# Patient Record
Sex: Female | Born: 1945
Health system: Southern US, Community
[De-identification: ages and names within clinical notes are randomized; demographics above are authoritative.]

## PROBLEM LIST (undated history)

## (undated) DIAGNOSIS — C259 Malignant neoplasm of pancreas, unspecified: Secondary | ICD-10-CM

## (undated) DIAGNOSIS — R112 Nausea with vomiting, unspecified: Secondary | ICD-10-CM

## (undated) DIAGNOSIS — T4145XA Adverse effect of unspecified anesthetic, initial encounter: Secondary | ICD-10-CM

## (undated) DIAGNOSIS — E119 Type 2 diabetes mellitus without complications: Secondary | ICD-10-CM

## (undated) DIAGNOSIS — C7800 Secondary malignant neoplasm of unspecified lung: Secondary | ICD-10-CM

## (undated) DIAGNOSIS — Z8719 Personal history of other diseases of the digestive system: Secondary | ICD-10-CM

## (undated) DIAGNOSIS — I10 Essential (primary) hypertension: Secondary | ICD-10-CM

## (undated) DIAGNOSIS — T8859XA Other complications of anesthesia, initial encounter: Secondary | ICD-10-CM

## (undated) DIAGNOSIS — Z8711 Personal history of peptic ulcer disease: Secondary | ICD-10-CM

## (undated) DIAGNOSIS — N390 Urinary tract infection, site not specified: Secondary | ICD-10-CM

## (undated) DIAGNOSIS — Z9889 Other specified postprocedural states: Secondary | ICD-10-CM

## (undated) HISTORY — DX: Personal history of peptic ulcer disease: Z87.11

## (undated) HISTORY — DX: Secondary malignant neoplasm of unspecified lung: C78.00

## (undated) HISTORY — PX: BUNIONECTOMY: SHX129

## (undated) HISTORY — DX: Personal history of other diseases of the digestive system: Z87.19

## (undated) HISTORY — PX: DENTAL SURGERY: SHX609

## (undated) HISTORY — DX: Malignant neoplasm of pancreas, unspecified: C25.9

## (undated) HISTORY — DX: Urinary tract infection, site not specified: N39.0

## (undated) HISTORY — DX: Essential (primary) hypertension: I10

---

## 2012-01-01 LAB — HM DEXA SCAN

## 2012-01-01 LAB — HM MAMMOGRAPHY: HM Mammogram: NORMAL

## 2013-10-18 ENCOUNTER — Telehealth: Payer: Self-pay

## 2013-10-18 NOTE — Telephone Encounter (Signed)
Left message for call back Non identifiable  

## 2013-10-19 ENCOUNTER — Ambulatory Visit (INDEPENDENT_AMBULATORY_CARE_PROVIDER_SITE_OTHER): Payer: Medicare Other | Admitting: Family Medicine

## 2013-10-19 ENCOUNTER — Encounter: Payer: Self-pay | Admitting: Family Medicine

## 2013-10-19 VITALS — BP 148/84 | HR 82 | Temp 98.2°F | Resp 16 | Ht 60.0 in | Wt 150.2 lb

## 2013-10-19 DIAGNOSIS — IMO0001 Reserved for inherently not codable concepts without codable children: Secondary | ICD-10-CM

## 2013-10-19 DIAGNOSIS — G8929 Other chronic pain: Secondary | ICD-10-CM

## 2013-10-19 DIAGNOSIS — K219 Gastro-esophageal reflux disease without esophagitis: Secondary | ICD-10-CM

## 2013-10-19 DIAGNOSIS — R030 Elevated blood-pressure reading, without diagnosis of hypertension: Secondary | ICD-10-CM

## 2013-10-19 DIAGNOSIS — M549 Dorsalgia, unspecified: Secondary | ICD-10-CM

## 2013-10-19 LAB — BASIC METABOLIC PANEL
BUN: 18 mg/dL (ref 6–23)
CO2: 27 mEq/L (ref 19–32)
Calcium: 9.6 mg/dL (ref 8.4–10.5)
Chloride: 106 mEq/L (ref 96–112)
Creatinine, Ser: 0.6 mg/dL (ref 0.4–1.2)
GFR: 119.24 mL/min (ref >60.00)
Glucose, Bld: 98 mg/dL (ref 70–99)
Potassium: 3.7 mEq/L (ref 3.5–5.1)
Sodium: 141 mEq/L (ref 135–145)

## 2013-10-19 LAB — CBC WITH DIFFERENTIAL/PLATELET
Basophils Relative: 0.4 % (ref 0.0–3.0)
Eosinophils Relative: 0.9 % (ref 0.0–5.0)
HCT: 37.8 % (ref 36.0–46.0)
Hemoglobin: 12.4 g/dL (ref 12.0–15.0)
Lymphocytes Relative: 39.7 % (ref 12.0–46.0)
Lymphs Abs: 3.5 10*3/uL (ref 0.7–4.0)
MCHC: 32.8 g/dL (ref 30.0–36.0)
Monocytes Relative: 9.6 % (ref 3.0–12.0)
Neutrophils Relative %: 49.4 % (ref 43.0–77.0)
Platelets: 292 10*3/uL (ref 150.0–400.0)
RBC: 5.1 Mil/uL (ref 3.87–5.11)
WBC: 8.8 10*3/uL (ref 4.5–10.5)

## 2013-10-19 LAB — LIPID PANEL
HDL: 45.6 mg/dL (ref >39.00)
Triglycerides: 223 mg/dL — ABNORMAL HIGH (ref 0.0–149.0)
VLDL: 44.6 mg/dL — ABNORMAL HIGH (ref 0.0–40.0)

## 2013-10-19 LAB — HEPATIC FUNCTION PANEL
ALT: 26 U/L (ref 0–35)
AST: 24 U/L (ref 0–37)
Albumin: 4.1 g/dL (ref 3.5–5.2)
Alkaline Phosphatase: 89 U/L (ref 39–117)
Total Bilirubin: 0.5 mg/dL (ref 0.3–1.2)

## 2013-10-19 LAB — LDL CHOLESTEROL, DIRECT: Direct LDL: 223.9 mg/dL

## 2013-10-19 LAB — H. PYLORI ANTIBODY, IGG: H Pylori IgG: NEGATIVE

## 2013-10-19 MED ORDER — PANTOPRAZOLE SODIUM 40 MG PO TBEC: 40.0000 mg | DELAYED_RELEASE_TABLET | Freq: Every day | ORAL | Status: DC
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
## 2013-10-19 NOTE — Progress Notes (Signed)
Pre visit review using our clinic review tool, if applicable. No additional management support is needed unless otherwise documented below in the visit note. 

## 2013-10-19 NOTE — Telephone Encounter (Signed)
Unable to reach pre visit.  
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
## 2013-10-19 NOTE — Assessment & Plan Note (Signed)
New.  Start Protonix daily to improve sxs.  Reviewed lifestyle modifications- particularly avoidance of eating and then lying down.  Check H pylori and tx prn.

## 2013-10-19 NOTE — Assessment & Plan Note (Signed)
New.  Pt's BP today places her in hypertensive range but it is first visit and pt admits to being nervous.  Will not label her hypertensive based on 1 reading but will follow closely and if again elevated, will have low threshold for starting meds.  Reviewed supportive care and red flags that should prompt return.  Pt expressed understanding and is in agreement w/ plan.

## 2013-10-19 NOTE — Patient Instructions (Signed)
Schedule your complete physical in 6 months We'll notify you of your lab results and make any changes if needed We'll call you with your Ortho appt Start the protonix daily for the reflux Call with any questions or concerns Happy Holidays!

## 2013-10-20 ENCOUNTER — Other Ambulatory Visit: Payer: Self-pay | Admitting: General Practice

## 2013-10-20 DIAGNOSIS — E785 Hyperlipidemia, unspecified: Secondary | ICD-10-CM

## 2013-10-20 MED ORDER — ROSUVASTATIN CALCIUM 20 MG PO TABS: 20.0000 mg | ORAL_TABLET | Freq: Every day | ORAL | Status: DC

## 2013-10-24 ENCOUNTER — Encounter: Payer: Self-pay | Admitting: General Practice

## 2013-11-28 ENCOUNTER — Encounter: Payer: Self-pay | Admitting: Family Medicine

## 2013-11-28 ENCOUNTER — Ambulatory Visit (INDEPENDENT_AMBULATORY_CARE_PROVIDER_SITE_OTHER): Payer: Medicare Other | Admitting: Family Medicine

## 2013-11-28 VITALS — BP 140/84 | HR 94 | Temp 98.2°F | Resp 16 | Wt 150.4 lb

## 2013-11-28 DIAGNOSIS — J309 Allergic rhinitis, unspecified: Secondary | ICD-10-CM

## 2013-11-28 NOTE — Patient Instructions (Signed)
Follow up as needed Drink plenty of fluids Use a humidifier at night Start OTC Claritin or Zyrtec daily Add Nasacort- 2 sprays each nostril daily- also OTC Call the housing authority and report the mold Call with any questions or concerns Hang in there!

## 2013-11-28 NOTE — Progress Notes (Signed)
   Subjective:    Patient ID: Jacqueline Hahn, female    DOB: 26-Nov-1945, 68 y.o.   MRN: [id number]  HPI Allergic rhinitis- + nasal congestion, bilateral ear fullness, sore throat at night, PND.  No fevers.  Waking very dry.  No hx of allergies.  Now w/ mildew exposure at home- sxs only occur when at home.   Review of Systems For ROS see HPI     Objective:   Physical Exam  Vitals reviewed. Constitutional: She appears well-developed and well-nourished. No distress.  HENT:  Head: Normocephalic and atraumatic.  Right Ear: Tympanic membrane normal.  Left Ear: Tympanic membrane normal.  Nose: Mucosal edema and rhinorrhea present. Right sinus exhibits no maxillary sinus tenderness and no frontal sinus tenderness. Left sinus exhibits no maxillary sinus tenderness and no frontal sinus tenderness.  Mouth/Throat: Mucous membranes are normal. Posterior oropharyngeal erythema (w/ PND) present.  Eyes: Conjunctivae and EOM are normal. Pupils are equal, round, and reactive to light.  Neck: Normal range of motion. Neck supple.  Cardiovascular: Normal rate, regular rhythm and normal heart sounds.   Pulmonary/Chest: Effort normal and breath sounds normal. No respiratory distress. She has no wheezes. She has no rales.  Lymphadenopathy:    She has no cervical adenopathy.          Assessment & Plan:

## 2013-11-28 NOTE — Progress Notes (Signed)
Pre visit review using our clinic review tool, if applicable. No additional management support is needed unless otherwise documented below in the visit note. 

## 2013-11-28 NOTE — Assessment & Plan Note (Signed)
New.  sxs and PE consistent w/ allergy exposure.  Start OTC antihistamine and nasal steroid.  Reviewed supportive care and red flags that should prompt return.  Pt expressed understanding and is in agreement w/ plan.

## 2014-01-02 ENCOUNTER — Encounter: Payer: Self-pay | Admitting: Family Medicine

## 2014-01-18 ENCOUNTER — Encounter (HOSPITAL_COMMUNITY): Payer: Self-pay | Admitting: Emergency Medicine

## 2014-01-18 ENCOUNTER — Emergency Department (HOSPITAL_COMMUNITY): Payer: Medicare Other

## 2014-01-18 ENCOUNTER — Emergency Department (HOSPITAL_COMMUNITY)
Admission: EM | Admit: 2014-01-18 | Discharge: 2014-01-18 | Disposition: A | Discharge status: Home / Self Care | Payer: Medicare Other | Attending: Emergency Medicine | Admitting: Emergency Medicine

## 2014-01-18 DIAGNOSIS — S99919A Unspecified injury of unspecified ankle, initial encounter: Secondary | ICD-10-CM

## 2014-01-18 DIAGNOSIS — S99929A Unspecified injury of unspecified foot, initial encounter: Secondary | ICD-10-CM

## 2014-01-18 DIAGNOSIS — M549 Dorsalgia, unspecified: Secondary | ICD-10-CM

## 2014-01-18 DIAGNOSIS — Z8719 Personal history of other diseases of the digestive system: Secondary | ICD-10-CM | POA: Insufficient documentation

## 2014-01-18 DIAGNOSIS — S8991XA Unspecified injury of right lower leg, initial encounter: Secondary | ICD-10-CM

## 2014-01-18 DIAGNOSIS — W19XXXA Unspecified fall, initial encounter: Secondary | ICD-10-CM

## 2014-01-18 DIAGNOSIS — W06XXXA Fall from bed, initial encounter: Secondary | ICD-10-CM | POA: Insufficient documentation

## 2014-01-18 DIAGNOSIS — Y9389 Activity, other specified: Secondary | ICD-10-CM | POA: Insufficient documentation

## 2014-01-18 DIAGNOSIS — I10 Essential (primary) hypertension: Secondary | ICD-10-CM | POA: Insufficient documentation

## 2014-01-18 DIAGNOSIS — S8990XA Unspecified injury of unspecified lower leg, initial encounter: Secondary | ICD-10-CM | POA: Insufficient documentation

## 2014-01-18 DIAGNOSIS — Z87440 Personal history of urinary (tract) infections: Secondary | ICD-10-CM | POA: Insufficient documentation

## 2014-01-18 DIAGNOSIS — Z7982 Long term (current) use of aspirin: Secondary | ICD-10-CM | POA: Insufficient documentation

## 2014-01-18 DIAGNOSIS — S79919A Unspecified injury of unspecified hip, initial encounter: Secondary | ICD-10-CM | POA: Insufficient documentation

## 2014-01-18 DIAGNOSIS — S79912A Unspecified injury of left hip, initial encounter: Secondary | ICD-10-CM

## 2014-01-18 DIAGNOSIS — Z79899 Other long term (current) drug therapy: Secondary | ICD-10-CM | POA: Insufficient documentation

## 2014-01-18 DIAGNOSIS — Z87891 Personal history of nicotine dependence: Secondary | ICD-10-CM | POA: Insufficient documentation

## 2014-01-18 DIAGNOSIS — Y929 Unspecified place or not applicable: Secondary | ICD-10-CM | POA: Insufficient documentation

## 2014-01-18 DIAGNOSIS — IMO0002 Reserved for concepts with insufficient information to code with codable children: Secondary | ICD-10-CM | POA: Insufficient documentation

## 2014-01-18 DIAGNOSIS — S79929A Unspecified injury of unspecified thigh, initial encounter: Secondary | ICD-10-CM

## 2014-01-18 MED ORDER — IBUPROFEN 800 MG PO TABS: 800.0000 mg | ORAL_TABLET | Freq: Three times a day (TID) | ORAL | Status: DC

## 2014-01-18 MED ORDER — IBUPROFEN 800 MG PO TABS
800.0000 mg | ORAL_TABLET | Freq: Once | ORAL | Status: AC
  Administered 2014-01-18: 800 mg via ORAL
  Filled 2014-01-18 (×2): qty 1

## 2014-01-18 NOTE — Discharge Instructions (Signed)
Take ibuprofen as needed for pain. Refer to attached documents for more information. Apply ice to your injuries for pain relief.

## 2014-01-18 NOTE — ED Notes (Signed)
Pt c/o low back pain, l/hip pain and leg pain. Denies dizziness or LOC. Pt was standing on a bed and lost her balance

## 2014-01-18 NOTE — ED Provider Notes (Signed)
CSN: [id number]     Arrival date & time 01/18/14  1702 History  This chart was scribed for Alvina Chou, PA working with Arbie Cookey, MD by Roxan Diesel, ED Scribe. This patient was seen in room WTR7/WTR7 and the patient's care was started at 6:45 PM.   Chief Complaint  Patient presents with  . Back Pain  . Fall  . Leg Pain    l/hip pain    The history is provided by the patient. No language interpreter was used.    HPI Comments: Jacqueline Hahn is a 68 y.o. female who presents to the Emergency Department complaining of a fall that occurred earlier today.  Pt reports she was standing on her bed and accidentally stepped off.  She does not know how she landed but states that she heard a "crack" when she landed.  She denies head impact or LOC.  Currently she complains of constant moderate pain to her back, left hip and right leg.  She denies any CP or dizziness before her fall.  She has not attempted to treat pain pta.  She is able to walk.    Past Medical History  Diagnosis Date  . Hypertension   . UTI (urinary tract infection)   . History of stomach ulcers     Past Surgical History  Procedure Laterality Date  . Dental surgery    . Bunionectomy      Family History  Problem Relation Age of Onset  . Heart attack Mother   . Stroke Mother   . Heart attack Father   . Hyperlipidemia Sister   . Hypertension Sister   . Hypertension Brother   . Alcohol abuse Brother     History  Substance Use Topics  . Smoking status: Former Research scientist (life sciences)  . Smokeless tobacco: Never Used  . Alcohol Use: No    OB History   Grav Para Term Preterm Abortions TAB SAB Ect Mult Living                   Review of Systems  Constitutional: Negative for fever, chills and fatigue.  HENT: Negative for trouble swallowing.   Eyes: Negative for visual disturbance.  Respiratory: Negative for shortness of breath.   Cardiovascular: Negative for chest pain and palpitations.  Gastrointestinal:  Negative for nausea, vomiting, abdominal pain and diarrhea.  Genitourinary: Negative for dysuria and difficulty urinating.  Musculoskeletal: Positive for arthralgias and back pain. Negative for neck pain.  Skin: Negative for color change.  Neurological: Negative for dizziness and weakness.  Psychiatric/Behavioral: Negative for dysphoric mood.  All other systems reviewed and are negative.      Allergies  Review of patient's allergies indicates no known allergies.  Home Medications   Current Outpatient Rx  Name  Route  Sig  Dispense  Refill  . aspirin 325 MG tablet   Oral   Take 325 mg by mouth every 4 (four) hours as needed (pain).         Marland Kitchen lansoprazole (PREVACID) 15 MG capsule   Oral   Take 15 mg by mouth daily at 12 noon.         . naproxen (NAPROSYN) 375 MG tablet   Oral   Take 375 mg by mouth 2 (two) times daily as needed (pain).          BP 169/82  Pulse 99  Temp(Src) 98.3 F (36.8 C)  Resp 18  SpO2 100%  Physical Exam  Nursing note and vitals reviewed.  Constitutional: She is oriented to person, place, and time. She appears well-developed and well-nourished. No distress.  HENT:  Head: Normocephalic and atraumatic.  Eyes: EOM are normal. Pupils are equal, round, and reactive to light.  Neck: Neck supple. No tracheal deviation present.  Cardiovascular: Normal rate.   Pulmonary/Chest: Effort normal. No respiratory distress.  Musculoskeletal:       Left hip: She exhibits decreased range of motion and tenderness. She exhibits no deformity.       Right upper leg: She exhibits tenderness. She exhibits no deformity.  No midline spine tenderness to palpation.   Left anterior hip tenderness to palpation.  ROM slightly limited due to pain.  No obvious deformity. Right distal medial thigh tender to palpation, without obvious deformity.  Neurological: She is alert and oriented to person, place, and time.  Skin: Skin is warm and dry.  Psychiatric: She has a normal  mood and affect. Her behavior is normal.    ED Course  Procedures (including critical care time)  DIAGNOSTIC STUDIES: Oxygen Saturation is 100% on room air, normal by my interpretation.    COORDINATION OF CARE: 6:47 PM-Discussed treatment plan which includes pain medication and ice with pt at bedside and pt agreed to plan.     Labs Review Labs Reviewed - No data to display  Imaging Review Dg Lumbar Spine Complete  01/18/2014   CLINICAL DATA:  Fall with low back pain  EXAM: LUMBAR SPINE - COMPLETE 4+ VIEW  COMPARISON:  None.  FINDINGS: No acute fracture suspected. Decreased height of the L5 body is likely developmental. No subluxation. No significant disc narrowing.  Calcifications overlapping the bilateral upper quadrants do not clearly localize to the solid viscera.  IMPRESSION: No acute osseous findings.   Electronically Signed   By: Jorje Guild M.D.   On: 01/18/2014 18:38   Dg Pelvis 1-2 Views  01/18/2014   CLINICAL DATA:  Fall with left hip pain  EXAM: PELVIS - 1-2 VIEW  COMPARISON:  None.  FINDINGS: There is no evidence of pelvic fracture or diastasis. No significant joint narrowing. Osteopenia. Moderate volume of formed stool in the lower abdomen.  IMPRESSION: No acute osseous findings.   Electronically Signed   By: Jorje Guild M.D.   On: 01/18/2014 18:34   Dg Femur Right  01/18/2014   CLINICAL DATA:  Fall with right leg pain from hip to knee.  EXAM: RIGHT FEMUR - 2 VIEW  COMPARISON:  None.  FINDINGS: There is no evidence of fracture or other focal bone lesions. Soft tissues are unremarkable.  IMPRESSION: Negative.   Electronically Signed   By: Jorje Guild M.D.   On: 01/18/2014 18:35     EKG Interpretation None      MDM   Final diagnoses:  Fall  Back pain  Injury of left hip  Injury of right leg    I personally performed the services described in this documentation, which was scribed in my presence. The recorded information has been reviewed and is  accurate.     6:51 PM Xrays unremarkable for acute changes. Patient will have ibuprofen for pain. No other injuries. Vitals stable and patient afebrile.    Alvina Chou, [place] 01/18/14 [phone]

## 2014-01-19 NOTE — ED Provider Notes (Signed)
Medical screening examination/treatment/procedure(s) were performed by non-physician practitioner and as supervising physician I was immediately available for consultation/collaboration.   Houston Siren III, MD 01/19/14 1322

## 2014-04-18 ENCOUNTER — Telehealth: Payer: Self-pay

## 2014-04-18 NOTE — Telephone Encounter (Signed)
Medication and allergies:  Reviewed and updated  90 day supply/mail order: n/a Local pharmacy:  [facility] # [place], [place]   Immunizations due:  Tdap, PNA, and Shingles   A/P: No changes to personal, family history or past surgical hx PAP- 01/01/12- normal CCS- has never had one MMG- 01/2012- normal per patient Tdap- unsure of when last received PNA- has never had one Shingles- has never had one  To Discuss with Provider: Pt fell back in March of this year and it "bothers" her at night.

## 2014-04-18 NOTE — Telephone Encounter (Signed)
Pt was taken off the schedule for her appointment tomorrow due to a mix up.  Pt was called and informed that we could re-add her to the schedule.  Pt was agreeable, but was a little agitated.  Apologizes were offered and patient stated that she would see [place] tomorrow.

## 2014-04-19 ENCOUNTER — Ambulatory Visit (INDEPENDENT_AMBULATORY_CARE_PROVIDER_SITE_OTHER): Payer: Medicare Other | Admitting: Family Medicine

## 2014-04-19 ENCOUNTER — Encounter: Payer: Self-pay | Admitting: Family Medicine

## 2014-04-19 ENCOUNTER — Encounter: Payer: Medicare Other | Admitting: Family Medicine

## 2014-04-19 ENCOUNTER — Ambulatory Visit: Payer: Medicare Other

## 2014-04-19 VITALS — BP 142/88 | HR 101 | Temp 98.3°F | Resp 16 | Ht 61.0 in | Wt 146.4 lb

## 2014-04-19 DIAGNOSIS — IMO0001 Reserved for inherently not codable concepts without codable children: Secondary | ICD-10-CM

## 2014-04-19 DIAGNOSIS — E2839 Other primary ovarian failure: Secondary | ICD-10-CM

## 2014-04-19 DIAGNOSIS — R221 Localized swelling, mass and lump, neck: Secondary | ICD-10-CM

## 2014-04-19 DIAGNOSIS — H539 Unspecified visual disturbance: Secondary | ICD-10-CM

## 2014-04-19 DIAGNOSIS — M549 Dorsalgia, unspecified: Secondary | ICD-10-CM

## 2014-04-19 DIAGNOSIS — R7989 Other specified abnormal findings of blood chemistry: Secondary | ICD-10-CM

## 2014-04-19 DIAGNOSIS — R7309 Other abnormal glucose: Secondary | ICD-10-CM

## 2014-04-19 DIAGNOSIS — R220 Localized swelling, mass and lump, head: Secondary | ICD-10-CM

## 2014-04-19 DIAGNOSIS — E139 Other specified diabetes mellitus without complications: Secondary | ICD-10-CM

## 2014-04-19 DIAGNOSIS — R030 Elevated blood-pressure reading, without diagnosis of hypertension: Secondary | ICD-10-CM

## 2014-04-19 DIAGNOSIS — O24919 Unspecified diabetes mellitus in pregnancy, unspecified trimester: Secondary | ICD-10-CM

## 2014-04-19 DIAGNOSIS — G8929 Other chronic pain: Secondary | ICD-10-CM

## 2014-04-19 DIAGNOSIS — Z1211 Encounter for screening for malignant neoplasm of colon: Secondary | ICD-10-CM

## 2014-04-19 DIAGNOSIS — E089 Diabetes mellitus due to underlying condition without complications: Secondary | ICD-10-CM

## 2014-04-19 DIAGNOSIS — Z0000 Encounter for general adult medical examination without abnormal findings: Secondary | ICD-10-CM

## 2014-04-19 DIAGNOSIS — Z1231 Encounter for screening mammogram for malignant neoplasm of breast: Secondary | ICD-10-CM

## 2014-04-19 LAB — T4, FREE: FREE T4: 0.89 ng/dL (ref 0.60–1.60)

## 2014-04-19 LAB — LIPID PANEL
Cholesterol: 282 mg/dL — ABNORMAL HIGH (ref 0–200)
HDL: 46.5 mg/dL (ref >39.00)
LDL Cholesterol: 198 mg/dL — ABNORMAL HIGH (ref 0–99)
NONHDL: 235.5
TRIGLYCERIDES: 186 mg/dL — AB (ref 0.0–149.0)
Total CHOL/HDL Ratio: 6
VLDL: 37.2 mg/dL (ref 0.0–40.0)

## 2014-04-19 LAB — BASIC METABOLIC PANEL
BUN: 15 mg/dL (ref 6–23)
CHLORIDE: 103 meq/L (ref 96–112)
CO2: 29 mEq/L (ref 19–32)
CREATININE: 0.7 mg/dL (ref 0.4–1.2)
Calcium: 10 mg/dL (ref 8.4–10.5)
GFR: 100.69 mL/min (ref >60.00)
Glucose, Bld: 121 mg/dL — ABNORMAL HIGH (ref 70–99)
Potassium: 4 mEq/L (ref 3.5–5.1)
Sodium: 138 mEq/L (ref 135–145)

## 2014-04-19 LAB — CBC WITH DIFFERENTIAL/PLATELET
BASOS PCT: 0.4 % (ref 0.0–3.0)
Basophils Absolute: 0 10*3/uL (ref 0.0–0.1)
EOS PCT: 1.6 % (ref 0.0–5.0)
Eosinophils Absolute: 0.2 10*3/uL (ref 0.0–0.7)
HEMATOCRIT: 38.2 % (ref 36.0–46.0)
Hemoglobin: 12.4 g/dL (ref 12.0–15.0)
LYMPHS ABS: 4.3 10*3/uL — AB (ref 0.7–4.0)
Lymphocytes Relative: 43.3 % (ref 12.0–46.0)
MCHC: 32.3 g/dL (ref 30.0–36.0)
MCV: 73.7 fl — ABNORMAL LOW (ref 78.0–100.0)
MONO ABS: 1 10*3/uL (ref 0.1–1.0)
MONOS PCT: 9.7 % (ref 3.0–12.0)
Neutro Abs: 4.4 10*3/uL (ref 1.4–7.7)
Neutrophils Relative %: 45 % (ref 43.0–77.0)
PLATELETS: 353 10*3/uL (ref 150.0–400.0)
RBC: 5.18 Mil/uL — ABNORMAL HIGH (ref 3.87–5.11)
RDW: 14.8 % (ref 11.5–15.5)
WBC: 9.9 10*3/uL (ref 4.0–10.5)

## 2014-04-19 LAB — TSH
TSH: 0.31 u[IU]/mL — AB (ref 0.35–4.50)
TSH: 0.57 u[IU]/mL (ref 0.35–4.50)

## 2014-04-19 LAB — T3, FREE: T3, Free: 3 pg/mL (ref 2.3–4.2)

## 2014-04-19 LAB — HEPATIC FUNCTION PANEL
ALT: 22 U/L (ref 0–35)
AST: 27 U/L (ref 0–37)
Albumin: 4 g/dL (ref 3.5–5.2)
Alkaline Phosphatase: 104 U/L (ref 39–117)
BILIRUBIN TOTAL: 0.2 mg/dL (ref 0.2–1.2)
Bilirubin, Direct: 0 mg/dL (ref 0.0–0.3)
Total Protein: 7.9 g/dL (ref 6.0–8.3)

## 2014-04-19 LAB — HEMOGLOBIN A1C: HEMOGLOBIN A1C: 8.3 % — AB (ref 4.6–6.5)

## 2014-04-19 NOTE — Patient Instructions (Signed)
Follow up in 6 months to recheck BP Keep up the good work on healthy diet and regular exercise We'll notify you of your GI, Ortho, Derm, Eye appts We'll call you with your mammo and bone density appt We'll notify you of your lab results and make any changes if needed Call with any questions or concerns Have a great summer!

## 2014-04-19 NOTE — Addendum Note (Signed)
Addended by: Peggyann Shoals on: 04/19/2014 03:47 PM   Modules accepted: Orders

## 2014-04-19 NOTE — Progress Notes (Signed)
   Subjective:    Patient ID: Jacqueline Hahn, female    DOB: 1946/08/06, 68 y.o.   MRN: [id number]  HPI Here today for CPE.  Risk Factors: Elevated BP- pt has stopped caffeine intake, started walking.  Is attempting to avoid BP medication.  No CP, SOB, HAs, visual changes, edema. L temple soft tissue- not painful, pt feels it is enlarging.  1st noticed 12-18 months ago. Back pain- chronic problem for pt, worsened in March after fall at home.  Pain is improving but still having difficulty lying on R side.  Pain will occasionally radiate into R leg. Physical Activity: Walking regularly 1-2 miles/day Fall Risk: low Depression: denies current sxs Hearing: normal to conversational tones and whispered voice at 6 ft ADL's: independent Cognitive: normal linear thought process, memory and attention intact Home Safety: safe at home, lives alone Height, Weight, BMI, Visual Acuity: see vitals, vision corrected to 20/20 w/ glasses Counseling: due for mammo, DEXA, colonoscopy.  UTD on pap. Labs Ordered: See A&P Care Plan: See A&P    Review of Systems Patient reports no vision/hearing changes, adenopathy,fever, weight change,  persistant/recurrent hoarseness , swallowing issues, chest pain, palpitations, edema, persistant/recurrent cough, hemoptysis, dyspnea (rest/exertional/paroxysmal nocturnal), gastrointestinal bleeding (melena, rectal bleeding), abdominal pain, significant heartburn, bowel changes, GU symptoms (dysuria, hematuria, incontinence), Gyn symptoms (abnormal  bleeding, pain),  syncope, focal weakness, memory loss, numbness & tingling, skin/hair/nail changes, abnormal bruising or bleeding, anxiety, or depression.     Objective:   Physical Exam General Appearance:    Alert, cooperative, no distress, appears stated age  Head:    Normocephalic, L temporal soft tissue mass consistent w/ cyst  Eyes:    PERRL, conjunctiva/corneas clear, EOM's intact, fundi    benign, both eyes  Ears:    Normal  TM's and external ear canals, both ears  Nose:   Nares normal, septum midline, mucosa normal, no drainage    or sinus tenderness  Throat:   Lips, mucosa, and tongue normal; teeth and gums normal  Neck:   Supple, symmetrical, trachea midline, no adenopathy;    Thyroid: no enlargement/tenderness/nodules  Back:     Symmetric, no curvature, ROM normal, no CVA tenderness  Lungs:     Clear to auscultation bilaterally, respirations unlabored  Chest Wall:    No tenderness or deformity   Heart:    Regular rate and rhythm, S1 and S2 normal, no murmur, rub   or gallop  Breast Exam:    Deferred to mammo  Abdomen:     Soft, non-tender, bowel sounds active all four quadrants,    no masses, no organomegaly  Genitalia:    Deferred   Rectal:    Extremities:   Extremities normal, atraumatic, no cyanosis or edema  Pulses:   2+ and symmetric all extremities  Skin:   Skin color, texture, turgor normal, no rashes or lesions  Lymph nodes:   Cervical, supraclavicular, and axillary nodes normal  Neurologic:   CNII-XII intact, normal strength, sensation and reflexes    throughout          Assessment & Plan:

## 2014-04-19 NOTE — Progress Notes (Signed)
Pre visit review using our clinic review tool, if applicable. No additional management support is needed unless otherwise documented below in the visit note. 

## 2014-04-19 NOTE — Assessment & Plan Note (Signed)
Pt's PE WNL.  Overdue on health maintenance- referred for mammo, DEXA, colonoscopy, routine eye exam.  Check labs.  Anticipatory guidance provided.

## 2014-04-19 NOTE — Assessment & Plan Note (Signed)
Chronic problem but worsening recently w/ fall.  Pt asking for orthopedic referral for additional evaluation.

## 2014-04-19 NOTE — Assessment & Plan Note (Signed)
New.  L sided, temporal soft tissue mass consistent w/ cyst.  No TTP, no overlying redness, no drainage.  Will refer to derm for evaluation and possible removal.

## 2014-04-19 NOTE — Assessment & Plan Note (Signed)
BP has improved since last visit.  Applauded pt's efforts at healthy diet and regular exercise.  Will continue to follow.

## 2014-04-20 MED ORDER — METFORMIN HCL 500 MG PO TABS: ORAL_TABLET | ORAL | Status: DC

## 2014-04-25 ENCOUNTER — Encounter: Payer: Self-pay | Admitting: Internal Medicine

## 2014-05-10 ENCOUNTER — Ambulatory Visit
Admission: RE | Admit: 2014-05-10 | Discharge: 2014-05-10 | Disposition: A | Discharge status: Home / Self Care | Payer: Medicare Other | Source: Ambulatory Visit | Attending: Family Medicine | Admitting: Family Medicine

## 2014-05-10 DIAGNOSIS — Z1231 Encounter for screening mammogram for malignant neoplasm of breast: Secondary | ICD-10-CM

## 2014-06-07 ENCOUNTER — Other Ambulatory Visit: Payer: Medicare Other

## 2014-06-22 ENCOUNTER — Emergency Department (HOSPITAL_COMMUNITY): Payer: Medicare Other

## 2014-06-22 ENCOUNTER — Inpatient Hospital Stay (HOSPITAL_COMMUNITY)
Admission: EM | Admit: 2014-06-22 | Discharge: 2014-06-25 | DRG: 435 | Disposition: A | Discharge status: Home / Self Care | Payer: Medicare Other | Attending: Internal Medicine | Admitting: Internal Medicine

## 2014-06-22 ENCOUNTER — Encounter (HOSPITAL_COMMUNITY): Payer: Self-pay | Admitting: Emergency Medicine

## 2014-06-22 DIAGNOSIS — K831 Obstruction of bile duct: Secondary | ICD-10-CM

## 2014-06-22 DIAGNOSIS — Z6379 Other stressful life events affecting family and household: Secondary | ICD-10-CM

## 2014-06-22 DIAGNOSIS — Z8249 Family history of ischemic heart disease and other diseases of the circulatory system: Secondary | ICD-10-CM | POA: Diagnosis not present

## 2014-06-22 DIAGNOSIS — M549 Dorsalgia, unspecified: Secondary | ICD-10-CM

## 2014-06-22 DIAGNOSIS — I10 Essential (primary) hypertension: Secondary | ICD-10-CM | POA: Diagnosis present

## 2014-06-22 DIAGNOSIS — Z823 Family history of stroke: Secondary | ICD-10-CM | POA: Diagnosis not present

## 2014-06-22 DIAGNOSIS — C250 Malignant neoplasm of head of pancreas: Principal | ICD-10-CM | POA: Diagnosis present

## 2014-06-22 DIAGNOSIS — Z87891 Personal history of nicotine dependence: Secondary | ICD-10-CM

## 2014-06-22 DIAGNOSIS — C7800 Secondary malignant neoplasm of unspecified lung: Secondary | ICD-10-CM | POA: Diagnosis present

## 2014-06-22 DIAGNOSIS — D649 Anemia, unspecified: Secondary | ICD-10-CM | POA: Diagnosis present

## 2014-06-22 DIAGNOSIS — R220 Localized swelling, mass and lump, head: Secondary | ICD-10-CM

## 2014-06-22 DIAGNOSIS — IMO0001 Reserved for inherently not codable concepts without codable children: Secondary | ICD-10-CM

## 2014-06-22 DIAGNOSIS — R740 Nonspecific elevation of levels of transaminase and lactic acid dehydrogenase [LDH]: Secondary | ICD-10-CM

## 2014-06-22 DIAGNOSIS — R7402 Elevation of levels of lactic acid dehydrogenase (LDH): Secondary | ICD-10-CM

## 2014-06-22 DIAGNOSIS — E119 Type 2 diabetes mellitus without complications: Secondary | ICD-10-CM | POA: Diagnosis present

## 2014-06-22 DIAGNOSIS — R030 Elevated blood-pressure reading, without diagnosis of hypertension: Secondary | ICD-10-CM

## 2014-06-22 DIAGNOSIS — C259 Malignant neoplasm of pancreas, unspecified: Secondary | ICD-10-CM

## 2014-06-22 DIAGNOSIS — K219 Gastro-esophageal reflux disease without esophagitis: Secondary | ICD-10-CM | POA: Diagnosis present

## 2014-06-22 DIAGNOSIS — Z0000 Encounter for general adult medical examination without abnormal findings: Secondary | ICD-10-CM

## 2014-06-22 DIAGNOSIS — Z79899 Other long term (current) drug therapy: Secondary | ICD-10-CM | POA: Diagnosis not present

## 2014-06-22 DIAGNOSIS — R932 Abnormal findings on diagnostic imaging of liver and biliary tract: Secondary | ICD-10-CM

## 2014-06-22 DIAGNOSIS — R7401 Elevation of levels of liver transaminase levels: Secondary | ICD-10-CM | POA: Diagnosis present

## 2014-06-22 DIAGNOSIS — R1011 Right upper quadrant pain: Secondary | ICD-10-CM | POA: Diagnosis present

## 2014-06-22 DIAGNOSIS — K8689 Other specified diseases of pancreas: Secondary | ICD-10-CM

## 2014-06-22 DIAGNOSIS — G8929 Other chronic pain: Secondary | ICD-10-CM

## 2014-06-22 HISTORY — DX: Type 2 diabetes mellitus without complications: E11.9

## 2014-06-22 LAB — CBC WITH DIFFERENTIAL/PLATELET
Basophils Absolute: 0 10*3/uL (ref 0.0–0.1)
Basophils Relative: 0 % (ref 0–1)
EOS PCT: 2 % (ref 0–5)
Eosinophils Absolute: 0.1 10*3/uL (ref 0.0–0.7)
HEMATOCRIT: 40.5 % (ref 36.0–46.0)
Hemoglobin: 13.3 g/dL (ref 12.0–15.0)
LYMPHS ABS: 3.2 10*3/uL (ref 0.7–4.0)
Lymphocytes Relative: 40 % (ref 12–46)
MCH: 23.9 pg — ABNORMAL LOW (ref 26.0–34.0)
MCHC: 32.8 g/dL (ref 30.0–36.0)
MCV: 72.8 fL — ABNORMAL LOW (ref 78.0–100.0)
Monocytes Absolute: 0.7 10*3/uL (ref 0.1–1.0)
Monocytes Relative: 8 % (ref 3–12)
NEUTROS ABS: 4.1 10*3/uL (ref 1.7–7.7)
Neutrophils Relative %: 50 % (ref 43–77)
PLATELETS: 365 10*3/uL (ref 150–400)
RBC: 5.56 MIL/uL — AB (ref 3.87–5.11)
RDW: 15.2 % (ref 11.5–15.5)
WBC: 8.1 10*3/uL (ref 4.0–10.5)

## 2014-06-22 LAB — URINE MICROSCOPIC-ADD ON

## 2014-06-22 LAB — CBC
HCT: 34 % — ABNORMAL LOW (ref 36.0–46.0)
Hemoglobin: 10.9 g/dL — ABNORMAL LOW (ref 12.0–15.0)
MCH: 23.1 pg — ABNORMAL LOW (ref 26.0–34.0)
MCHC: 32.1 g/dL (ref 30.0–36.0)
MCV: 72 fL — ABNORMAL LOW (ref 78.0–100.0)
Platelets: 320 10*3/uL (ref 150–400)
RBC: 4.72 MIL/uL (ref 3.87–5.11)
RDW: 15.2 % (ref 11.5–15.5)
WBC: 7.6 10*3/uL (ref 4.0–10.5)

## 2014-06-22 LAB — URINALYSIS, ROUTINE W REFLEX MICROSCOPIC
GLUCOSE, UA: NEGATIVE mg/dL
Hgb urine dipstick: NEGATIVE
KETONES UR: NEGATIVE mg/dL
Nitrite: NEGATIVE
PROTEIN: NEGATIVE mg/dL
Specific Gravity, Urine: 1.016 (ref 1.005–1.030)
Urobilinogen, UA: 0.2 mg/dL (ref 0.0–1.0)
pH: 6 (ref 5.0–8.0)

## 2014-06-22 LAB — CREATININE, SERUM
CREATININE: 0.71 mg/dL (ref 0.50–1.10)
GFR calc Af Amer: >90 mL/min (ref >90)
GFR, EST NON AFRICAN AMERICAN: 87 mL/min — AB (ref >90)

## 2014-06-22 LAB — COMPREHENSIVE METABOLIC PANEL
ALT: 683 U/L — ABNORMAL HIGH (ref 0–35)
AST: 405 U/L — AB (ref 0–37)
Albumin: 3.8 g/dL (ref 3.5–5.2)
Alkaline Phosphatase: 595 U/L — ABNORMAL HIGH (ref 39–117)
Anion gap: 14 (ref 5–15)
BUN: 7 mg/dL (ref 6–23)
CHLORIDE: 103 meq/L (ref 96–112)
CO2: 23 meq/L (ref 19–32)
Calcium: 10.3 mg/dL (ref 8.4–10.5)
Creatinine, Ser: 0.63 mg/dL (ref 0.50–1.10)
GLUCOSE: 156 mg/dL — AB (ref 70–99)
Potassium: 4.3 mEq/L (ref 3.7–5.3)
SODIUM: 140 meq/L (ref 137–147)
Total Bilirubin: 3.4 mg/dL — ABNORMAL HIGH (ref 0.3–1.2)
Total Protein: 8 g/dL (ref 6.0–8.3)

## 2014-06-22 LAB — TROPONIN I: Troponin I: <0.3 ng/mL (ref <0.30)

## 2014-06-22 LAB — LIPASE, BLOOD: Lipase: 89 U/L — ABNORMAL HIGH (ref 11–59)

## 2014-06-22 MED ORDER — ONDANSETRON HCL 4 MG/2ML IJ SOLN
4.0000 mg | Freq: Four times a day (QID) | INTRAMUSCULAR | Status: DC | PRN
  Administered 2014-06-23 – 2014-06-24 (×2): 4 mg via INTRAVENOUS
  Filled 2014-06-22 (×2): qty 2

## 2014-06-22 MED ORDER — HYDRALAZINE HCL 20 MG/ML IJ SOLN: 5.0000 mg | INTRAMUSCULAR | Status: DC | PRN

## 2014-06-22 MED ORDER — PANTOPRAZOLE SODIUM 20 MG PO TBEC
20.0000 mg | DELAYED_RELEASE_TABLET | Freq: Every day | ORAL | Status: DC
  Administered 2014-06-24 – 2014-06-25 (×2): 20 mg via ORAL
  Filled 2014-06-22 (×4): qty 1

## 2014-06-22 MED ORDER — HEPARIN SODIUM (PORCINE) 5000 UNIT/ML IJ SOLN: 5000.0000 [IU] | Freq: Three times a day (TID) | INTRAMUSCULAR | Status: DC

## 2014-06-22 MED ORDER — SODIUM CHLORIDE 0.9 % IV BOLUS (SEPSIS)
1000.0000 mL | Freq: Once | INTRAVENOUS | Status: AC
  Administered 2014-06-22: 1000 mL via INTRAVENOUS

## 2014-06-22 MED ORDER — IOHEXOL 300 MG/ML  SOLN
100.0000 mL | Freq: Once | INTRAMUSCULAR | Status: AC | PRN
Start: 2014-06-22 — End: 2014-06-22
  Administered 2014-06-22: 100 mL via INTRAVENOUS

## 2014-06-22 MED ORDER — ONDANSETRON HCL 4 MG PO TABS: 4.0000 mg | ORAL_TABLET | Freq: Four times a day (QID) | ORAL | Status: DC | PRN

## 2014-06-22 MED ORDER — MORPHINE SULFATE 2 MG/ML IJ SOLN
2.0000 mg | INTRAMUSCULAR | Status: DC | PRN
  Administered 2014-06-23: 2 mg via INTRAVENOUS
  Filled 2014-06-22: qty 1

## 2014-06-22 MED ORDER — FENTANYL CITRATE 0.05 MG/ML IJ SOLN
25.0000 ug | Freq: Once | INTRAMUSCULAR | Status: AC
  Administered 2014-06-22: 25 ug via INTRAVENOUS
  Filled 2014-06-22: qty 2

## 2014-06-22 MED ORDER — SODIUM CHLORIDE 0.9 % IV SOLN
INTRAVENOUS | Status: DC
  Administered 2014-06-22 – 2014-06-24 (×4): via INTRAVENOUS

## 2014-06-22 MED ORDER — ONDANSETRON HCL 4 MG/2ML IJ SOLN
4.0000 mg | Freq: Once | INTRAMUSCULAR | Status: AC
  Administered 2014-06-22: 4 mg via INTRAVENOUS
  Filled 2014-06-22: qty 2

## 2014-06-22 MED ORDER — IOHEXOL 300 MG/ML  SOLN
25.0000 mL | Freq: Once | INTRAMUSCULAR | Status: AC | PRN
  Administered 2014-06-22: 25 mL via ORAL

## 2014-06-22 NOTE — ED Notes (Signed)
Registration at bedside.

## 2014-06-22 NOTE — ED Notes (Signed)
MD at bedside. 

## 2014-06-22 NOTE — ED Notes (Signed)
Pt c/o right sided abd pain since yesterday sts it is painful to lay on her right side, pain also worsens after she has a BM. Denies dysuria/pain after eating. Pt sts she has noticed that her urine has been darker than normal for a few days. Denies n/v/d. sts she has had a few loose stools but not diarrhea. sts normally she is constipated and the past couple days after eating she has a BM a few hours later which is abnormal for her. Denies blood in urine/stool. Nad, skin warm and dry, resp e/u.

## 2014-06-22 NOTE — ED Provider Notes (Signed)
TIME SEEN: 7:35 AM  CHIEF COMPLAINT: Abdominal pain, nausea  HPI: Patient is a 68 year old female with history of hypertension and diabetes no longer on medications secondary to exercise, history of peptic ulcers who presents to the emergency department with complaint upper abdominal pain and right upper: Her pain is been present for the past several days it is worse after eating. No alleviating factors. She states the pain radiates into her right upper back. She has had nausea but no vomiting or diarrhea. She states that her stool has been painful. Denies any chest pain shortness of breath. Described as moderate, sharp. She did have chills yesterday and this morning but no documented fever. No bloody stool or melena. No dysuria or hematuria. No prior history of abdominal surgery. No recent travel or sick contacts.   ROS: See HPI Constitutional: no fever  Eyes: no drainage  ENT: no runny nose   Cardiovascular:  no chest pain  Resp: no SOB  GI: no vomiting GU: no dysuria Integumentary: no rash  Allergy: no hives  Musculoskeletal: no leg swelling  Neurological: no slurred speech ROS otherwise negative  PAST MEDICAL HISTORY/PAST SURGICAL HISTORY:  Past Medical History  Diagnosis Date  . Hypertension   . UTI (urinary tract infection)   . History of stomach ulcers     MEDICATIONS:  Prior to Admission medications   Medication Sig Start Date End Date Taking? Authorizing Provider  lansoprazole (PREVACID) 15 MG capsule Take 15 mg by mouth daily at 12 noon.    Historical Provider, MD  metFORMIN (GLUCOPHAGE) 500 MG tablet Take one tab daily (500mg ) for 2 weeks then increase to 2 tabs daily (1000 mg) Recheck with PCP in 1 month 04/20/14   Midge Minium, MD    ALLERGIES:  No Known Allergies  SOCIAL HISTORY:  History  Substance Use Topics  . Smoking status: Former Research scientist (life sciences)  . Smokeless tobacco: Never Used  . Alcohol Use: No    FAMILY HISTORY: Family History  Problem Relation Age  of Onset  . Heart attack Mother   . Stroke Mother   . Heart attack Father   . Hyperlipidemia Sister   . Hypertension Sister   . Hypertension Brother   . Alcohol abuse Brother     EXAM: BP 188/67  Pulse 81  Temp(Src) 98 F (36.7 C) (Oral)  Resp 18  SpO2 100% CONSTITUTIONAL: Alert and oriented and responds appropriately to questions. Well-appearing; well-nourished HEAD: Normocephalic EYES: Conjunctivae clear, PERRL ENT: normal nose; no rhinorrhea; moist mucous membranes; pharynx without lesions noted NECK: Supple, no meningismus, no LAD  CARD: RRR; S1 and S2 appreciated; no murmurs, no clicks, no rubs, no gallops RESP: Normal chest excursion without splinting or tachypnea; breath sounds clear and equal bilaterally; no wheezes, no rhonchi, no rales,  ABD/GI: Normal bowel sounds; non-distended; soft, tender to palpation in the epigastric area and right upper quadrant with mildly positive Murphy sign, no guarding or rebound, no peritoneal signs BACK:  The back appears normal and is non-tender to palpation, there is no CVA tenderness EXT: Normal ROM in all joints; non-tender to palpation; no edema; normal capillary refill; no cyanosis    SKIN: Normal color for age and race; warm NEURO: Moves all extremities equally PSYCH: The patient's mood and manner are appropriate. Grooming and personal hygiene are appropriate.  MEDICAL DECISION MAKING: Patient here with epigastric and right upper quadrant pain. Differential diagnosis includes choledocholithiasis versus cholelithiasis versus cholecystitis, pancreatitis, gastritis, gastric ulcer, GERD, less likely ACS. Will obtain  abdominal labs, troponin, EKG, right upper quadrant abdominal ultrasound. We'll give pain and nausea medication.  ED PROGRESS: Patient's LFTs are all markedly elevated as well as her lipase. Discussed with radiologist who states patient has common bile duct dilation and a mass of her pancreatic head. Concern for possible  neoplasm. Recommends CT of her abdomen, pancreatic protocol.    12:45 PM  CT scan shows a 3.8 cm mass of the pancreatic head consistent with a pancreatic adenocarcinoma with mild peripancreatic and mesenteric lymphadenopathy consistent with metastatic disease. There also appears to be possible pulmonary metastases as well. Given she does have some obstructive pathology because of this mass, persistent pain and nausea, will discuss with oncology.    3:45 PM  Spoke with Dr. Marin Olp with oncology who recommends patient will need admission given she is having elevation of her LFTs and total bilirubin. Recommend GI consultation will likely need a stent placed. We'll discuss with hospitalist. Patient updated with plan.  4:00 PM  D/w Dr. Benson Norway with GI and Dr. Wyline Copas with hospitalist service for admission.   EKG Interpretation  Date/Time:  Friday June 22 2014 07:47:25 EDT Ventricular Rate:  75 PR Interval:  210 QRS Duration: 93 QT Interval:  394 QTC Calculation: 440 R Axis:   -6 Text Interpretation:  Sinus rhythm RAE, consider biatrial enlargement Confirmed by Daelyn Pettaway,  DO, Darrelyn Morro ([id number]) on 06/22/2014 8:12:46 AM        Angiocath insertion Performed by: Nyra Jabs  Consent: Verbal consent obtained. Risks and benefits: risks, benefits and alternatives were discussed Time out: Immediately prior to procedure a "time out" was called to verify the correct patient, procedure, equipment, support staff and site/side marked as required.  Preparation: Patient was prepped and draped in the usual sterile fashion.  Vein Location: Right a.c.  Ultrasound Guided  Gauge: 20  Normal blood return and flush without difficulty Patient tolerance: Patient tolerated the procedure well with no immediate complications.     [place], DO 06/22/14 1601

## 2014-06-22 NOTE — ED Notes (Signed)
Pt c/o left sided abd pain with some loose stool and weight loss

## 2014-06-22 NOTE — ED Notes (Signed)
Pt returned from radiology.

## 2014-06-22 NOTE — ED Notes (Signed)
Pt states that she isn't in pain, just hurts with urination.

## 2014-06-22 NOTE — H&P (Signed)
[facility] History and Physical  Zarra Geffert RFF:[id number] DOB: Feb 08, 1946 DOA: 06/22/2014  Referring physician: Emergency department PCP: Annye Asa, MD  Specialists:   Chief Complaint: Abd pain  HPI: Jacqueline Hahn is a 68 y.o. female  With a hx of htn and DM who presents with abd pain. Pt initially though sx were related to constipation. CT abd in ED revealed pancreatic head mass. LFT's including alk phos were markedly elevated. GI and oncology were consulted. Hospitalist consulted for consideration for admission.  Review of Systems:  Per above, the remainder of the 10pt ros reviewed and are neg  Past Medical History  Diagnosis Date  . Hypertension   . UTI (urinary tract infection)   . History of stomach ulcers   . Diabetes mellitus without complication    Past Surgical History  Procedure Laterality Date  . Dental surgery    . Bunionectomy     Social History:  reports that she has quit smoking. She has never used smokeless tobacco. She reports that she does not drink alcohol or use illicit drugs.  where does patient live--home, ALF, SNF? and with whom if at home?  Can patient participate in ADLs?  No Known Allergies  Family History  Problem Relation Age of Onset  . Heart attack Mother   . Stroke Mother   . Heart attack Father   . Hyperlipidemia Sister   . Hypertension Sister   . Hypertension Brother   . Alcohol abuse Brother     (be sure to complete)  Prior to Admission medications   Medication Sig Start Date End Date Taking? Authorizing Provider  lansoprazole (PREVACID) 15 MG capsule Take 15 mg by mouth daily at 12 noon.    Historical Provider, MD   Physical Exam: Filed Vitals:   06/22/14 1300 06/22/14 1330 06/22/14 1402 06/22/14 1552  BP: 168/77 175/87 159/82 163/74  Pulse: 95 98 99 84  Temp:      TempSrc:      Resp:   16 18  SpO2: 100% 100% 100% 98%     General:  Awake, in nad  Eyes: PERRL B  ENT: Membranes moist, dentition fair  Neck:  supple, trachea midline  Cardiovascular: regular, s1, s2  Respiratory: normal resp effort, no wheezing  Abdomen: soft,nondistended  Skin: normal skin turgor, no abnormal skin lesions seen  Musculoskeletal: perfused, no clubbing  Psychiatric: mood/affect normal//no auditory/visual hallucinations  Neurologic: cn2-12 grossly intact, strength/sensation intact  Labs on Admission:  Basic Metabolic Panel:  Recent Labs Lab 06/22/14 0730  NA 140  K 4.3  CL 103  CO2 23  GLUCOSE 156*  BUN 7  CREATININE 0.63  CALCIUM 10.3   Liver Function Tests:  Recent Labs Lab 06/22/14 0730  AST 405*  ALT 683*  ALKPHOS 595*  BILITOT 3.4*  PROT 8.0  ALBUMIN 3.8    Recent Labs Lab 06/22/14 0730  LIPASE 89*   No results found for this basename: AMMONIA,  in the last 168 hours CBC:  Recent Labs Lab 06/22/14 0730  WBC 8.1  NEUTROABS 4.1  HGB 13.3  HCT 40.5  MCV 72.8*  PLT 365   Cardiac Enzymes:  Recent Labs Lab 06/22/14 0730  TROPONINI <0.30    BNP (last 3 results) No results found for this basename: PROBNP,  in the last 8760 hours CBG: No results found for this basename: GLUCAP,  in the last 168 hours  Radiological Exams on Admission: US Abdomen Complete  06/22/2014   CLINICAL DATA:  RIGHT upper  quadrant pain, history hypertension  EXAM: ULTRASOUND ABDOMEN COMPLETE  COMPARISON:  None  FINDINGS: Gallbladder:  Contains low-level echogenic material throughout compatible with sludge. No shadowing calculi, gallbladder wall thickening, pericholecystic fluid or sonographic Murphy sign.  Common bile duct:  Diameter: Dilated 13 mm diameter.  Liver:  Echogenic, likely fatty infiltration, though this can be seen with cirrhosis and certain infiltrative disorders. No focal hepatic mass or nodularity. Central intrahepatic biliary dilatation.  IVC:  Normal appearance  Pancreas:  Focal enlargement of the pancreatic head, 3.5 x 2.8 x 3.4 cm, concerning for pancreatic mass/ tumor.  Remainder of pancreas demonstrates normal caliber.  Spleen:  Normal appearance, 4.6 cm length  Right Kidney:  Length: 9.8 cm.  Normal morphology without mass or hydronephrosis.  Left Kidney:  Length: 10.4 cm.  Normal morphology without mass or hydronephrosis.  Abdominal aorta:  Normal caliber  Other findings:  No free fluid  IMPRESSION: Sludge within gallbladder without definite gallstones.  Intrahepatic and extrahepatic biliary dilatation, CBD up to 13 mm diameter with potential pancreatic head mass, with a 3.5 x 2.8 x 3.4 cm diameter area of enlargement seen at pancreatic head; dedicated CT imaging of the abdomen with and without contrast (pancreatic protocol) recommended to assess for potential pancreatic head neoplasm.  Findings called to Dr. Leonides Schanz on 06/22/2014 at 0919 hr.   Electronically Signed   By: Lavonia Dana M.D.   On: 06/22/2014 09:20   Ct Abd Wo & W Cm  06/22/2014   CLINICAL DATA:  Right-sided abdominal pain. Biliuria. Biliary dilatation and pancreatic mass on ultrasound.  EXAM: CT ABDOMEN WITHOUT AND WITH CONTRAST  TECHNIQUE: Multidetector CT imaging of the abdomen was performed following the standard protocol before and following the bolus administration of intravenous contrast.  CONTRAST:  175m OMNIPAQUE IOHEXOL 300 MG/ML  SOLN  COMPARISON:  Ultrasound on 06/22/2014  FINDINGS: Liver: 2.5 cm benign hemangioma is seen in the medial segment left hepatic lobe. No other liver masses are identified. Diffuse intrahepatic biliary ductal dilatation is seen.  Gallbladder/Biliary: Gallbladder is unremarkable. Diffuse biliary ductal dilatation is seen with common bile duct measuring approximately 15 mm.  Pancreas: Ill-defined solid mass is seen in the pancreatic head and uncinate process which measures approximately 3.3 x 3.8 cm on image 20 of series 7. There is distal pancreatic atrophy and ductal dilatation. This is consistent with pancreatic adenocarcinoma. This mass does not involve the superior  mesenteric artery or vein or the portal vein.  Spleen:  Within normal limits in size and appearance.  Adrenal Glands:  No mass identified.  Kidneys:  No masses identified.  No evidence of hydronephrosis.  Lymph Nodes: A 13 mm portacaval lymph node is seen on image 17. Mild lymphadenopathy is seen gastrohepatic ligament and central small bowel mesentery with lymph nodes measuring up to 10 mm. This is consistent with metastatic disease.  Bowel: Visualized loops within the abdomen are unremarkable.  Vascular:  No evidence of abdominal aortic aneurysm.  Musculoskeletal:  No suspicious bone lesions identified.  Other: Multiple tiny sub-cm pulmonary nodules are seen in both lung bases, largest in the left lung base measuring 7 mm on image 3. These are suspicious for pulmonary metastases.  IMPRESSION: 3.8 cm mass in the pancreatic head uncinate process, consistent with pancreatic adenocarcinoma.  Mild peripancreatic and mesenteric lymphadenopathy within upper abdomen, consistent with metastatic disease.  Tiny sub-cm pulmonary nodule seen in both lung bases, suspicious for pulmonary metastases. Chest CT is recommended for further evaluation.   Electronically Signed  By: Earle Gell M.D.   On: 06/22/2014 12:39    Assessment/Plan Principal Problem:   Biliary obstruction Active Problems:   Elevated BP   Pancreatic mass   1. Pancreatic mass resulting in biliary obstruction 1. GI consulted 2. Oncology consulted 3. Cont with analgesics as needed 2. HTN 1. BP stable, albeit suboptimally controlled 2. Will cont with PRN hydralazine 3. DVT prophylaxis 1. Will cont with SCD's  Code Status: Full (must indicate code status--if unknown or must be presumed, indicate so) Family Communication: Pt in room (indicate person spoken with, if applicable, with phone number if by telephone) Disposition Plan: Penidng (indicate anticipated LOS)  Time spent: [place] Pager [phone]  If  7PM-7AM, please contact night-coverage [email or web address] Password [facility] 06/22/2014, 4:05 PM

## 2014-06-22 NOTE — Consult Note (Addendum)
Unassigned Consult  Reason for Consult: Pancreatic uncinate mass Referring Physician: [facility]  Sherril Cong HPI: This is a 68 year old female with a PMH of HTN, DM, and UTI who presents to the ER with complaints of dark urine and light colored stool.  She initially noticed this change yesterday and it was concerning for her.  She did not want to see her PCP and subsequently she presented to the ER.   Further evaluation in the ER revealed a 3 cm pancreatic mass in the uncinate process.  There is also biliary ductal dilation up to 13 mm and elevated liver enzymes.  No complaints of abdominal pain, nausea, vomiting, diarrhea, constipation, or weight loss.  She is not a smoker and there is no history of pancreatic cancer.  On a daily basis she is active and walks for exercise.  In June this year, her blood work, i.e., liver panel, was normal.  At that time she was told that she had diabetes, but she was dubious of that diagnosis.    Past Medical History  Diagnosis Date  . Hypertension   . UTI (urinary tract infection)   . History of stomach ulcers   . Diabetes mellitus without complication     Past Surgical History  Procedure Laterality Date  . Dental surgery    . Bunionectomy      Family History  Problem Relation Age of Onset  . Heart attack Mother   . Stroke Mother   . Heart attack Father   . Hyperlipidemia Sister   . Hypertension Sister   . Hypertension Brother   . Alcohol abuse Brother     Social History:  reports that she has quit smoking. She has never used smokeless tobacco. She reports that she does not drink alcohol or use illicit drugs.  Allergies: No Known Allergies  Medications: Scheduled: Continuous:  Results for orders placed during the hospital encounter of 06/22/14 (from the past 24 hour(s))  CBC WITH DIFFERENTIAL     Status: Abnormal   Collection Time    06/22/14  7:30 AM      Result Value Ref Range   WBC 8.1  4.0 - 10.5 K/uL   RBC 5.56 (*) 3.87 -  5.11 MIL/uL   Hemoglobin 13.3  12.0 - 15.0 g/dL   HCT 40.5  36.0 - 46.0 %   MCV 72.8 (*) 78.0 - 100.0 fL   MCH 23.9 (*) 26.0 - 34.0 pg   MCHC 32.8  30.0 - 36.0 g/dL   RDW 15.2  11.5 - 15.5 %   Platelets 365  150 - 400 K/uL   Neutrophils Relative % 50  43 - 77 %   Neutro Abs 4.1  1.7 - 7.7 K/uL   Lymphocytes Relative 40  12 - 46 %   Lymphs Abs 3.2  0.7 - 4.0 K/uL   Monocytes Relative 8  3 - 12 %   Monocytes Absolute 0.7  0.1 - 1.0 K/uL   Eosinophils Relative 2  0 - 5 %   Eosinophils Absolute 0.1  0.0 - 0.7 K/uL   Basophils Relative 0  0 - 1 %   Basophils Absolute 0.0  0.0 - 0.1 K/uL  COMPREHENSIVE METABOLIC PANEL     Status: Abnormal   Collection Time    06/22/14  7:30 AM      Result Value Ref Range   Sodium 140  137 - 147 mEq/L   Potassium 4.3  3.7 - 5.3 mEq/L   Chloride  103  96 - 112 mEq/L   CO2 23  19 - 32 mEq/L   Glucose, Bld 156 (*) 70 - 99 mg/dL   BUN 7  6 - 23 mg/dL   Creatinine, Ser 0.63  0.50 - 1.10 mg/dL   Calcium 10.3  8.4 - 10.5 mg/dL   Total Protein 8.0  6.0 - 8.3 g/dL   Albumin 3.8  3.5 - 5.2 g/dL   AST 405 (*) 0 - 37 U/L   ALT 683 (*) 0 - 35 U/L   Alkaline Phosphatase 595 (*) 39 - 117 U/L   Total Bilirubin 3.4 (*) 0.3 - 1.2 mg/dL   GFR calc non Af Amer >90  >90 mL/min   GFR calc Af Amer >90  >90 mL/min   Anion gap 14  5 - 15  LIPASE, BLOOD     Status: Abnormal   Collection Time    06/22/14  7:30 AM      Result Value Ref Range   Lipase 89 (*) 11 - 59 U/L  TROPONIN I     Status: None   Collection Time    06/22/14  7:30 AM      Result Value Ref Range   Troponin I <0.30  <0.30 ng/mL  URINALYSIS, ROUTINE W REFLEX MICROSCOPIC     Status: Abnormal   Collection Time    06/22/14  7:56 AM      Result Value Ref Range   Color, Urine ORANGE (*) YELLOW   APPearance HAZY (*) CLEAR   Specific Gravity, Urine 1.016  1.005 - 1.030   pH 6.0  5.0 - 8.0   Glucose, UA NEGATIVE  NEGATIVE mg/dL   Hgb urine dipstick NEGATIVE  NEGATIVE   Bilirubin Urine LARGE (*)  NEGATIVE   Ketones, ur NEGATIVE  NEGATIVE mg/dL   Protein, ur NEGATIVE  NEGATIVE mg/dL   Urobilinogen, UA 0.2  0.0 - 1.0 mg/dL   Nitrite NEGATIVE  NEGATIVE   Leukocytes, UA SMALL (*) NEGATIVE  URINE MICROSCOPIC-ADD ON     Status: None   Collection Time    06/22/14  7:56 AM      Result Value Ref Range   Squamous Epithelial / LPF RARE  RARE   WBC, UA 0-2  <3 WBC/hpf   RBC / HPF 0-2  <3 RBC/hpf   Bacteria, UA RARE  RARE   Urine-Other MUCOUS PRESENT       US Abdomen Complete  06/22/2014   CLINICAL DATA:  RIGHT upper quadrant pain, history hypertension  EXAM: ULTRASOUND ABDOMEN COMPLETE  COMPARISON:  None  FINDINGS: Gallbladder:  Contains low-level echogenic material throughout compatible with sludge. No shadowing calculi, gallbladder wall thickening, pericholecystic fluid or sonographic Murphy sign.  Common bile duct:  Diameter: Dilated 13 mm diameter.  Liver:  Echogenic, likely fatty infiltration, though this can be seen with cirrhosis and certain infiltrative disorders. No focal hepatic mass or nodularity. Central intrahepatic biliary dilatation.  IVC:  Normal appearance  Pancreas:  Focal enlargement of the pancreatic head, 3.5 x 2.8 x 3.4 cm, concerning for pancreatic mass/ tumor. Remainder of pancreas demonstrates normal caliber.  Spleen:  Normal appearance, 4.6 cm length  Right Kidney:  Length: 9.8 cm.  Normal morphology without mass or hydronephrosis.  Left Kidney:  Length: 10.4 cm.  Normal morphology without mass or hydronephrosis.  Abdominal aorta:  Normal caliber  Other findings:  No free fluid  IMPRESSION: Sludge within gallbladder without definite gallstones.  Intrahepatic and extrahepatic biliary dilatation, CBD up to 13  mm diameter with potential pancreatic head mass, with a 3.5 x 2.8 x 3.4 cm diameter area of enlargement seen at pancreatic head; dedicated CT imaging of the abdomen with and without contrast (pancreatic protocol) recommended to assess for potential pancreatic head neoplasm.   Findings called to Dr. Leonides Schanz on 06/22/2014 at 0919 hr.   Electronically Signed   By: Lavonia Dana M.D.   On: 06/22/2014 09:20   Ct Abd Wo & W Cm  06/22/2014   CLINICAL DATA:  Right-sided abdominal pain. Biliuria. Biliary dilatation and pancreatic mass on ultrasound.  EXAM: CT ABDOMEN WITHOUT AND WITH CONTRAST  TECHNIQUE: Multidetector CT imaging of the abdomen was performed following the standard protocol before and following the bolus administration of intravenous contrast.  CONTRAST:  126mL OMNIPAQUE IOHEXOL 300 MG/ML  SOLN  COMPARISON:  Ultrasound on 06/22/2014  FINDINGS: Liver: 2.5 cm benign hemangioma is seen in the medial segment left hepatic lobe. No other liver masses are identified. Diffuse intrahepatic biliary ductal dilatation is seen.  Gallbladder/Biliary: Gallbladder is unremarkable. Diffuse biliary ductal dilatation is seen with common bile duct measuring approximately 15 mm.  Pancreas: Ill-defined solid mass is seen in the pancreatic head and uncinate process which measures approximately 3.3 x 3.8 cm on image 20 of series 7. There is distal pancreatic atrophy and ductal dilatation. This is consistent with pancreatic adenocarcinoma. This mass does not involve the superior mesenteric artery or vein or the portal vein.  Spleen:  Within normal limits in size and appearance.  Adrenal Glands:  No mass identified.  Kidneys:  No masses identified.  No evidence of hydronephrosis.  Lymph Nodes: A 13 mm portacaval lymph node is seen on image 17. Mild lymphadenopathy is seen gastrohepatic ligament and central small bowel mesentery with lymph nodes measuring up to 10 mm. This is consistent with metastatic disease.  Bowel: Visualized loops within the abdomen are unremarkable.  Vascular:  No evidence of abdominal aortic aneurysm.  Musculoskeletal:  No suspicious bone lesions identified.  Other: Multiple tiny sub-cm pulmonary nodules are seen in both lung bases, largest in the left lung base measuring 7 mm on image  3. These are suspicious for pulmonary metastases.  IMPRESSION: 3.8 cm mass in the pancreatic head uncinate process, consistent with pancreatic adenocarcinoma.  Mild peripancreatic and mesenteric lymphadenopathy within upper abdomen, consistent with metastatic disease.  Tiny sub-cm pulmonary nodule seen in both lung bases, suspicious for pulmonary metastases. Chest CT is recommended for further evaluation.   Electronically Signed   By: Earle Gell M.D.   On: 06/22/2014 12:39    ROS:  As stated above in the HPI otherwise negative.  Blood pressure 163/74, pulse 84, temperature 98.4 F (36.9 C), temperature source Oral, resp. rate 18, SpO2 98.00%.    PE: Gen: NAD, Alert and Oriented HEENT:  [place]/AT, EOMI Neck: Supple, no LAD Lungs: CTA Bilaterally CV: RRR without M/G/R ABM: Soft, NTND, +BS Ext: No C/C/E  Assessment/Plan: 1) Pancreatic uncinate mass. 2) Biliary obstruction.   With the elevation in the liver enzymes and overt biliary ductal compression an ERCP is required.  Additionally, she will require an EUS for further evaluation, but this can be performed as an outpatient.  The recent diagnosis or DM is consistent with the development of pancreatic cancer.  Plan: 1) ERCP tomorrow. 2) Cipro 400 mg IV q12 hours. 3) Check Ca 19-9. 4) Hold heparin for the ERCP.  Tedra Coppernoll D 06/22/2014, 4:09 PM

## 2014-06-23 ENCOUNTER — Encounter (HOSPITAL_COMMUNITY): Payer: Medicare Other | Admitting: Anesthesiology

## 2014-06-23 ENCOUNTER — Encounter (HOSPITAL_COMMUNITY): Payer: Self-pay | Admitting: *Deleted

## 2014-06-23 ENCOUNTER — Inpatient Hospital Stay (HOSPITAL_COMMUNITY): Payer: Medicare Other

## 2014-06-23 ENCOUNTER — Inpatient Hospital Stay (HOSPITAL_COMMUNITY): Payer: Medicare Other | Admitting: Anesthesiology

## 2014-06-23 ENCOUNTER — Encounter (HOSPITAL_COMMUNITY)
Admission: EM | Disposition: A | Discharge status: Home / Self Care | Payer: Self-pay | Source: Home / Self Care | Attending: Internal Medicine

## 2014-06-23 DIAGNOSIS — K869 Disease of pancreas, unspecified: Secondary | ICD-10-CM

## 2014-06-23 DIAGNOSIS — K831 Obstruction of bile duct: Secondary | ICD-10-CM

## 2014-06-23 DIAGNOSIS — R932 Abnormal findings on diagnostic imaging of liver and biliary tract: Secondary | ICD-10-CM

## 2014-06-23 DIAGNOSIS — R7401 Elevation of levels of liver transaminase levels: Secondary | ICD-10-CM

## 2014-06-23 DIAGNOSIS — R740 Nonspecific elevation of levels of transaminase and lactic acid dehydrogenase [LDH]: Secondary | ICD-10-CM

## 2014-06-23 HISTORY — PX: ERCP: SHX5425

## 2014-06-23 LAB — CBC
HEMATOCRIT: 34.5 % — AB (ref 36.0–46.0)
HEMOGLOBIN: 11.1 g/dL — AB (ref 12.0–15.0)
MCH: 23.3 pg — ABNORMAL LOW (ref 26.0–34.0)
MCHC: 32.2 g/dL (ref 30.0–36.0)
MCV: 72.5 fL — ABNORMAL LOW (ref 78.0–100.0)
Platelets: 332 10*3/uL (ref 150–400)
RBC: 4.76 MIL/uL (ref 3.87–5.11)
RDW: 15.3 % (ref 11.5–15.5)
WBC: 7.9 10*3/uL (ref 4.0–10.5)

## 2014-06-23 LAB — COMPREHENSIVE METABOLIC PANEL
ALBUMIN: 3.3 g/dL — AB (ref 3.5–5.2)
ALK PHOS: 512 U/L — AB (ref 39–117)
ALT: 599 U/L — ABNORMAL HIGH (ref 0–35)
ANION GAP: 15 (ref 5–15)
AST: 340 U/L — ABNORMAL HIGH (ref 0–37)
BUN: 10 mg/dL (ref 6–23)
CO2: 22 mEq/L (ref 19–32)
Calcium: 9.4 mg/dL (ref 8.4–10.5)
Chloride: 106 mEq/L (ref 96–112)
Creatinine, Ser: 0.69 mg/dL (ref 0.50–1.10)
GFR calc Af Amer: >90 mL/min (ref >90)
GFR calc non Af Amer: 88 mL/min — ABNORMAL LOW (ref >90)
Glucose, Bld: 134 mg/dL — ABNORMAL HIGH (ref 70–99)
POTASSIUM: 4 meq/L (ref 3.7–5.3)
Sodium: 143 mEq/L (ref 137–147)
TOTAL PROTEIN: 7 g/dL (ref 6.0–8.3)
Total Bilirubin: 3.9 mg/dL — ABNORMAL HIGH (ref 0.3–1.2)

## 2014-06-23 LAB — CANCER ANTIGEN 19-9: CA 19-9: 7712.9 U/mL — ABNORMAL HIGH (ref <35.0)

## 2014-06-23 SURGERY — ERCP, WITH INTERVENTION IF INDICATED
Anesthesia: General

## 2014-06-23 MED ORDER — LIDOCAINE HCL (CARDIAC) 20 MG/ML IV SOLN
INTRAVENOUS | Status: DC | PRN
  Administered 2014-06-23: 60 mg via INTRAVENOUS

## 2014-06-23 MED ORDER — SCOPOLAMINE 1 MG/3DAYS TD PT72
MEDICATED_PATCH | TRANSDERMAL | Status: AC
  Filled 2014-06-23: qty 1

## 2014-06-23 MED ORDER — PHENYLEPHRINE HCL 10 MG/ML IJ SOLN
10.0000 mg | INTRAVENOUS | Status: DC | PRN
  Administered 2014-06-23: 80 ug/min via INTRAVENOUS

## 2014-06-23 MED ORDER — CIPROFLOXACIN IN D5W 400 MG/200ML IV SOLN
INTRAVENOUS | Status: DC | PRN
  Administered 2014-06-23: 400 mg via INTRAVENOUS

## 2014-06-23 MED ORDER — ONDANSETRON HCL 4 MG/2ML IJ SOLN: 4.0000 mg | Freq: Once | INTRAMUSCULAR | Status: AC | PRN

## 2014-06-23 MED ORDER — GLUCAGON HCL RDNA (DIAGNOSTIC) 1 MG IJ SOLR
INTRAMUSCULAR | Status: DC | PRN
  Administered 2014-06-23 (×3): 0.25 mg via INTRAVENOUS

## 2014-06-23 MED ORDER — CIPROFLOXACIN IN D5W 400 MG/200ML IV SOLN
INTRAVENOUS | Status: AC
  Filled 2014-06-23: qty 200

## 2014-06-23 MED ORDER — GLYCOPYRROLATE 0.2 MG/ML IJ SOLN
INTRAMUSCULAR | Status: DC | PRN
  Administered 2014-06-23: 0.4 mg via INTRAVENOUS

## 2014-06-23 MED ORDER — FENTANYL CITRATE 0.05 MG/ML IJ SOLN
INTRAMUSCULAR | Status: AC
  Administered 2014-06-23 (×2): 50 ug via INTRAVENOUS
  Filled 2014-06-23: qty 2

## 2014-06-23 MED ORDER — ROCURONIUM BROMIDE 100 MG/10ML IV SOLN
INTRAVENOUS | Status: DC | PRN
  Administered 2014-06-23: 10 mg via INTRAVENOUS

## 2014-06-23 MED ORDER — SODIUM CHLORIDE 0.9 % IV SOLN
INTRAVENOUS | Status: DC | PRN
  Administered 2014-06-23: 13:00:00

## 2014-06-23 MED ORDER — ONDANSETRON HCL 4 MG/2ML IJ SOLN
INTRAMUSCULAR | Status: DC | PRN
  Administered 2014-06-23 (×2): 4 mg via INTRAVENOUS

## 2014-06-23 MED ORDER — HYDROMORPHONE HCL PF 1 MG/ML IJ SOLN: 0.2500 mg | INTRAMUSCULAR | Status: DC | PRN

## 2014-06-23 MED ORDER — MORPHINE SULFATE 2 MG/ML IJ SOLN
2.0000 mg | INTRAMUSCULAR | Status: DC | PRN
  Administered 2014-06-23: 2 mg via INTRAVENOUS
  Filled 2014-06-23: qty 1

## 2014-06-23 MED ORDER — PROPOFOL 10 MG/ML IV BOLUS
INTRAVENOUS | Status: DC | PRN
  Administered 2014-06-23: 200 mg via INTRAVENOUS

## 2014-06-23 MED ORDER — INDOMETHACIN 50 MG RE SUPP
100.0000 mg | Freq: Once | RECTAL | Status: DC
  Filled 2014-06-23: qty 2

## 2014-06-23 MED ORDER — CALCIUM CARBONATE ANTACID 500 MG PO CHEW
1.0000 | CHEWABLE_TABLET | Freq: Four times a day (QID) | ORAL | Status: DC | PRN
  Administered 2014-06-23 – 2014-06-24 (×3): 200 mg via ORAL
  Filled 2014-06-23 (×3): qty 1

## 2014-06-23 MED ORDER — OXYCODONE HCL 5 MG PO TABS: 5.0000 mg | ORAL_TABLET | ORAL | Status: DC | PRN

## 2014-06-23 MED ORDER — SODIUM CHLORIDE 0.9 % IV SOLN
INTRAVENOUS | Status: DC | PRN
  Administered 2014-06-23: 11:00:00 via INTRAVENOUS

## 2014-06-23 MED ORDER — SCOPOLAMINE 1 MG/3DAYS TD PT72
MEDICATED_PATCH | TRANSDERMAL | Status: DC | PRN
  Administered 2014-06-23: 1 via TRANSDERMAL

## 2014-06-23 MED ORDER — NEOSTIGMINE METHYLSULFATE 10 MG/10ML IV SOLN
INTRAVENOUS | Status: DC | PRN
  Administered 2014-06-23: 2 mg via INTRAVENOUS

## 2014-06-23 MED ORDER — GLUCAGON HCL RDNA (DIAGNOSTIC) 1 MG IJ SOLR
INTRAMUSCULAR | Status: AC
  Filled 2014-06-23: qty 2

## 2014-06-23 NOTE — Progress Notes (Signed)
[facility] PROGRESS NOTE  Jacqueline Hahn ACZ:[id number] DOB: Mar 16, 1946 DOA: 06/22/2014 PCP: Annye Asa, MD  Assessment/Plan: Principal Problem:   Biliary obstruction Active Problems:   Elevated BP   Pancreatic mass      Pancreatic mass-3.8 cm in the pancreatic head uncinate process Appreciate gastroenterology consultation ERCP today Likely palliative treatment, not curative Oncology to see of patient  over the weekend  resume Lovenox post ERCP  Hypertension Blood pressure stable  when necessary hydralazine   Diabetes mellitus Hemoglobin A1c 8.3 on 6/18 We'll place the patient on sliding scale insulin    Code Status: full Family Communication: family updated about patient's clinical progress Disposition Plan:  As above    Brief narrative: *This is a 68 year old female with a PMH of HTN, DM, and UTI who presents to the ER with complaints of dark urine and light colored stool. She initially noticed this change yesterday and it was concerning for her. She did not want to see her PCP and subsequently she presented to the ER. Further evaluation in the ER revealed a 3 cm pancreatic mass in the uncinate process. There is also biliary ductal dilation up to 13 mm and elevated liver enzymes. No complaints of abdominal pain, nausea, vomiting, diarrhea, constipation, or weight loss. She is not a smoker and there is no history of pancreatic cancer. On a daily basis she is active and walks for exercise. In June this year, her blood work, i.e., liver panel, was normal. At that time she was told that she had diabetes, but she was dubious of that diagnosis.    Consultants:  Oncology  Gastroenterology    Procedures:  ERCP 8/22  Antibiotics:  *Ciprofloxacin  HPI/Subjective: Denies any nausea vomiting abdominal pain  Objective: Filed Vitals:   06/22/14 1807 06/22/14 1900 06/22/14 2143 06/23/14 0525  BP: 166/75 146/64 131/85 138/88  Pulse: 78  86 82  Temp: 97.9 F  (36.6 C)  98.1 F (36.7 C) 98.5 F (36.9 C)  TempSrc: Oral  Oral Oral  Resp: 16  18 16   Height: 5' (1.524 m)     Weight: 65.318 kg (144 lb)     SpO2: 100%  98% 100%    Intake/Output Summary (Last 24 hours) at 06/23/14 1111 Last data filed at 06/23/14 0525  Gross per 24 hour  Intake   1055 ml  Output   1650 ml  Net   -595 ml    Exam:  General: alert & oriented x 3 In NAD  Cardiovascular: RRR, nl S1 s2  Respiratory: Decreased breath sounds at the bases, scattered rhonchi, no crackles  Abdomen: soft +BS NT/ND, no masses palpable  Extremities: No cyanosis and no edema      Data Reviewed: Basic Metabolic Panel:  Recent Labs Lab 06/22/14 0730 06/22/14 1733 06/23/14 0530  NA 140  --  143  K 4.3  --  4.0  CL 103  --  106  CO2 23  --  22  GLUCOSE 156*  --  134*  BUN 7  --  10  CREATININE 0.63 0.71 0.69  CALCIUM 10.3  --  9.4    Liver Function Tests:  Recent Labs Lab 06/22/14 0730 06/23/14 0530  AST 405* 340*  ALT 683* 599*  ALKPHOS 595* 512*  BILITOT 3.4* 3.9*  PROT 8.0 7.0  ALBUMIN 3.8 3.3*    Recent Labs Lab 06/22/14 0730  LIPASE 89*   No results found for this basename: AMMONIA,  in the last 168 hours  CBC:  Recent Labs Lab 06/22/14 0730 06/22/14 1733 06/23/14 0530  WBC 8.1 7.6 7.9  NEUTROABS 4.1  --   --   HGB 13.3 10.9* 11.1*  HCT 40.5 34.0* 34.5*  MCV 72.8* 72.0* 72.5*  PLT 365 320 332    Cardiac Enzymes:  Recent Labs Lab 06/22/14 0730  TROPONINI <0.30   BNP (last 3 results) No results found for this basename: PROBNP,  in the last 8760 hours   CBG: No results found for this basename: GLUCAP,  in the last 168 hours  No results found for this or any previous visit (from the past 240 hour(s)).   Studies: US Abdomen Complete  06/22/2014   CLINICAL DATA:  RIGHT upper quadrant pain, history hypertension  EXAM: ULTRASOUND ABDOMEN COMPLETE  COMPARISON:  None  FINDINGS: Gallbladder:  Contains low-level echogenic material  throughout compatible with sludge. No shadowing calculi, gallbladder wall thickening, pericholecystic fluid or sonographic Murphy sign.  Common bile duct:  Diameter: Dilated 13 mm diameter.  Liver:  Echogenic, likely fatty infiltration, though this can be seen with cirrhosis and certain infiltrative disorders. No focal hepatic mass or nodularity. Central intrahepatic biliary dilatation.  IVC:  Normal appearance  Pancreas:  Focal enlargement of the pancreatic head, 3.5 x 2.8 x 3.4 cm, concerning for pancreatic mass/ tumor. Remainder of pancreas demonstrates normal caliber.  Spleen:  Normal appearance, 4.6 cm length  Right Kidney:  Length: 9.8 cm.  Normal morphology without mass or hydronephrosis.  Left Kidney:  Length: 10.4 cm.  Normal morphology without mass or hydronephrosis.  Abdominal aorta:  Normal caliber  Other findings:  No free fluid  IMPRESSION: Sludge within gallbladder without definite gallstones.  Intrahepatic and extrahepatic biliary dilatation, CBD up to 13 mm diameter with potential pancreatic head mass, with a 3.5 x 2.8 x 3.4 cm diameter area of enlargement seen at pancreatic head; dedicated CT imaging of the abdomen with and without contrast (pancreatic protocol) recommended to assess for potential pancreatic head neoplasm.  Findings called to Dr. Leonides Schanz on 06/22/2014 at 0919 hr.   Electronically Signed   By: Lavonia Dana M.D.   On: 06/22/2014 09:20   Ct Abd Wo & W Cm  06/22/2014   CLINICAL DATA:  Right-sided abdominal pain. Biliuria. Biliary dilatation and pancreatic mass on ultrasound.  EXAM: CT ABDOMEN WITHOUT AND WITH CONTRAST  TECHNIQUE: Multidetector CT imaging of the abdomen was performed following the standard protocol before and following the bolus administration of intravenous contrast.  CONTRAST:  182mL OMNIPAQUE IOHEXOL 300 MG/ML  SOLN  COMPARISON:  Ultrasound on 06/22/2014  FINDINGS: Liver: 2.5 cm benign hemangioma is seen in the medial segment left hepatic lobe. No other liver masses  are identified. Diffuse intrahepatic biliary ductal dilatation is seen.  Gallbladder/Biliary: Gallbladder is unremarkable. Diffuse biliary ductal dilatation is seen with common bile duct measuring approximately 15 mm.  Pancreas: Ill-defined solid mass is seen in the pancreatic head and uncinate process which measures approximately 3.3 x 3.8 cm on image 20 of series 7. There is distal pancreatic atrophy and ductal dilatation. This is consistent with pancreatic adenocarcinoma. This mass does not involve the superior mesenteric artery or vein or the portal vein.  Spleen:  Within normal limits in size and appearance.  Adrenal Glands:  No mass identified.  Kidneys:  No masses identified.  No evidence of hydronephrosis.  Lymph Nodes: A 13 mm portacaval lymph node is seen on image 17. Mild lymphadenopathy is seen gastrohepatic ligament and central small bowel mesentery with  lymph nodes measuring up to 10 mm. This is consistent with metastatic disease.  Bowel: Visualized loops within the abdomen are unremarkable.  Vascular:  No evidence of abdominal aortic aneurysm.  Musculoskeletal:  No suspicious bone lesions identified.  Other: Multiple tiny sub-cm pulmonary nodules are seen in both lung bases, largest in the left lung base measuring 7 mm on image 3. These are suspicious for pulmonary metastases.  IMPRESSION: 3.8 cm mass in the pancreatic head uncinate process, consistent with pancreatic adenocarcinoma.  Mild peripancreatic and mesenteric lymphadenopathy within upper abdomen, consistent with metastatic disease.  Tiny sub-cm pulmonary nodule seen in both lung bases, suspicious for pulmonary metastases. Chest CT is recommended for further evaluation.   Electronically Signed   By: Earle Gell M.D.   On: 06/22/2014 12:39    Scheduled Meds: . fentaNYL      . pantoprazole  20 mg Oral Daily  . scopolamine       Continuous Infusions: . sodium chloride 100 mL/hr at 06/23/14 0421    Principal Problem:   Biliary  obstruction Active Problems:   Elevated BP   Pancreatic mass    Time spent: 40 minutes   [facility] Pager [phone]. If 7PM-7AM, please contact night-coverage at [email or web address], password [facility] 06/23/2014, 11:11 AM  LOS: 1 day

## 2014-06-23 NOTE — Transfer of Care (Signed)
Immediate Anesthesia Transfer of Care Note  Patient: Jacqueline Hahn  Procedure(s) Performed: Procedure(s): ENDOSCOPIC RETROGRADE CHOLANGIOPANCREATOGRAPHY (ERCP) (N/A)  Patient Location: PACU  Anesthesia Type:General  Level of Consciousness: sedated  Airway & Oxygen Therapy: Patient Spontanous Breathing and Patient connected to nasal cannula oxygen  Post-op Assessment: Report given to PACU RN and Post -op Vital signs reviewed and stable  Post vital signs: Reviewed and stable  Complications: No apparent anesthesia complications

## 2014-06-23 NOTE — Anesthesia Procedure Notes (Signed)
Procedure Name: Intubation Date/Time: 06/23/2014 11:44 AM Performed by: Marinda Elk A Pre-anesthesia Checklist: Patient identified, Timeout performed, Emergency Drugs available, Patient being monitored and Suction available Patient Re-evaluated:Patient Re-evaluated prior to inductionOxygen Delivery Method: Circle system utilized Preoxygenation: Pre-oxygenation with 100% oxygen Intubation Type: IV induction Ventilation: Mask ventilation without difficulty Tube type: Oral Tube size: 7.5 mm Number of attempts: 1 Airway Equipment and Method: Video-laryngoscopy Placement Confirmation: ETT inserted through vocal cords under direct vision,  breath sounds checked- equal and bilateral and positive ETCO2 Dental Injury: Teeth and Oropharynx as per pre-operative assessment

## 2014-06-23 NOTE — Op Note (Addendum)
[facility] [place], [id number]   ERCP PROCEDURE REPORT  PATIENT: Jacqueline Hahn, Jacqueline Hahn  MR# :[id number] BIRTHDATE: [birth year]  GENDER: Female ENDOSCOPIST: Ladene Artist, MD, [facility] REFERRED BY: [facility] PROCEDURE DATE:  06/23/2014 PROCEDURE:   ERCP with stent placement ASA CLASS:   Class III INDICATIONS:abnormal liver function test.   jaundice.   bile duct stricture. MEDICATIONS: General endotracheal anesthesia (GETA) TOPICAL ANESTHETIC: none DESCRIPTION OF PROCEDURE:   After the risks benefits and alternatives of the procedure were thoroughly explained, informed consent was obtained.  The [id number] ([id number])  endoscope was introduced through the mouth  and advanced to the second portion of the duodenum . 1.  A long, tight, malignant appearing stricture was noted in the distal common bile duct. 2.  Under endoscopic and fluoroscopic guidance a 10 Fr x 7 cm double flap stent was placed in the bile duct with good drainage of dark bile. 3.  There was dilation of the proximal common bile duct and intrahepatic ducts proximal to the stricture. 4.  To gain access to the bile duct, "double-wire" technique was performed in which a wire was placed in the pancreatic duct and the bile duct then cannulated with a second wire.  Following this maneuver, deep CBD cannulation was achieved. The PD was not injected with contrast to decrease the risk of pancreatitis. 5.  The ampulla was normal appearing and located in the second portion of the duodenum. 6.  Given the difficult cannulation CBD brushings were not obtained. Dr. Benson Norway plans to perform EUS soon. The scope was then completely withdrawn from the patient and the procedure terminated.     COMPLICATIONS:  ENDOSCOPIC IMPRESSION: 1.   Distal common bile duct stricture, malignant appearing 2.   Under endoscopic and fluoroscopic guidance a 10 Fr x 7 cm biliary stent was placed 3.   Proximal  common bile duct and intrahepatic duct dilation  RECOMMENDATIONS: 1.  Trend liver enzymes 2.  Tentative plans for elective EUS with FNA per Dr. Benson Norway  eSigned:  Ladene Artist, MD, [facility] 06/23/2014 3:55 PM Revised: 06/23/2014 3:55 PM  RC:[id number] Benson Norway, MD

## 2014-06-23 NOTE — Anesthesia Postprocedure Evaluation (Signed)
  Anesthesia Post-op Note  Patient: Jacqueline Hahn  Procedure(s) Performed: Procedure(s): ENDOSCOPIC RETROGRADE CHOLANGIOPANCREATOGRAPHY (ERCP) (N/A)  Patient Location: PACU  Anesthesia Type:General  Level of Consciousness: awake, alert , oriented and patient cooperative  Airway and Oxygen Therapy: Patient Spontanous Breathing  Post-op Pain: none  Post-op Assessment: Post-op Vital signs reviewed, Patient's Cardiovascular Status Stable, Respiratory Function Stable, Patent Airway and No signs of Nausea or vomiting  Post-op Vital Signs: stable  Last Vitals:  Filed Vitals:   06/23/14 1125  BP: 225/114  Pulse: 88  Temp: 36.8 C  Resp:     Complications: No apparent anesthesia complications

## 2014-06-23 NOTE — H&P (View-Only) (Signed)
Unassigned Consult  Reason for Consult: Pancreatic uncinate mass Referring Physician: [facility]  Sherril Cong HPI: This is a 68 year old female with a PMH of HTN, DM, and UTI who presents to the ER with complaints of dark urine and light colored stool.  She initially noticed this change yesterday and it was concerning for her.  She did not want to see her PCP and subsequently she presented to the ER.   Further evaluation in the ER revealed a 3 cm pancreatic mass in the uncinate process.  There is also biliary ductal dilation up to 13 mm and elevated liver enzymes.  No complaints of abdominal pain, nausea, vomiting, diarrhea, constipation, or weight loss.  She is not a smoker and there is no history of pancreatic cancer.  On a daily basis she is active and walks for exercise.  In June this year, her blood work, i.e., liver panel, was normal.  At that time she was told that she had diabetes, but she was dubious of that diagnosis.    Past Medical History  Diagnosis Date  . Hypertension   . UTI (urinary tract infection)   . History of stomach ulcers   . Diabetes mellitus without complication     Past Surgical History  Procedure Laterality Date  . Dental surgery    . Bunionectomy      Family History  Problem Relation Age of Onset  . Heart attack Mother   . Stroke Mother   . Heart attack Father   . Hyperlipidemia Sister   . Hypertension Sister   . Hypertension Brother   . Alcohol abuse Brother     Social History:  reports that she has quit smoking. She has never used smokeless tobacco. She reports that she does not drink alcohol or use illicit drugs.  Allergies: No Known Allergies  Medications: Scheduled: Continuous:  Results for orders placed during the hospital encounter of 06/22/14 (from the past 24 hour(s))  CBC WITH DIFFERENTIAL     Status: Abnormal   Collection Time    06/22/14  7:30 AM      Result Value Ref Range   WBC 8.1  4.0 - 10.5 K/uL   RBC 5.56 (*) 3.87 -  5.11 MIL/uL   Hemoglobin 13.3  12.0 - 15.0 g/dL   HCT 40.5  36.0 - 46.0 %   MCV 72.8 (*) 78.0 - 100.0 fL   MCH 23.9 (*) 26.0 - 34.0 pg   MCHC 32.8  30.0 - 36.0 g/dL   RDW 15.2  11.5 - 15.5 %   Platelets 365  150 - 400 K/uL   Neutrophils Relative % 50  43 - 77 %   Neutro Abs 4.1  1.7 - 7.7 K/uL   Lymphocytes Relative 40  12 - 46 %   Lymphs Abs 3.2  0.7 - 4.0 K/uL   Monocytes Relative 8  3 - 12 %   Monocytes Absolute 0.7  0.1 - 1.0 K/uL   Eosinophils Relative 2  0 - 5 %   Eosinophils Absolute 0.1  0.0 - 0.7 K/uL   Basophils Relative 0  0 - 1 %   Basophils Absolute 0.0  0.0 - 0.1 K/uL  COMPREHENSIVE METABOLIC PANEL     Status: Abnormal   Collection Time    06/22/14  7:30 AM      Result Value Ref Range   Sodium 140  137 - 147 mEq/L   Potassium 4.3  3.7 - 5.3 mEq/L   Chloride  103  96 - 112 mEq/L   CO2 23  19 - 32 mEq/L   Glucose, Bld 156 (*) 70 - 99 mg/dL   BUN 7  6 - 23 mg/dL   Creatinine, Ser 0.63  0.50 - 1.10 mg/dL   Calcium 10.3  8.4 - 10.5 mg/dL   Total Protein 8.0  6.0 - 8.3 g/dL   Albumin 3.8  3.5 - 5.2 g/dL   AST 405 (*) 0 - 37 U/L   ALT 683 (*) 0 - 35 U/L   Alkaline Phosphatase 595 (*) 39 - 117 U/L   Total Bilirubin 3.4 (*) 0.3 - 1.2 mg/dL   GFR calc non Af Amer >90  >90 mL/min   GFR calc Af Amer >90  >90 mL/min   Anion gap 14  5 - 15  LIPASE, BLOOD     Status: Abnormal   Collection Time    06/22/14  7:30 AM      Result Value Ref Range   Lipase 89 (*) 11 - 59 U/L  TROPONIN I     Status: None   Collection Time    06/22/14  7:30 AM      Result Value Ref Range   Troponin I <0.30  <0.30 ng/mL  URINALYSIS, ROUTINE W REFLEX MICROSCOPIC     Status: Abnormal   Collection Time    06/22/14  7:56 AM      Result Value Ref Range   Color, Urine ORANGE (*) YELLOW   APPearance HAZY (*) CLEAR   Specific Gravity, Urine 1.016  1.005 - 1.030   pH 6.0  5.0 - 8.0   Glucose, UA NEGATIVE  NEGATIVE mg/dL   Hgb urine dipstick NEGATIVE  NEGATIVE   Bilirubin Urine LARGE (*)  NEGATIVE   Ketones, ur NEGATIVE  NEGATIVE mg/dL   Protein, ur NEGATIVE  NEGATIVE mg/dL   Urobilinogen, UA 0.2  0.0 - 1.0 mg/dL   Nitrite NEGATIVE  NEGATIVE   Leukocytes, UA SMALL (*) NEGATIVE  URINE MICROSCOPIC-ADD ON     Status: None   Collection Time    06/22/14  7:56 AM      Result Value Ref Range   Squamous Epithelial / LPF RARE  RARE   WBC, UA 0-2  <3 WBC/hpf   RBC / HPF 0-2  <3 RBC/hpf   Bacteria, UA RARE  RARE   Urine-Other MUCOUS PRESENT       US Abdomen Complete  06/22/2014   CLINICAL DATA:  RIGHT upper quadrant pain, history hypertension  EXAM: ULTRASOUND ABDOMEN COMPLETE  COMPARISON:  None  FINDINGS: Gallbladder:  Contains low-level echogenic material throughout compatible with sludge. No shadowing calculi, gallbladder wall thickening, pericholecystic fluid or sonographic Murphy sign.  Common bile duct:  Diameter: Dilated 13 mm diameter.  Liver:  Echogenic, likely fatty infiltration, though this can be seen with cirrhosis and certain infiltrative disorders. No focal hepatic mass or nodularity. Central intrahepatic biliary dilatation.  IVC:  Normal appearance  Pancreas:  Focal enlargement of the pancreatic head, 3.5 x 2.8 x 3.4 cm, concerning for pancreatic mass/ tumor. Remainder of pancreas demonstrates normal caliber.  Spleen:  Normal appearance, 4.6 cm length  Right Kidney:  Length: 9.8 cm.  Normal morphology without mass or hydronephrosis.  Left Kidney:  Length: 10.4 cm.  Normal morphology without mass or hydronephrosis.  Abdominal aorta:  Normal caliber  Other findings:  No free fluid  IMPRESSION: Sludge within gallbladder without definite gallstones.  Intrahepatic and extrahepatic biliary dilatation, CBD up to 13  mm diameter with potential pancreatic head mass, with a 3.5 x 2.8 x 3.4 cm diameter area of enlargement seen at pancreatic head; dedicated CT imaging of the abdomen with and without contrast (pancreatic protocol) recommended to assess for potential pancreatic head neoplasm.   Findings called to Dr. Leonides Schanz on 06/22/2014 at 0919 hr.   Electronically Signed   By: Lavonia Dana M.D.   On: 06/22/2014 09:20   Ct Abd Wo & W Cm  06/22/2014   CLINICAL DATA:  Right-sided abdominal pain. Biliuria. Biliary dilatation and pancreatic mass on ultrasound.  EXAM: CT ABDOMEN WITHOUT AND WITH CONTRAST  TECHNIQUE: Multidetector CT imaging of the abdomen was performed following the standard protocol before and following the bolus administration of intravenous contrast.  CONTRAST:  126mL OMNIPAQUE IOHEXOL 300 MG/ML  SOLN  COMPARISON:  Ultrasound on 06/22/2014  FINDINGS: Liver: 2.5 cm benign hemangioma is seen in the medial segment left hepatic lobe. No other liver masses are identified. Diffuse intrahepatic biliary ductal dilatation is seen.  Gallbladder/Biliary: Gallbladder is unremarkable. Diffuse biliary ductal dilatation is seen with common bile duct measuring approximately 15 mm.  Pancreas: Ill-defined solid mass is seen in the pancreatic head and uncinate process which measures approximately 3.3 x 3.8 cm on image 20 of series 7. There is distal pancreatic atrophy and ductal dilatation. This is consistent with pancreatic adenocarcinoma. This mass does not involve the superior mesenteric artery or vein or the portal vein.  Spleen:  Within normal limits in size and appearance.  Adrenal Glands:  No mass identified.  Kidneys:  No masses identified.  No evidence of hydronephrosis.  Lymph Nodes: A 13 mm portacaval lymph node is seen on image 17. Mild lymphadenopathy is seen gastrohepatic ligament and central small bowel mesentery with lymph nodes measuring up to 10 mm. This is consistent with metastatic disease.  Bowel: Visualized loops within the abdomen are unremarkable.  Vascular:  No evidence of abdominal aortic aneurysm.  Musculoskeletal:  No suspicious bone lesions identified.  Other: Multiple tiny sub-cm pulmonary nodules are seen in both lung bases, largest in the left lung base measuring 7 mm on image  3. These are suspicious for pulmonary metastases.  IMPRESSION: 3.8 cm mass in the pancreatic head uncinate process, consistent with pancreatic adenocarcinoma.  Mild peripancreatic and mesenteric lymphadenopathy within upper abdomen, consistent with metastatic disease.  Tiny sub-cm pulmonary nodule seen in both lung bases, suspicious for pulmonary metastases. Chest CT is recommended for further evaluation.   Electronically Signed   By: Earle Gell M.D.   On: 06/22/2014 12:39    ROS:  As stated above in the HPI otherwise negative.  Blood pressure 163/74, pulse 84, temperature 98.4 F (36.9 C), temperature source Oral, resp. rate 18, SpO2 98.00%.    PE: Gen: NAD, Alert and Oriented HEENT:  [place]/AT, EOMI Neck: Supple, no LAD Lungs: CTA Bilaterally CV: RRR without M/G/R ABM: Soft, NTND, +BS Ext: No C/C/E  Assessment/Plan: 1) Pancreatic uncinate mass. 2) Biliary obstruction.   With the elevation in the liver enzymes and overt biliary ductal compression an ERCP is required.  Additionally, she will require an EUS for further evaluation, but this can be performed as an outpatient.  The recent diagnosis or DM is consistent with the development of pancreatic cancer.  Plan: 1) ERCP tomorrow. 2) Cipro 400 mg IV q12 hours. 3) Check Ca 19-9. 4) Hold heparin for the ERCP.  Lister Brizzi D 06/22/2014, 4:09 PM

## 2014-06-23 NOTE — Consult Note (Signed)
I am aware of the patient's admission and will see her over the weekend.   We need to have histological proof of malignancy, although this really looks like pancreatic ca.  GI will see her and likely put a stent in to prevent biliary obstruction.  The only treatment option here is chemotherapy.  It is NOT curative, but palliative.  I will pray hard for God to provide the wisdom needed to help her!!!  Frederich Cha 1:5-7

## 2014-06-23 NOTE — Anesthesia Preprocedure Evaluation (Addendum)
Anesthesia Evaluation  Patient identified by MRN, date of birth, ID band Patient awake    Reviewed: Allergy & Precautions, H&P , NPO status   Airway Mallampati: I TM Distance: >3 FB Neck ROM: Full    Dental  (+) Edentulous Lower, Dental Advisory Given   Pulmonary former smoker,          Cardiovascular hypertension,     Neuro/Psych    GI/Hepatic GERD-  ,  Endo/Other  diabetes  Renal/GU      Musculoskeletal   Abdominal   Peds  Hematology   Anesthesia Other Findings Biliary obstruction  Reproductive/Obstetrics                        Anesthesia Physical Anesthesia Plan  ASA: I  Anesthesia Plan: General   Post-op Pain Management:    Induction: Intravenous  Airway Management Planned: Oral ETT  Additional Equipment:   Intra-op Plan:   Post-operative Plan: Extubation in OR  Informed Consent: I have reviewed the patients History and Physical, chart, labs and discussed the procedure including the risks, benefits and alternatives for the proposed anesthesia with the patient or authorized representative who has indicated his/her understanding and acceptance.     Plan Discussed with: CRNA, Anesthesiologist and Surgeon  Anesthesia Plan Comments:         Anesthesia Quick Evaluation

## 2014-06-23 NOTE — Interval H&P Note (Signed)
History and Physical Interval Note:  06/23/2014 11:29 AM  Jacqueline Hahn  has presented today for surgery, with the diagnosis of Pancreatic mass with biliary obstruction  The various methods of treatment have been discussed with the patient and family. After consideration of risks, benefits and other options for treatment, the patient has consented to  Procedure(s): ENDOSCOPIC RETROGRADE CHOLANGIOPANCREATOGRAPHY (ERCP) (N/A) as a surgical intervention .  The patient's history has been reviewed, patient examined, no change in status, stable for surgery.  I have reviewed the patient's chart and labs.  Questions were answered to the patient's satisfaction.     Pricilla Riffle. Fuller Plan MD

## 2014-06-24 ENCOUNTER — Inpatient Hospital Stay (HOSPITAL_COMMUNITY): Payer: Medicare Other

## 2014-06-24 DIAGNOSIS — C259 Malignant neoplasm of pancreas, unspecified: Secondary | ICD-10-CM

## 2014-06-24 LAB — CBC
HCT: 33.6 % — ABNORMAL LOW (ref 36.0–46.0)
Hemoglobin: 10.7 g/dL — ABNORMAL LOW (ref 12.0–15.0)
MCH: 23 pg — ABNORMAL LOW (ref 26.0–34.0)
MCHC: 31.8 g/dL (ref 30.0–36.0)
MCV: 72.1 fL — AB (ref 78.0–100.0)
PLATELETS: 292 10*3/uL (ref 150–400)
RBC: 4.66 MIL/uL (ref 3.87–5.11)
RDW: 15.2 % (ref 11.5–15.5)
WBC: 8.9 10*3/uL (ref 4.0–10.5)

## 2014-06-24 LAB — COMPREHENSIVE METABOLIC PANEL
ALBUMIN: 3.2 g/dL — AB (ref 3.5–5.2)
ALT: 470 U/L — ABNORMAL HIGH (ref 0–35)
AST: 174 U/L — ABNORMAL HIGH (ref 0–37)
Alkaline Phosphatase: 473 U/L — ABNORMAL HIGH (ref 39–117)
Anion gap: 15 (ref 5–15)
BILIRUBIN TOTAL: 1.2 mg/dL (ref 0.3–1.2)
BUN: 7 mg/dL (ref 6–23)
CO2: 22 mEq/L (ref 19–32)
Calcium: 9 mg/dL (ref 8.4–10.5)
Chloride: 96 mEq/L (ref 96–112)
Creatinine, Ser: 0.58 mg/dL (ref 0.50–1.10)
Glucose, Bld: 132 mg/dL — ABNORMAL HIGH (ref 70–99)
POTASSIUM: 3.9 meq/L (ref 3.7–5.3)
SODIUM: 133 meq/L — AB (ref 137–147)
TOTAL PROTEIN: 7.1 g/dL (ref 6.0–8.3)

## 2014-06-24 LAB — LIPASE, BLOOD: LIPASE: 71 U/L — AB (ref 11–59)

## 2014-06-24 MED ORDER — IOHEXOL 300 MG/ML  SOLN
80.0000 mL | Freq: Once | INTRAMUSCULAR | Status: AC | PRN
  Administered 2014-06-24: 80 mL via INTRAVENOUS

## 2014-06-24 MED ORDER — PROMETHAZINE HCL 25 MG/ML IJ SOLN
12.5000 mg | Freq: Four times a day (QID) | INTRAMUSCULAR | Status: DC | PRN
  Administered 2014-06-24: 12.5 mg via INTRAVENOUS
  Filled 2014-06-24: qty 1

## 2014-06-24 MED ORDER — ENOXAPARIN SODIUM 40 MG/0.4ML ~~LOC~~ SOLN
40.0000 mg | SUBCUTANEOUS | Status: DC
Start: 2014-06-24 — End: 2014-06-25
  Administered 2014-06-24: 40 mg via SUBCUTANEOUS
  Filled 2014-06-24 (×2): qty 0.4

## 2014-06-24 MED ORDER — LABETALOL HCL 100 MG PO TABS
100.0000 mg | ORAL_TABLET | Freq: Three times a day (TID) | ORAL | Status: DC
  Administered 2014-06-24 (×2): 100 mg via ORAL
  Filled 2014-06-24 (×6): qty 1

## 2014-06-24 MED ORDER — AMLODIPINE BESYLATE 10 MG PO TABS
10.0000 mg | ORAL_TABLET | Freq: Every day | ORAL | Status: DC
  Administered 2014-06-24 – 2014-06-25 (×2): 10 mg via ORAL
  Filled 2014-06-24 (×2): qty 1
  Filled 2014-06-24: qty 2

## 2014-06-24 MED ORDER — HYDRALAZINE HCL 20 MG/ML IJ SOLN
10.0000 mg | INTRAMUSCULAR | Status: DC | PRN
  Administered 2014-06-24: 10 mg via INTRAVENOUS
  Filled 2014-06-24: qty 1

## 2014-06-24 NOTE — Consult Note (Cosign Needed)
Jacqueline Hahn, FREEHLING                   ACCOUNT NO.:  [id number]  MEDICAL RECORD NO.:  [id number]  LOCATION:  6N05C                        FACILITY:  [facility]  PHYSICIAN:  [name], M.D.  DATE OF BIRTH:  06-21-1946  DATE OF CONSULTATION:  06/23/2014 DATE OF DISCHARGE:                                CONSULTATION   REFERRING PHYSICIAN:  Reyne Dumas, MD.  REASON FOR CONSULTATION: 1. Likely metastatic adenocarcinoma of the pancreas. 2. Biliary obstruction secondary to pancreatic mass.  HISTORY OF PRESENT ILLNESS:  Jacqueline Hahn is a very charming 68 year old, African American female.  She is originally from [place].  [place]  She moved from [place].  In [place], she worked on Mining engineer.  She has been having some increasing abdominal pain over the past 4-5 days.  She has denied any kind of weight loss.  She has had a little nausea but no vomiting.  She had noted that her urine was dark that her stools were light pink color.  She also has recently developed diabetes.  She came to the emergency room.  There may have been some dehydration.  The patient had a CT scan done of the abdomen and pelvis.  This showed a ill-defined pancreatic mass in the head of the pancreas and uncinate process.  It measured 3.3 x 3.8 cm.  There is distal pancreatic atrophy and ductal dilation.  The mass does not involve the superior mesenteric artery or vein.  She had diffuse biliary ductal dilatation.  She had mild lymphadenopathy in the gastrohepatic region and small bowel mesenteries.  She had multiple tiny subcentimeter pulmonary nodules.  Again these were "suspicious" for metastasis.  She was admitted.  She was started on IV fluids.  Gastroenterology saw her.  She did have a stent placed.  This was done without complication. Her bilirubin has come down.  When she was admitted her bilirubin was 3.4.  Alkaline phosphatase was 595.  Her SGPT was 683 and SGOT  was 405. Albumin 3.8.  Today, her bilirubin is 1.2.  SGPT 470, SGOT 170. Alkaline phosphatase is 473.  Albumin is 3.2. She had mild anemia.  Today, her white count is 8.9, hemoglobin 10.7, hematocrit 33.6, platelet count 292.  Her CA19-9, unfortunately, is 7700.  She has had no cough.  She has had no diarrhea.  Again, she says that her urine was dark and her stools were light in color.  She denies any type of fever.  There has been no bleeding.  There are no rashes.  She says that she recently was diagnosed with diabetes.  We were asked to see her because of the likely possibility of metastatic pancreatic cancer.  Before she got sick, she is quite active.  She was retired.  However, she was independent.  Currently, her performance status is ECOG 1-2.  PAST MEDICAL HISTORY:  Remarkable for, 1. Hypertension. 2. Non-insulin-dependent diabetes.  ALLERGIES:  NONE.  HOME MEDICATIONS:  Prevacid 15 mg p.o. daily.  SOCIAL HISTORY:  Remarkable for past tobacco use.  She has no alcohol use.  FAMILY HISTORY:  Pretty much unrevealing.  There is  a history of heart attack and stroke.  REVIEW OF SYSTEMS:  As stated in history of present illness.  PHYSICAL EXAMINATION:  GENERAL:  This is a fairly well-developed and well-nourished [place] American female.  She is alert and oriented x3. VITAL SIGNS:  Temperature of 98, pulse 82, respiratory rate 20, blood pressure 170/70. HEENT:  No ocular or oral lesions.  There is no scleral icterus.  She has no adenopathy in the neck. LUNGS:  Clear bilaterally.  She has no rales, wheezes, or rhonchi. CARDIAC:  Regular rate and rhythm with no murmurs, rubs, or bruits. ABDOMEN:  Soft.  There is some slight tenderness due to the recent endoscopic procedure with stent placement.  Bowel sounds are slightly decreased.  There is no obvious fluid wave.  There is no obvious abdominal mass.  There is no obvious hepatosplenomegaly. EXTREMITIES:  No clubbing,  cyanosis, or edema.  She has good range of motion of her joints. SKIN:  No rashes, ecchymosis, or petechia. NEUROLOGIC:  No focal neurological deficits.  IMPRESSION:  Jacqueline Hahn is a very nice 68 year old African American female.  Again, she has been in good health until this recent admission.  She clearly has pancreatic cancer.  The CA-19-9 is quite indicative of metastatic disease.  I know we do not have a biopsy yet, but I feel very confident that we are dealing with metastatic pancreatic cancer.  She apparently will be undergoing an endoscopic ultrasound with biopsy early in the week.  I had a nice talk with her.  I told her what I felt was going on.  I explained her that felt like she did have stage IV pancreatic cancer.  I told her the stage IV pancreatic cancer is a treatable, but not curable. I did not talk to her about time frame.  Again, the scans look like she does have metastatic disease.  I will get a CT scan of the chest to get a better look at the chest and mediastinum.  I do not think she needs a PET scan.  I do not think she needs a bone scan.  We will have to await the biopsy results.  I suspect she probably will need a Port-A-Cath if we are going to do chemotherapy.  We will certainly follow her along.  I had a very nice talk with Jacqueline Hahn.  She is very-very nice.  She is very realistic and understands what the problem is.  She also understands that this is not curable.  I spent a good 60 minutes with her.  I answered all of her questions.     [name], M.D.     PRE/MEDQ  D:  06/24/2014  T:  06/24/2014  Job:  [id number]  cc:   Annye Asa, M.D.

## 2014-06-24 NOTE — Progress Notes (Addendum)
[facility] PROGRESS NOTE  Jacqueline Hahn OIZ:[id number] DOB: [birth year] DOA: 06/22/2014 PCP: Annye Asa, MD  Assessment/Plan: Principal Problem:   Biliary obstruction Active Problems:   Elevated BP   Pancreatic mass   Nonspecific elevation of levels of transaminase or lactic acid dehydrogenase (LDH)   Nonspecific (abnormal) findings on radiological and other examination of biliary tract    Pancreatic mass-3.8 cm in the pancreatic head uncinate process  Appreciate gastroenterology consultation   Biliary stricture with obstruction secondary to #1. S/P ERCP with biliary stent placement yesterday. LFTs and t bili have improved. N/V might be secondary to GA . Check lipase today to R/O pancreatitis although she does not have abd pain or abd tenderness today. Continue clear liquids for now.  Likely palliative treatment, not curative  Oncology to see of patient over the weekend  resume Lovenox post ERCP  Dr. Benson Norway to assume care tomorrow.  EUS with FNA as next step   Hypertension  Blood pressure stable  when necessary hydralazine   Diabetes mellitus  Hemoglobin A1c 8.3 on 6/18  We'll place the patient on sliding scale insulin    Code Status: full  Family Communication: family updated about patient's clinical progress  Disposition Plan: As above   Brief narrative:  *This is a 68 year old female with a PMH of HTN, DM, and UTI who presents to the ER with complaints of dark urine and light colored stool. She initially noticed this change yesterday and it was concerning for her. She did not want to see her PCP and subsequently she presented to the ER. Further evaluation in the ER revealed a 3 cm pancreatic mass in the uncinate process. There is also biliary ductal dilation up to 13 mm and elevated liver enzymes. No complaints of abdominal pain, nausea, vomiting, diarrhea, constipation, or weight loss. She is not a smoker and there is no history of pancreatic cancer. On a daily basis  she is active and walks for exercise. In June this year, her blood work, i.e., liver panel, was normal. At that time she was told that she had diabetes, but she was dubious of that diagnosis.  Consultants:  Oncology  Gastroenterology  Procedures:  ERCP 8/22 Antibiotics:  *Ciprofloxacin     HPI/Subjective: Had N/V last evening, which has now resolved. No abd pain.    Objective: Filed Vitals:   06/23/14 1328 06/23/14 2216 06/24/14 0202 06/24/14 0613  BP: 151/70 166/92 172/68 170/70  Pulse: 73 84 78 82  Temp: 97.7 F (36.5 C) 98 F (36.7 C) 97.8 F (36.6 C) 98 F (36.7 C)  TempSrc: Oral Oral Oral Oral  Resp: 15 20 20 20   Height:      Weight:      SpO2: 100% 98% 100% 100%    Intake/Output Summary (Last 24 hours) at 06/24/14 1131 Last data filed at 06/23/14 2021  Gross per 24 hour  Intake 1466.67 ml  Output   1300 ml  Net 166.67 ml    Exam:  General: Alert, Well-developed, in NAD  Heart: Regular rate and rhythm; no murmurs  Abdomen: Soft, nontender and nondistended. Normal bowel sounds, without guarding, and without rebound.  Extremities: Without edema.  Neurologic: Alert and oriented x4; grossly normal neurologically.  Psych: Alert and cooperative. Normal mood and affect       Data Reviewed: Basic Metabolic Panel:  Recent Labs Lab 06/22/14 0730 06/22/14 1733 06/23/14 0530 06/24/14 0503  NA 140  --  143 133*  K 4.3  --  4.0 3.9  CL 103  --  106 96  CO2 23  --  22 22  GLUCOSE 156*  --  134* 132*  BUN 7  --  10 7  CREATININE 0.63 0.71 0.69 0.58  CALCIUM 10.3  --  9.4 9.0    Liver Function Tests:  Recent Labs Lab 06/22/14 0730 06/23/14 0530 06/24/14 0503  AST 405* 340* 174*  ALT 683* 599* 470*  ALKPHOS 595* 512* 473*  BILITOT 3.4* 3.9* 1.2  PROT 8.0 7.0 7.1  ALBUMIN 3.8 3.3* 3.2*    Recent Labs Lab 06/22/14 0730 06/24/14 0531  LIPASE 89* 71*   No results found for this basename: AMMONIA,  in the last 168 hours  CBC:  Recent  Labs Lab 06/22/14 0730 06/22/14 1733 06/23/14 0530 06/24/14 0503  WBC 8.1 7.6 7.9 8.9  NEUTROABS 4.1  --   --   --   HGB 13.3 10.9* 11.1* 10.7*  HCT 40.5 34.0* 34.5* 33.6*  MCV 72.8* 72.0* 72.5* 72.1*  PLT 365 320 332 292    Cardiac Enzymes:  Recent Labs Lab 06/22/14 0730  TROPONINI <0.30   BNP (last 3 results) No results found for this basename: PROBNP,  in the last 8760 hours   CBG: No results found for this basename: GLUCAP,  in the last 168 hours  No results found for this or any previous visit (from the past 240 hour(s)).   Studies: US Abdomen Complete  06/22/2014   CLINICAL DATA:  RIGHT upper quadrant pain, history hypertension  EXAM: ULTRASOUND ABDOMEN COMPLETE  COMPARISON:  None  FINDINGS: Gallbladder:  Contains low-level echogenic material throughout compatible with sludge. No shadowing calculi, gallbladder wall thickening, pericholecystic fluid or sonographic Murphy sign.  Common bile duct:  Diameter: Dilated 13 mm diameter.  Liver:  Echogenic, likely fatty infiltration, though this can be seen with cirrhosis and certain infiltrative disorders. No focal hepatic mass or nodularity. Central intrahepatic biliary dilatation.  IVC:  Normal appearance  Pancreas:  Focal enlargement of the pancreatic head, 3.5 x 2.8 x 3.4 cm, concerning for pancreatic mass/ tumor. Remainder of pancreas demonstrates normal caliber.  Spleen:  Normal appearance, 4.6 cm length  Right Kidney:  Length: 9.8 cm.  Normal morphology without mass or hydronephrosis.  Left Kidney:  Length: 10.4 cm.  Normal morphology without mass or hydronephrosis.  Abdominal aorta:  Normal caliber  Other findings:  No free fluid  IMPRESSION: Sludge within gallbladder without definite gallstones.  Intrahepatic and extrahepatic biliary dilatation, CBD up to 13 mm diameter with potential pancreatic head mass, with a 3.5 x 2.8 x 3.4 cm diameter area of enlargement seen at pancreatic head; dedicated CT imaging of the abdomen with  and without contrast (pancreatic protocol) recommended to assess for potential pancreatic head neoplasm.  Findings called to Dr. Leonides Schanz on 06/22/2014 at 0919 hr.   Electronically Signed   By: Lavonia Dana M.D.   On: 06/22/2014 09:20   Ct Abd Wo & W Cm  06/22/2014   CLINICAL DATA:  Right-sided abdominal pain. Biliuria. Biliary dilatation and pancreatic mass on ultrasound.  EXAM: CT ABDOMEN WITHOUT AND WITH CONTRAST  TECHNIQUE: Multidetector CT imaging of the abdomen was performed following the standard protocol before and following the bolus administration of intravenous contrast.  CONTRAST:  135mL OMNIPAQUE IOHEXOL 300 MG/ML  SOLN  COMPARISON:  Ultrasound on 06/22/2014  FINDINGS: Liver: 2.5 cm benign hemangioma is seen in the medial segment left hepatic lobe. No other liver masses are identified. Diffuse intrahepatic  biliary ductal dilatation is seen.  Gallbladder/Biliary: Gallbladder is unremarkable. Diffuse biliary ductal dilatation is seen with common bile duct measuring approximately 15 mm.  Pancreas: Ill-defined solid mass is seen in the pancreatic head and uncinate process which measures approximately 3.3 x 3.8 cm on image 20 of series 7. There is distal pancreatic atrophy and ductal dilatation. This is consistent with pancreatic adenocarcinoma. This mass does not involve the superior mesenteric artery or vein or the portal vein.  Spleen:  Within normal limits in size and appearance.  Adrenal Glands:  No mass identified.  Kidneys:  No masses identified.  No evidence of hydronephrosis.  Lymph Nodes: A 13 mm portacaval lymph node is seen on image 17. Mild lymphadenopathy is seen gastrohepatic ligament and central small bowel mesentery with lymph nodes measuring up to 10 mm. This is consistent with metastatic disease.  Bowel: Visualized loops within the abdomen are unremarkable.  Vascular:  No evidence of abdominal aortic aneurysm.  Musculoskeletal:  No suspicious bone lesions identified.  Other: Multiple tiny  sub-cm pulmonary nodules are seen in both lung bases, largest in the left lung base measuring 7 mm on image 3. These are suspicious for pulmonary metastases.  IMPRESSION: 3.8 cm mass in the pancreatic head uncinate process, consistent with pancreatic adenocarcinoma.  Mild peripancreatic and mesenteric lymphadenopathy within upper abdomen, consistent with metastatic disease.  Tiny sub-cm pulmonary nodule seen in both lung bases, suspicious for pulmonary metastases. Chest CT is recommended for further evaluation.   Electronically Signed   By: Earle Gell M.D.   On: 06/22/2014 12:39   Dg Ercp Biliary & Pancreatic Ducts  06/23/2014   CLINICAL DATA:  Common bile duct stent placement  EXAM: ERCP  TECHNIQUE: Multiple spot images obtained with the fluoroscopic device and submitted for interpretation post-procedure.  COMPARISON:  CT 06/22/2014  FINDINGS: An ERCP was performed to with cannulation of the common bile duct and pancreatic duct. The proximal common bile duct and intrahepatic ducts are markedly dilated. On subsequent images a common bile duct stent is placed within the strictured portion of the distal CBD. On the final images the bile ducts appear partially decompressed.  IMPRESSION: Interval stent placement and decompression of the bile ducts  These images were submitted for radiologic interpretation only. Please see the procedural report for the amount of contrast and the fluoroscopy time utilized.   Electronically Signed   By: Kerby Moors M.D.   On: 06/23/2014 13:54    Scheduled Meds: . amLODipine  10 mg Oral Daily  . enoxaparin (LOVENOX) injection  40 mg Subcutaneous Q24H  . indomethacin  100 mg Rectal Once  . labetalol  100 mg Oral TID  . pantoprazole  20 mg Oral Daily   Continuous Infusions: . sodium chloride 100 mL/hr at 06/24/14 5188    Principal Problem:   Biliary obstruction Active Problems:   Elevated BP   Pancreatic mass   Nonspecific elevation of levels of transaminase or  lactic acid dehydrogenase (LDH)   Nonspecific (abnormal) findings on radiological and other examination of biliary tract    Time spent: 40 minutes   [facility] Pager [phone]. If 7PM-7AM, please contact night-coverage at [email or web address], password [facility] 06/24/2014, 11:31 AM  LOS: 2 days

## 2014-06-24 NOTE — Consult Note (Signed)
#   [id number] sis formal consult note.  Jacqueline Hahn 33:3

## 2014-06-24 NOTE — Progress Notes (Signed)
GI Progress Note  Covering for Dr. Benson Norway   Subjective  Had N/V last evening, which has now resolved. No abd pain.    Objective  Vital signs in last 24 hours: Temp:  [97.7 F (36.5 C)-98.3 F (36.8 C)] 98 F (36.7 C) (08/23 8676) Pulse Rate:  [69-88] 82 (08/23 0613) Resp:  [14-20] 20 (08/23 1950) BP: (145-225)/(68-114) 170/70 mmHg (08/23 0613) SpO2:  [98 %-100 %] 100 % (08/23 9326) Last BM Date: 06/22/14  General:   Alert,  Well-developed,   in NAD Heart:  Regular rate and rhythm; no murmurs Abdomen:  Soft, nontender and nondistended. Normal bowel sounds, without guarding, and without rebound.   Extremities:  Without edema. Neurologic:  Alert and  oriented x4;  grossly normal neurologically. Psych:  Alert and cooperative. Normal mood and affect.  Intake/Output from previous day: 08/22 0701 - 08/23 0700 In: 1466.7 [P.O.:360; I.V.:1106.7] Out: 1300 [Urine:1100; Emesis/NG output:200] Intake/Output this shift:    Lab Results:  Recent Labs  06/22/14 1733 06/23/14 0530 06/24/14 0503  WBC 7.6 7.9 8.9  HGB 10.9* 11.1* 10.7*  HCT 34.0* 34.5* 33.6*  PLT 320 332 292   BMET  Recent Labs  06/22/14 0730 06/22/14 1733 06/23/14 0530 06/24/14 0503  NA 140  --  143 133*  K 4.3  --  4.0 3.9  CL 103  --  106 96  CO2 23  --  22 22  GLUCOSE 156*  --  134* 132*  BUN 7  --  10 7  CREATININE 0.63 0.71 0.69 0.58  CALCIUM 10.3  --  9.4 9.0   LFT  Recent Labs  06/24/14 0503  PROT 7.1  ALBUMIN 3.2*  AST 174*  ALT 470*  ALKPHOS 473*  BILITOT 1.2    Studies/Results: US Abdomen Complete  06/22/2014   CLINICAL DATA:  RIGHT upper quadrant pain, history hypertension  EXAM: ULTRASOUND ABDOMEN COMPLETE  COMPARISON:  None  FINDINGS: Gallbladder:  Contains low-level echogenic material throughout compatible with sludge. No shadowing calculi, gallbladder wall thickening, pericholecystic fluid or sonographic Murphy sign.  Common bile duct:  Diameter: Dilated 13 mm diameter.   Liver:  Echogenic, likely fatty infiltration, though this can be seen with cirrhosis and certain infiltrative disorders. No focal hepatic mass or nodularity. Central intrahepatic biliary dilatation.  IVC:  Normal appearance  Pancreas:  Focal enlargement of the pancreatic head, 3.5 x 2.8 x 3.4 cm, concerning for pancreatic mass/ tumor. Remainder of pancreas demonstrates normal caliber.  Spleen:  Normal appearance, 4.6 cm length  Right Kidney:  Length: 9.8 cm.  Normal morphology without mass or hydronephrosis.  Left Kidney:  Length: 10.4 cm.  Normal morphology without mass or hydronephrosis.  Abdominal aorta:  Normal caliber  Other findings:  No free fluid  IMPRESSION: Sludge within gallbladder without definite gallstones.  Intrahepatic and extrahepatic biliary dilatation, CBD up to 13 mm diameter with potential pancreatic head mass, with a 3.5 x 2.8 x 3.4 cm diameter area of enlargement seen at pancreatic head; dedicated CT imaging of the abdomen with and without contrast (pancreatic protocol) recommended to assess for potential pancreatic head neoplasm.  Findings called to Dr. Leonides Schanz on 06/22/2014 at 0919 hr.   Electronically Signed   By: Lavonia Dana M.D.   On: 06/22/2014 09:20   Ct Abd Wo & W Cm  06/22/2014   CLINICAL DATA:  Right-sided abdominal pain. Biliuria. Biliary dilatation and pancreatic mass on ultrasound.  EXAM: CT ABDOMEN WITHOUT AND WITH CONTRAST  TECHNIQUE: Multidetector CT  imaging of the abdomen was performed following the standard protocol before and following the bolus administration of intravenous contrast.  CONTRAST:  184mL OMNIPAQUE IOHEXOL 300 MG/ML  SOLN  COMPARISON:  Ultrasound on 06/22/2014  FINDINGS: Liver: 2.5 cm benign hemangioma is seen in the medial segment left hepatic lobe. No other liver masses are identified. Diffuse intrahepatic biliary ductal dilatation is seen.  Gallbladder/Biliary: Gallbladder is unremarkable. Diffuse biliary ductal dilatation is seen with common bile duct  measuring approximately 15 mm.  Pancreas: Ill-defined solid mass is seen in the pancreatic head and uncinate process which measures approximately 3.3 x 3.8 cm on image 20 of series 7. There is distal pancreatic atrophy and ductal dilatation. This is consistent with pancreatic adenocarcinoma. This mass does not involve the superior mesenteric artery or vein or the portal vein.  Spleen:  Within normal limits in size and appearance.  Adrenal Glands:  No mass identified.  Kidneys:  No masses identified.  No evidence of hydronephrosis.  Lymph Nodes: A 13 mm portacaval lymph node is seen on image 17. Mild lymphadenopathy is seen gastrohepatic ligament and central small bowel mesentery with lymph nodes measuring up to 10 mm. This is consistent with metastatic disease.  Bowel: Visualized loops within the abdomen are unremarkable.  Vascular:  No evidence of abdominal aortic aneurysm.  Musculoskeletal:  No suspicious bone lesions identified.  Other: Multiple tiny sub-cm pulmonary nodules are seen in both lung bases, largest in the left lung base measuring 7 mm on image 3. These are suspicious for pulmonary metastases.  IMPRESSION: 3.8 cm mass in the pancreatic head uncinate process, consistent with pancreatic adenocarcinoma.  Mild peripancreatic and mesenteric lymphadenopathy within upper abdomen, consistent with metastatic disease.  Tiny sub-cm pulmonary nodule seen in both lung bases, suspicious for pulmonary metastases. Chest CT is recommended for further evaluation.   Electronically Signed   By: Earle Gell M.D.   On: 06/22/2014 12:39   Dg Ercp Biliary & Pancreatic Ducts  06/23/2014   CLINICAL DATA:  Common bile duct stent placement  EXAM: ERCP  TECHNIQUE: Multiple spot images obtained with the fluoroscopic device and submitted for interpretation post-procedure.  COMPARISON:  CT 06/22/2014  FINDINGS: An ERCP was performed to with cannulation of the common bile duct and pancreatic duct. The proximal common bile duct and  intrahepatic ducts are markedly dilated. On subsequent images a common bile duct stent is placed within the strictured portion of the distal CBD. On the final images the bile ducts appear partially decompressed.  IMPRESSION: Interval stent placement and decompression of the bile ducts  These images were submitted for radiologic interpretation only. Please see the procedural report for the amount of contrast and the fluoroscopy time utilized.   Electronically Signed   By: Kerby Moors M.D.   On: 06/23/2014 13:54      Assessment & Plan   1) Pancreatic uncinate mass concerning for pancreatic adenocarcinoma.   2) Biliary stricture with obstruction secondary to #1. S/P ERCP with biliary stent placement yesterday. LFTs and t bili have improved. N/V might be secondary to GA . Check lipase today to R/O pancreatitis although she does not have abd pain or abd tenderness today. Continue clear liquids for now.   Dr. Benson Norway to assume care tomorrow. Consider EUS with FNA as next step.   Principal Problem:   Biliary obstruction Active Problems:   Elevated BP   Pancreatic mass   Nonspecific elevation of levels of transaminase or lactic acid dehydrogenase (LDH)   Nonspecific (abnormal)  findings on radiological and other examination of biliary tract    LOS: 2 days   Norberto Sorenson T. Fuller Plan MD 06/24/2014, 8:57 AM

## 2014-06-25 ENCOUNTER — Encounter (HOSPITAL_COMMUNITY): Payer: Self-pay | Admitting: Gastroenterology

## 2014-06-25 LAB — COMPREHENSIVE METABOLIC PANEL
ALK PHOS: 428 U/L — AB (ref 39–117)
ALT: 460 U/L — AB (ref 0–35)
ANION GAP: 15 (ref 5–15)
AST: 206 U/L — ABNORMAL HIGH (ref 0–37)
Albumin: 3.4 g/dL — ABNORMAL LOW (ref 3.5–5.2)
BUN: 6 mg/dL (ref 6–23)
CO2: 25 meq/L (ref 19–32)
Calcium: 9.4 mg/dL (ref 8.4–10.5)
Chloride: 100 mEq/L (ref 96–112)
Creatinine, Ser: 0.57 mg/dL (ref 0.50–1.10)
GLUCOSE: 132 mg/dL — AB (ref 70–99)
Potassium: 3.4 mEq/L — ABNORMAL LOW (ref 3.7–5.3)
SODIUM: 140 meq/L (ref 137–147)
TOTAL PROTEIN: 7.3 g/dL (ref 6.0–8.3)
Total Bilirubin: 1.1 mg/dL (ref 0.3–1.2)

## 2014-06-25 LAB — CBC
HCT: 37.1 % (ref 36.0–46.0)
Hemoglobin: 12.3 g/dL (ref 12.0–15.0)
MCH: 23.7 pg — ABNORMAL LOW (ref 26.0–34.0)
MCHC: 33.2 g/dL (ref 30.0–36.0)
MCV: 71.5 fL — ABNORMAL LOW (ref 78.0–100.0)
Platelets: 337 10*3/uL (ref 150–400)
RBC: 5.19 MIL/uL — ABNORMAL HIGH (ref 3.87–5.11)
RDW: 15.1 % (ref 11.5–15.5)
WBC: 10 10*3/uL (ref 4.0–10.5)

## 2014-06-25 LAB — IRON AND TIBC
Iron: 44 ug/dL (ref 42–135)
Saturation Ratios: 15 % — ABNORMAL LOW (ref 20–55)
TIBC: 301 ug/dL (ref 250–470)
UIBC: 257 ug/dL (ref 125–400)

## 2014-06-25 LAB — PREALBUMIN: PREALBUMIN: 24 mg/dL (ref 17.0–34.0)

## 2014-06-25 LAB — FERRITIN: Ferritin: 205 ng/mL (ref 10–291)

## 2014-06-25 MED ORDER — OXYCODONE HCL 5 MG PO TABS
5.0000 mg | ORAL_TABLET | ORAL | Status: DC | PRN
Start: 2014-06-25 — End: 2014-06-28

## 2014-06-25 MED ORDER — GLIPIZIDE 5 MG PO TABS
2.5000 mg | ORAL_TABLET | Freq: Two times a day (BID) | ORAL | Status: DC
Start: 2014-06-25 — End: 2014-06-28

## 2014-06-25 MED ORDER — SODIUM CHLORIDE 0.9 % IV SOLN: INTRAVENOUS | Status: DC

## 2014-06-25 MED ORDER — ONDANSETRON HCL 4 MG PO TABS: 4.0000 mg | ORAL_TABLET | Freq: Three times a day (TID) | ORAL | Status: DC | PRN

## 2014-06-25 MED ORDER — AMLODIPINE BESYLATE 10 MG PO TABS: 10.0000 mg | ORAL_TABLET | Freq: Every day | ORAL | Status: DC

## 2014-06-25 NOTE — Progress Notes (Signed)
AVS discharge instructions were given and went over with patient. Patient was also given prescription for oxycodone to take to her pharmacy. Patient was told about the prescriptions norvasc, glipizide, and zofran to pick up at her pharmacy. Patient stated that she did not have any questions. Staff will assist patient to her transportation.

## 2014-06-25 NOTE — Progress Notes (Signed)
States BP meds makes her sick repeat BP 153/70 .down from 170/77.

## 2014-06-25 NOTE — Progress Notes (Signed)
I will perform an EUS with FNA tomorrow.

## 2014-06-25 NOTE — Discharge Summary (Addendum)
Physician Discharge Summary  Jacqueline Hahn MRN: [id number] DOB/AGE: Sep 29, 1946 68 y.o.  PCP: Annye Asa, MD   Admit date: 06/22/2014 Discharge date: 06/25/2014  Discharge Diagnoses:  Probable Pancreatic adenocarcinoma   Biliary obstruction Status post ERCP   Elevated BP   Pancreatic mass   Nonspecific elevation of levels of transaminase or lactic acid dehydrogenase (LDH)   Nonspecific (abnormal) findings on radiological and other examination of biliary tract   Followup recommendations Followup with GI Dr. Saralyn Pilar hung this week for EUS with biopsy Followup with oncology after biopsy results are available Followup with PCP Follow up CBC and CMP in 1 week    Medication List         amLODipine 10 MG tablet  Commonly known as:  NORVASC  Take 1 tablet (10 mg total) by mouth daily.     lansoprazole 15 MG capsule  Commonly known as:  PREVACID  Take 15 mg by mouth daily at 12 noon.     ondansetron 4 MG tablet  Commonly known as:  ZOFRAN  Take 1 tablet (4 mg total) by mouth every 8 (eight) hours as needed for nausea or vomiting.     oxyCODONE 5 MG immediate release tablet  Commonly known as:  Oxy IR/ROXICODONE  Take 1 tablet (5 mg total) by mouth every 4 (four) hours as needed for moderate pain.        Discharge Condition: Stable  Disposition: 01-Home or Self Care   Consults:  Gastroenterology Oncology   Significant Diagnostic Studies: Ct Chest W Contrast  06/24/2014   CLINICAL DATA:  Pancreatic mass  EXAM: CT CHEST WITH CONTRAST  TECHNIQUE: Multidetector CT imaging of the chest was performed during intravenous contrast administration.  CONTRAST:  13m OMNIPAQUE IOHEXOL 300 MG/ML  SOLN  COMPARISON:  CT abdomen and pelvis which included lung bases June 22, 2014  FINDINGS: There is scarring in the right apex. There are multiple 1- 3 mm nodular opacities throughout the lungs bilaterally. There is an ill-defined opacity in the posterior segment of the right  upper lobe measuring 8 x 7 mm seen on axial slice 12 series 3. On axial slice 22 series 3, there is a 6 by 6 mm nodular opacity in the superior segment of the right lower lobe. There is a second nodular opacity on this slice in the superior segment right upper lobe more peripherally measuring 6 x 6 mm. There is a nodular opacity on slice 28 in the superior segment of the right lower lobe measuring 7 x 5 mm. On axial slice 34 series 3, there is a nodular lesion measuring 10 x 9 mm in the medial segment of the right lower lobe. On axial slice 39 series 3, there is a nodular lesion in the posterior segment of the right lower lobe measuring 10 x 6 mm. There is an area of apparent consolidation in the left lower lobe. Just inferior to this consolidation, there is a nodular opacity in the posterior segment of the left lower lobe measuring there is also a 5 x 4 mm nodular opacity in the lateral segment left lower lobe on this same slice.  The right subclavian artery arises from the aorta and tracks posterior to the trachea and esophagus, an anatomic variant.  There are several small mediastinal lymph nodes, but no frank adenopathy by size criteria. Pericardium is slightly thickened.  There is mild inhomogeneity in the right lobe of the thyroid.  In the visualized upper abdomen, There is evidence of  air in the biliary ductal system, presumably due to recent sphincterotomy. Hemangioma in the liver is again noted.  No blastic or lytic bone lesions are appreciable. There is mild anterior wedging of the L1 vertebral body. There is degenerative change at several levels in the thoracic region.  IMPRESSION: Multiple nodular opacities throughout the lungs suspicious for metastatic foci. Most of the nodular lesions are 1-3 mm in size and are seen in multiple regions bilaterally. Slightly larger nodular lesions are present as well as noted above. All focal nodular lesions are 1 cm or less in size.  There is a focal area of airspace  consolidation in the left lower lobe.  There is a small pericardial effusion.  No appreciable thoracic adenopathy.  Origin of the right subclavian artery from the region of the aortic arch distal to the other great vessels with the right subclavian artery tracking posteriorly to the esophagus and trachea.  Mild inhomogeneity in the right lobe of the thyroid, probably indicative of multinodular goiter.   Electronically Signed   By: Lowella Grip M.D.   On: 06/24/2014 15:32   US Abdomen Complete  06/22/2014   CLINICAL DATA:  RIGHT upper quadrant pain, history hypertension  EXAM: ULTRASOUND ABDOMEN COMPLETE  COMPARISON:  None  FINDINGS: Gallbladder:  Contains low-level echogenic material throughout compatible with sludge. No shadowing calculi, gallbladder wall thickening, pericholecystic fluid or sonographic Murphy sign.  Common bile duct:  Diameter: Dilated 13 mm diameter.  Liver:  Echogenic, likely fatty infiltration, though this can be seen with cirrhosis and certain infiltrative disorders. No focal hepatic mass or nodularity. Central intrahepatic biliary dilatation.  IVC:  Normal appearance  Pancreas:  Focal enlargement of the pancreatic head, 3.5 x 2.8 x 3.4 cm, concerning for pancreatic mass/ tumor. Remainder of pancreas demonstrates normal caliber.  Spleen:  Normal appearance, 4.6 cm length  Right Kidney:  Length: 9.8 cm.  Normal morphology without mass or hydronephrosis.  Left Kidney:  Length: 10.4 cm.  Normal morphology without mass or hydronephrosis.  Abdominal aorta:  Normal caliber  Other findings:  No free fluid  IMPRESSION: Sludge within gallbladder without definite gallstones.  Intrahepatic and extrahepatic biliary dilatation, CBD up to 13 mm diameter with potential pancreatic head mass, with a 3.5 x 2.8 x 3.4 cm diameter area of enlargement seen at pancreatic head; dedicated CT imaging of the abdomen with and without contrast (pancreatic protocol) recommended to assess for potential pancreatic  head neoplasm.  Findings called to Dr. Leonides Schanz on 06/22/2014 at 0919 hr.   Electronically Signed   By: Lavonia Dana M.D.   On: 06/22/2014 09:20   Ct Abd Wo & W Cm  06/22/2014   CLINICAL DATA:  Right-sided abdominal pain. Biliuria. Biliary dilatation and pancreatic mass on ultrasound.  EXAM: CT ABDOMEN WITHOUT AND WITH CONTRAST  TECHNIQUE: Multidetector CT imaging of the abdomen was performed following the standard protocol before and following the bolus administration of intravenous contrast.  CONTRAST:  170m OMNIPAQUE IOHEXOL 300 MG/ML  SOLN  COMPARISON:  Ultrasound on 06/22/2014  FINDINGS: Liver: 2.5 cm benign hemangioma is seen in the medial segment left hepatic lobe. No other liver masses are identified. Diffuse intrahepatic biliary ductal dilatation is seen.  Gallbladder/Biliary: Gallbladder is unremarkable. Diffuse biliary ductal dilatation is seen with common bile duct measuring approximately 15 mm.  Pancreas: Ill-defined solid mass is seen in the pancreatic head and uncinate process which measures approximately 3.3 x 3.8 cm on image 20 of series 7. There is distal pancreatic atrophy  and ductal dilatation. This is consistent with pancreatic adenocarcinoma. This mass does not involve the superior mesenteric artery or vein or the portal vein.  Spleen:  Within normal limits in size and appearance.  Adrenal Glands:  No mass identified.  Kidneys:  No masses identified.  No evidence of hydronephrosis.  Lymph Nodes: A 13 mm portacaval lymph node is seen on image 17. Mild lymphadenopathy is seen gastrohepatic ligament and central small bowel mesentery with lymph nodes measuring up to 10 mm. This is consistent with metastatic disease.  Bowel: Visualized loops within the abdomen are unremarkable.  Vascular:  No evidence of abdominal aortic aneurysm.  Musculoskeletal:  No suspicious bone lesions identified.  Other: Multiple tiny sub-cm pulmonary nodules are seen in both lung bases, largest in the left lung base  measuring 7 mm on image 3. These are suspicious for pulmonary metastases.  IMPRESSION: 3.8 cm mass in the pancreatic head uncinate process, consistent with pancreatic adenocarcinoma.  Mild peripancreatic and mesenteric lymphadenopathy within upper abdomen, consistent with metastatic disease.  Tiny sub-cm pulmonary nodule seen in both lung bases, suspicious for pulmonary metastases. Chest CT is recommended for further evaluation.   Electronically Signed   By: Earle Gell M.D.   On: 06/22/2014 12:39   Dg Ercp Biliary & Pancreatic Ducts  06/23/2014   CLINICAL DATA:  Common bile duct stent placement  EXAM: ERCP  TECHNIQUE: Multiple spot images obtained with the fluoroscopic device and submitted for interpretation post-procedure.  COMPARISON:  CT 06/22/2014  FINDINGS: An ERCP was performed to with cannulation of the common bile duct and pancreatic duct. The proximal common bile duct and intrahepatic ducts are markedly dilated. On subsequent images a common bile duct stent is placed within the strictured portion of the distal CBD. On the final images the bile ducts appear partially decompressed.  IMPRESSION: Interval stent placement and decompression of the bile ducts  These images were submitted for radiologic interpretation only. Please see the procedural report for the amount of contrast and the fluoroscopy time utilized.   Electronically Signed   By: Kerby Moors M.D.   On: 06/23/2014 13:54      Microbiology: No results found for this or any previous visit (from the past 240 hour(s)).   Labs: Results for orders placed during the hospital encounter of 06/22/14 (from the past 48 hour(s))  COMPREHENSIVE METABOLIC PANEL     Status: Abnormal   Collection Time    06/24/14  5:03 AM      Result Value Ref Range   Sodium 133 (*) 137 - 147 mEq/L   Comment: DELTA CHECK NOTED   Potassium 3.9  3.7 - 5.3 mEq/L   Chloride 96  96 - 112 mEq/L   CO2 22  19 - 32 mEq/L   Glucose, Bld 132 (*) 70 - 99 mg/dL   BUN 7   6 - 23 mg/dL   Creatinine, Ser 0.58  0.50 - 1.10 mg/dL   Calcium 9.0  8.4 - 10.5 mg/dL   Total Protein 7.1  6.0 - 8.3 g/dL   Albumin 3.2 (*) 3.5 - 5.2 g/dL   AST 174 (*) 0 - 37 U/L   ALT 470 (*) 0 - 35 U/L   Alkaline Phosphatase 473 (*) 39 - 117 U/L   Total Bilirubin 1.2  0.3 - 1.2 mg/dL   GFR calc non Af Amer >90  >90 mL/min   GFR calc Af Amer >90  >90 mL/min   Comment: (NOTE)     The  eGFR has been calculated using the CKD EPI equation.     This calculation has not been validated in all clinical situations.     eGFR's persistently <90 mL/min signify possible Chronic Kidney     Disease.   Anion gap 15  5 - 15  CBC     Status: Abnormal   Collection Time    06/24/14  5:03 AM      Result Value Ref Range   WBC 8.9  4.0 - 10.5 K/uL   RBC 4.66  3.87 - 5.11 MIL/uL   Hemoglobin 10.7 (*) 12.0 - 15.0 g/dL   HCT 33.6 (*) 36.0 - 46.0 %   MCV 72.1 (*) 78.0 - 100.0 fL   MCH 23.0 (*) 26.0 - 34.0 pg   MCHC 31.8  30.0 - 36.0 g/dL   RDW 15.2  11.5 - 15.5 %   Platelets 292  150 - 400 K/uL  LIPASE, BLOOD     Status: Abnormal   Collection Time    06/24/14  5:31 AM      Result Value Ref Range   Lipase 71 (*) 11 - 59 U/L  FERRITIN     Status: None   Collection Time    06/25/14  6:02 AM      Result Value Ref Range   Ferritin 205  10 - 291 ng/mL   Comment: Performed at [facility]     Status: Abnormal   Collection Time    06/25/14  9:15 AM      Result Value Ref Range   Sodium 140  137 - 147 mEq/L   Potassium 3.4 (*) 3.7 - 5.3 mEq/L   Chloride 100  96 - 112 mEq/L   CO2 25  19 - 32 mEq/L   Glucose, Bld 132 (*) 70 - 99 mg/dL   BUN 6  6 - 23 mg/dL   Creatinine, Ser 0.57  0.50 - 1.10 mg/dL   Calcium 9.4  8.4 - 10.5 mg/dL   Total Protein 7.3  6.0 - 8.3 g/dL   Albumin 3.4 (*) 3.5 - 5.2 g/dL   AST 206 (*) 0 - 37 U/L   ALT 460 (*) 0 - 35 U/L   Alkaline Phosphatase 428 (*) 39 - 117 U/L   Total Bilirubin 1.1  0.3 - 1.2 mg/dL   GFR calc non Af Amer >90   >90 mL/min   GFR calc Af Amer >90  >90 mL/min   Comment: (NOTE)     The eGFR has been calculated using the CKD EPI equation.     This calculation has not been validated in all clinical situations.     eGFR's persistently <90 mL/min signify possible Chronic Kidney     Disease.   Anion gap 15  5 - 15  CBC     Status: Abnormal   Collection Time    06/25/14  9:15 AM      Result Value Ref Range   WBC 10.0  4.0 - 10.5 K/uL   RBC 5.19 (*) 3.87 - 5.11 MIL/uL   Hemoglobin 12.3  12.0 - 15.0 g/dL   HCT 37.1  36.0 - 46.0 %   MCV 71.5 (*) 78.0 - 100.0 fL   MCH 23.7 (*) 26.0 - 34.0 pg   MCHC 33.2  30.0 - 36.0 g/dL   RDW 15.1  11.5 - 15.5 %   Platelets 337  150 - 400 K/uL     HPI :Ms. Balthazar is a  very charming 68 year old,  African American female. She has been having some increasing abdominal pain over the past 4-5  days. She has denied any kind of weight loss. She has had a little  nausea but no vomiting. She had noted that her urine was dark that her  stools were light pink color.  She also has recently developed diabetes. She came to the emergency  room. There may have been some dehydration. The patient had a CT scan  done of the abdomen and pelvis. This showed a ill-defined pancreatic  mass in the head of the pancreas and uncinate process. It measured 3.3  x 3.8 cm. There is distal pancreatic atrophy and ductal dilation. The  mass does not involve the superior mesenteric artery or vein. She had  diffuse biliary ductal dilatation. She had mild lymphadenopathy in the  gastrohepatic region and small bowel mesenteries. She had multiple tiny  subcentimeter pulmonary nodules. Again these were "suspicious" for  metastasis.   Hospital course  pancreatic mass associated with obstructive jaundice She was admitted. She was started on IV fluids. Gastroenterology saw  her. She did have ERCP and stent placement on 8/22.  This was done without complication.  Her bilirubin subsequently trended down.  When she was admitted her bilirubin was  3.4. Alkaline phosphatase was 595. Her SGPT was 683 and SGOT was 405.  Albumin 3.8.  Today, her bilirubin is 1.2. SGPT 470, SGOT 170.  Alkaline phosphatase is 473. Albumin is 3.2.  She had mild anemia.  Marland Kitchen  Her CA19-9, unfortunately, is 7700.   She was seen by oncology who recommended CT of the chest which showed Multiple nodular opacities throughout the lungs suspicious for metastatic foci. Most of the nodular lesions are 1-3 mm in size and  are seen in multiple regions bilaterally. Slightly larger nodular  lesions are present as well as noted above. All focal nodular  lesions are 1 cm or less in size.  Also recommend Port-A-Cath placement in the future She needs to followup with Dr. Saralyn Pilar hung for EUS with FNA, Dr. Benson Norway will setup this appointment Patient disappointed that this was not done prior to discharge  Hypertension  She was on no medications prior to discharge Started on Norvasc  Diabetes mellitus  Hemoglobin A1c 8.3 on 6/18  Start the patient on glipizide 2.5 mg twice a day    Discharge Exam  Blood pressure 152/81, pulse 82, temperature 98.3 F (36.8 C), temperature source Oral, resp. rate 17, height 5' (1.524 m), weight 65.318 kg (144 lb), SpO2 100.00%.  General: Alert, Well-developed, in NAD  Heart: Regular rate and rhythm; no murmurs  Abdomen: Soft, nontender and nondistended. Normal bowel sounds, without guarding, and without rebound.  Extremities: Without edema.  Neurologic: Alert and oriented x4; grossly normal neurologically.  Psych: Alert and cooperative. Normal mood and affect        Discharge Instructions   Diet - low sodium heart healthy    Complete by:  As directed      Increase activity slowly    Complete by:  As directed            Follow-up Information   Follow up with Annye Asa, MD. Schedule an appointment as soon as possible for a visit in 1 week.   Specialty:  Family Medicine    Contact information:   [phone] [address] [phone]       Follow up with Beryle Beams, MD. Schedule an appointment as soon  as possible for a visit in 1 week. (Please call his office to set up EUS)    Specialty:  Gastroenterology   Contact information:   [address] [place] [address] [phone]       Signed: Reyne Dumas 06/25/2014, 1:31 PM

## 2014-06-25 NOTE — Plan of Care (Signed)
Problem: Discharge Progression Outcomes Goal: Pain controlled with appropriate interventions Outcome: Completed/Met Date Met:  06/25/14 Patient voiced  that she has not had any pain.

## 2014-06-25 NOTE — Care Management Note (Signed)
  Page 1 of 1   06/25/2014     2:07:18 PM CARE MANAGEMENT NOTE 06/25/2014  Patient:  Jacqueline Hahn, Jacqueline Hahn   Account Number:  [id number]  Date Initiated:  06/25/2014  Documentation initiated by:  Magdalen Spatz  Subjective/Objective Assessment:     Action/Plan:   Anticipated DC Date:  06/25/2014   Anticipated DC Plan:  HOME/SELF CARE         Choice offered to / List presented to:             Status of service:   Medicare Important Message given?  YES (If response is "NO", the following Medicare IM given date fields will be blank) Date Medicare IM given:  06/25/2014 Medicare IM given by:  Magdalen Spatz Date Additional Medicare IM given:   Additional Medicare IM given by:    Discharge Disposition:    Per UR Regulation:    If discussed at Long Length of Stay Meetings, dates discussed:    Comments:

## 2014-06-26 SURGERY — UPPER ENDOSCOPIC ULTRASOUND (EUS) LINEAR
Anesthesia: Moderate Sedation | Laterality: Left

## 2014-06-27 ENCOUNTER — Encounter: Payer: Medicare Other | Admitting: Internal Medicine

## 2014-06-27 ENCOUNTER — Telehealth: Payer: Self-pay

## 2014-06-27 ENCOUNTER — Other Ambulatory Visit: Payer: Self-pay | Admitting: Gastroenterology

## 2014-06-27 NOTE — Telephone Encounter (Signed)
Opened chart in error.

## 2014-06-27 NOTE — Telephone Encounter (Signed)
Unable to reach patient due to invalid numbers listed.  Tried all numbers listed, both numbers invalid.

## 2014-06-28 ENCOUNTER — Encounter (HOSPITAL_COMMUNITY): Payer: Self-pay | Admitting: Pharmacy Technician

## 2014-06-29 ENCOUNTER — Encounter (HOSPITAL_COMMUNITY): Payer: Medicare Other | Admitting: Anesthesiology

## 2014-06-29 ENCOUNTER — Ambulatory Visit (HOSPITAL_COMMUNITY)
Admission: RE | Admit: 2014-06-29 | Discharge: 2014-06-29 | Disposition: A | Discharge status: Home / Self Care | Payer: Medicare Other | Source: Ambulatory Visit | Attending: Gastroenterology | Admitting: Gastroenterology

## 2014-06-29 ENCOUNTER — Ambulatory Visit (HOSPITAL_COMMUNITY): Payer: Medicare Other | Admitting: Anesthesiology

## 2014-06-29 ENCOUNTER — Encounter (HOSPITAL_COMMUNITY)
Admission: RE | Disposition: A | Discharge status: Home / Self Care | Payer: Self-pay | Source: Ambulatory Visit | Attending: Gastroenterology

## 2014-06-29 ENCOUNTER — Encounter (HOSPITAL_COMMUNITY): Payer: Self-pay | Admitting: *Deleted

## 2014-06-29 DIAGNOSIS — E119 Type 2 diabetes mellitus without complications: Secondary | ICD-10-CM | POA: Diagnosis not present

## 2014-06-29 DIAGNOSIS — Z87891 Personal history of nicotine dependence: Secondary | ICD-10-CM | POA: Insufficient documentation

## 2014-06-29 DIAGNOSIS — I10 Essential (primary) hypertension: Secondary | ICD-10-CM | POA: Insufficient documentation

## 2014-06-29 DIAGNOSIS — K219 Gastro-esophageal reflux disease without esophagitis: Secondary | ICD-10-CM | POA: Insufficient documentation

## 2014-06-29 DIAGNOSIS — K869 Disease of pancreas, unspecified: Secondary | ICD-10-CM | POA: Diagnosis present

## 2014-06-29 DIAGNOSIS — C250 Malignant neoplasm of head of pancreas: Secondary | ICD-10-CM | POA: Insufficient documentation

## 2014-06-29 HISTORY — DX: Other specified postprocedural states: Z98.890

## 2014-06-29 HISTORY — DX: Adverse effect of unspecified anesthetic, initial encounter: T41.45XA

## 2014-06-29 HISTORY — DX: Nausea with vomiting, unspecified: R11.2

## 2014-06-29 HISTORY — PX: EUS: SHX5427

## 2014-06-29 HISTORY — DX: Other complications of anesthesia, initial encounter: T88.59XA

## 2014-06-29 HISTORY — PX: FINE NEEDLE ASPIRATION: SHX5430

## 2014-06-29 SURGERY — UPPER ENDOSCOPIC ULTRASOUND (EUS) LINEAR
Anesthesia: Monitor Anesthesia Care

## 2014-06-29 MED ORDER — KETAMINE HCL 10 MG/ML IJ SOLN
INTRAMUSCULAR | Status: AC
  Filled 2014-06-29: qty 1

## 2014-06-29 MED ORDER — PROPOFOL 10 MG/ML IV BOLUS
INTRAVENOUS | Status: AC
  Filled 2014-06-29: qty 20

## 2014-06-29 MED ORDER — BUTAMBEN-TETRACAINE-BENZOCAINE 2-2-14 % EX AERO
INHALATION_SPRAY | CUTANEOUS | Status: DC | PRN
  Administered 2014-06-29: 2 via TOPICAL

## 2014-06-29 MED ORDER — LIDOCAINE HCL (CARDIAC) 20 MG/ML IV SOLN
INTRAVENOUS | Status: DC | PRN
  Administered 2014-06-29: 100 mg via INTRAVENOUS

## 2014-06-29 MED ORDER — FENTANYL CITRATE 0.05 MG/ML IJ SOLN
INTRAMUSCULAR | Status: DC | PRN
  Administered 2014-06-29: 50 ug via INTRAVENOUS

## 2014-06-29 MED ORDER — PROPOFOL INFUSION 10 MG/ML OPTIME
INTRAVENOUS | Status: DC | PRN
  Administered 2014-06-29: 140 ug/kg/min via INTRAVENOUS

## 2014-06-29 MED ORDER — LACTATED RINGERS IV SOLN
INTRAVENOUS | Status: DC
  Administered 2014-06-29: 500 mL via INTRAVENOUS

## 2014-06-29 MED ORDER — SODIUM CHLORIDE 0.9 % IV SOLN: INTRAVENOUS | Status: DC

## 2014-06-29 MED ORDER — LABETALOL HCL 5 MG/ML IV SOLN
INTRAVENOUS | Status: DC | PRN
  Administered 2014-06-29 (×2): 2.5 mg via INTRAVENOUS
  Administered 2014-06-29: 5 mg via INTRAVENOUS

## 2014-06-29 MED ORDER — FENTANYL CITRATE 0.05 MG/ML IJ SOLN
INTRAMUSCULAR | Status: AC
  Filled 2014-06-29: qty 2

## 2014-06-29 MED ORDER — KETAMINE HCL 10 MG/ML IJ SOLN
INTRAMUSCULAR | Status: DC | PRN
  Administered 2014-06-29: 20 mg via INTRAVENOUS
  Administered 2014-06-29: 10 mg via INTRAVENOUS

## 2014-06-29 MED ORDER — LIDOCAINE HCL (CARDIAC) 20 MG/ML IV SOLN
INTRAVENOUS | Status: AC
  Filled 2014-06-29: qty 5

## 2014-06-29 NOTE — Interval H&P Note (Signed)
History and Physical Interval Note:  06/29/2014 8:08 AM  Jacqueline Hahn  has presented today for surgery, with the diagnosis of pancreatic cancer  The various methods of treatment have been discussed with the patient and family. After consideration of risks, benefits and other options for treatment, the patient has consented to  Procedure(s): UPPER ENDOSCOPIC ULTRASOUND (EUS) LINEAR (N/A) FINE NEEDLE ASPIRATION (FNA) LINEAR (N/A) as a surgical intervention .  The patient's history has been reviewed, patient examined, no change in status, stable for surgery.  I have reviewed the patient's chart and labs.  Questions were answered to the patient's satisfaction.     Coda Mathey D

## 2014-06-29 NOTE — Anesthesia Postprocedure Evaluation (Signed)
  Anesthesia Post-op Note  Patient: Jacqueline Hahn  Procedure(s) Performed: Procedure(s) (LRB): UPPER ENDOSCOPIC ULTRASOUND (EUS) LINEAR (N/A) FINE NEEDLE ASPIRATION (FNA) LINEAR (N/A)  Patient Location: PACU  Anesthesia Type: MAC  Level of Consciousness: awake and alert   Airway and Oxygen Therapy: Patient Spontanous Breathing  Post-op Pain: mild  Post-op Assessment: Post-op Vital signs reviewed, Patient's Cardiovascular Status Stable, Respiratory Function Stable, Patent Airway and No signs of Nausea or vomiting  Last Vitals:  Filed Vitals:   06/29/14 1520  BP: 158/78  Pulse: 83  Temp:   Resp: 22    Post-op Vital Signs: stable   Complications: No apparent anesthesia complications

## 2014-06-29 NOTE — Op Note (Signed)
[facility] [place], [id number]   ENDOSCOPIC ULTRASOUND PROCEDURE REPORT  PATIENT: Jacqueline Hahn, Jacqueline Hahn  MR#: [id number] BIRTHDATE: 20-Dec-1945  GENDER: Female ENDOSCOPIST: Carol Ada, MD REFERRED BY: PROCEDURE DATE:  06/29/2014 PROCEDURE:   Upper EUS w/FNA ASA CLASS:      Class III INDICATIONS:   Pancreatic head mass MEDICATIONS: MAC sedation, administered by CRNA  DESCRIPTION OF PROCEDURE:   After the risks benefits and alternatives of the procedure were  explained, informed consent was obtained. The patient was then placed in the left, lateral, decubitus postion and IV sedation was administered. Throughout the procedure, the patients blood pressure, pulse and oxygen saturations were monitored continuously.  Under direct visualization, the Pentax EUS Linear [id number]  endoscope was introduced through the mouth  and advanced to the bulb of duodenum .  Water was used as necessary to provide an acoustic interface. Upon completion of the imaging, water was removed and the patient was sent to the recovery room in satisfactory condition.    FINDINGS:  In the head of the pancreas a large irregularly bordered hypoechoic mass was identified.  The measurements vary between 3 x 3 cm to 3 x 5 cm depending on the position of the echoendoscope. No evidence of any peripancreatic lymph nodes and the celiac axis was normal.  The mass was adjacent to the portal confluence.  Five passes with the 25 gauge FNA needle were performed.   The scope was then withdrawn from the patient and the procedure completed.  ENDOSCOPIC IMPRESSION: 1) 3 cm x 5 cm, at its largest dimensions, pancreatic head mass.  RECOMMENDATIONS: 1) Await FNA results.  _______________________________ eSignedCarol Ada, MD 06/29/2014 3:01 PM   CC:

## 2014-06-29 NOTE — H&P (View-Only) (Signed)
I will perform an EUS with FNA tomorrow.

## 2014-06-29 NOTE — Discharge Instructions (Addendum)

## 2014-06-29 NOTE — Anesthesia Preprocedure Evaluation (Addendum)
Anesthesia Evaluation  Patient identified by MRN, date of birth, ID band Patient awake  General Assessment Comment: Hypertension     .  UTI (urinary tract infection)     .  History of stomach ulcers     .  Diabetes mellitus without complication     Reviewed: Allergy & Precautions, H&P , NPO status , Patient's Chart, lab work & pertinent test results  History of Anesthesia Complications (+) PONV and history of anesthetic complications  Airway Mallampati: II TM Distance: >3 FB Neck ROM: Full    Dental no notable dental hx.    Pulmonary former smoker,  breath sounds clear to auscultation  Pulmonary exam normal       Cardiovascular hypertension, Rhythm:Regular Rate:Normal     Neuro/Psych negative neurological ROS  negative psych ROS   GI/Hepatic Neg liver ROS, GERD-  Medicated,  Endo/Other  diabetes, Type 2  Renal/GU negative Renal ROS  negative genitourinary   Musculoskeletal negative musculoskeletal ROS (+)   Abdominal   Peds negative pediatric ROS (+)  Hematology negative hematology ROS (+)   Anesthesia Other Findings   Reproductive/Obstetrics negative OB ROS                          Anesthesia Physical Anesthesia Plan  ASA: III  Anesthesia Plan: MAC   Post-op Pain Management:    Induction: Intravenous  Airway Management Planned:   Additional Equipment:   Intra-op Plan:   Post-operative Plan:   Informed Consent: I have reviewed the patients History and Physical, chart, labs and discussed the procedure including the risks, benefits and alternatives for the proposed anesthesia with the patient or authorized representative who has indicated his/her understanding and acceptance.   Dental advisory given  Plan Discussed with: CRNA  Anesthesia Plan Comments:         Anesthesia Quick Evaluation

## 2014-06-29 NOTE — Transfer of Care (Signed)
Immediate Anesthesia Transfer of Care Note  Patient: Jacqueline Hahn  Procedure(s) Performed: Procedure(s): UPPER ENDOSCOPIC ULTRASOUND (EUS) LINEAR (N/A) FINE NEEDLE ASPIRATION (FNA) LINEAR (N/A)  Patient Location: PACU  Anesthesia Type:MAC  Level of Consciousness: awake and oriented  Airway & Oxygen Therapy: Patient Spontanous Breathing and Patient connected to nasal cannula oxygen  Post-op Assessment: Report given to PACU RN and Post -op Vital signs reviewed and stable  Post vital signs: Reviewed and stable  Complications: No apparent anesthesia complications

## 2014-07-02 ENCOUNTER — Encounter (HOSPITAL_COMMUNITY): Payer: Self-pay | Admitting: Gastroenterology

## 2014-07-05 ENCOUNTER — Ambulatory Visit: Payer: Medicare Other

## 2014-07-05 ENCOUNTER — Other Ambulatory Visit (HOSPITAL_BASED_OUTPATIENT_CLINIC_OR_DEPARTMENT_OTHER): Payer: Medicare Other | Admitting: Lab

## 2014-07-05 ENCOUNTER — Telehealth: Payer: Self-pay | Admitting: Hematology & Oncology

## 2014-07-05 ENCOUNTER — Encounter: Payer: Self-pay | Admitting: Hematology & Oncology

## 2014-07-05 ENCOUNTER — Ambulatory Visit (HOSPITAL_BASED_OUTPATIENT_CLINIC_OR_DEPARTMENT_OTHER): Payer: Medicare Other | Admitting: Hematology & Oncology

## 2014-07-05 VITALS — BP 204/89 | HR 89 | Temp 98.6°F | Resp 14 | Ht 60.0 in | Wt 136.0 lb

## 2014-07-05 DIAGNOSIS — C259 Malignant neoplasm of pancreas, unspecified: Secondary | ICD-10-CM

## 2014-07-05 DIAGNOSIS — C787 Secondary malignant neoplasm of liver and intrahepatic bile duct: Principal | ICD-10-CM

## 2014-07-05 DIAGNOSIS — C7800 Secondary malignant neoplasm of unspecified lung: Secondary | ICD-10-CM

## 2014-07-05 LAB — CBC WITH DIFFERENTIAL (CANCER CENTER ONLY)
BASO#: 0 10*3/uL (ref 0.0–0.2)
BASO%: 0.3 % (ref 0.0–2.0)
EOS%: 3.2 % (ref 0.0–7.0)
Eosinophils Absolute: 0.3 10*3/uL (ref 0.0–0.5)
HCT: 37.9 % (ref 34.8–46.6)
HEMOGLOBIN: 12.5 g/dL (ref 11.6–15.9)
LYMPH#: 4.7 10*3/uL — ABNORMAL HIGH (ref 0.9–3.3)
LYMPH%: 45.5 % (ref 14.0–48.0)
MCH: 24.2 pg — AB (ref 26.0–34.0)
MCHC: 33 g/dL (ref 32.0–36.0)
MCV: 73 fL — AB (ref 81–101)
MONO#: 1 10*3/uL — ABNORMAL HIGH (ref 0.1–0.9)
MONO%: 9.3 % (ref 0.0–13.0)
NEUT#: 4.3 10*3/uL (ref 1.5–6.5)
NEUT%: 41.7 % (ref 39.6–80.0)
Platelets: 391 10*3/uL (ref 145–400)
RBC: 5.16 10*6/uL (ref 3.70–5.32)
RDW: 14.9 % (ref 11.1–15.7)
WBC: 10.4 10*3/uL — ABNORMAL HIGH (ref 3.9–10.0)

## 2014-07-05 MED ORDER — OXYCODONE HCL 5 MG PO TABS: ORAL_TABLET | ORAL | Status: DC

## 2014-07-05 MED ORDER — MEGESTROL ACETATE 400 MG/10ML PO SUSP: 800.0000 mg | Freq: Every day | ORAL | Status: DC

## 2014-07-05 NOTE — Telephone Encounter (Signed)
I spoke w NEW PATIENT today to remind them of their appointment with Dr. Marin Olp. Also, advised them to bring all medication bottles and insurance card information.  However, pt says she is only on blood pressure bills.

## 2014-07-06 LAB — COMPREHENSIVE METABOLIC PANEL
ALT: 51 U/L — AB (ref 0–35)
AST: 22 U/L (ref 0–37)
Albumin: 4.2 g/dL (ref 3.5–5.2)
Alkaline Phosphatase: 208 U/L — ABNORMAL HIGH (ref 39–117)
BUN: 15 mg/dL (ref 6–23)
CALCIUM: 10 mg/dL (ref 8.4–10.5)
CHLORIDE: 106 meq/L (ref 96–112)
CO2: 24 mEq/L (ref 19–32)
CREATININE: 0.67 mg/dL (ref 0.50–1.10)
Glucose, Bld: 119 mg/dL — ABNORMAL HIGH (ref 70–99)
Potassium: 3.8 mEq/L (ref 3.5–5.3)
Sodium: 140 mEq/L (ref 135–145)
Total Bilirubin: 0.5 mg/dL (ref 0.2–1.2)
Total Protein: 7.3 g/dL (ref 6.0–8.3)

## 2014-07-06 LAB — CANCER ANTIGEN 19-9: CA 19-9: 13474.4 U/mL — ABNORMAL HIGH (ref <35.0)

## 2014-07-06 LAB — LACTATE DEHYDROGENASE: LDH: 162 U/L (ref 94–250)

## 2014-07-10 ENCOUNTER — Encounter: Payer: Self-pay | Admitting: Hematology & Oncology

## 2014-07-10 ENCOUNTER — Other Ambulatory Visit: Payer: Self-pay | Admitting: *Deleted

## 2014-07-10 DIAGNOSIS — C7800 Secondary malignant neoplasm of unspecified lung: Secondary | ICD-10-CM | POA: Insufficient documentation

## 2014-07-10 DIAGNOSIS — C259 Malignant neoplasm of pancreas, unspecified: Secondary | ICD-10-CM | POA: Insufficient documentation

## 2014-07-10 DIAGNOSIS — K8689 Other specified diseases of pancreas: Secondary | ICD-10-CM

## 2014-07-10 HISTORY — DX: Malignant neoplasm of pancreas, unspecified: C25.9

## 2014-07-10 HISTORY — DX: Malignant neoplasm of pancreas, unspecified: C78.00

## 2014-07-10 MED ORDER — LIDOCAINE-PRILOCAINE 2.5-2.5 % EX CREA: 1.0000 "application " | TOPICAL_CREAM | CUTANEOUS | Status: AC | PRN

## 2014-07-10 MED ORDER — PROCHLORPERAZINE MALEATE 10 MG PO TABS: 10.0000 mg | ORAL_TABLET | Freq: Four times a day (QID) | ORAL | Status: DC | PRN

## 2014-07-10 MED ORDER — ONDANSETRON HCL 8 MG PO TABS: 8.0000 mg | ORAL_TABLET | Freq: Two times a day (BID) | ORAL | Status: DC

## 2014-07-10 MED ORDER — LORAZEPAM 0.5 MG PO TABS: 0.5000 mg | ORAL_TABLET | Freq: Four times a day (QID) | ORAL | Status: DC | PRN

## 2014-07-10 MED ORDER — DEXAMETHASONE 4 MG PO TABS: 8.0000 mg | ORAL_TABLET | Freq: Two times a day (BID) | ORAL | Status: DC

## 2014-07-10 NOTE — Progress Notes (Signed)
Hematology and Oncology Follow Up Visit  Jacqueline Hahn [id number] 05/24/1946 68 y.o. 07/10/2014   Principle Diagnosis:   Metastatic adenocarcinoma of the pancreas  Current Therapy:    Observation     Interim History:  Ms.  Hahn is in for Jacqueline Hahn first office visit. I saw Jacqueline Hahn in consultation at [facility]. She was admitted there with abdominal pain. She was a little bit jaundiced. I saw Jacqueline Hahn on August 22. She had a stent placed.  Which is in the hospital, she had staging studies done. Jacqueline Hahn CAT 1999 was 7700. She is scans done. This showed that she had metastatic disease to Jacqueline Hahn lungs. She had pulmonary nodules. She had lymphadenopathy in the abdomen and pelvis. Jacqueline Hahn looked okay. She had a hemangioma in Jacqueline Hahn liver. The  Mass measured 3.3 x 3.8cm. The distal pancreas was atrophied.  She also underwent a biopsy. The pathology report ([id number]) showed adenocarcinoma.  Jacqueline Hahn performance status is still pretty good. She is losing some weight. Jacqueline Hahn appetite is down. I'll see the prolonged some Megace to try to help with Jacqueline Hahn appetite. I will also put Jacqueline Hahn on some Creon. Other your pancreas polys not working all that well.  She wants to go back to [place]. She really has no family in the area. She moved here from [place]. She has a son who lives in [place]. I told Jacqueline Hahn that she can certainly do that. She does [place] is no worse to move back to Jacqueline Hahn that we can find Jacqueline Hahn oncologist.  Currently, Jacqueline Hahn performance status is ECOG 1       Medications: Current outpatient prescriptions:Calcium Carbonate-Vitamin D (CALCIUM + D PO), Take 1 tablet by mouth daily., Disp: , Rfl: ;  Flaxseed, Linseed, (FLAX PO), Take 1 tablet by mouth daily., Disp: , Rfl: ;  lansoprazole (PREVACID) 15 MG capsule, Take 15 mg by mouth as needed. , Disp: , Rfl: ;  Multiple Vitamin (MULTIVITAMIN WITH MINERALS) TABS tablet, Take 1 tablet by mouth daily., Disp: , Rfl:  pyridOXINE (VITAMIN B-6) 100 MG tablet, Take 100 mg by mouth daily.,  Disp: , Rfl: ;  vitamin A 8000 UNIT capsule, Take 8,000 Units by mouth daily., Disp: , Rfl: ;  vitamin E 400 UNIT capsule, Take 400 Units by mouth daily., Disp: , Rfl: ;  megestrol (MEGACE) 400 MG/10ML suspension, Take 20 mLs (800 mg total) by mouth daily., Disp: 480 mL, Rfl: 3 oxyCODONE (OXY IR/ROXICODONE) 5 MG immediate release tablet, Take 1-2 pills, if needed, every 4 hours for pain., Disp: 120 tablet, Rfl: 0  Allergies: No Known Allergies  Past Medical History, Surgical history, Social history, and Family History were reviewed and updated.  Review of Systems: As above  Physical Exam:  height is 5' (1.524 m) and weight is 136 lb (61.689 kg). Jacqueline Hahn oral temperature is 98.6 F (37 C). Jacqueline Hahn blood pressure is 204/89 and Jacqueline Hahn pulse is 89. Jacqueline Hahn respiration is 14.   Well-developed and well-nourished African female. Head and exam shows no ocular or oral lesions. Are no palpable cervical or supraclavicular lymph nodes. Lungs are clear. Cardiac exam regular in rhythm with no murmurs rubs or bruits. Abdomen soft. Has good bowel sounds. There is no fluid wave. There is no palpable abdominal mass there is no palpable liver or spleen tip. Extremities shows no clubbing, cyanosis or edema. Neurological exam shows no focal neurological deficits. Skin exam shows no rashes, ecchymosis or petechia.  Lab Results  Component Value Date   WBC 10.4* 07/05/2014   HGB 12.5  07/05/2014   HCT 37.9 07/05/2014   MCV 73* 07/05/2014   PLT 391 07/05/2014     Chemistry      Component Value Date/Time   NA 140 07/05/2014 1511   K 3.8 07/05/2014 1511   CL 106 07/05/2014 1511   CO2 24 07/05/2014 1511   BUN 15 07/05/2014 1511   CREATININE 0.67 07/05/2014 1511      Component Value Date/Time   CALCIUM 10.0 07/05/2014 1511   ALKPHOS 208* 07/05/2014 1511   AST 22 07/05/2014 1511   ALT 51* 07/05/2014 1511   BILITOT 0.5 07/05/2014 1511         Impression and Plan: Jacqueline Hahn is 68 year old female. She is metastatic adenocarcinoma of pancreas. Jacqueline Hahn CA  19-9 level today is over 13,400. I. this could indicate metastatic disease. She still had a decent performance status so she should be a would tolerate systemic chemotherapy.  Therapy reason we'll start Jacqueline Hahn on gemcitabine/Abraxane. There is a be friable tolerated.  She will need to have a Port-A-Cath placed. I will have to get his ordered.  Hopefully, we can get Jacqueline Hahn to gain a little weight. I think this will help Jacqueline Hahn.  Jacqueline Hahn liver test looked okay right now. Jacqueline Hahn prealbumin was 24, she was in the hospital.  I went over the side effects of chemotherapy. I told Jacqueline Hahn that chemotherapy is not going to cure Jacqueline Hahn. The chemotherapy has about a 25 have a third percent chance of shrinking the tumor a little bit. Again, she understands that she is not curable.  For Jacqueline Hahn that even chemotherapy worse, we likely will not get Jacqueline Hahn more than a year. It chemotherapy does not work, that they would probably are looking at less than 4 months. She does understand this.  I went over the side effects of chemotherapy. I told Jacqueline Hahn about the blood counts go down. She may need to be transfused. We may have to give Jacqueline Hahn Neulasta for white cell count go down. She may have dehydration. She may have increased risk of infection. There may be some bleeding. She understands all this. She wants to go through with treatment.  Want to give a Port-A-Cath put in. [place] and had to go somewhat with chemotherapy. Chest is a chemotherapy education class.  I'll plan to get Jacqueline Hahn back to see [place] when she starts Jacqueline Hahn treatment.  She was a little bit iron deficient which is in the hospital. However, she is not anemic by any means. I spent about 45 minutes with Jacqueline Hahn today.  [name], MD 9/8/20157:35 AM

## 2014-07-11 ENCOUNTER — Encounter (HOSPITAL_COMMUNITY): Payer: Self-pay | Admitting: Pharmacy Technician

## 2014-07-12 ENCOUNTER — Other Ambulatory Visit: Payer: Medicare Other

## 2014-07-13 ENCOUNTER — Other Ambulatory Visit: Payer: Self-pay | Admitting: Radiology

## 2014-07-16 ENCOUNTER — Other Ambulatory Visit: Payer: Self-pay | Admitting: Hematology & Oncology

## 2014-07-16 ENCOUNTER — Ambulatory Visit (HOSPITAL_COMMUNITY)
Admission: RE | Admit: 2014-07-16 | Discharge: 2014-07-16 | Disposition: A | Discharge status: Home / Self Care | Payer: Medicare Other | Source: Ambulatory Visit | Attending: Hematology & Oncology | Admitting: Hematology & Oncology

## 2014-07-16 ENCOUNTER — Encounter (HOSPITAL_COMMUNITY): Payer: Self-pay

## 2014-07-16 DIAGNOSIS — C259 Malignant neoplasm of pancreas, unspecified: Secondary | ICD-10-CM

## 2014-07-16 DIAGNOSIS — Q447 Other congenital malformations of liver: Secondary | ICD-10-CM

## 2014-07-16 DIAGNOSIS — Q441 Other congenital malformations of gallbladder: Secondary | ICD-10-CM | POA: Diagnosis not present

## 2014-07-16 DIAGNOSIS — Z452 Encounter for adjustment and management of vascular access device: Secondary | ICD-10-CM | POA: Insufficient documentation

## 2014-07-16 DIAGNOSIS — C787 Secondary malignant neoplasm of liver and intrahepatic bile duct: Secondary | ICD-10-CM | POA: Insufficient documentation

## 2014-07-16 DIAGNOSIS — E119 Type 2 diabetes mellitus without complications: Secondary | ICD-10-CM | POA: Diagnosis not present

## 2014-07-16 DIAGNOSIS — C7800 Secondary malignant neoplasm of unspecified lung: Secondary | ICD-10-CM

## 2014-07-16 DIAGNOSIS — Z87891 Personal history of nicotine dependence: Secondary | ICD-10-CM | POA: Insufficient documentation

## 2014-07-16 DIAGNOSIS — I10 Essential (primary) hypertension: Secondary | ICD-10-CM | POA: Insufficient documentation

## 2014-07-16 DIAGNOSIS — Q4479 Other congenital malformations of liver: Secondary | ICD-10-CM | POA: Insufficient documentation

## 2014-07-16 DIAGNOSIS — C250 Malignant neoplasm of head of pancreas: Secondary | ICD-10-CM | POA: Diagnosis not present

## 2014-07-16 DIAGNOSIS — Q445 Other congenital malformations of bile ducts: Secondary | ICD-10-CM

## 2014-07-16 LAB — CBC WITH DIFFERENTIAL/PLATELET
Basophils Absolute: 0 10*3/uL (ref 0.0–0.1)
Basophils Relative: 0 % (ref 0–1)
EOS PCT: 3 % (ref 0–5)
Eosinophils Absolute: 0.3 10*3/uL (ref 0.0–0.7)
HCT: 41.1 % (ref 36.0–46.0)
Hemoglobin: 13.6 g/dL (ref 12.0–15.0)
LYMPHS PCT: 46 % (ref 12–46)
Lymphs Abs: 4.7 10*3/uL — ABNORMAL HIGH (ref 0.7–4.0)
MCH: 24.3 pg — ABNORMAL LOW (ref 26.0–34.0)
MCHC: 33.1 g/dL (ref 30.0–36.0)
MCV: 73.4 fL — ABNORMAL LOW (ref 78.0–100.0)
MONOS PCT: 8 % (ref 3–12)
Monocytes Absolute: 0.8 10*3/uL (ref 0.1–1.0)
Neutro Abs: 4.4 10*3/uL (ref 1.7–7.7)
Neutrophils Relative %: 43 % (ref 43–77)
PLATELETS: 384 10*3/uL (ref 150–400)
RBC: 5.6 MIL/uL — AB (ref 3.87–5.11)
RDW: 14.6 % (ref 11.5–15.5)
WBC: 10.2 10*3/uL (ref 4.0–10.5)

## 2014-07-16 LAB — PROTIME-INR
INR: 0.93 (ref 0.00–1.49)
PROTHROMBIN TIME: 12.5 s (ref 11.6–15.2)

## 2014-07-16 LAB — GLUCOSE, CAPILLARY: GLUCOSE-CAPILLARY: 124 mg/dL — AB (ref 70–99)

## 2014-07-16 MED ORDER — MIDAZOLAM HCL 2 MG/2ML IJ SOLN
INTRAMUSCULAR | Status: AC
  Filled 2014-07-16: qty 6

## 2014-07-16 MED ORDER — CEFAZOLIN SODIUM-DEXTROSE 2-3 GM-% IV SOLR
INTRAVENOUS | Status: AC
  Filled 2014-07-16: qty 50

## 2014-07-16 MED ORDER — CEFAZOLIN SODIUM-DEXTROSE 2-3 GM-% IV SOLR
2.0000 g | Freq: Once | INTRAVENOUS | Status: AC
  Administered 2014-07-16: 2 g via INTRAVENOUS

## 2014-07-16 MED ORDER — MIDAZOLAM HCL 2 MG/2ML IJ SOLN
INTRAMUSCULAR | Status: AC | PRN
  Administered 2014-07-16 (×2): 0.5 mg via INTRAVENOUS
  Administered 2014-07-16: 1 mg via INTRAVENOUS
  Administered 2014-07-16 (×4): 0.5 mg via INTRAVENOUS

## 2014-07-16 MED ORDER — SODIUM CHLORIDE 0.9 % IV SOLN
INTRAVENOUS | Status: DC
  Administered 2014-07-16: 13:00:00 via INTRAVENOUS

## 2014-07-16 MED ORDER — HEPARIN SOD (PORK) LOCK FLUSH 100 UNIT/ML IV SOLN
INTRAVENOUS | Status: AC
  Filled 2014-07-16: qty 5

## 2014-07-16 MED ORDER — LIDOCAINE HCL 1 % IJ SOLN
INTRAMUSCULAR | Status: AC
  Filled 2014-07-16: qty 20

## 2014-07-16 MED ORDER — LIDOCAINE HCL 1 % IJ SOLN
INTRAMUSCULAR | Status: DC
Start: 2014-07-16 — End: 2014-07-17
  Filled 2014-07-16: qty 20

## 2014-07-16 MED ORDER — HEPARIN SOD (PORK) LOCK FLUSH 100 UNIT/ML IV SOLN
INTRAVENOUS | Status: AC | PRN
  Administered 2014-07-16: 500 [IU]

## 2014-07-16 MED ORDER — FENTANYL CITRATE 0.05 MG/ML IJ SOLN
INTRAMUSCULAR | Status: AC
  Filled 2014-07-16: qty 6

## 2014-07-16 MED ORDER — FENTANYL CITRATE 0.05 MG/ML IJ SOLN
INTRAMUSCULAR | Status: AC | PRN
  Administered 2014-07-16 (×3): 25 ug via INTRAVENOUS
  Administered 2014-07-16: 50 ug via INTRAVENOUS

## 2014-07-16 NOTE — H&P (Signed)
Chief Complaint: "I'm here to get a port catheter"   Referring Physician(s): Ennever,Peter R  History of Present Illness: Jacqueline Hahn is a 68 y.o. female with history of metastatic pancreatic carcinoma who presents today for port a cath placement for chemotherapy.   Past Medical History  Diagnosis Date  . Hypertension   . UTI (urinary tract infection)   . History of stomach ulcers   . Diabetes mellitus without complication   . Complication of anesthesia     hx of n/v  . PONV (postoperative nausea and vomiting)   . Pancreatic cancer metastasized to lung 07/10/2014    Past Surgical History  Procedure Laterality Date  . Dental surgery    . Bunionectomy    . Ercp N/A 06/23/2014    Procedure: ENDOSCOPIC RETROGRADE CHOLANGIOPANCREATOGRAPHY (ERCP);  Surgeon: Ladene Artist, MD;  Location: [facility] ENDOSCOPY;  Service: Endoscopy;  Laterality: N/A;  . Eus N/A 06/29/2014    Procedure: UPPER ENDOSCOPIC ULTRASOUND (EUS) LINEAR;  Surgeon: Beryle Beams, MD;  Location: [facility] ENDOSCOPY;  Service: Endoscopy;  Laterality: N/A;  . Fine needle aspiration N/A 06/29/2014    Procedure: FINE NEEDLE ASPIRATION (FNA) LINEAR;  Surgeon: Beryle Beams, MD;  Location: [facility] ENDOSCOPY;  Service: Endoscopy;  Laterality: N/A;    Allergies: Review of patient's allergies indicates no known allergies.  Medications: Prior to Admission medications   Medication Sig Start Date End Date Taking? Authorizing Provider  Calcium Carbonate-Vitamin D (CALCIUM + D PO) Take 1 tablet by mouth daily.    Historical Provider, MD  dexamethasone (DECADRON) 4 MG tablet Take 2 tablets (8 mg total) by mouth 2 (two) times daily with a meal. Start the day after chemotherapy for 2 days. Take with food. 07/10/14   [name], MD  Flaxseed, Linseed, (FLAX PO) Take 1 tablet by mouth daily.    Historical Provider, MD  lansoprazole (PREVACID) 15 MG capsule Take 15 mg by mouth as needed.     Historical Provider, MD  lidocaine-prilocaine (EMLA)  cream Apply 1 application topically as needed. Apply quarter sized amount to portacath site at least one hour prior to chemo treatment 07/10/14   [name], MD  lipase/protease/amylase (CREON) 12000 UNITS CPEP capsule Take 12,000 Units by mouth every morning.    Historical Provider, MD  LORazepam (ATIVAN) 0.5 MG tablet Take 1 tablet (0.5 mg total) by mouth every 6 (six) hours as needed (Nausea or vomiting). 07/10/14   [name], MD  megestrol (MEGACE) 400 MG/10ML suspension Take 20 mLs (800 mg total) by mouth daily. 07/05/14   [name], MD  Multiple Vitamin (MULTIVITAMIN WITH MINERALS) TABS tablet Take 1 tablet by mouth daily.    Historical Provider, MD  ondansetron (ZOFRAN) 8 MG tablet Take 1 tablet (8 mg total) by mouth 2 (two) times daily. Start the day after chemo for 2 days. Then take as needed for nausea or vomiting. 07/10/14   [name], MD  pyridOXINE (VITAMIN B-6) 100 MG tablet Take 100 mg by mouth daily.    Historical Provider, MD  vitamin A 8000 UNIT capsule Take 8,000 Units by mouth daily.    Historical Provider, MD  vitamin E 400 UNIT capsule Take 400 Units by mouth daily.    Historical Provider, MD    Family History  Problem Relation Age of Onset  . Heart attack Mother   . Stroke Mother   . Heart attack Father   . Hyperlipidemia Sister   . Hypertension  Sister   . Hypertension Brother   . Alcohol abuse Brother     History   Social History  . Marital Status: Single    Spouse Name: N/A    Number of Children: N/A  . Years of Education: N/A   Social History Main Topics  . Smoking status: Former Smoker -- 1.00 packs/day for 4 years    Types: Cigarettes    Start date: 02/03/1963    Quit date: 03/05/1967  . Smokeless tobacco: Never Used     Comment: quit 45 years ago  . Alcohol Use: No  . Drug Use: No  . Sexual Activity: Not on file   Other Topics Concern  . Not on file   Social History Narrative  . No narrative on file         Review of  Systems  Constitutional: Negative for fever and chills.  Respiratory: Negative for shortness of breath.        Occ cough  Cardiovascular: Negative for chest pain.  Gastrointestinal: Negative for nausea, vomiting and blood in stool.       Occ abd pain/indigestion  Musculoskeletal: Negative for back pain.  Neurological:       Occ HA's  Hematological: Does not bruise/bleed easily.    Vital Signs: BP 161/79  Pulse 94  Temp(Src) 98.2 F (36.8 C) (Oral)  Resp 16  Ht 5' (1.524 m)  Wt 136 lb (61.689 kg)  BMI 26.56 kg/m2  SpO2 100%  Physical Exam  Constitutional: She is oriented to person, place, and time. She appears well-developed and well-nourished.  Cardiovascular: Normal rate and regular rhythm.   Pulmonary/Chest: Effort normal and breath sounds normal.  Abdominal: Soft. Bowel sounds are normal. There is no tenderness.  Musculoskeletal: Normal range of motion. She exhibits no edema.  Neurological: She is alert and oriented to person, place, and time.    Imaging: Ct Chest W Contrast  06/24/2014   CLINICAL DATA:  Pancreatic mass  EXAM: CT CHEST WITH CONTRAST  TECHNIQUE: Multidetector CT imaging of the chest was performed during intravenous contrast administration.  CONTRAST:  53mL OMNIPAQUE IOHEXOL 300 MG/ML  SOLN  COMPARISON:  CT abdomen and pelvis which included lung bases June 22, 2014  FINDINGS: There is scarring in the right apex. There are multiple 1- 3 mm nodular opacities throughout the lungs bilaterally. There is an ill-defined opacity in the posterior segment of the right upper lobe measuring 8 x 7 mm seen on axial slice 12 series 3. On axial slice 22 series 3, there is a 6 by 6 mm nodular opacity in the superior segment of the right lower lobe. There is a second nodular opacity on this slice in the superior segment right upper lobe more peripherally measuring 6 x 6 mm. There is a nodular opacity on slice 28 in the superior segment of the right lower lobe measuring 7 x 5 mm.  On axial slice 34 series 3, there is a nodular lesion measuring 10 x 9 mm in the medial segment of the right lower lobe. On axial slice 39 series 3, there is a nodular lesion in the posterior segment of the right lower lobe measuring 10 x 6 mm. There is an area of apparent consolidation in the left lower lobe. Just inferior to this consolidation, there is a nodular opacity in the posterior segment of the left lower lobe measuring there is also a 5 x 4 mm nodular opacity in the lateral segment left lower lobe on this same  slice.  The right subclavian artery arises from the aorta and tracks posterior to the trachea and esophagus, an anatomic variant.  There are several small mediastinal lymph nodes, but no frank adenopathy by size criteria. Pericardium is slightly thickened.  There is mild inhomogeneity in the right lobe of the thyroid.  In the visualized upper abdomen, There is evidence of air in the biliary ductal system, presumably due to recent sphincterotomy. Hemangioma in the liver is again noted.  No blastic or lytic bone lesions are appreciable. There is mild anterior wedging of the L1 vertebral body. There is degenerative change at several levels in the thoracic region.  IMPRESSION: Multiple nodular opacities throughout the lungs suspicious for metastatic foci. Most of the nodular lesions are 1-3 mm in size and are seen in multiple regions bilaterally. Slightly larger nodular lesions are present as well as noted above. All focal nodular lesions are 1 cm or less in size.  There is a focal area of airspace consolidation in the left lower lobe.  There is a small pericardial effusion.  No appreciable thoracic adenopathy.  Origin of the right subclavian artery from the region of the aortic arch distal to the other great vessels with the right subclavian artery tracking posteriorly to the esophagus and trachea.  Mild inhomogeneity in the right lobe of the thyroid, probably indicative of multinodular goiter.    Electronically Signed   By: Lowella Grip M.D.   On: 06/24/2014 15:32   US Abdomen Complete  06/22/2014   CLINICAL DATA:  RIGHT upper quadrant pain, history hypertension  EXAM: ULTRASOUND ABDOMEN COMPLETE  COMPARISON:  None  FINDINGS: Gallbladder:  Contains low-level echogenic material throughout compatible with sludge. No shadowing calculi, gallbladder wall thickening, pericholecystic fluid or sonographic Murphy sign.  Common bile duct:  Diameter: Dilated 13 mm diameter.  Liver:  Echogenic, likely fatty infiltration, though this can be seen with cirrhosis and certain infiltrative disorders. No focal hepatic mass or nodularity. Central intrahepatic biliary dilatation.  IVC:  Normal appearance  Pancreas:  Focal enlargement of the pancreatic head, 3.5 x 2.8 x 3.4 cm, concerning for pancreatic mass/ tumor. Remainder of pancreas demonstrates normal caliber.  Spleen:  Normal appearance, 4.6 cm length  Right Kidney:  Length: 9.8 cm.  Normal morphology without mass or hydronephrosis.  Left Kidney:  Length: 10.4 cm.  Normal morphology without mass or hydronephrosis.  Abdominal aorta:  Normal caliber  Other findings:  No free fluid  IMPRESSION: Sludge within gallbladder without definite gallstones.  Intrahepatic and extrahepatic biliary dilatation, CBD up to 13 mm diameter with potential pancreatic head mass, with a 3.5 x 2.8 x 3.4 cm diameter area of enlargement seen at pancreatic head; dedicated CT imaging of the abdomen with and without contrast (pancreatic protocol) recommended to assess for potential pancreatic head neoplasm.  Findings called to Dr. Leonides Schanz on 06/22/2014 at 0919 hr.   Electronically Signed   By: Lavonia Dana M.D.   On: 06/22/2014 09:20   Ct Abd Wo & W Cm  06/22/2014   CLINICAL DATA:  Right-sided abdominal pain. Biliuria. Biliary dilatation and pancreatic mass on ultrasound.  EXAM: CT ABDOMEN WITHOUT AND WITH CONTRAST  TECHNIQUE: Multidetector CT imaging of the abdomen was performed following the  standard protocol before and following the bolus administration of intravenous contrast.  CONTRAST:  156mL OMNIPAQUE IOHEXOL 300 MG/ML  SOLN  COMPARISON:  Ultrasound on 06/22/2014  FINDINGS: Liver: 2.5 cm benign hemangioma is seen in the medial segment left hepatic lobe. No other  liver masses are identified. Diffuse intrahepatic biliary ductal dilatation is seen.  Gallbladder/Biliary: Gallbladder is unremarkable. Diffuse biliary ductal dilatation is seen with common bile duct measuring approximately 15 mm.  Pancreas: Ill-defined solid mass is seen in the pancreatic head and uncinate process which measures approximately 3.3 x 3.8 cm on image 20 of series 7. There is distal pancreatic atrophy and ductal dilatation. This is consistent with pancreatic adenocarcinoma. This mass does not involve the superior mesenteric artery or vein or the portal vein.  Spleen:  Within normal limits in size and appearance.  Adrenal Glands:  No mass identified.  Kidneys:  No masses identified.  No evidence of hydronephrosis.  Lymph Nodes: A 13 mm portacaval lymph node is seen on image 17. Mild lymphadenopathy is seen gastrohepatic ligament and central small bowel mesentery with lymph nodes measuring up to 10 mm. This is consistent with metastatic disease.  Bowel: Visualized loops within the abdomen are unremarkable.  Vascular:  No evidence of abdominal aortic aneurysm.  Musculoskeletal:  No suspicious bone lesions identified.  Other: Multiple tiny sub-cm pulmonary nodules are seen in both lung bases, largest in the left lung base measuring 7 mm on image 3. These are suspicious for pulmonary metastases.  IMPRESSION: 3.8 cm mass in the pancreatic head uncinate process, consistent with pancreatic adenocarcinoma.  Mild peripancreatic and mesenteric lymphadenopathy within upper abdomen, consistent with metastatic disease.  Tiny sub-cm pulmonary nodule seen in both lung bases, suspicious for pulmonary metastases. Chest CT is recommended for  further evaluation.   Electronically Signed   By: Earle Gell M.D.   On: 06/22/2014 12:39   Dg Ercp Biliary & Pancreatic Ducts  06/23/2014   CLINICAL DATA:  Common bile duct stent placement  EXAM: ERCP  TECHNIQUE: Multiple spot images obtained with the fluoroscopic device and submitted for interpretation post-procedure.  COMPARISON:  CT 06/22/2014  FINDINGS: An ERCP was performed to with cannulation of the common bile duct and pancreatic duct. The proximal common bile duct and intrahepatic ducts are markedly dilated. On subsequent images a common bile duct stent is placed within the strictured portion of the distal CBD. On the final images the bile ducts appear partially decompressed.  IMPRESSION: Interval stent placement and decompression of the bile ducts  These images were submitted for radiologic interpretation only. Please see the procedural report for the amount of contrast and the fluoroscopy time utilized.   Electronically Signed   By: Kerby Moors M.D.   On: 06/23/2014 13:54    Labs: Lab Results  Component Value Date   WBC 10.4* 07/05/2014   HCT 37.9 07/05/2014   MCV 73* 07/05/2014   PLT 391 07/05/2014   NA 140 07/05/2014   K 3.8 07/05/2014   CL 106 07/05/2014   CO2 24 07/05/2014   GLUCOSE 119* 07/05/2014   BUN 15 07/05/2014   CREATININE 0.67 07/05/2014   CALCIUM 10.0 07/05/2014   PROT 7.3 07/05/2014   ALBUMIN 4.2 07/05/2014   AST 22 07/05/2014   ALT 51* 07/05/2014   ALKPHOS 208* 07/05/2014   BILITOT 0.5 07/05/2014   GFRNONAA >90 06/25/2014   GFRAA >90 06/25/2014   CA199 13474.4* 07/05/2014    Assessment and Plan: Jacqueline Hahn is a 68 y.o. female with history of metastatic pancreatic carcinoma who presents today for port a cath placement for chemotherapy. Details/risks of procedure d/w pt with her understanding and consent.

## 2014-07-16 NOTE — H&P (Signed)
Agree with above note

## 2014-07-16 NOTE — Procedures (Signed)
US/Fluoro guided Rt IJ port catheter placement.  Tip at cavo-atrial junction.  Patent Right IJ No complication. No significant blood loss Ok to use the port catheter Signed,  Johny Shears, DO

## 2014-07-16 NOTE — Discharge Instructions (Signed)
Conscious Sedation, Adult, Care After Refer to this sheet in the next few weeks. These instructions provide you with information on caring for yourself after your procedure. Your health care provider may also give you more specific instructions. Your treatment has been planned according to current medical practices, but problems sometimes occur. Call your health care provider if you have any problems or questions after your procedure. WHAT TO EXPECT AFTER THE PROCEDURE  After your procedure:  You may feel sleepy, clumsy, and have poor balance for several hours.  Vomiting may occur if you eat too soon after the procedure. HOME CARE INSTRUCTIONS  Do not participate in any activities where you could become injured for at least 24 hours. Do not:  Drive.  Swim.  Ride a bicycle.  Operate heavy machinery.  Cook.  Use power tools.  Climb ladders.  Work from a high place.  Do not make important decisions or sign legal documents until you are improved.  If you vomit, drink water, juice, or soup when you can drink without vomiting. Make sure you have little or no nausea before eating solid foods.  Only take over-the-counter or prescription medicines for pain, discomfort, or fever as directed by your health care provider.  Make sure you and your family fully understand everything about the medicines given to you, including what side effects may occur.  You should not drink alcohol, take sleeping pills, or take medicines that cause drowsiness for at least 24 hours.  If you smoke, do not smoke without supervision.  If you are feeling better, you may resume normal activities 24 hours after you were sedated.  Keep all appointments with your health care provider. SEEK MEDICAL CARE IF:  Your skin is pale or bluish in color.  You continue to feel nauseous or vomit.  Your pain is getting worse and is not helped by medicine.  You have bleeding or swelling.  You are still sleepy or  feeling clumsy after 24 hours. SEEK IMMEDIATE MEDICAL CARE IF:  You develop a rash.  You have difficulty breathing.  You develop any type of allergic problem.  You have a fever. MAKE SURE YOU:  Understand these instructions.  Will watch your condition.  Will get help right away if you are not doing well or get worse. Document Released: 08/09/2013 Document Reviewed: 08/09/2013 [facility] Patient Information 2015 [place], [place]. This information is not intended to replace advice given to you by your health care provider. Make sure you discuss any questions you have with your health care provider.  Implanted Port Insertion, Care After Refer to this sheet in the next few weeks. These instructions provide you with information on caring for yourself after your procedure. Your health care provider may also give you more specific instructions. Your treatment has been planned according to current medical practices, but problems sometimes occur. Call your health care provider if you have any problems or questions after your procedure. WHAT TO EXPECT AFTER THE PROCEDURE After your procedure, it is typical to have the following:   Discomfort at the port insertion site. Ice packs to the area will help.  Bruising on the skin over the port. This will subside in 3-4 days. HOME CARE INSTRUCTIONS  After your port is placed, you will get a manufacturer's information card. The card has information about your port. Keep this card with you at all times.   Know what kind of port you have. There are many types of ports available.   Wear a medical  alert bracelet in case of an emergency. This can help alert health care workers that you have a port.   The port can stay in for as long as your health care provider believes it is necessary.   A home health care nurse may give medicines and take care of the port.   You or a family member can get special training and directions for giving medicine and  taking care of the port at home.  SEEK MEDICAL CARE IF:   Your port does not flush or you are unable to get a blood return.   You have a fever or chills. SEEK IMMEDIATE MEDICAL CARE IF:  You have new fluid or pus coming from your incision.   You notice a bad smell coming from your incision site.   You have swelling, pain, or more redness at the incision or port site.   You have chest pain or shortness of breath. Document Released: 08/09/2013 Document Revised: 10/24/2013 Document Reviewed: 08/09/2013 [facility] Patient Information 2015 [place], [place]. This information is not intended to replace advice given to you by your health care provider. Make sure you discuss any questions you have with your health care provider.  You may shower 24 hours after procedure. Remove dressing prior to shower.

## 2014-07-18 ENCOUNTER — Ambulatory Visit (HOSPITAL_BASED_OUTPATIENT_CLINIC_OR_DEPARTMENT_OTHER): Payer: Medicare Other | Admitting: Family

## 2014-07-18 ENCOUNTER — Other Ambulatory Visit (HOSPITAL_BASED_OUTPATIENT_CLINIC_OR_DEPARTMENT_OTHER): Payer: Medicare Other | Admitting: Lab

## 2014-07-18 ENCOUNTER — Ambulatory Visit (HOSPITAL_BASED_OUTPATIENT_CLINIC_OR_DEPARTMENT_OTHER): Payer: Medicare Other

## 2014-07-18 VITALS — BP 154/90 | HR 90 | Temp 98.2°F | Resp 20 | Ht 60.0 in | Wt 134.0 lb

## 2014-07-18 DIAGNOSIS — K869 Disease of pancreas, unspecified: Secondary | ICD-10-CM

## 2014-07-18 DIAGNOSIS — C259 Malignant neoplasm of pancreas, unspecified: Secondary | ICD-10-CM

## 2014-07-18 DIAGNOSIS — C7800 Secondary malignant neoplasm of unspecified lung: Secondary | ICD-10-CM

## 2014-07-18 DIAGNOSIS — K8689 Other specified diseases of pancreas: Secondary | ICD-10-CM

## 2014-07-18 DIAGNOSIS — C787 Secondary malignant neoplasm of liver and intrahepatic bile duct: Principal | ICD-10-CM

## 2014-07-18 DIAGNOSIS — Z5111 Encounter for antineoplastic chemotherapy: Secondary | ICD-10-CM

## 2014-07-18 LAB — CBC WITH DIFFERENTIAL (CANCER CENTER ONLY)
BASO#: 0 10*3/uL (ref 0.0–0.2)
BASO%: 0.3 % (ref 0.0–2.0)
EOS%: 3.1 % (ref 0.0–7.0)
Eosinophils Absolute: 0.3 10*3/uL (ref 0.0–0.5)
HCT: 40.1 % (ref 34.8–46.6)
HGB: 13.2 g/dL (ref 11.6–15.9)
LYMPH#: 3.9 10*3/uL — AB (ref 0.9–3.3)
LYMPH%: 37.8 % (ref 14.0–48.0)
MCH: 24 pg — ABNORMAL LOW (ref 26.0–34.0)
MCHC: 32.9 g/dL (ref 32.0–36.0)
MCV: 73 fL — ABNORMAL LOW (ref 81–101)
MONO#: 1 10*3/uL — ABNORMAL HIGH (ref 0.1–0.9)
MONO%: 9.3 % (ref 0.0–13.0)
NEUT#: 5.1 10*3/uL (ref 1.5–6.5)
NEUT%: 49.5 % (ref 39.6–80.0)
Platelets: 297 10*3/uL (ref 145–400)
RBC: 5.49 10*6/uL — ABNORMAL HIGH (ref 3.70–5.32)
RDW: 15 % (ref 11.1–15.7)
WBC: 10.4 10*3/uL — AB (ref 3.9–10.0)

## 2014-07-18 LAB — CMP (CANCER CENTER ONLY)
ALK PHOS: 132 U/L — AB (ref 26–84)
ALT: 36 U/L (ref 10–47)
AST: 27 U/L (ref 11–38)
Albumin: 3.5 g/dL (ref 3.3–5.5)
BUN: 12 mg/dL (ref 7–22)
CALCIUM: 9.7 mg/dL (ref 8.0–10.3)
CO2: 25 mEq/L (ref 18–33)
CREATININE: 0.7 mg/dL (ref 0.6–1.2)
Chloride: 102 mEq/L (ref 98–108)
Glucose, Bld: 142 mg/dL — ABNORMAL HIGH (ref 73–118)
POTASSIUM: 3.2 meq/L — AB (ref 3.3–4.7)
Sodium: 137 mEq/L (ref 128–145)
Total Bilirubin: 0.5 mg/dl (ref 0.20–1.60)
Total Protein: 7.3 g/dL (ref 6.4–8.1)

## 2014-07-18 LAB — PREALBUMIN: Prealbumin: 29.6 mg/dL (ref 17.0–34.0)

## 2014-07-18 MED ORDER — HEPARIN SOD (PORK) LOCK FLUSH 100 UNIT/ML IV SOLN
500.0000 [IU] | Freq: Once | INTRAVENOUS | Status: AC
  Administered 2014-07-18: 500 [IU] via INTRAVENOUS
  Filled 2014-07-18: qty 5

## 2014-07-18 MED ORDER — ONDANSETRON 8 MG/50ML IVPB (CHCC)
8.0000 mg | Freq: Once | INTRAVENOUS | Status: AC
  Administered 2014-07-18: 8 mg via INTRAVENOUS

## 2014-07-18 MED ORDER — SODIUM CHLORIDE 0.9 % IV SOLN
Freq: Once | INTRAVENOUS | Status: AC
  Administered 2014-07-18: 13:00:00 via INTRAVENOUS

## 2014-07-18 MED ORDER — SODIUM CHLORIDE 0.9 % IJ SOLN
10.0000 mL | INTRAMUSCULAR | Status: DC | PRN
  Administered 2014-07-18: 10 mL via INTRAVENOUS
  Filled 2014-07-18: qty 10

## 2014-07-18 MED ORDER — PACLITAXEL PROTEIN-BOUND CHEMO INJECTION 100 MG
125.0000 mg/m2 | Freq: Once | INTRAVENOUS | Status: AC
  Administered 2014-07-18: 200 mg via INTRAVENOUS
  Filled 2014-07-18: qty 40

## 2014-07-18 MED ORDER — DEXAMETHASONE SODIUM PHOSPHATE 10 MG/ML IJ SOLN
10.0000 mg | Freq: Once | INTRAMUSCULAR | Status: AC
  Administered 2014-07-18: 10 mg via INTRAVENOUS

## 2014-07-18 MED ORDER — SODIUM CHLORIDE 0.9 % IV SOLN
1000.0000 mg/m2 | Freq: Once | INTRAVENOUS | Status: AC
  Administered 2014-07-18: 1634 mg via INTRAVENOUS
  Filled 2014-07-18: qty 42.98

## 2014-07-18 MED ORDER — DEXAMETHASONE SODIUM PHOSPHATE 20 MG/5ML IJ SOLN
INTRAMUSCULAR | Status: AC
  Filled 2014-07-18: qty 5

## 2014-07-18 NOTE — Patient Instructions (Addendum)
Nanoparticle Albumin-Bound Paclitaxel injection What is this medicine? NANOPARTICLE ALBUMIN-BOUND PACLITAXEL (Na no PAHR ti kuhl al BYOO muhn-bound PAK li TAX el) is a chemotherapy drug. It targets fast dividing cells, like cancer cells, and causes these cells to die. This medicine is used to treat advanced breast cancer and advanced lung cancer. This medicine may be used for other purposes; ask your health care provider or pharmacist if you have questions. COMMON BRAND NAME(S): Abraxane What should I tell my health care provider before I take this medicine? They need to know if you have any of these conditions: -kidney disease -liver disease -low blood counts, like low platelets, red blood cells, or white blood cells -recent or ongoing radiation therapy -an unusual or allergic reaction to paclitaxel, albumin, other chemotherapy, other medicines, foods, dyes, or preservatives -pregnant or trying to get pregnant -breast-feeding How should I use this medicine? This drug is given as an infusion into a vein. It is administered in a hospital or clinic by a specially trained health care professional. Talk to your pediatrician regarding the use of this medicine in children. Special care may be needed. Overdosage: If you think you have taken too much of this medicine contact a poison control center or emergency room at once. NOTE: This medicine is only for you. Do not share this medicine with others. What if I miss a dose? It is important not to miss your dose. Call your doctor or health care professional if you are unable to keep an appointment. What may interact with this medicine? -cyclosporine -diazepam -ketoconazole -medicines to increase blood counts like filgrastim, pegfilgrastim, sargramostim -other chemotherapy drugs like cisplatin, doxorubicin, epirubicin, etoposide, teniposide, vincristine -quinidine -testosterone -vaccines -verapamil Talk to your doctor or health care professional  before taking any of these medicines: -acetaminophen -aspirin -ibuprofen -ketoprofen -naproxen This list may not describe all possible interactions. Give your health care provider a list of all the medicines, herbs, non-prescription drugs, or dietary supplements you use. Also tell them if you smoke, drink alcohol, or use illegal drugs. Some items may interact with your medicine. What should I watch for while using this medicine? Your condition will be monitored carefully while you are receiving this medicine. You will need important blood work done while you are taking this medicine. This drug may make you feel generally unwell. This is not uncommon, as chemotherapy can affect healthy cells as well as cancer cells. Report any side effects. Continue your course of treatment even though you feel ill unless your doctor tells you to stop. In some cases, you may be given additional medicines to help with side effects. Follow all directions for their use. Call your doctor or health care professional for advice if you get a fever, chills or sore throat, or other symptoms of a cold or flu. Do not treat yourself. This drug decreases your body's ability to fight infections. Try to avoid being around people who are sick. This medicine may increase your risk to bruise or bleed. Call your doctor or health care professional if you notice any unusual bleeding. Be careful brushing and flossing your teeth or using a toothpick because you may get an infection or bleed more easily. If you have any dental work done, tell your dentist you are receiving this medicine. Avoid taking products that contain aspirin, acetaminophen, ibuprofen, naproxen, or ketoprofen unless instructed by your doctor. These medicines may hide a fever. Do not become pregnant while taking this medicine. Women should inform their doctor if they wish  to become pregnant or think they might be pregnant. There is a potential for serious side effects to  an unborn child. Talk to your health care professional or pharmacist for more information. Do not breast-feed an infant while taking this medicine. Men are advised not to father a child while receiving this medicine. What side effects may I notice from receiving this medicine? Side effects that you should report to your doctor or health care professional as soon as possible: -allergic reactions like skin rash, itching or hives, swelling of the face, lips, or tongue -low blood counts - This drug may decrease the number of white blood cells, red blood cells and platelets. You may be at increased risk for infections and bleeding. -signs of infection - fever or chills, cough, sore throat, pain or difficulty passing urine -signs of decreased platelets or bleeding - bruising, pinpoint red spots on the skin, black, tarry stools, nosebleeds -signs of decreased red blood cells - unusually weak or tired, fainting spells, lightheadedness -breathing problems -changes in vision -chest pain -high or low blood pressure -mouth sores Implanted Port Insertion, Care After Refer to this sheet in the next few weeks. These instructions provide you with information on caring for yourself after your procedure. Your health care provider may also give you more specific instructions. Your treatment has been planned according to current medical practices, but problems sometimes occur. Call your health care provider if you have any problems or questions after your procedure. WHAT TO EXPECT AFTER THE PROCEDURE After your procedure, it is typical to have the following:   Discomfort at the port insertion site. Ice packs to the area will help.  Bruising on the skin over the port. This will subside in 3-4 days. HOME CARE INSTRUCTIONS  After your port is placed, you will get a manufacturer's information card. The card has information about your port. Keep this card with you at all times.   Know what kind of port you have.  There are many types of ports available.   Wear a medical alert bracelet in case of an emergency. This can help alert health care workers that you have a port.   The port can stay in for as long as your health care provider believes it is necessary.   A home health care nurse may give medicines and take care of the port.   You or a family member can get special training and directions for giving medicine and taking care of the port at home.  SEEK MEDICAL CARE IF:   Your port does not flush or you are unable to get a blood return.   You have a fever or chills. SEEK IMMEDIATE MEDICAL CARE IF:  You have new fluid or pus coming from your incision.   You notice a bad smell coming from your incision site.   You have swelling, pain, or more redness at the incision or port site.   You have chest pain or shortness of breath. Document Released: 08/09/2013 Document Revised: 10/24/2013 Document Reviewed: 08/09/2013 [facility] Patient Information 2015 [place], [place]. This information is not intended to replace advice given to you by your health care provider. Make sure you discuss any questions you have with your health care provider.

## 2014-07-19 ENCOUNTER — Encounter: Payer: Self-pay | Admitting: Oncology

## 2014-07-19 NOTE — Progress Notes (Signed)
24 Hour Chemotherapy Follow up Call  Jacqueline Hahn called at home following administration of chemotherapy. Patient reports no nausea.  Medications reviewed-Zofran and Decadron.   Following interventions recommended - Take Rxs as ordered.  Reviewed all post chemotherapy instructions with patient and when should call clinic or MD.  Patient verbalized understanding.

## 2014-07-19 NOTE — Progress Notes (Signed)
[place]  Telephone:([phone]) [phone] Fax:(336) [phone]  ID: Jacqueline Hahn OB: 09-29-1946 MR#: [id number] DTO#:[id number] Patient Care Team: Midge Minium, MD as PCP - General (Family Medicine)  DIAGNOSIS: Metastatic adenocarcinoma of the pancreas  INTERVAL HISTORY: Jacqueline Hahn is here today for Day 1 Cycle 1 of treatment. She is feeling good. She is a little nervous about getting started. Her recent scan during hospitalization showed she had metastatic disease to her lungs with pulmonary nodules. She had lymphadenopathy in the abdomen and pelvis. She had a hemangioma in her liver that measured 3.3 x 3.8cm. The distal pancreas was atrophied. She also underwent a biopsy which showed adenocarcinoma. Her appetite has improved and she is drinking plenty of fluids to stay hydrated. She has had no bleeding or pain. She denies fever, chills, n/v, cough, rash, headache, dizziness, SOB, chest pain, palpitations, abdominal pain, constipation, diarrhea, blood in urine or stool. She has had no swelling, tenderness, numbness or tingling in her extremities. Her port-a-cath has healed nicely and looks good. She is still considering moving back to [place] at some point.   CURRENT TREATMENT: Gemcitabine/Abraxane (started 07/18/14)  REVIEW OF SYSTEMS: All other 10 point review of systems is negative.  PAST MEDICAL HISTORY: Past Medical History  Diagnosis Date  . Hypertension   . UTI (urinary tract infection)   . History of stomach ulcers   . Diabetes mellitus without complication   . Complication of anesthesia     hx of n/v  . PONV (postoperative nausea and vomiting)   . Pancreatic cancer metastasized to lung 07/10/2014   PAST SURGICAL HISTORY: Past Surgical History  Procedure Laterality Date  . Dental surgery    . Bunionectomy    . Ercp N/A 06/23/2014    Procedure: ENDOSCOPIC RETROGRADE CHOLANGIOPANCREATOGRAPHY (ERCP);  Surgeon: Ladene Artist, MD;  Location: [facility] ENDOSCOPY;  Service:  Endoscopy;  Laterality: N/A;  . Eus N/A 06/29/2014    Procedure: UPPER ENDOSCOPIC ULTRASOUND (EUS) LINEAR;  Surgeon: Beryle Beams, MD;  Location: [facility] ENDOSCOPY;  Service: Endoscopy;  Laterality: N/A;  . Fine needle aspiration N/A 06/29/2014    Procedure: FINE NEEDLE ASPIRATION (FNA) LINEAR;  Surgeon: Beryle Beams, MD;  Location: [facility] ENDOSCOPY;  Service: Endoscopy;  Laterality: N/A;   FAMILY HISTORY Family History  Problem Relation Age of Onset  . Heart attack Mother   . Stroke Mother   . Heart attack Father   . Hyperlipidemia Sister   . Hypertension Sister   . Hypertension Brother   . Alcohol abuse Brother    GYNECOLOGIC HISTORY:  No LMP recorded. Patient is postmenopausal.   SOCIAL HISTORY:  History   Social History  . Marital Status: Single    Spouse Name: N/A    Number of Children: N/A  . Years of Education: N/A   Occupational History  . Not on file.   Social History Main Topics  . Smoking status: Former Smoker -- 1.00 packs/day for 4 years    Types: Cigarettes    Start date: 02/03/1963    Quit date: 03/05/1967  . Smokeless tobacco: Never Used     Comment: quit 45 years ago  . Alcohol Use: No  . Drug Use: No  . Sexual Activity: Not on file   Other Topics Concern  . Not on file   Social History Narrative  . No narrative on file   ADVANCED DIRECTIVES: <no information>  HEALTH MAINTENANCE: History  Substance Use Topics  . Smoking status: Former Smoker -- 1.00  packs/day for 4 years    Types: Cigarettes    Start date: 02/03/1963    Quit date: 03/05/1967  . Smokeless tobacco: Never Used     Comment: quit 45 years ago  . Alcohol Use: No   Colonoscopy: PAP: Bone density: Lipid panel:  No Known Allergies  Current Outpatient Prescriptions  Medication Sig Dispense Refill  . amLODipine (NORVASC) 10 MG tablet Take 10 mg by mouth daily.      . Calcium Carbonate-Vitamin D (CALCIUM + D PO) Take 1 tablet by mouth daily.      Marland Kitchen dexamethasone (DECADRON) 4 MG  tablet Take 2 tablets (8 mg total) by mouth 2 (two) times daily with a meal. Start the day after chemotherapy for 2 days. Take with food.  30 tablet  1  . Flaxseed, Linseed, (FLAX PO) Take 1 tablet by mouth daily.      . lansoprazole (PREVACID) 15 MG capsule Take 15 mg by mouth as needed.       . lidocaine-prilocaine (EMLA) cream Apply 1 application topically as needed. Apply quarter sized amount to portacath site at least one hour prior to chemo treatment  30 g  0  . lipase/protease/amylase (CREON) 12000 UNITS CPEP capsule Take 12,000 Units by mouth every morning.      Marland Kitchen LORazepam (ATIVAN) 0.5 MG tablet Take 1 tablet (0.5 mg total) by mouth every 6 (six) hours as needed (Nausea or vomiting).  30 tablet  0  . megestrol (MEGACE) 400 MG/10ML suspension Take 20 mLs (800 mg total) by mouth daily.  480 mL  3  . Multiple Vitamin (MULTIVITAMIN WITH MINERALS) TABS tablet Take 1 tablet by mouth daily.      . ondansetron (ZOFRAN) 8 MG tablet Take 1 tablet (8 mg total) by mouth 2 (two) times daily. Start the day after chemo for 2 days. Then take as needed for nausea or vomiting.  30 tablet  1  . pyridOXINE (VITAMIN B-6) 100 MG tablet Take 100 mg by mouth daily.      . vitamin A 8000 UNIT capsule Take 8,000 Units by mouth daily.      . vitamin E 400 UNIT capsule Take 400 Units by mouth daily.       No current facility-administered medications for this visit.   OBJECTIVE: Filed Vitals:   07/18/14 1142  BP: 154/90  Pulse: 90  Temp: 98.2 F (36.8 C)  Resp: 20   Body mass index is 26.17 kg/(m^2). ECOG FS:0 - Asymptomatic Ocular: Sclerae unicteric, pupils equal, round and reactive to light Ear-nose-throat: Oropharynx clear, dentition fair Lymphatic: No cervical or supraclavicular adenopathy Lungs no rales or rhonchi, good excursion bilaterally Heart regular rate and rhythm, no murmur appreciated Abd soft, nontender, positive bowel sounds MSK no focal spinal tenderness, no joint edema Neuro: non-focal,  well-oriented, appropriate affect Breasts: Deferred   LAB RESULTS: CMP     Component Value Date/Time   NA 137 07/18/2014 1041   NA 140 07/05/2014 1511   K 3.2* 07/18/2014 1041   K 3.8 07/05/2014 1511   CL 102 07/18/2014 1041   CL 106 07/05/2014 1511   CO2 25 07/18/2014 1041   CO2 24 07/05/2014 1511   GLUCOSE 142* 07/18/2014 1041   GLUCOSE 119* 07/05/2014 1511   BUN 12 07/18/2014 1041   BUN 15 07/05/2014 1511   CREATININE 0.7 07/18/2014 1041   CREATININE 0.67 07/05/2014 1511   CALCIUM 9.7 07/18/2014 1041   CALCIUM 10.0 07/05/2014 1511   PROT 7.3 07/18/2014  1041   PROT 7.3 07/05/2014 1511   ALBUMIN 4.2 07/05/2014 1511   AST 27 07/18/2014 1041   AST 22 07/05/2014 1511   ALT 36 07/18/2014 1041   ALT 51* 07/05/2014 1511   ALKPHOS 132* 07/18/2014 1041   ALKPHOS 208* 07/05/2014 1511   BILITOT 0.50 07/18/2014 1041   BILITOT 0.5 07/05/2014 1511   GFRNONAA >90 06/25/2014 0915   GFRAA >90 06/25/2014 0915   No results found for this basename: SPEP, UPEP,  kappa and lambda light chains   Lab Results  Component Value Date   WBC 10.4* 07/18/2014   NEUTROABS 5.1 07/18/2014   HGB 13.2 07/18/2014   HCT 40.1 07/18/2014   MCV 73* 07/18/2014   PLT 297 07/18/2014   No results found for this basename: LABCA2   No components found with this basename: [id number]    Recent Labs Lab 07/16/14 1240  INR 0.93   STUDIES: None  ASSESSMENT/PLAN: Jacqueline Hahn is 68 year old female. She is metastatic adenocarcinoma of pancreas. Her CA 19-9 level 2 weeks ago was 13,474. She seems to be doing ok at this time. She is asymptomatic at this time.  She will proceed with day 1 cycle 1 of gemcitabine/abraxane today.  Her port-a-cath site is healing nicely. No s/s of infection. Her appetite has improved and she is staying hydrated.  Her labs today look good. We will continue to keep a watch on her potassium.  We will give her a new treatment schedule.  We will see her back in October for labs and follow-up.  She knows to call here with any  questions or concerns and to go to the ED in the event of an emergency. We can certainly see her sooner if need be.   Eliezer Bottom, NP 07/19/2014 8:37 AM

## 2014-07-24 ENCOUNTER — Telehealth: Payer: Self-pay | Admitting: Hematology & Oncology

## 2014-07-24 NOTE — Telephone Encounter (Signed)
[employer] approved MEGESTROL ACET 40 MG/ML SUSP, 480/48     Dates: 07/24/2014- 11/01/2014      P: [phone]

## 2014-07-24 NOTE — Telephone Encounter (Signed)
[employer] [facility] approved MEGESTROL ACET 40 MG/ML SUSP, 480/48    Ref: [id number]  Dates: 07/24/2014- 11/01/2014    P: [phone]

## 2014-07-26 ENCOUNTER — Encounter: Payer: Self-pay | Admitting: Hematology & Oncology

## 2014-07-31 ENCOUNTER — Other Ambulatory Visit: Payer: Self-pay | Admitting: Hematology & Oncology

## 2014-07-31 ENCOUNTER — Encounter: Payer: Medicare Other | Admitting: Family Medicine

## 2014-07-31 DIAGNOSIS — C259 Malignant neoplasm of pancreas, unspecified: Secondary | ICD-10-CM

## 2014-07-31 DIAGNOSIS — C7800 Secondary malignant neoplasm of unspecified lung: Principal | ICD-10-CM

## 2014-08-01 ENCOUNTER — Other Ambulatory Visit (HOSPITAL_BASED_OUTPATIENT_CLINIC_OR_DEPARTMENT_OTHER): Payer: Medicare Other | Admitting: Lab

## 2014-08-01 ENCOUNTER — Ambulatory Visit (HOSPITAL_BASED_OUTPATIENT_CLINIC_OR_DEPARTMENT_OTHER): Payer: Medicare Other

## 2014-08-01 VITALS — BP 179/94 | HR 89 | Temp 98.8°F | Resp 20

## 2014-08-01 DIAGNOSIS — C7800 Secondary malignant neoplasm of unspecified lung: Secondary | ICD-10-CM

## 2014-08-01 DIAGNOSIS — C259 Malignant neoplasm of pancreas, unspecified: Secondary | ICD-10-CM

## 2014-08-01 DIAGNOSIS — Z5111 Encounter for antineoplastic chemotherapy: Secondary | ICD-10-CM

## 2014-08-01 DIAGNOSIS — K8689 Other specified diseases of pancreas: Secondary | ICD-10-CM

## 2014-08-01 LAB — CBC WITH DIFFERENTIAL (CANCER CENTER ONLY)
BASO#: 0 10*3/uL (ref 0.0–0.2)
BASO%: 0.4 % (ref 0.0–2.0)
EOS ABS: 0.1 10*3/uL (ref 0.0–0.5)
EOS%: 1.4 % (ref 0.0–7.0)
HCT: 40.8 % (ref 34.8–46.6)
HEMOGLOBIN: 13.5 g/dL (ref 11.6–15.9)
LYMPH#: 3.8 10*3/uL — AB (ref 0.9–3.3)
LYMPH%: 38.5 % (ref 14.0–48.0)
MCH: 24 pg — ABNORMAL LOW (ref 26.0–34.0)
MCHC: 33.1 g/dL (ref 32.0–36.0)
MCV: 73 fL — ABNORMAL LOW (ref 81–101)
MONO#: 1 10*3/uL — ABNORMAL HIGH (ref 0.1–0.9)
MONO%: 10.5 % (ref 0.0–13.0)
NEUT#: 4.8 10*3/uL (ref 1.5–6.5)
NEUT%: 49.2 % (ref 39.6–80.0)
Platelets: 355 10*3/uL (ref 145–400)
RBC: 5.62 10*6/uL — AB (ref 3.70–5.32)
RDW: 15.5 % (ref 11.1–15.7)
WBC: 9.7 10*3/uL (ref 3.9–10.0)

## 2014-08-01 LAB — COMPREHENSIVE METABOLIC PANEL
ALT: 38 U/L — AB (ref 0–35)
AST: 31 U/L (ref 0–37)
Albumin: 4.1 g/dL (ref 3.5–5.2)
Alkaline Phosphatase: 145 U/L — ABNORMAL HIGH (ref 39–117)
BILIRUBIN TOTAL: 0.3 mg/dL (ref 0.2–1.2)
BUN: 13 mg/dL (ref 6–23)
CALCIUM: 9.8 mg/dL (ref 8.4–10.5)
CHLORIDE: 103 meq/L (ref 96–112)
CO2: 25 meq/L (ref 19–32)
CREATININE: 0.79 mg/dL (ref 0.50–1.10)
Glucose, Bld: 156 mg/dL — ABNORMAL HIGH (ref 70–99)
Potassium: 3.9 mEq/L (ref 3.5–5.3)
Sodium: 139 mEq/L (ref 135–145)
Total Protein: 7.1 g/dL (ref 6.0–8.3)

## 2014-08-01 LAB — CANCER ANTIGEN 19-9: CA 19-9: 14523.8 U/mL — ABNORMAL HIGH

## 2014-08-01 LAB — CHCC SATELLITE - SMEAR

## 2014-08-01 MED ORDER — DEXAMETHASONE SODIUM PHOSPHATE 10 MG/ML IJ SOLN
10.0000 mg | Freq: Once | INTRAMUSCULAR | Status: DC
  Administered 2014-08-01: 10 mg via INTRAVENOUS

## 2014-08-01 MED ORDER — HEPARIN SOD (PORK) LOCK FLUSH 100 UNIT/ML IV SOLN
500.0000 [IU] | Freq: Once | INTRAVENOUS | Status: AC | PRN
  Administered 2014-08-01: 500 [IU]
  Filled 2014-08-01: qty 5

## 2014-08-01 MED ORDER — SODIUM CHLORIDE 0.9 % IV SOLN
1600.0000 mg | Freq: Once | INTRAVENOUS | Status: AC
  Administered 2014-08-01: 1600 mg via INTRAVENOUS
  Filled 2014-08-01: qty 42.08

## 2014-08-01 MED ORDER — PACLITAXEL PROTEIN-BOUND CHEMO INJECTION 100 MG
125.0000 mg/m2 | Freq: Once | INTRAVENOUS | Status: AC
  Administered 2014-08-01: 200 mg via INTRAVENOUS
  Filled 2014-08-01: qty 40

## 2014-08-01 MED ORDER — SODIUM CHLORIDE 0.9 % IV SOLN: 1000.0000 mg/m2 | Freq: Once | INTRAVENOUS | Status: DC

## 2014-08-01 MED ORDER — DEXAMETHASONE SODIUM PHOSPHATE 20 MG/5ML IJ SOLN
INTRAMUSCULAR | Status: AC
  Filled 2014-08-01: qty 5

## 2014-08-01 MED ORDER — ONDANSETRON 8 MG/50ML IVPB (CHCC)
8.0000 mg | Freq: Once | INTRAVENOUS | Status: AC
  Administered 2014-08-01: 8 mg via INTRAVENOUS

## 2014-08-01 MED ORDER — ONDANSETRON 8 MG/50ML IVPB (CHCC)
8.0000 mg | Freq: Once | INTRAVENOUS | Status: DC
  Administered 2014-08-01: 8 mg via INTRAVENOUS

## 2014-08-01 MED ORDER — CYPROHEPTADINE HCL 4 MG PO TABS: 4.0000 mg | ORAL_TABLET | Freq: Two times a day (BID) | ORAL | Status: DC

## 2014-08-01 MED ORDER — SODIUM CHLORIDE 0.9 % IV SOLN
Freq: Once | INTRAVENOUS | Status: DC
  Administered 2014-08-01: 12:00:00 via INTRAVENOUS

## 2014-08-01 MED ORDER — DEXAMETHASONE SODIUM PHOSPHATE 10 MG/ML IJ SOLN
10.0000 mg | Freq: Once | INTRAMUSCULAR | Status: AC
  Administered 2014-08-01: 10 mg via INTRAVENOUS

## 2014-08-01 MED ORDER — SODIUM CHLORIDE 0.9 % IJ SOLN
10.0000 mL | INTRAMUSCULAR | Status: DC | PRN
  Filled 2014-08-01: qty 10

## 2014-08-01 MED ORDER — HEPARIN SOD (PORK) LOCK FLUSH 100 UNIT/ML IV SOLN
500.0000 [IU] | Freq: Once | INTRAVENOUS | Status: DC | PRN
Start: 2014-08-01 — End: 2014-08-01
  Filled 2014-08-01: qty 5

## 2014-08-01 MED ORDER — PACLITAXEL PROTEIN-BOUND CHEMO INJECTION 100 MG: 125.0000 mg/m2 | Freq: Once | INTRAVENOUS | Status: DC

## 2014-08-01 MED ORDER — SODIUM CHLORIDE 0.9 % IJ SOLN
10.0000 mL | INTRAMUSCULAR | Status: DC | PRN
  Administered 2014-08-01: 10 mL
  Filled 2014-08-01: qty 10

## 2014-08-01 NOTE — Patient Instructions (Signed)
Gemcitabine injection What is this medicine? GEMCITABINE (jem SIT a been) is a chemotherapy drug. This medicine is used to treat many types of cancer like breast cancer, lung cancer, pancreatic cancer, and ovarian cancer. This medicine may be used for other purposes; ask your health care provider or pharmacist if you have questions. COMMON BRAND NAME(S): Gemzar What should I tell my health care provider before I take this medicine? They need to know if you have any of these conditions: -blood disorders -infection -kidney disease -liver disease -recent or ongoing radiation therapy -an unusual or allergic reaction to gemcitabine, other chemotherapy, other medicines, foods, dyes, or preservatives -pregnant or trying to get pregnant -breast-feeding How should I use this medicine? This drug is given as an infusion into a vein. It is administered in a hospital or clinic by a specially trained health care professional. Talk to your pediatrician regarding the use of this medicine in children. Special care may be needed. Overdosage: If you think you have taken too much of this medicine contact a poison control center or emergency room at once. NOTE: This medicine is only for you. Do not share this medicine with others. What if I miss a dose? It is important not to miss your dose. Call your doctor or health care professional if you are unable to keep an appointment. What may interact with this medicine? -medicines to increase blood counts like filgrastim, pegfilgrastim, sargramostim -some other chemotherapy drugs like cisplatin -vaccines Talk to your doctor or health care professional before taking any of these medicines: -acetaminophen -aspirin -ibuprofen -ketoprofen -naproxen This list may not describe all possible interactions. Give your health care provider a list of all the medicines, herbs, non-prescription drugs, or dietary supplements you use. Also tell them if you smoke, drink alcohol,  or use illegal drugs. Some items may interact with your medicine. What should I watch for while using this medicine? Visit your doctor for checks on your progress. This drug may make you feel generally unwell. This is not uncommon, as chemotherapy can affect healthy cells as well as cancer cells. Report any side effects. Continue your course of treatment even though you feel ill unless your doctor tells you to stop. In some cases, you may be given additional medicines to help with side effects. Follow all directions for their use. Call your doctor or health care professional for advice if you get a fever, chills or sore throat, or other symptoms of a cold or flu. Do not treat yourself. This drug decreases your body's ability to fight infections. Try to avoid being around people who are sick. This medicine may increase your risk to bruise or bleed. Call your doctor or health care professional if you notice any unusual bleeding. Be careful brushing and flossing your teeth or using a toothpick because you may get an infection or bleed more easily. If you have any dental work done, tell your dentist you are receiving this medicine. Avoid taking products that contain aspirin, acetaminophen, ibuprofen, naproxen, or ketoprofen unless instructed by your doctor. These medicines may hide a fever. Women should inform their doctor if they wish to become pregnant or think they might be pregnant. There is a potential for serious side effects to an unborn child. Talk to your health care professional or pharmacist for more information. Do not breast-feed an infant while taking this medicine. What side effects may I notice from receiving this medicine? Side effects that you should report to your doctor or health care professional as   soon as possible: -allergic reactions like skin rash, itching or hives, swelling of the face, lips, or tongue -low blood counts - this medicine may decrease the number of white blood cells,  red blood cells and platelets. You may be at increased risk for infections and bleeding. -signs of infection - fever or chills, cough, sore throat, pain or difficulty passing urine -signs of decreased platelets or bleeding - bruising, pinpoint red spots on the skin, black, tarry stools, blood in the urine -signs of decreased red blood cells - unusually weak or tired, fainting spells, lightheadedness -breathing problems -chest pain -mouth sores -nausea and vomiting -pain, swelling, redness at site where injected -pain, tingling, numbness in the hands or feet -stomach pain -swelling of ankles, feet, hands -unusual bleeding Side effects that usually do not require medical attention (report to your doctor or health care professional if they continue or are bothersome): -constipation -diarrhea -hair loss -loss of appetite -stomach upset This list may not describe all possible side effects. Call your doctor for medical advice about side effects. You may report side effects to FDA at 1-800-FDA-1088. Where should I keep my medicine? This drug is given in a hospital or clinic and will not be stored at home. NOTE: This sheet is a summary. It may not cover all possible information. If you have questions about this medicine, talk to your doctor, pharmacist, or health care provider.  2015, Elsevier/Gold Standard. (2008-02-28 18:45:54)   Nanoparticle Albumin-Bound Paclitaxel injection What is this medicine? NANOPARTICLE ALBUMIN-BOUND PACLITAXEL (Na no PAHR ti kuhl al BYOO muhn-bound PAK li TAX el) is a chemotherapy drug. It targets fast dividing cells, like cancer cells, and causes these cells to die. This medicine is used to treat advanced breast cancer and advanced lung cancer. This medicine may be used for other purposes; ask your health care provider or pharmacist if you have questions. COMMON BRAND NAME(S): Abraxane What should I tell my health care provider before I take this medicine? They  need to know if you have any of these conditions: -kidney disease -liver disease -low blood counts, like low platelets, red blood cells, or white blood cells -recent or ongoing radiation therapy -an unusual or allergic reaction to paclitaxel, albumin, other chemotherapy, other medicines, foods, dyes, or preservatives -pregnant or trying to get pregnant -breast-feeding How should I use this medicine? This drug is given as an infusion into a vein. It is administered in a hospital or clinic by a specially trained health care professional. Talk to your pediatrician regarding the use of this medicine in children. Special care may be needed. Overdosage: If you think you have taken too much of this medicine contact a poison control center or emergency room at once. NOTE: This medicine is only for you. Do not share this medicine with others. What if I miss a dose? It is important not to miss your dose. Call your doctor or health care professional if you are unable to keep an appointment. What may interact with this medicine? -cyclosporine -diazepam -ketoconazole -medicines to increase blood counts like filgrastim, pegfilgrastim, sargramostim -other chemotherapy drugs like cisplatin, doxorubicin, epirubicin, etoposide, teniposide, vincristine -quinidine -testosterone -vaccines -verapamil Talk to your doctor or health care professional before taking any of these medicines: -acetaminophen -aspirin -ibuprofen -ketoprofen -naproxen This list may not describe all possible interactions. Give your health care provider a list of all the medicines, herbs, non-prescription drugs, or dietary supplements you use. Also tell them if you smoke, drink alcohol, or use illegal drugs. Some   items may interact with your medicine. What should I watch for while using this medicine? Your condition will be monitored carefully while you are receiving this medicine. You will need important blood work done while you are  taking this medicine. This drug may make you feel generally unwell. This is not uncommon, as chemotherapy can affect healthy cells as well as cancer cells. Report any side effects. Continue your course of treatment even though you feel ill unless your doctor tells you to stop. In some cases, you may be given additional medicines to help with side effects. Follow all directions for their use. Call your doctor or health care professional for advice if you get a fever, chills or sore throat, or other symptoms of a cold or flu. Do not treat yourself. This drug decreases your body's ability to fight infections. Try to avoid being around people who are sick. This medicine may increase your risk to bruise or bleed. Call your doctor or health care professional if you notice any unusual bleeding. Be careful brushing and flossing your teeth or using a toothpick because you may get an infection or bleed more easily. If you have any dental work done, tell your dentist you are receiving this medicine. Avoid taking products that contain aspirin, acetaminophen, ibuprofen, naproxen, or ketoprofen unless instructed by your doctor. These medicines may hide a fever. Do not become pregnant while taking this medicine. Women should inform their doctor if they wish to become pregnant or think they might be pregnant. There is a potential for serious side effects to an unborn child. Talk to your health care professional or pharmacist for more information. Do not breast-feed an infant while taking this medicine. Men are advised not to father a child while receiving this medicine. What side effects may I notice from receiving this medicine? Side effects that you should report to your doctor or health care professional as soon as possible: -allergic reactions like skin rash, itching or hives, swelling of the face, lips, or tongue -low blood counts - This drug may decrease the number of white blood cells, red blood cells and  platelets. You may be at increased risk for infections and bleeding. -signs of infection - fever or chills, cough, sore throat, pain or difficulty passing urine -signs of decreased platelets or bleeding - bruising, pinpoint red spots on the skin, black, tarry stools, nosebleeds -signs of decreased red blood cells - unusually weak or tired, fainting spells, lightheadedness -breathing problems -changes in vision -chest pain -high or low blood pressure -mouth sores Implanted Port Insertion, Care After Refer to this sheet in the next few weeks. These instructions provide you with information on caring for yourself after your procedure. Your health care provider may also give you more specific instructions. Your treatment has been planned according to current medical practices, but problems sometimes occur. Call your health care provider if you have any problems or questions after your procedure. WHAT TO EXPECT AFTER THE PROCEDURE After your procedure, it is typical to have the following:   Discomfort at the port insertion site. Ice packs to the area will help.  Bruising on the skin over the port. This will subside in 3-4 days. HOME CARE INSTRUCTIONS  After your port is placed, you will get a manufacturer's information card. The card has information about your port. Keep this card with you at all times.   Know what kind of port you have. There are many types of ports available.   Wear a medical   alert bracelet in case of an emergency. This can help alert health care workers that you have a port.   The port can stay in for as long as your health care provider believes it is necessary.   A home health care nurse may give medicines and take care of the port.   You or a family member can get special training and directions for giving medicine and taking care of the port at home.  SEEK MEDICAL CARE IF:   Your port does not flush or you are unable to get a blood return.   You have a  fever or chills. SEEK IMMEDIATE MEDICAL CARE IF:  You have new fluid or pus coming from your incision.   You notice a bad smell coming from your incision site.   You have swelling, pain, or more redness at the incision or port site.   You have chest pain or shortness of breath. Document Released: 08/09/2013 Document Revised: 10/24/2013 Document Reviewed: 08/09/2013 ExitCare Patient Information 2015 [employer]. This information is not intended to replace advice given to you by your health care provider. Make sure you discuss any questions you have with your health care provider.  

## 2014-08-13 ENCOUNTER — Telehealth: Payer: Self-pay | Admitting: Hematology & Oncology

## 2014-08-13 NOTE — Telephone Encounter (Signed)
na

## 2014-08-15 ENCOUNTER — Ambulatory Visit (HOSPITAL_BASED_OUTPATIENT_CLINIC_OR_DEPARTMENT_OTHER): Payer: Medicare Other

## 2014-08-15 ENCOUNTER — Encounter: Payer: Self-pay | Admitting: Family

## 2014-08-15 ENCOUNTER — Other Ambulatory Visit (HOSPITAL_BASED_OUTPATIENT_CLINIC_OR_DEPARTMENT_OTHER): Payer: Medicare Other | Admitting: Lab

## 2014-08-15 ENCOUNTER — Ambulatory Visit (HOSPITAL_BASED_OUTPATIENT_CLINIC_OR_DEPARTMENT_OTHER): Payer: Medicare Other | Admitting: Family

## 2014-08-15 VITALS — BP 173/93 | HR 100 | Temp 98.3°F | Resp 20 | Ht 60.0 in | Wt 133.0 lb

## 2014-08-15 DIAGNOSIS — C7800 Secondary malignant neoplasm of unspecified lung: Secondary | ICD-10-CM

## 2014-08-15 DIAGNOSIS — C259 Malignant neoplasm of pancreas, unspecified: Secondary | ICD-10-CM

## 2014-08-15 DIAGNOSIS — C250 Malignant neoplasm of head of pancreas: Secondary | ICD-10-CM

## 2014-08-15 DIAGNOSIS — K8689 Other specified diseases of pancreas: Secondary | ICD-10-CM

## 2014-08-15 DIAGNOSIS — Z5111 Encounter for antineoplastic chemotherapy: Secondary | ICD-10-CM

## 2014-08-15 LAB — CBC WITH DIFFERENTIAL (CANCER CENTER ONLY)
BASO#: 0 10*3/uL (ref 0.0–0.2)
BASO%: 0.2 % (ref 0.0–2.0)
EOS%: 0.9 % (ref 0.0–7.0)
Eosinophils Absolute: 0.1 10*3/uL (ref 0.0–0.5)
HCT: 38.6 % (ref 34.8–46.6)
HGB: 13 g/dL (ref 11.6–15.9)
LYMPH#: 5.8 10*3/uL — ABNORMAL HIGH (ref 0.9–3.3)
LYMPH%: 38.3 % (ref 14.0–48.0)
MCH: 24.1 pg — AB (ref 26.0–34.0)
MCHC: 33.7 g/dL (ref 32.0–36.0)
MCV: 72 fL — AB (ref 81–101)
MONO#: 1.9 10*3/uL — AB (ref 0.1–0.9)
MONO%: 12.7 % (ref 0.0–13.0)
NEUT#: 7.2 10*3/uL — ABNORMAL HIGH (ref 1.5–6.5)
NEUT%: 47.9 % (ref 39.6–80.0)
Platelets: 328 10*3/uL (ref 145–400)
RBC: 5.39 10*6/uL — AB (ref 3.70–5.32)
RDW: 16.6 % — AB (ref 11.1–15.7)
WBC: 15 10*3/uL — AB (ref 3.9–10.0)

## 2014-08-15 LAB — CMP (CANCER CENTER ONLY)
ALBUMIN: 3.9 g/dL (ref 3.3–5.5)
ALT: 45 U/L (ref 10–47)
AST: 31 U/L (ref 11–38)
Alkaline Phosphatase: 127 U/L — ABNORMAL HIGH (ref 26–84)
BILIRUBIN TOTAL: 0.4 mg/dL (ref 0.20–1.60)
BUN, Bld: 12 mg/dL (ref 7–22)
CO2: 22 mEq/L (ref 18–33)
Calcium: 10.2 mg/dL (ref 8.0–10.3)
Chloride: 103 mEq/L (ref 98–108)
Creat: 0.4 mg/dl — ABNORMAL LOW (ref 0.6–1.2)
Glucose, Bld: 213 mg/dL — ABNORMAL HIGH (ref 73–118)
POTASSIUM: 3.2 meq/L — AB (ref 3.3–4.7)
Sodium: 141 mEq/L (ref 128–145)
TOTAL PROTEIN: 8 g/dL (ref 6.4–8.1)

## 2014-08-15 MED ORDER — DEXAMETHASONE SODIUM PHOSPHATE 10 MG/ML IJ SOLN
INTRAMUSCULAR | Status: AC
  Filled 2014-08-15: qty 1

## 2014-08-15 MED ORDER — HEPARIN SOD (PORK) LOCK FLUSH 100 UNIT/ML IV SOLN
500.0000 [IU] | Freq: Once | INTRAVENOUS | Status: AC | PRN
  Administered 2014-08-15: 500 [IU]
  Filled 2014-08-15: qty 5

## 2014-08-15 MED ORDER — ONDANSETRON 8 MG/50ML IVPB (CHCC)
8.0000 mg | Freq: Once | INTRAVENOUS | Status: AC
  Administered 2014-08-15: 8 mg via INTRAVENOUS

## 2014-08-15 MED ORDER — DEXAMETHASONE SODIUM PHOSPHATE 10 MG/ML IJ SOLN
10.0000 mg | Freq: Once | INTRAMUSCULAR | Status: AC
  Administered 2014-08-15: 10 mg via INTRAVENOUS

## 2014-08-15 MED ORDER — SODIUM CHLORIDE 0.9 % IV SOLN
1600.0000 mg | Freq: Once | INTRAVENOUS | Status: AC
  Administered 2014-08-15: 1600 mg via INTRAVENOUS
  Filled 2014-08-15: qty 42.08

## 2014-08-15 MED ORDER — SODIUM CHLORIDE 0.9 % IV SOLN
Freq: Once | INTRAVENOUS | Status: AC
  Administered 2014-08-15: 10:00:00 via INTRAVENOUS

## 2014-08-15 MED ORDER — PACLITAXEL PROTEIN-BOUND CHEMO INJECTION 100 MG
125.0000 mg/m2 | Freq: Once | INTRAVENOUS | Status: AC
  Administered 2014-08-15: 200 mg via INTRAVENOUS
  Filled 2014-08-15: qty 40

## 2014-08-15 MED ORDER — SODIUM CHLORIDE 0.9 % IJ SOLN
10.0000 mL | INTRAMUSCULAR | Status: DC | PRN
  Administered 2014-08-15: 10 mL
  Filled 2014-08-15: qty 10

## 2014-08-15 NOTE — Progress Notes (Signed)
[place]  [phone] [phone]) [phone]  ID: Jacqueline Hahn OB: 17-Dec-1945 MR#: [id number] AYT#:[id number] Patient Care Team: Midge Minium, MD as PCP - General (Family Medicine)  DIAGNOSIS: Metastatic adenocarcinoma of the pancreas  INTERVAL HISTORY: Jacqueline Hahn is here today for Day 1 Cycle 2 of treatment. She is doing well. Her recent scan during hospitalization showed she had metastatic disease to her lungs with pulmonary nodules. She had lymphadenopathy in the abdomen and pelvis. She had a hemangioma in her liver that measured 3.3 x 3.8cm. The distal pancreas was atrophied. She also underwent a biopsy which showed adenocarcinoma. Her appetite has improved. She said she is "eating everything in sight". She is drinking plenty of fluids to stay hydrated. She has had no bleeding or pain. She denies fever, chills, n/v, cough, rash, headache, dizziness, SOB, chest pain, palpitations, abdominal pain, constipation, diarrhea, blood in urine or stool. She has had no issues with infections. She has had no swelling, tenderness, numbness or tingling in her extremities. Her port-a-cath has healed nicely and looks good.  CURRENT TREATMENT: Gemcitabine/Abraxane (started 07/18/14)  REVIEW OF SYSTEMS: All other 10 point review of systems is negative.  PAST MEDICAL HISTORY: Past Medical History  Diagnosis Date  . Hypertension   . UTI (urinary tract infection)   . History of stomach ulcers   . Diabetes mellitus without complication   . Complication of anesthesia     hx of n/v  . PONV (postoperative nausea and vomiting)   . Pancreatic cancer metastasized to lung 07/10/2014   PAST SURGICAL HISTORY: Past Surgical History  Procedure Laterality Date  . Dental surgery    . Bunionectomy    . Ercp N/A 06/23/2014    Procedure: ENDOSCOPIC RETROGRADE CHOLANGIOPANCREATOGRAPHY (ERCP);  Surgeon: Ladene Artist, MD;  Location: [facility] ENDOSCOPY;  Service: Endoscopy;  Laterality: N/A;  . Eus N/A  06/29/2014    Procedure: UPPER ENDOSCOPIC ULTRASOUND (EUS) LINEAR;  Surgeon: Beryle Beams, MD;  Location: [facility] ENDOSCOPY;  Service: Endoscopy;  Laterality: N/A;  . Fine needle aspiration N/A 06/29/2014    Procedure: FINE NEEDLE ASPIRATION (FNA) LINEAR;  Surgeon: Beryle Beams, MD;  Location: [facility] ENDOSCOPY;  Service: Endoscopy;  Laterality: N/A;   FAMILY HISTORY Family History  Problem Relation Age of Onset  . Heart attack Mother   . Stroke Mother   . Heart attack Father   . Hyperlipidemia Sister   . Hypertension Sister   . Hypertension Brother   . Alcohol abuse Brother    GYNECOLOGIC HISTORY:  No LMP recorded. Patient is postmenopausal.   SOCIAL HISTORY:  History   Social History  . Marital Status: Single    Spouse Name: N/A    Number of Children: N/A  . Years of Education: N/A   Occupational History  . Not on file.   Social History Main Topics  . Smoking status: Former Smoker -- 1.00 packs/day for 4 years    Types: Cigarettes    Start date: 02/03/1963    Quit date: 03/05/1967  . Smokeless tobacco: Never Used     Comment: quit 45 years ago  . Alcohol Use: No  . Drug Use: No  . Sexual Activity: Not on file   Other Topics Concern  . Not on file   Social History Narrative  . No narrative on file   ADVANCED DIRECTIVES: <no information>  HEALTH MAINTENANCE: History  Substance Use Topics  . Smoking status: Former Smoker -- 1.00 packs/day for 4 years  Types: Cigarettes    Start date: 02/03/1963    Quit date: 03/05/1967  . Smokeless tobacco: Never Used     Comment: quit 45 years ago  . Alcohol Use: No   Colonoscopy: PAP: Bone density: Lipid panel:  No Known Allergies  Current Outpatient Prescriptions  Medication Sig Dispense Refill  . amLODipine (NORVASC) 10 MG tablet Take 10 mg by mouth daily.      . Calcium Carbonate-Vitamin D (CALCIUM + D PO) Take 1 tablet by mouth daily.      . cyproheptadine (PERIACTIN) 4 MG tablet Take 1 tablet (4 mg total) by  mouth 2 (two) times daily.  60 tablet  3  . Flaxseed, Linseed, (FLAX PO) Take 1 tablet by mouth daily.      . lansoprazole (PREVACID) 15 MG capsule Take 15 mg by mouth as needed.       . lidocaine-prilocaine (EMLA) cream Apply 1 application topically as needed. Apply quarter sized amount to portacath site at least one hour prior to chemo treatment  30 g  0  . lipase/protease/amylase (CREON) 12000 UNITS CPEP capsule Take 12,000 Units by mouth every morning.      . megestrol (MEGACE) 400 MG/10ML suspension Take 800 mg by mouth daily.      . Multiple Vitamin (MULTIVITAMIN WITH MINERALS) TABS tablet Take 1 tablet by mouth daily.      Marland Kitchen pyridOXINE (VITAMIN B-6) 100 MG tablet Take 100 mg by mouth daily.      . vitamin A 8000 UNIT capsule Take 8,000 Units by mouth daily.      . vitamin E 400 UNIT capsule Take 400 Units by mouth daily.       No current facility-administered medications for this visit.   OBJECTIVE: Filed Vitals:   08/15/14 0902  BP: 173/93  Pulse: 115  Temp: 98.3 F (36.8 C)  Resp: 20   Body mass index is 25.97 kg/(m^2). ECOG FS:0 - Asymptomatic Ocular: Sclerae unicteric, pupils equal, round and reactive to light Ear-nose-throat: Oropharynx clear, dentition fair Lymphatic: No cervical or supraclavicular adenopathy Lungs no rales or rhonchi, good excursion bilaterally Heart regular rate and rhythm, no murmur appreciated Abd soft, nontender, positive bowel sounds MSK no focal spinal tenderness, no joint edema Neuro: non-focal, well-oriented, appropriate affect Breasts: Deferred   LAB RESULTS: CMP     Component Value Date/Time   NA 141 08/15/2014 0818   NA 139 08/01/2014 0957   K 3.2* 08/15/2014 0818   K 3.9 08/01/2014 0957   CL 103 08/15/2014 0818   CL 103 08/01/2014 0957   CO2 22 08/15/2014 0818   CO2 25 08/01/2014 0957   GLUCOSE 213* 08/15/2014 0818   GLUCOSE 156* 08/01/2014 0957   BUN 12 08/15/2014 0818   BUN 13 08/01/2014 0957   CREATININE 0.4* 08/15/2014 0818    CREATININE 0.79 08/01/2014 0957   CALCIUM 10.2 08/15/2014 0818   CALCIUM 9.8 08/01/2014 0957   PROT 8.0 08/15/2014 0818   PROT 7.1 08/01/2014 0957   ALBUMIN 4.1 08/01/2014 0957   AST 31 08/15/2014 0818   AST 31 08/01/2014 0957   ALT 45 08/15/2014 0818   ALT 38* 08/01/2014 0957   ALKPHOS 127* 08/15/2014 0818   ALKPHOS 145* 08/01/2014 0957   BILITOT 0.40 08/15/2014 0818   BILITOT 0.3 08/01/2014 0957   GFRNONAA >90 06/25/2014 0915   GFRAA >90 06/25/2014 0915   No results found for this basename: SPEP,  UPEP,   kappa and lambda light chains   Lab  Results  Component Value Date   WBC 15.0* 08/15/2014   NEUTROABS 7.2* 08/15/2014   HGB 13.0 08/15/2014   HCT 38.6 08/15/2014   MCV 72* 08/15/2014   PLT 328 08/15/2014   No results found for this basename: LABCA2   No components found with this basename: [id number]   No results found for this basename: INR,  in the last 168 hours STUDIES: None  ASSESSMENT/PLAN: Jacqueline Hahn is 68 year old female. She is metastatic adenocarcinoma of pancreas. Her CA 19-9 level 2 weeks ago was 14,523. She seems to be doing ok and is asymptomatic at this time.  She will proceed with day 1 cycle 2 of gemcitabine/abraxane today.  Her port-a-cath site is healing nicely. No s/s of infection. Her appetite continues to improve and she is staying hydrated.  Her WBC is 15.0 but she has had no issue with infections. We will also continue to keep a watch on her potassium.  She has her treatment schedule.  We will repeat CT scans on November 9th.   She knows to call here with any questions or concerns and to go to the ED in the event of an emergency. We can certainly see her sooner if need be.   Eliezer Bottom, NP 08/15/2014 9:36 AM

## 2014-08-15 NOTE — Patient Instructions (Signed)
Gemcitabine injection What is this medicine? GEMCITABINE (jem SIT a been) is a chemotherapy drug. This medicine is used to treat many types of cancer like breast cancer, lung cancer, pancreatic cancer, and ovarian cancer. This medicine may be used for other purposes; ask your health care provider or pharmacist if you have questions. COMMON BRAND NAME(S): Gemzar What should I tell my health care provider before I take this medicine? They need to know if you have any of these conditions: -blood disorders -infection -kidney disease -liver disease -recent or ongoing radiation therapy -an unusual or allergic reaction to gemcitabine, other chemotherapy, other medicines, foods, dyes, or preservatives -pregnant or trying to get pregnant -breast-feeding How should I use this medicine? This drug is given as an infusion into a vein. It is administered in a hospital or clinic by a specially trained health care professional. Talk to your pediatrician regarding the use of this medicine in children. Special care may be needed. Overdosage: If you think you have taken too much of this medicine contact a poison control center or emergency room at once. NOTE: This medicine is only for you. Do not share this medicine with others. What if I miss a dose? It is important not to miss your dose. Call your doctor or health care professional if you are unable to keep an appointment. What may interact with this medicine? -medicines to increase blood counts like filgrastim, pegfilgrastim, sargramostim -some other chemotherapy drugs like cisplatin -vaccines Talk to your doctor or health care professional before taking any of these medicines: -acetaminophen -aspirin -ibuprofen -ketoprofen -naproxen This list may not describe all possible interactions. Give your health care provider a list of all the medicines, herbs, non-prescription drugs, or dietary supplements you use. Also tell them if you smoke, drink alcohol,  or use illegal drugs. Some items may interact with your medicine. What should I watch for while using this medicine? Visit your doctor for checks on your progress. This drug may make you feel generally unwell. This is not uncommon, as chemotherapy can affect healthy cells as well as cancer cells. Report any side effects. Continue your course of treatment even though you feel ill unless your doctor tells you to stop. In some cases, you may be given additional medicines to help with side effects. Follow all directions for their use. Call your doctor or health care professional for advice if you get a fever, chills or sore throat, or other symptoms of a cold or flu. Do not treat yourself. This drug decreases your body's ability to fight infections. Try to avoid being around people who are sick. This medicine may increase your risk to bruise or bleed. Call your doctor or health care professional if you notice any unusual bleeding. Be careful brushing and flossing your teeth or using a toothpick because you may get an infection or bleed more easily. If you have any dental work done, tell your dentist you are receiving this medicine. Avoid taking products that contain aspirin, acetaminophen, ibuprofen, naproxen, or ketoprofen unless instructed by your doctor. These medicines may hide a fever. Women should inform their doctor if they wish to become pregnant or think they might be pregnant. There is a potential for serious side effects to an unborn child. Talk to your health care professional or pharmacist for more information. Do not breast-feed an infant while taking this medicine. What side effects may I notice from receiving this medicine? Side effects that you should report to your doctor or health care professional as   soon as possible: -allergic reactions like skin rash, itching or hives, swelling of the face, lips, or tongue -low blood counts - this medicine may decrease the number of white blood cells,  red blood cells and platelets. You may be at increased risk for infections and bleeding. -signs of infection - fever or chills, cough, sore throat, pain or difficulty passing urine -signs of decreased platelets or bleeding - bruising, pinpoint red spots on the skin, black, tarry stools, blood in the urine -signs of decreased red blood cells - unusually weak or tired, fainting spells, lightheadedness -breathing problems -chest pain -mouth sores -nausea and vomiting -pain, swelling, redness at site where injected -pain, tingling, numbness in the hands or feet -stomach pain -swelling of ankles, feet, hands -unusual bleeding Side effects that usually do not require medical attention (report to your doctor or health care professional if they continue or are bothersome): -constipation -diarrhea -hair loss -loss of appetite -stomach upset This list may not describe all possible side effects. Call your doctor for medical advice about side effects. You may report side effects to FDA at 1-800-FDA-1088. Where should I keep my medicine? This drug is given in a hospital or clinic and will not be stored at home. NOTE: This sheet is a summary. It may not cover all possible information. If you have questions about this medicine, talk to your doctor, pharmacist, or health care provider.  2015, Elsevier/Gold Standard. (2008-02-28 18:45:54) Nanoparticle Albumin-Bound Paclitaxel injection What is this medicine? NANOPARTICLE ALBUMIN-BOUND PACLITAXEL (Na no PAHR ti kuhl al BYOO muhn-bound PAK li TAX el) is a chemotherapy drug. It targets fast dividing cells, like cancer cells, and causes these cells to die. This medicine is used to treat advanced breast cancer and advanced lung cancer. This medicine may be used for other purposes; ask your health care provider or pharmacist if you have questions. COMMON BRAND NAME(S): Abraxane What should I tell my health care provider before I take this medicine? They need  to know if you have any of these conditions: -kidney disease -liver disease -low blood counts, like low platelets, red blood cells, or white blood cells -recent or ongoing radiation therapy -an unusual or allergic reaction to paclitaxel, albumin, other chemotherapy, other medicines, foods, dyes, or preservatives -pregnant or trying to get pregnant -breast-feeding How should I use this medicine? This drug is given as an infusion into a vein. It is administered in a hospital or clinic by a specially trained health care professional. Talk to your pediatrician regarding the use of this medicine in children. Special care may be needed. Overdosage: If you think you have taken too much of this medicine contact a poison control center or emergency room at once. NOTE: This medicine is only for you. Do not share this medicine with others. What if I miss a dose? It is important not to miss your dose. Call your doctor or health care professional if you are unable to keep an appointment. What may interact with this medicine? -cyclosporine -diazepam -ketoconazole -medicines to increase blood counts like filgrastim, pegfilgrastim, sargramostim -other chemotherapy drugs like cisplatin, doxorubicin, epirubicin, etoposide, teniposide, vincristine -quinidine -testosterone -vaccines -verapamil Talk to your doctor or health care professional before taking any of these medicines: -acetaminophen -aspirin -ibuprofen -ketoprofen -naproxen This list may not describe all possible interactions. Give your health care provider a list of all the medicines, herbs, non-prescription drugs, or dietary supplements you use. Also tell them if you smoke, drink alcohol, or use illegal drugs. Some items may   interact with your medicine. What should I watch for while using this medicine? Your condition will be monitored carefully while you are receiving this medicine. You will need important blood work done while you are  taking this medicine. This drug may make you feel generally unwell. This is not uncommon, as chemotherapy can affect healthy cells as well as cancer cells. Report any side effects. Continue your course of treatment even though you feel ill unless your doctor tells you to stop. In some cases, you may be given additional medicines to help with side effects. Follow all directions for their use. Call your doctor or health care professional for advice if you get a fever, chills or sore throat, or other symptoms of a cold or flu. Do not treat yourself. This drug decreases your body's ability to fight infections. Try to avoid being around people who are sick. This medicine may increase your risk to bruise or bleed. Call your doctor or health care professional if you notice any unusual bleeding. Be careful brushing and flossing your teeth or using a toothpick because you may get an infection or bleed more easily. If you have any dental work done, tell your dentist you are receiving this medicine. Avoid taking products that contain aspirin, acetaminophen, ibuprofen, naproxen, or ketoprofen unless instructed by your doctor. These medicines may hide a fever. Do not become pregnant while taking this medicine. Women should inform their doctor if they wish to become pregnant or think they might be pregnant. There is a potential for serious side effects to an unborn child. Talk to your health care professional or pharmacist for more information. Do not breast-feed an infant while taking this medicine. Men are advised not to father a child while receiving this medicine. What side effects may I notice from receiving this medicine? Side effects that you should report to your doctor or health care professional as soon as possible: -allergic reactions like skin rash, itching or hives, swelling of the face, lips, or tongue -low blood counts - This drug may decrease the number of white blood cells, red blood cells and  platelets. You may be at increased risk for infections and bleeding. -signs of infection - fever or chills, cough, sore throat, pain or difficulty passing urine -signs of decreased platelets or bleeding - bruising, pinpoint red spots on the skin, black, tarry stools, nosebleeds -signs of decreased red blood cells - unusually weak or tired, fainting spells, lightheadedness -breathing problems -changes in vision -chest pain -high or low blood pressure -mouth sores -nausea and vomiting -pain, swelling, redness or irritation at the injection site -pain, tingling, numbness in the hands or feet -slow or irregular heartbeat -swelling of the ankle, feet, hands Side effects that usually do not require medical attention (report to your doctor or health care professional if they continue or are bothersome): -aches, pains -changes in the color of fingernails -diarrhea -hair loss -loss of appetite This list may not describe all possible side effects. Call your doctor for medical advice about side effects. You may report side effects to FDA at 1-800-FDA-1088. Where should I keep my medicine? This drug is given in a hospital or clinic and will not be stored at home. NOTE: This sheet is a summary. It may not cover all possible information. If you have questions about this medicine, talk to your doctor, pharmacist, or health care provider.  2015, Elsevier/Gold Standard. (2012-12-12 16:48:50)  

## 2014-08-22 ENCOUNTER — Ambulatory Visit: Payer: Medicare Other

## 2014-08-22 ENCOUNTER — Other Ambulatory Visit: Payer: Medicare Other | Admitting: Lab

## 2014-08-24 ENCOUNTER — Other Ambulatory Visit (HOSPITAL_BASED_OUTPATIENT_CLINIC_OR_DEPARTMENT_OTHER): Payer: Medicare Other | Admitting: Lab

## 2014-08-24 ENCOUNTER — Ambulatory Visit (HOSPITAL_BASED_OUTPATIENT_CLINIC_OR_DEPARTMENT_OTHER): Payer: Medicare Other

## 2014-08-24 VITALS — BP 149/95 | HR 101 | Temp 97.9°F | Resp 20

## 2014-08-24 DIAGNOSIS — C7800 Secondary malignant neoplasm of unspecified lung: Principal | ICD-10-CM

## 2014-08-24 DIAGNOSIS — K8689 Other specified diseases of pancreas: Secondary | ICD-10-CM

## 2014-08-24 DIAGNOSIS — Z5111 Encounter for antineoplastic chemotherapy: Secondary | ICD-10-CM

## 2014-08-24 DIAGNOSIS — C259 Malignant neoplasm of pancreas, unspecified: Secondary | ICD-10-CM

## 2014-08-24 LAB — CBC WITH DIFFERENTIAL (CANCER CENTER ONLY)
BASO#: 0 10*3/uL (ref 0.0–0.2)
BASO%: 0.2 % (ref 0.0–2.0)
EOS ABS: 0.1 10*3/uL (ref 0.0–0.5)
EOS%: 1.1 % (ref 0.0–7.0)
HEMATOCRIT: 35.5 % (ref 34.8–46.6)
HEMOGLOBIN: 12.1 g/dL (ref 11.6–15.9)
LYMPH#: 4.9 10*3/uL — ABNORMAL HIGH (ref 0.9–3.3)
LYMPH%: 40.4 % (ref 14.0–48.0)
MCH: 24.4 pg — ABNORMAL LOW (ref 26.0–34.0)
MCHC: 34.1 g/dL (ref 32.0–36.0)
MCV: 72 fL — ABNORMAL LOW (ref 81–101)
MONO#: 1.6 10*3/uL — ABNORMAL HIGH (ref 0.1–0.9)
MONO%: 12.9 % (ref 0.0–13.0)
NEUT%: 45.4 % (ref 39.6–80.0)
NEUTROS ABS: 5.5 10*3/uL (ref 1.5–6.5)
Platelets: 306 10*3/uL (ref 145–400)
RBC: 4.96 10*6/uL (ref 3.70–5.32)
RDW: 17.6 % — ABNORMAL HIGH (ref 11.1–15.7)
WBC: 12.2 10*3/uL — ABNORMAL HIGH (ref 3.9–10.0)

## 2014-08-24 LAB — COMPREHENSIVE METABOLIC PANEL
ALBUMIN: 4.2 g/dL (ref 3.5–5.2)
ALT: 34 U/L (ref 0–35)
AST: 22 U/L (ref 0–37)
Alkaline Phosphatase: 139 U/L — ABNORMAL HIGH (ref 39–117)
BUN: 19 mg/dL (ref 6–23)
CHLORIDE: 107 meq/L (ref 96–112)
CO2: 17 mEq/L — ABNORMAL LOW (ref 19–32)
CREATININE: 0.83 mg/dL (ref 0.50–1.10)
Calcium: 9.8 mg/dL (ref 8.4–10.5)
GLUCOSE: 302 mg/dL — AB (ref 70–99)
POTASSIUM: 4.2 meq/L (ref 3.5–5.3)
Sodium: 138 mEq/L (ref 135–145)
Total Bilirubin: 0.3 mg/dL (ref 0.2–1.2)
Total Protein: 7 g/dL (ref 6.0–8.3)

## 2014-08-24 LAB — TECHNOLOGIST REVIEW CHCC SATELLITE

## 2014-08-24 MED ORDER — SODIUM CHLORIDE 0.9 % IV SOLN
Freq: Once | INTRAVENOUS | Status: AC
  Administered 2014-08-24: 14:00:00 via INTRAVENOUS

## 2014-08-24 MED ORDER — DEXAMETHASONE SODIUM PHOSPHATE 10 MG/ML IJ SOLN
10.0000 mg | Freq: Once | INTRAMUSCULAR | Status: AC
  Administered 2014-08-24: 10 mg via INTRAVENOUS

## 2014-08-24 MED ORDER — ONDANSETRON 8 MG/50ML IVPB (CHCC)
8.0000 mg | Freq: Once | INTRAVENOUS | Status: AC
  Administered 2014-08-24: 8 mg via INTRAVENOUS

## 2014-08-24 MED ORDER — PACLITAXEL PROTEIN-BOUND CHEMO INJECTION 100 MG
125.0000 mg/m2 | Freq: Once | INTRAVENOUS | Status: AC
  Administered 2014-08-24: 200 mg via INTRAVENOUS
  Filled 2014-08-24: qty 40

## 2014-08-24 MED ORDER — SODIUM CHLORIDE 0.9 % IJ SOLN
10.0000 mL | INTRAMUSCULAR | Status: DC | PRN
  Administered 2014-08-24: 10 mL
  Filled 2014-08-24: qty 10

## 2014-08-24 MED ORDER — HEPARIN SOD (PORK) LOCK FLUSH 100 UNIT/ML IV SOLN
500.0000 [IU] | Freq: Once | INTRAVENOUS | Status: AC | PRN
  Administered 2014-08-24: 500 [IU]
  Filled 2014-08-24: qty 5

## 2014-08-24 MED ORDER — SODIUM CHLORIDE 0.9 % IV SOLN
1600.0000 mg | Freq: Once | INTRAVENOUS | Status: AC
  Administered 2014-08-24: 1600 mg via INTRAVENOUS
  Filled 2014-08-24: qty 42.08

## 2014-08-24 MED ORDER — DEXAMETHASONE SODIUM PHOSPHATE 10 MG/ML IJ SOLN
INTRAMUSCULAR | Status: AC
  Filled 2014-08-24: qty 1

## 2014-08-24 NOTE — Patient Instructions (Signed)
Gemcitabine injection What is this medicine? GEMCITABINE (jem SIT a been) is a chemotherapy drug. This medicine is used to treat many types of cancer like breast cancer, lung cancer, pancreatic cancer, and ovarian cancer. This medicine may be used for other purposes; ask your health care provider or pharmacist if you have questions. COMMON BRAND NAME(S): Gemzar What should I tell my health care provider before I take this medicine? They need to know if you have any of these conditions: -blood disorders -infection -kidney disease -liver disease -recent or ongoing radiation therapy -an unusual or allergic reaction to gemcitabine, other chemotherapy, other medicines, foods, dyes, or preservatives -pregnant or trying to get pregnant -breast-feeding How should I use this medicine? This drug is given as an infusion into a vein. It is administered in a hospital or clinic by a specially trained health care professional. Talk to your pediatrician regarding the use of this medicine in children. Special care may be needed. Overdosage: If you think you have taken too much of this medicine contact a poison control center or emergency room at once. NOTE: This medicine is only for you. Do not share this medicine with others. What if I miss a dose? It is important not to miss your dose. Call your doctor or health care professional if you are unable to keep an appointment. What may interact with this medicine? -medicines to increase blood counts like filgrastim, pegfilgrastim, sargramostim -some other chemotherapy drugs like cisplatin -vaccines Talk to your doctor or health care professional before taking any of these medicines: -acetaminophen -aspirin -ibuprofen -ketoprofen -naproxen This list may not describe all possible interactions. Give your health care provider a list of all the medicines, herbs, non-prescription drugs, or dietary supplements you use. Also tell them if you smoke, drink alcohol,  or use illegal drugs. Some items may interact with your medicine. What should I watch for while using this medicine? Visit your doctor for checks on your progress. This drug may make you feel generally unwell. This is not uncommon, as chemotherapy can affect healthy cells as well as cancer cells. Report any side effects. Continue your course of treatment even though you feel ill unless your doctor tells you to stop. In some cases, you may be given additional medicines to help with side effects. Follow all directions for their use. Call your doctor or health care professional for advice if you get a fever, chills or sore throat, or other symptoms of a cold or flu. Do not treat yourself. This drug decreases your body's ability to fight infections. Try to avoid being around people who are sick. This medicine may increase your risk to bruise or bleed. Call your doctor or health care professional if you notice any unusual bleeding. Be careful brushing and flossing your teeth or using a toothpick because you may get an infection or bleed more easily. If you have any dental work done, tell your dentist you are receiving this medicine. Avoid taking products that contain aspirin, acetaminophen, ibuprofen, naproxen, or ketoprofen unless instructed by your doctor. These medicines may hide a fever. Women should inform their doctor if they wish to become pregnant or think they might be pregnant. There is a potential for serious side effects to an unborn child. Talk to your health care professional or pharmacist for more information. Do not breast-feed an infant while taking this medicine. What side effects may I notice from receiving this medicine? Side effects that you should report to your doctor or health care professional as   soon as possible: -allergic reactions like skin rash, itching or hives, swelling of the face, lips, or tongue -low blood counts - this medicine may decrease the number of white blood cells,  red blood cells and platelets. You may be at increased risk for infections and bleeding. -signs of infection - fever or chills, cough, sore throat, pain or difficulty passing urine -signs of decreased platelets or bleeding - bruising, pinpoint red spots on the skin, black, tarry stools, blood in the urine -signs of decreased red blood cells - unusually weak or tired, fainting spells, lightheadedness -breathing problems -chest pain -mouth sores -nausea and vomiting -pain, swelling, redness at site where injected -pain, tingling, numbness in the hands or feet -stomach pain -swelling of ankles, feet, hands -unusual bleeding Side effects that usually do not require medical attention (report to your doctor or health care professional if they continue or are bothersome): -constipation -diarrhea -hair loss -loss of appetite -stomach upset This list may not describe all possible side effects. Call your doctor for medical advice about side effects. You may report side effects to FDA at 1-800-FDA-1088. Where should I keep my medicine? This drug is given in a hospital or clinic and will not be stored at home. NOTE: This sheet is a summary. It may not cover all possible information. If you have questions about this medicine, talk to your doctor, pharmacist, or health care provider.  2015, Elsevier/Gold Standard. (2008-02-28 18:45:54)   Nanoparticle Albumin-Bound Paclitaxel injection What is this medicine? NANOPARTICLE ALBUMIN-BOUND PACLITAXEL (Na no PAHR ti kuhl al BYOO muhn-bound PAK li TAX el) is a chemotherapy drug. It targets fast dividing cells, like cancer cells, and causes these cells to die. This medicine is used to treat advanced breast cancer and advanced lung cancer. This medicine may be used for other purposes; ask your health care provider or pharmacist if you have questions. COMMON BRAND NAME(S): Abraxane What should I tell my health care provider before I take this medicine? They  need to know if you have any of these conditions: -kidney disease -liver disease -low blood counts, like low platelets, red blood cells, or white blood cells -recent or ongoing radiation therapy -an unusual or allergic reaction to paclitaxel, albumin, other chemotherapy, other medicines, foods, dyes, or preservatives -pregnant or trying to get pregnant -breast-feeding How should I use this medicine? This drug is given as an infusion into a vein. It is administered in a hospital or clinic by a specially trained health care professional. Talk to your pediatrician regarding the use of this medicine in children. Special care may be needed. Overdosage: If you think you have taken too much of this medicine contact a poison control center or emergency room at once. NOTE: This medicine is only for you. Do not share this medicine with others. What if I miss a dose? It is important not to miss your dose. Call your doctor or health care professional if you are unable to keep an appointment. What may interact with this medicine? -cyclosporine -diazepam -ketoconazole -medicines to increase blood counts like filgrastim, pegfilgrastim, sargramostim -other chemotherapy drugs like cisplatin, doxorubicin, epirubicin, etoposide, teniposide, vincristine -quinidine -testosterone -vaccines -verapamil Talk to your doctor or health care professional before taking any of these medicines: -acetaminophen -aspirin -ibuprofen -ketoprofen -naproxen This list may not describe all possible interactions. Give your health care provider a list of all the medicines, herbs, non-prescription drugs, or dietary supplements you use. Also tell them if you smoke, drink alcohol, or use illegal drugs. Some   items may interact with your medicine. What should I watch for while using this medicine? Your condition will be monitored carefully while you are receiving this medicine. You will need important blood work done while you are  taking this medicine. This drug may make you feel generally unwell. This is not uncommon, as chemotherapy can affect healthy cells as well as cancer cells. Report any side effects. Continue your course of treatment even though you feel ill unless your doctor tells you to stop. In some cases, you may be given additional medicines to help with side effects. Follow all directions for their use. Call your doctor or health care professional for advice if you get a fever, chills or sore throat, or other symptoms of a cold or flu. Do not treat yourself. This drug decreases your body's ability to fight infections. Try to avoid being around people who are sick. This medicine may increase your risk to bruise or bleed. Call your doctor or health care professional if you notice any unusual bleeding. Be careful brushing and flossing your teeth or using a toothpick because you may get an infection or bleed more easily. If you have any dental work done, tell your dentist you are receiving this medicine. Avoid taking products that contain aspirin, acetaminophen, ibuprofen, naproxen, or ketoprofen unless instructed by your doctor. These medicines may hide a fever. Do not become pregnant while taking this medicine. Women should inform their doctor if they wish to become pregnant or think they might be pregnant. There is a potential for serious side effects to an unborn child. Talk to your health care professional or pharmacist for more information. Do not breast-feed an infant while taking this medicine. Men are advised not to father a child while receiving this medicine. What side effects may I notice from receiving this medicine? Side effects that you should report to your doctor or health care professional as soon as possible: -allergic reactions like skin rash, itching or hives, swelling of the face, lips, or tongue -low blood counts - This drug may decrease the number of white blood cells, red blood cells and  platelets. You may be at increased risk for infections and bleeding. -signs of infection - fever or chills, cough, sore throat, pain or difficulty passing urine -signs of decreased platelets or bleeding - bruising, pinpoint red spots on the skin, black, tarry stools, nosebleeds -signs of decreased red blood cells - unusually weak or tired, fainting spells, lightheadedness -breathing problems -changes in vision -chest pain -high or low blood pressure -mouth sores Implanted Port Insertion, Care After Refer to this sheet in the next few weeks. These instructions provide you with information on caring for yourself after your procedure. Your health care provider may also give you more specific instructions. Your treatment has been planned according to current medical practices, but problems sometimes occur. Call your health care provider if you have any problems or questions after your procedure. WHAT TO EXPECT AFTER THE PROCEDURE After your procedure, it is typical to have the following:   Discomfort at the port insertion site. Ice packs to the area will help.  Bruising on the skin over the port. This will subside in 3-4 days. HOME CARE INSTRUCTIONS  After your port is placed, you will get a manufacturer's information card. The card has information about your port. Keep this card with you at all times.   Know what kind of port you have. There are many types of ports available.   Wear a medical   alert bracelet in case of an emergency. This can help alert health care workers that you have a port.   The port can stay in for as long as your health care provider believes it is necessary.   A home health care nurse may give medicines and take care of the port.   You or a family member can get special training and directions for giving medicine and taking care of the port at home.  SEEK MEDICAL CARE IF:   Your port does not flush or you are unable to get a blood return.   You have a  fever or chills. SEEK IMMEDIATE MEDICAL CARE IF:  You have new fluid or pus coming from your incision.   You notice a bad smell coming from your incision site.   You have swelling, pain, or more redness at the incision or port site.   You have chest pain or shortness of breath. Document Released: 08/09/2013 Document Revised: 10/24/2013 Document Reviewed: 08/09/2013 ExitCare Patient Information 2015 [employer]. This information is not intended to replace advice given to you by your health care provider. Make sure you discuss any questions you have with your health care provider.  

## 2014-08-29 ENCOUNTER — Ambulatory Visit (HOSPITAL_BASED_OUTPATIENT_CLINIC_OR_DEPARTMENT_OTHER): Payer: Medicare Other

## 2014-08-29 ENCOUNTER — Other Ambulatory Visit (HOSPITAL_BASED_OUTPATIENT_CLINIC_OR_DEPARTMENT_OTHER): Payer: Medicare Other | Admitting: Lab

## 2014-08-29 VITALS — BP 151/71 | HR 90 | Temp 98.1°F | Resp 20

## 2014-08-29 DIAGNOSIS — C7800 Secondary malignant neoplasm of unspecified lung: Secondary | ICD-10-CM

## 2014-08-29 DIAGNOSIS — C259 Malignant neoplasm of pancreas, unspecified: Secondary | ICD-10-CM

## 2014-08-29 DIAGNOSIS — C250 Malignant neoplasm of head of pancreas: Secondary | ICD-10-CM

## 2014-08-29 DIAGNOSIS — Z5111 Encounter for antineoplastic chemotherapy: Secondary | ICD-10-CM

## 2014-08-29 DIAGNOSIS — K8689 Other specified diseases of pancreas: Secondary | ICD-10-CM

## 2014-08-29 LAB — CMP (CANCER CENTER ONLY)
ALBUMIN: 3.4 g/dL (ref 3.3–5.5)
ALK PHOS: 114 U/L — AB (ref 26–84)
ALT(SGPT): 69 U/L — ABNORMAL HIGH (ref 10–47)
AST: 47 U/L — ABNORMAL HIGH (ref 11–38)
BUN: 13 mg/dL (ref 7–22)
CO2: 20 mEq/L (ref 18–33)
Calcium: 9.4 mg/dL (ref 8.0–10.3)
Chloride: 103 mEq/L (ref 98–108)
Creat: 0.7 mg/dl (ref 0.6–1.2)
GLUCOSE: 363 mg/dL — AB (ref 73–118)
POTASSIUM: 3.6 meq/L (ref 3.3–4.7)
Sodium: 137 mEq/L (ref 128–145)
Total Bilirubin: 0.7 mg/dl (ref 0.20–1.60)
Total Protein: 7.1 g/dL (ref 6.4–8.1)

## 2014-08-29 LAB — CBC WITH DIFFERENTIAL (CANCER CENTER ONLY)
BASO#: 0.1 10*3/uL (ref 0.0–0.2)
BASO%: 0.8 % (ref 0.0–2.0)
EOS ABS: 0.1 10*3/uL (ref 0.0–0.5)
EOS%: 0.8 % (ref 0.0–7.0)
HCT: 35.3 % (ref 34.8–46.6)
HEMOGLOBIN: 11.9 g/dL (ref 11.6–15.9)
LYMPH#: 2.3 10*3/uL (ref 0.9–3.3)
LYMPH%: 35.6 % (ref 14.0–48.0)
MCH: 24.2 pg — AB (ref 26.0–34.0)
MCHC: 33.7 g/dL (ref 32.0–36.0)
MCV: 72 fL — ABNORMAL LOW (ref 81–101)
MONO#: 0.1 10*3/uL (ref 0.1–0.9)
MONO%: 1.4 % (ref 0.0–13.0)
NEUT%: 61.4 % (ref 39.6–80.0)
NEUTROS ABS: 3.9 10*3/uL (ref 1.5–6.5)
Platelets: 316 10*3/uL (ref 145–400)
RBC: 4.91 10*6/uL (ref 3.70–5.32)
RDW: 16.2 % — ABNORMAL HIGH (ref 11.1–15.7)
WBC: 6.4 10*3/uL (ref 3.9–10.0)

## 2014-08-29 MED ORDER — SODIUM CHLORIDE 0.9 % IJ SOLN
10.0000 mL | INTRAMUSCULAR | Status: DC | PRN
  Administered 2014-08-29: 10 mL
  Filled 2014-08-29: qty 10

## 2014-08-29 MED ORDER — SODIUM CHLORIDE 0.9 % IV SOLN
Freq: Once | INTRAVENOUS | Status: AC
  Administered 2014-08-29: 11:00:00 via INTRAVENOUS

## 2014-08-29 MED ORDER — HEPARIN SOD (PORK) LOCK FLUSH 100 UNIT/ML IV SOLN
500.0000 [IU] | Freq: Once | INTRAVENOUS | Status: AC | PRN
  Administered 2014-08-29: 500 [IU]
  Filled 2014-08-29: qty 5

## 2014-08-29 MED ORDER — DEXAMETHASONE SODIUM PHOSPHATE 10 MG/ML IJ SOLN
10.0000 mg | Freq: Once | INTRAMUSCULAR | Status: AC
  Administered 2014-08-29: 10 mg via INTRAVENOUS

## 2014-08-29 MED ORDER — DEXAMETHASONE SODIUM PHOSPHATE 10 MG/ML IJ SOLN
INTRAMUSCULAR | Status: AC
  Filled 2014-08-29: qty 1

## 2014-08-29 MED ORDER — ONDANSETRON 8 MG/50ML IVPB (CHCC)
8.0000 mg | Freq: Once | INTRAVENOUS | Status: AC
  Administered 2014-08-29: 8 mg via INTRAVENOUS

## 2014-08-29 MED ORDER — SODIUM CHLORIDE 0.9 % IV SOLN
1600.0000 mg | Freq: Once | INTRAVENOUS | Status: AC
  Administered 2014-08-29: 1600 mg via INTRAVENOUS
  Filled 2014-08-29: qty 42.08

## 2014-08-29 MED ORDER — PACLITAXEL PROTEIN-BOUND CHEMO INJECTION 100 MG
125.0000 mg/m2 | Freq: Once | INTRAVENOUS | Status: AC
  Administered 2014-08-29: 200 mg via INTRAVENOUS
  Filled 2014-08-29: qty 40

## 2014-08-29 NOTE — Patient Instructions (Signed)
Gemcitabine injection What is this medicine? GEMCITABINE (jem SIT a been) is a chemotherapy drug. This medicine is used to treat many types of cancer like breast cancer, lung cancer, pancreatic cancer, and ovarian cancer. This medicine may be used for other purposes; ask your health care provider or pharmacist if you have questions. COMMON BRAND NAME(S): Gemzar What should I tell my health care provider before I take this medicine? They need to know if you have any of these conditions: -blood disorders -infection -kidney disease -liver disease -recent or ongoing radiation therapy -an unusual or allergic reaction to gemcitabine, other chemotherapy, other medicines, foods, dyes, or preservatives -pregnant or trying to get pregnant -breast-feeding How should I use this medicine? This drug is given as an infusion into a vein. It is administered in a hospital or clinic by a specially trained health care professional. Talk to your pediatrician regarding the use of this medicine in children. Special care may be needed. Overdosage: If you think you have taken too much of this medicine contact a poison control center or emergency room at once. NOTE: This medicine is only for you. Do not share this medicine with others. What if I miss a dose? It is important not to miss your dose. Call your doctor or health care professional if you are unable to keep an appointment. What may interact with this medicine? -medicines to increase blood counts like filgrastim, pegfilgrastim, sargramostim -some other chemotherapy drugs like cisplatin -vaccines Talk to your doctor or health care professional before taking any of these medicines: -acetaminophen -aspirin -ibuprofen -ketoprofen -naproxen This list may not describe all possible interactions. Give your health care provider a list of all the medicines, herbs, non-prescription drugs, or dietary supplements you use. Also tell them if you smoke, drink alcohol,  or use illegal drugs. Some items may interact with your medicine. What should I watch for while using this medicine? Visit your doctor for checks on your progress. This drug may make you feel generally unwell. This is not uncommon, as chemotherapy can affect healthy cells as well as cancer cells. Report any side effects. Continue your course of treatment even though you feel ill unless your doctor tells you to stop. In some cases, you may be given additional medicines to help with side effects. Follow all directions for their use. Call your doctor or health care professional for advice if you get a fever, chills or sore throat, or other symptoms of a cold or flu. Do not treat yourself. This drug decreases your body's ability to fight infections. Try to avoid being around people who are sick. This medicine may increase your risk to bruise or bleed. Call your doctor or health care professional if you notice any unusual bleeding. Be careful brushing and flossing your teeth or using a toothpick because you may get an infection or bleed more easily. If you have any dental work done, tell your dentist you are receiving this medicine. Avoid taking products that contain aspirin, acetaminophen, ibuprofen, naproxen, or ketoprofen unless instructed by your doctor. These medicines may hide a fever. Women should inform their doctor if they wish to become pregnant or think they might be pregnant. There is a potential for serious side effects to an unborn child. Talk to your health care professional or pharmacist for more information. Do not breast-feed an infant while taking this medicine. What side effects may I notice from receiving this medicine? Side effects that you should report to your doctor or health care professional as   soon as possible: -allergic reactions like skin rash, itching or hives, swelling of the face, lips, or tongue -low blood counts - this medicine may decrease the number of white blood cells,  red blood cells and platelets. You may be at increased risk for infections and bleeding. -signs of infection - fever or chills, cough, sore throat, pain or difficulty passing urine -signs of decreased platelets or bleeding - bruising, pinpoint red spots on the skin, black, tarry stools, blood in the urine -signs of decreased red blood cells - unusually weak or tired, fainting spells, lightheadedness -breathing problems -chest pain -mouth sores -nausea and vomiting -pain, swelling, redness at site where injected -pain, tingling, numbness in the hands or feet -stomach pain -swelling of ankles, feet, hands -unusual bleeding Side effects that usually do not require medical attention (report to your doctor or health care professional if they continue or are bothersome): -constipation -diarrhea -hair loss -loss of appetite -stomach upset This list may not describe all possible side effects. Call your doctor for medical advice about side effects. You may report side effects to FDA at 1-800-FDA-1088. Where should I keep my medicine? This drug is given in a hospital or clinic and will not be stored at home. NOTE: This sheet is a summary. It may not cover all possible information. If you have questions about this medicine, talk to your doctor, pharmacist, or health care provider.  2015, Elsevier/Gold Standard. (2008-02-28 18:45:54)   Nanoparticle Albumin-Bound Paclitaxel injection What is this medicine? NANOPARTICLE ALBUMIN-BOUND PACLITAXEL (Na no PAHR ti kuhl al BYOO muhn-bound PAK li TAX el) is a chemotherapy drug. It targets fast dividing cells, like cancer cells, and causes these cells to die. This medicine is used to treat advanced breast cancer and advanced lung cancer. This medicine may be used for other purposes; ask your health care provider or pharmacist if you have questions. COMMON BRAND NAME(S): Abraxane What should I tell my health care provider before I take this medicine? They  need to know if you have any of these conditions: -kidney disease -liver disease -low blood counts, like low platelets, red blood cells, or white blood cells -recent or ongoing radiation therapy -an unusual or allergic reaction to paclitaxel, albumin, other chemotherapy, other medicines, foods, dyes, or preservatives -pregnant or trying to get pregnant -breast-feeding How should I use this medicine? This drug is given as an infusion into a vein. It is administered in a hospital or clinic by a specially trained health care professional. Talk to your pediatrician regarding the use of this medicine in children. Special care may be needed. Overdosage: If you think you have taken too much of this medicine contact a poison control center or emergency room at once. NOTE: This medicine is only for you. Do not share this medicine with others. What if I miss a dose? It is important not to miss your dose. Call your doctor or health care professional if you are unable to keep an appointment. What may interact with this medicine? -cyclosporine -diazepam -ketoconazole -medicines to increase blood counts like filgrastim, pegfilgrastim, sargramostim -other chemotherapy drugs like cisplatin, doxorubicin, epirubicin, etoposide, teniposide, vincristine -quinidine -testosterone -vaccines -verapamil Talk to your doctor or health care professional before taking any of these medicines: -acetaminophen -aspirin -ibuprofen -ketoprofen -naproxen This list may not describe all possible interactions. Give your health care provider a list of all the medicines, herbs, non-prescription drugs, or dietary supplements you use. Also tell them if you smoke, drink alcohol, or use illegal drugs. Some   items may interact with your medicine. What should I watch for while using this medicine? Your condition will be monitored carefully while you are receiving this medicine. You will need important blood work done while you are  taking this medicine. This drug may make you feel generally unwell. This is not uncommon, as chemotherapy can affect healthy cells as well as cancer cells. Report any side effects. Continue your course of treatment even though you feel ill unless your doctor tells you to stop. In some cases, you may be given additional medicines to help with side effects. Follow all directions for their use. Call your doctor or health care professional for advice if you get a fever, chills or sore throat, or other symptoms of a cold or flu. Do not treat yourself. This drug decreases your body's ability to fight infections. Try to avoid being around people who are sick. This medicine may increase your risk to bruise or bleed. Call your doctor or health care professional if you notice any unusual bleeding. Be careful brushing and flossing your teeth or using a toothpick because you may get an infection or bleed more easily. If you have any dental work done, tell your dentist you are receiving this medicine. Avoid taking products that contain aspirin, acetaminophen, ibuprofen, naproxen, or ketoprofen unless instructed by your doctor. These medicines may hide a fever. Do not become pregnant while taking this medicine. Women should inform their doctor if they wish to become pregnant or think they might be pregnant. There is a potential for serious side effects to an unborn child. Talk to your health care professional or pharmacist for more information. Do not breast-feed an infant while taking this medicine. Men are advised not to father a child while receiving this medicine. What side effects may I notice from receiving this medicine? Side effects that you should report to your doctor or health care professional as soon as possible: -allergic reactions like skin rash, itching or hives, swelling of the face, lips, or tongue -low blood counts - This drug may decrease the number of white blood cells, red blood cells and  platelets. You may be at increased risk for infections and bleeding. -signs of infection - fever or chills, cough, sore throat, pain or difficulty passing urine -signs of decreased platelets or bleeding - bruising, pinpoint red spots on the skin, black, tarry stools, nosebleeds -signs of decreased red blood cells - unusually weak or tired, fainting spells, lightheadedness -breathing problems -changes in vision -chest pain -high or low blood pressure -mouth sores Implanted Port Insertion, Care After Refer to this sheet in the next few weeks. These instructions provide you with information on caring for yourself after your procedure. Your health care provider may also give you more specific instructions. Your treatment has been planned according to current medical practices, but problems sometimes occur. Call your health care provider if you have any problems or questions after your procedure. WHAT TO EXPECT AFTER THE PROCEDURE After your procedure, it is typical to have the following:   Discomfort at the port insertion site. Ice packs to the area will help.  Bruising on the skin over the port. This will subside in 3-4 days. HOME CARE INSTRUCTIONS  After your port is placed, you will get a manufacturer's information card. The card has information about your port. Keep this card with you at all times.   Know what kind of port you have. There are many types of ports available.   Wear a medical   alert bracelet in case of an emergency. This can help alert health care workers that you have a port.   The port can stay in for as long as your health care provider believes it is necessary.   A home health care nurse may give medicines and take care of the port.   You or a family member can get special training and directions for giving medicine and taking care of the port at home.  SEEK MEDICAL CARE IF:   Your port does not flush or you are unable to get a blood return.   You have a  fever or chills. SEEK IMMEDIATE MEDICAL CARE IF:  You have new fluid or pus coming from your incision.   You notice a bad smell coming from your incision site.   You have swelling, pain, or more redness at the incision or port site.   You have chest pain or shortness of breath. Document Released: 08/09/2013 Document Revised: 10/24/2013 Document Reviewed: 08/09/2013 ExitCare Patient Information 2015 [employer]. This information is not intended to replace advice given to you by your health care provider. Make sure you discuss any questions you have with your health care provider.  

## 2014-08-30 ENCOUNTER — Other Ambulatory Visit: Payer: Self-pay | Admitting: *Deleted

## 2014-08-30 ENCOUNTER — Telehealth: Payer: Self-pay | Admitting: *Deleted

## 2014-08-30 DIAGNOSIS — C259 Malignant neoplasm of pancreas, unspecified: Secondary | ICD-10-CM

## 2014-08-30 DIAGNOSIS — C7800 Secondary malignant neoplasm of unspecified lung: Principal | ICD-10-CM

## 2014-08-30 MED ORDER — GLYBURIDE-METFORMIN 2.5-500 MG PO TABS: 1.0000 | ORAL_TABLET | Freq: Two times a day (BID) | ORAL | Status: DC

## 2014-08-30 MED ORDER — GLIPIZIDE-METFORMIN HCL 5-500 MG PO TABS: 1.0000 | ORAL_TABLET | Freq: Two times a day (BID) | ORAL | Status: DC

## 2014-08-30 NOTE — Telephone Encounter (Addendum)
Message relayed to patient. She is receptive and will start to take the medication. She already has an appointment with Dr E set up within the next two weeks.   Message copied by Cordelia Poche on Thu Aug 30, 2014 12:47 PM ------      Message from: Burney Gauze R      Created: Thu Aug 30, 2014  7:18 AM       Please call and let her know that her blood sugars are way too high. She is probably diabetic due to the cancer.  She needs to be on Glucovance (2.5/500) 1 po bid.  pete ------

## 2014-09-01 ENCOUNTER — Emergency Department (HOSPITAL_COMMUNITY)
Admission: EM | Admit: 2014-09-01 | Discharge: 2014-09-01 | Disposition: A | Discharge status: Home / Self Care | Payer: Medicare Other | Attending: Emergency Medicine | Admitting: Emergency Medicine

## 2014-09-01 ENCOUNTER — Encounter (HOSPITAL_COMMUNITY): Payer: Self-pay | Admitting: Emergency Medicine

## 2014-09-01 DIAGNOSIS — Z8507 Personal history of malignant neoplasm of pancreas: Secondary | ICD-10-CM | POA: Insufficient documentation

## 2014-09-01 DIAGNOSIS — E119 Type 2 diabetes mellitus without complications: Secondary | ICD-10-CM | POA: Diagnosis not present

## 2014-09-01 DIAGNOSIS — I10 Essential (primary) hypertension: Secondary | ICD-10-CM | POA: Diagnosis not present

## 2014-09-01 DIAGNOSIS — Z79899 Other long term (current) drug therapy: Secondary | ICD-10-CM | POA: Insufficient documentation

## 2014-09-01 DIAGNOSIS — K921 Melena: Secondary | ICD-10-CM | POA: Insufficient documentation

## 2014-09-01 DIAGNOSIS — Z87440 Personal history of urinary (tract) infections: Secondary | ICD-10-CM | POA: Insufficient documentation

## 2014-09-01 DIAGNOSIS — Z87891 Personal history of nicotine dependence: Secondary | ICD-10-CM | POA: Insufficient documentation

## 2014-09-01 DIAGNOSIS — C7800 Secondary malignant neoplasm of unspecified lung: Secondary | ICD-10-CM | POA: Insufficient documentation

## 2014-09-01 LAB — CBC WITH DIFFERENTIAL/PLATELET
BASOS ABS: 0 10*3/uL (ref 0.0–0.1)
BASOS PCT: 0 % (ref 0–1)
EOS ABS: 0.1 10*3/uL (ref 0.0–0.7)
Eosinophils Relative: 1 % (ref 0–5)
HCT: 33.9 % — ABNORMAL LOW (ref 36.0–46.0)
Hemoglobin: 11.3 g/dL — ABNORMAL LOW (ref 12.0–15.0)
Lymphocytes Relative: 27 % (ref 12–46)
Lymphs Abs: 1.5 10*3/uL (ref 0.7–4.0)
MCH: 24 pg — ABNORMAL LOW (ref 26.0–34.0)
MCHC: 33.3 g/dL (ref 30.0–36.0)
MCV: 72 fL — ABNORMAL LOW (ref 78.0–100.0)
MONO ABS: 0.1 10*3/uL (ref 0.1–1.0)
Monocytes Relative: 1 % — ABNORMAL LOW (ref 3–12)
Neutro Abs: 4 10*3/uL (ref 1.7–7.7)
Neutrophils Relative %: 71 % (ref 43–77)
Platelets: 249 10*3/uL (ref 150–400)
RBC: 4.71 MIL/uL (ref 3.87–5.11)
RDW: 15.6 % — AB (ref 11.5–15.5)
WBC: 5.6 10*3/uL (ref 4.0–10.5)

## 2014-09-01 LAB — COMPREHENSIVE METABOLIC PANEL
ALBUMIN: 3.2 g/dL — AB (ref 3.5–5.2)
ALT: 85 U/L — ABNORMAL HIGH (ref 0–35)
ANION GAP: 15 (ref 5–15)
AST: 48 U/L — ABNORMAL HIGH (ref 0–37)
Alkaline Phosphatase: 121 U/L — ABNORMAL HIGH (ref 39–117)
BILIRUBIN TOTAL: 0.6 mg/dL (ref 0.3–1.2)
BUN: 11 mg/dL (ref 6–23)
CALCIUM: 9.3 mg/dL (ref 8.4–10.5)
CHLORIDE: 102 meq/L (ref 96–112)
CO2: 19 mEq/L (ref 19–32)
CREATININE: 0.64 mg/dL (ref 0.50–1.10)
GFR calc Af Amer: >90 mL/min (ref >90)
GFR calc non Af Amer: >90 mL/min (ref >90)
Glucose, Bld: 244 mg/dL — ABNORMAL HIGH (ref 70–99)
Potassium: 3.5 mEq/L — ABNORMAL LOW (ref 3.7–5.3)
Sodium: 136 mEq/L — ABNORMAL LOW (ref 137–147)
TOTAL PROTEIN: 7.3 g/dL (ref 6.0–8.3)

## 2014-09-01 LAB — POC OCCULT BLOOD, ED: FECAL OCCULT BLD: NEGATIVE

## 2014-09-01 NOTE — ED Provider Notes (Signed)
Medical screening examination/treatment/procedure(s) were conducted as a shared visit with non-physician practitioner(s) or resident and myself. I personally evaluated the patient during the encounter and agree with the findings.  I have personally reviewed any xrays and/ or EKG's with the provider and I agree with interpretation.  Patient with pancreatic cancer, on chemotherapy and follow closely outpatient, stomach ulcer history presents after episode of blood in the stool. Patient is not had any episodes in the ER, no diarrhea in the ER. Mild tachycardia, abdomen soft nontender no guarding. Patient well-appearing currently. Patient has outpatient follow-up and vitals and blood work overall unremarkable.  Labs Reviewed   CBC WITH DIFFERENTIAL - Abnormal; Notable for the following:    Hemoglobin  11.3 (*)     HCT  33.9 (*)     MCV  72.0 (*)     MCH  24.0 (*)     RDW  15.6 (*)     Monocytes Relative  1 (*)     All other components within normal limits   COMPREHENSIVE METABOLIC PANEL - Abnormal; Notable for the following:    Sodium  136 (*)     Potassium  3.5 (*)     Glucose, Bld  244 (*)     Albumin  3.2 (*)     AST  48 (*)     ALT  85 (*)     Alkaline Phosphatase  121 (*)     All other components within normal limits   OCCULT BLOOD X 1 CARD TO LAB, STOOL   POC OCCULT BLOOD, ED    Filed Vitals:    09/01/14 1145  09/01/14 1200  09/01/14 1230  09/01/14 1300   BP:  151/73  145/70  138/65  128/70   Pulse:  112  99  100  100   Temp:       TempSrc:       Resp:  20  11  11  14    SpO2:  100%  99%  99%  100%    Diarrhea, blood in the stool   Mariea Clonts, MD 09/01/14 1614

## 2014-09-01 NOTE — Discharge Instructions (Signed)
Bloody Diarrhea °Bloody diarrhea can be caused by many different conditions. Most of the time bloody diarrhea is the result of food poisoning or minor infections. Bloody diarrhea usually improves over 2 to 3 days of rest and fluid replacement. Other conditions that can cause bloody diarrhea include: °· Internal bleeding. °· Infection. °· Diseases of the bowel and colon. °Internal bleeding from an ulcer or bowel disease can be severe and requires hospital care or even surgery. °DIAGNOSIS  °To find out what is wrong your caregiver may check your: °· Stool. °· Blood. °· Results from a test that looks inside the body (endoscopy). °TREATMENT  °· Get plenty of rest. °· Drink enough water and fluids to keep your urine clear or pale yellow. °· Do not smoke. °· Solid foods and dairy products should be avoided until your illness improves. °· As you improve, slowly return to a regular diet with easily-digested foods first. Examples are: °¨ Bananas. °¨ Rice. °¨ Toast. °¨ Crackers. °You should only need these for about 2 days before adding more normal foods to your diet. °· Avoid spicy or fatty foods as well as caffeine and alcohol for several days. °· Medicine to control cramping and diarrhea can relieve symptoms but may prolong some cases of bloody diarrhea. Antibiotics can speed recovery from diarrhea due to some bacterial infections. Call your caregiver if diarrhea does not get better in 3 days. °SEEK MEDICAL CARE IF:  °· You do not improve after 3 days. °· Your diarrhea improves but your stool appears black. °SEEK IMMEDIATE MEDICAL CARE IF:  °· You become extremely weak or faint. °· You become very sweaty. °· You have increased pain or bleeding. °· You develop repeated vomiting. °· You vomit and you see blood or the vomit looks black in color. °· You have a fever. °Document Released: 10/19/2005 Document Revised: 01/11/2012 Document Reviewed: 09/20/2009 °ExitCare® Patient Information ©2015 [employer]. This information is  not intended to replace advice given to you by your health care provider. Make sure you discuss any questions you have with your health care provider. ° °

## 2014-09-01 NOTE — ED Notes (Addendum)
Pt states she had diarr starting yesterday; took immodium; this morning had dark stools and clot when she wiped. Called Drs. Office and then decided to come. Undergoing chemo with pancreatic cancer with mets to lung. Neutropenic precautions.

## 2014-09-01 NOTE — ED Provider Notes (Signed)
CSN: [id number]     Arrival date & time 09/01/14  1036 History   First MD Initiated Contact with Patient 09/01/14 1039     Chief Complaint  Patient presents with  . GI Problem     (Consider location/radiation/quality/duration/timing/severity/associated sxs/prior Treatment) HPI Comments: Patient with a history of Pancreatic Cancer with lung mets currently undergoing chemotherapy presents today with a chief complaint of hematochezia.  She reports that this morning she noticed dark red mixed in with her stool, which she thought was blood.  She reports that this was the only episode. She reports that she has had diarrhea for the past 2-3 days.  Last Chemotherapy was three days ago.   She denies melena, abdominal pain, fever, chills, nausea, vomiting, or urinary symptoms.  Denies dizziness or lightheadedness.  She is currently not on any anticoagulants.  She denies any prior history of GI bleed or Diverticulosis.  The history is provided by the patient.    Past Medical History  Diagnosis Date  . Hypertension   . UTI (urinary tract infection)   . History of stomach ulcers   . Diabetes mellitus without complication   . Complication of anesthesia     hx of n/v  . PONV (postoperative nausea and vomiting)   . Pancreatic cancer metastasized to lung 07/10/2014   Past Surgical History  Procedure Laterality Date  . Dental surgery    . Bunionectomy    . Ercp N/A 06/23/2014    Procedure: ENDOSCOPIC RETROGRADE CHOLANGIOPANCREATOGRAPHY (ERCP);  Surgeon: Ladene Artist, MD;  Location: [facility] ENDOSCOPY;  Service: Endoscopy;  Laterality: N/A;  . Eus N/A 06/29/2014    Procedure: UPPER ENDOSCOPIC ULTRASOUND (EUS) LINEAR;  Surgeon: Beryle Beams, MD;  Location: [facility] ENDOSCOPY;  Service: Endoscopy;  Laterality: N/A;  . Fine needle aspiration N/A 06/29/2014    Procedure: FINE NEEDLE ASPIRATION (FNA) LINEAR;  Surgeon: Beryle Beams, MD;  Location: [facility] ENDOSCOPY;  Service: Endoscopy;  Laterality: N/A;   Family  History  Problem Relation Age of Onset  . Heart attack Mother   . Stroke Mother   . Heart attack Father   . Hyperlipidemia Sister   . Hypertension Sister   . Hypertension Brother   . Alcohol abuse Brother    History  Substance Use Topics  . Smoking status: Former Smoker -- 1.00 packs/day for 4 years    Types: Cigarettes    Start date: 02/03/1963    Quit date: 03/05/1967  . Smokeless tobacco: Never Used     Comment: quit 45 years ago  . Alcohol Use: No   OB History   Grav Para Term Preterm Abortions TAB SAB Ect Mult Living                 Review of Systems  All other systems reviewed and are negative.     Allergies  Review of patient's allergies indicates no known allergies.  Home Medications   Prior to Admission medications   Medication Sig Start Date End Date Taking? Authorizing Provider  amLODipine (NORVASC) 10 MG tablet Take 10 mg by mouth daily.    Historical Provider, MD  Calcium Carbonate-Vitamin D (CALCIUM + D PO) Take 1 tablet by mouth daily.    Historical Provider, MD  cyproheptadine (PERIACTIN) 4 MG tablet Take 1 tablet (4 mg total) by mouth 2 (two) times daily. 08/01/14   [name], MD  Flaxseed, Linseed, (FLAX PO) Take 1 tablet by mouth daily.    Historical Provider, MD  glipiZIDE-metformin (  METAGLIP) 5-500 MG per tablet Take 1 tablet by mouth 2 (two) times daily before a meal. 08/30/14   [name], MD  lansoprazole (PREVACID) 15 MG capsule Take 15 mg by mouth as needed.     Historical Provider, MD  lidocaine-prilocaine (EMLA) cream Apply 1 application topically as needed. Apply quarter sized amount to portacath site at least one hour prior to chemo treatment 07/10/14   [name], MD  lipase/protease/amylase (CREON) 12000 UNITS CPEP capsule Take 12,000 Units by mouth every morning.    Historical Provider, MD  megestrol (MEGACE) 400 MG/10ML suspension Take 800 mg by mouth daily.    Historical Provider, MD  Multiple Vitamin (MULTIVITAMIN WITH  MINERALS) TABS tablet Take 1 tablet by mouth daily.    Historical Provider, MD  pyridOXINE (VITAMIN B-6) 100 MG tablet Take 100 mg by mouth daily.    Historical Provider, MD  vitamin A 8000 UNIT capsule Take 8,000 Units by mouth daily.    Historical Provider, MD  vitamin E 400 UNIT capsule Take 400 Units by mouth daily.    Historical Provider, MD   BP 135/71  Pulse 104  Temp(Src) 98.4 F (36.9 C) (Oral)  Resp 15  SpO2 100% Physical Exam  Nursing note and vitals reviewed. Constitutional: She appears well-developed and well-nourished.  HENT:  Head: Normocephalic and atraumatic.  Mouth/Throat: Oropharynx is clear and moist.  Neck: Normal range of motion. Neck supple.  Cardiovascular: Normal rate, regular rhythm and normal heart sounds.   Pulmonary/Chest: Effort normal and breath sounds normal.  Abdominal: Soft. Bowel sounds are normal. She exhibits no distension and no mass. There is no tenderness. There is no rebound and no guarding.  Stool appears yellowish in color with rectal exam  Genitourinary: Rectal exam shows no external hemorrhoid and no internal hemorrhoid. Guaiac negative stool.  Musculoskeletal: Normal range of motion.  Neurological: She is alert.  Skin: Skin is warm and dry.  Psychiatric: She has a normal mood and affect.    ED Course  Procedures (including critical care time) Labs Review Labs Reviewed - No data to display  Imaging Review No results found.   EKG Interpretation None      MDM   Final diagnoses:  None   Patient presenting today with a chief complaint of hematochezia.  She reports that she had one episode this morning where she noticed dark red blood in her stool.  No abdominal pain on exam.  Patient is afebrile.  No vomiting.  Hemoccult is negative today in the ED.  Hemoglobin stable.  Remainder of labs unremarkable.  Patient hemodynamically stable.  Patient stable for discharge.  She has been followed by Dr. Benson Norway with GI in the past.  Patient  instructed to follow up with Dr. Benson Norway.  Return precautions given.    Hyman Bible, PA-C 09/01/14 1344

## 2014-09-03 ENCOUNTER — Telehealth: Payer: Self-pay | Admitting: *Deleted

## 2014-09-03 ENCOUNTER — Other Ambulatory Visit: Payer: Self-pay | Admitting: *Deleted

## 2014-09-03 DIAGNOSIS — C259 Malignant neoplasm of pancreas, unspecified: Secondary | ICD-10-CM

## 2014-09-03 DIAGNOSIS — C7800 Secondary malignant neoplasm of unspecified lung: Principal | ICD-10-CM

## 2014-09-03 MED ORDER — DIPHENOXYLATE-ATROPINE 2.5-0.025 MG PO TABS: 1.0000 | ORAL_TABLET | ORAL | Status: DC | PRN

## 2014-09-03 NOTE — Telephone Encounter (Signed)
Patient called stating she had been experiencing diarrhea since Thursday. She went to the ED on Saturday. She was heme negative and was told to start imodium. She states the imodium has not worked. She also states that she has had some nosebleeds and a headache. She has not yet started the medication called in on Friday. Dr Marin Olp notified and orders given for patient to start on lomotil. Told patient to call back if symptoms don't resolve or get worse.

## 2014-09-10 ENCOUNTER — Ambulatory Visit (HOSPITAL_BASED_OUTPATIENT_CLINIC_OR_DEPARTMENT_OTHER)
Admission: RE | Admit: 2014-09-10 | Discharge: 2014-09-10 | Disposition: A | Discharge status: Home / Self Care | Payer: Medicare Other | Source: Ambulatory Visit | Attending: Diagnostic Radiology | Admitting: Diagnostic Radiology

## 2014-09-10 ENCOUNTER — Encounter (HOSPITAL_BASED_OUTPATIENT_CLINIC_OR_DEPARTMENT_OTHER): Payer: Self-pay

## 2014-09-10 ENCOUNTER — Ambulatory Visit (HOSPITAL_BASED_OUTPATIENT_CLINIC_OR_DEPARTMENT_OTHER)
Admission: RE | Admit: 2014-09-10 | Discharge: 2014-09-10 | Disposition: A | Discharge status: Home / Self Care | Payer: Medicare Other | Source: Ambulatory Visit | Attending: Family | Admitting: Family

## 2014-09-10 DIAGNOSIS — C259 Malignant neoplasm of pancreas, unspecified: Secondary | ICD-10-CM | POA: Diagnosis not present

## 2014-09-10 DIAGNOSIS — C7800 Secondary malignant neoplasm of unspecified lung: Secondary | ICD-10-CM | POA: Diagnosis not present

## 2014-09-10 DIAGNOSIS — M899 Disorder of bone, unspecified: Secondary | ICD-10-CM | POA: Insufficient documentation

## 2014-09-10 DIAGNOSIS — K769 Liver disease, unspecified: Secondary | ICD-10-CM | POA: Insufficient documentation

## 2014-09-10 DIAGNOSIS — R911 Solitary pulmonary nodule: Secondary | ICD-10-CM | POA: Diagnosis not present

## 2014-09-10 DIAGNOSIS — R599 Enlarged lymph nodes, unspecified: Secondary | ICD-10-CM | POA: Diagnosis not present

## 2014-09-10 DIAGNOSIS — K668 Other specified disorders of peritoneum: Secondary | ICD-10-CM | POA: Diagnosis not present

## 2014-09-10 MED ORDER — IOHEXOL 300 MG/ML  SOLN
100.0000 mL | Freq: Once | INTRAMUSCULAR | Status: AC | PRN
  Administered 2014-09-10: 100 mL via INTRAVENOUS

## 2014-09-11 ENCOUNTER — Other Ambulatory Visit: Payer: Self-pay | Admitting: *Deleted

## 2014-09-11 DIAGNOSIS — C259 Malignant neoplasm of pancreas, unspecified: Secondary | ICD-10-CM

## 2014-09-11 DIAGNOSIS — C7800 Secondary malignant neoplasm of unspecified lung: Principal | ICD-10-CM

## 2014-09-12 ENCOUNTER — Encounter: Payer: Self-pay | Admitting: Hematology & Oncology

## 2014-09-12 ENCOUNTER — Ambulatory Visit (HOSPITAL_BASED_OUTPATIENT_CLINIC_OR_DEPARTMENT_OTHER): Payer: Medicare Other | Admitting: Hematology & Oncology

## 2014-09-12 ENCOUNTER — Other Ambulatory Visit (HOSPITAL_BASED_OUTPATIENT_CLINIC_OR_DEPARTMENT_OTHER): Payer: Medicare Other | Admitting: Lab

## 2014-09-12 ENCOUNTER — Ambulatory Visit (HOSPITAL_BASED_OUTPATIENT_CLINIC_OR_DEPARTMENT_OTHER): Payer: Medicare Other

## 2014-09-12 VITALS — BP 184/99 | HR 96 | Temp 98.5°F | Resp 14 | Ht 60.0 in | Wt 133.0 lb

## 2014-09-12 DIAGNOSIS — C250 Malignant neoplasm of head of pancreas: Secondary | ICD-10-CM

## 2014-09-12 DIAGNOSIS — K8689 Other specified diseases of pancreas: Secondary | ICD-10-CM

## 2014-09-12 DIAGNOSIS — C259 Malignant neoplasm of pancreas, unspecified: Secondary | ICD-10-CM

## 2014-09-12 DIAGNOSIS — Z5111 Encounter for antineoplastic chemotherapy: Secondary | ICD-10-CM

## 2014-09-12 DIAGNOSIS — C7800 Secondary malignant neoplasm of unspecified lung: Secondary | ICD-10-CM

## 2014-09-12 DIAGNOSIS — G8929 Other chronic pain: Secondary | ICD-10-CM

## 2014-09-12 DIAGNOSIS — Z452 Encounter for adjustment and management of vascular access device: Secondary | ICD-10-CM

## 2014-09-12 DIAGNOSIS — M549 Dorsalgia, unspecified: Secondary | ICD-10-CM

## 2014-09-12 LAB — CBC WITH DIFFERENTIAL (CANCER CENTER ONLY)
BASO#: 0.1 10*3/uL (ref 0.0–0.2)
BASO%: 0.4 % (ref 0.0–2.0)
EOS%: 1.7 % (ref 0.0–7.0)
Eosinophils Absolute: 0.2 10*3/uL (ref 0.0–0.5)
HCT: 31.2 % — ABNORMAL LOW (ref 34.8–46.6)
HGB: 10.4 g/dL — ABNORMAL LOW (ref 11.6–15.9)
LYMPH#: 3.7 10*3/uL — ABNORMAL HIGH (ref 0.9–3.3)
LYMPH%: 25.8 % (ref 14.0–48.0)
MCH: 24.5 pg — ABNORMAL LOW (ref 26.0–34.0)
MCHC: 33.3 g/dL (ref 32.0–36.0)
MCV: 74 fL — AB (ref 81–101)
MONO#: 1.8 10*3/uL — AB (ref 0.1–0.9)
MONO%: 12.8 % (ref 0.0–13.0)
NEUT%: 59.3 % (ref 39.6–80.0)
NEUTROS ABS: 8.4 10*3/uL — AB (ref 1.5–6.5)
RBC: 4.24 10*6/uL (ref 3.70–5.32)
RDW: 18 % — AB (ref 11.1–15.7)
WBC: 14.1 10*3/uL — AB (ref 3.9–10.0)

## 2014-09-12 LAB — CMP (CANCER CENTER ONLY)
ALT: 35 U/L (ref 10–47)
AST: 24 U/L (ref 11–38)
Albumin: 3 g/dL — ABNORMAL LOW (ref 3.3–5.5)
Alkaline Phosphatase: 108 U/L — ABNORMAL HIGH (ref 26–84)
BUN, Bld: 11 mg/dL (ref 7–22)
CO2: 22 mEq/L (ref 18–33)
Calcium: 9.6 mg/dL (ref 8.0–10.3)
Chloride: 106 mEq/L (ref 98–108)
Creat: 0.6 mg/dl (ref 0.6–1.2)
Glucose, Bld: 202 mg/dL — ABNORMAL HIGH (ref 73–118)
Potassium: 3.3 mEq/L (ref 3.3–4.7)
SODIUM: 141 meq/L (ref 128–145)
TOTAL PROTEIN: 7.1 g/dL (ref 6.4–8.1)
Total Bilirubin: 0.4 mg/dl (ref 0.20–1.60)

## 2014-09-12 MED ORDER — DEXAMETHASONE SODIUM PHOSPHATE 10 MG/ML IJ SOLN
10.0000 mg | Freq: Once | INTRAMUSCULAR | Status: AC
  Administered 2014-09-12: 10 mg via INTRAVENOUS

## 2014-09-12 MED ORDER — SODIUM CHLORIDE 0.9 % IJ SOLN
10.0000 mL | INTRAMUSCULAR | Status: DC | PRN
Start: 2014-09-12 — End: 2014-09-12
  Administered 2014-09-12: 10 mL
  Filled 2014-09-12: qty 10

## 2014-09-12 MED ORDER — GEMCITABINE HCL CHEMO INJECTION 1 GM/26.3ML
1600.0000 mg | Freq: Once | INTRAVENOUS | Status: AC
  Administered 2014-09-12: 1600 mg via INTRAVENOUS
  Filled 2014-09-12: qty 42.08

## 2014-09-12 MED ORDER — SODIUM CHLORIDE 0.9 % IV SOLN: Freq: Once | INTRAVENOUS | Status: DC

## 2014-09-12 MED ORDER — HEPARIN SOD (PORK) LOCK FLUSH 100 UNIT/ML IV SOLN
500.0000 [IU] | Freq: Once | INTRAVENOUS | Status: AC | PRN
  Administered 2014-09-12: 500 [IU]
  Filled 2014-09-12: qty 5

## 2014-09-12 MED ORDER — PACLITAXEL PROTEIN-BOUND CHEMO INJECTION 100 MG
125.0000 mg/m2 | Freq: Once | INTRAVENOUS | Status: AC
  Administered 2014-09-12: 200 mg via INTRAVENOUS
  Filled 2014-09-12: qty 40

## 2014-09-12 MED ORDER — ZOLEDRONIC ACID 4 MG/100ML IV SOLN
4.0000 mg | Freq: Once | INTRAVENOUS | Status: AC
  Administered 2014-09-12: 4 mg via INTRAVENOUS
  Filled 2014-09-12: qty 100

## 2014-09-12 MED ORDER — ALTEPLASE 2 MG IJ SOLR
INTRAMUSCULAR | Status: AC
  Filled 2014-09-12: qty 2

## 2014-09-12 MED ORDER — SODIUM CHLORIDE 0.9 % IV SOLN
Freq: Once | INTRAVENOUS | Status: AC
  Administered 2014-09-12: 14:00:00 via INTRAVENOUS

## 2014-09-12 MED ORDER — FLUCONAZOLE 100 MG PO TABS
200.0000 mg | ORAL_TABLET | Freq: Once | ORAL | Status: AC
  Administered 2014-09-12: 200 mg via ORAL
  Filled 2014-09-12: qty 2

## 2014-09-12 MED ORDER — ALTEPLASE 2 MG IJ SOLR
2.0000 mg | Freq: Once | INTRAMUSCULAR | Status: AC | PRN
  Administered 2014-09-12: 2 mg
  Filled 2014-09-12: qty 2

## 2014-09-12 MED ORDER — DEXAMETHASONE SODIUM PHOSPHATE 10 MG/ML IJ SOLN
INTRAMUSCULAR | Status: AC
  Filled 2014-09-12: qty 1

## 2014-09-12 MED ORDER — ONDANSETRON 8 MG/50ML IVPB (CHCC)
8.0000 mg | Freq: Once | INTRAVENOUS | Status: AC
  Administered 2014-09-12: 8 mg via INTRAVENOUS

## 2014-09-12 NOTE — Patient Instructions (Signed)
Gemcitabine injection What is this medicine? GEMCITABINE (jem SIT a been) is a chemotherapy drug. This medicine is used to treat many types of cancer like breast cancer, lung cancer, pancreatic cancer, and ovarian cancer. This medicine may be used for other purposes; ask your health care provider or pharmacist if you have questions. COMMON BRAND NAME(S): Gemzar What should I tell my health care provider before I take this medicine? They need to know if you have any of these conditions: -blood disorders -infection -kidney disease -liver disease -recent or ongoing radiation therapy -an unusual or allergic reaction to gemcitabine, other chemotherapy, other medicines, foods, dyes, or preservatives -pregnant or trying to get pregnant -breast-feeding How should I use this medicine? This drug is given as an infusion into a vein. It is administered in a hospital or clinic by a specially trained health care professional. Talk to your pediatrician regarding the use of this medicine in children. Special care may be needed. Overdosage: If you think you have taken too much of this medicine contact a poison control center or emergency room at once. NOTE: This medicine is only for you. Do not share this medicine with others. What if I miss a dose? It is important not to miss your dose. Call your doctor or health care professional if you are unable to keep an appointment. What may interact with this medicine? -medicines to increase blood counts like filgrastim, pegfilgrastim, sargramostim -some other chemotherapy drugs like cisplatin -vaccines Talk to your doctor or health care professional before taking any of these medicines: -acetaminophen -aspirin -ibuprofen -ketoprofen -naproxen This list may not describe all possible interactions. Give your health care provider a list of all the medicines, herbs, non-prescription drugs, or dietary supplements you use. Also tell them if you smoke, drink alcohol,  or use illegal drugs. Some items may interact with your medicine. What should I watch for while using this medicine? Visit your doctor for checks on your progress. This drug may make you feel generally unwell. This is not uncommon, as chemotherapy can affect healthy cells as well as cancer cells. Report any side effects. Continue your course of treatment even though you feel ill unless your doctor tells you to stop. In some cases, you may be given additional medicines to help with side effects. Follow all directions for their use. Call your doctor or health care professional for advice if you get a fever, chills or sore throat, or other symptoms of a cold or flu. Do not treat yourself. This drug decreases your body's ability to fight infections. Try to avoid being around people who are sick. This medicine may increase your risk to bruise or bleed. Call your doctor or health care professional if you notice any unusual bleeding. Be careful brushing and flossing your teeth or using a toothpick because you may get an infection or bleed more easily. If you have any dental work done, tell your dentist you are receiving this medicine. Avoid taking products that contain aspirin, acetaminophen, ibuprofen, naproxen, or ketoprofen unless instructed by your doctor. These medicines may hide a fever. Women should inform their doctor if they wish to become pregnant or think they might be pregnant. There is a potential for serious side effects to an unborn child. Talk to your health care professional or pharmacist for more information. Do not breast-feed an infant while taking this medicine. What side effects may I notice from receiving this medicine? Side effects that you should report to your doctor or health care professional as   soon as possible: -allergic reactions like skin rash, itching or hives, swelling of the face, lips, or tongue -low blood counts - this medicine may decrease the number of white blood cells,  red blood cells and platelets. You may be at increased risk for infections and bleeding. -signs of infection - fever or chills, cough, sore throat, pain or difficulty passing urine -signs of decreased platelets or bleeding - bruising, pinpoint red spots on the skin, black, tarry stools, blood in the urine -signs of decreased red blood cells - unusually weak or tired, fainting spells, lightheadedness -breathing problems -chest pain -mouth sores -nausea and vomiting -pain, swelling, redness at site where injected -pain, tingling, numbness in the hands or feet -stomach pain -swelling of ankles, feet, hands -unusual bleeding Side effects that usually do not require medical attention (report to your doctor or health care professional if they continue or are bothersome): -constipation -diarrhea -hair loss -loss of appetite -stomach upset This list may not describe all possible side effects. Call your doctor for medical advice about side effects. You may report side effects to FDA at 1-800-FDA-1088. Where should I keep my medicine? This drug is given in a hospital or clinic and will not be stored at home. NOTE: This sheet is a summary. It may not cover all possible information. If you have questions about this medicine, talk to your doctor, pharmacist, or health care provider.  2015, Elsevier/Gold Standard. (2008-02-28 18:45:54)   Nanoparticle Albumin-Bound Paclitaxel injection What is this medicine? NANOPARTICLE ALBUMIN-BOUND PACLITAXEL (Na no PAHR ti kuhl al BYOO muhn-bound PAK li TAX el) is a chemotherapy drug. It targets fast dividing cells, like cancer cells, and causes these cells to die. This medicine is used to treat advanced breast cancer and advanced lung cancer. This medicine may be used for other purposes; ask your health care provider or pharmacist if you have questions. COMMON BRAND NAME(S): Abraxane What should I tell my health care provider before I take this medicine? They  need to know if you have any of these conditions: -kidney disease -liver disease -low blood counts, like low platelets, red blood cells, or white blood cells -recent or ongoing radiation therapy -an unusual or allergic reaction to paclitaxel, albumin, other chemotherapy, other medicines, foods, dyes, or preservatives -pregnant or trying to get pregnant -breast-feeding How should I use this medicine? This drug is given as an infusion into a vein. It is administered in a hospital or clinic by a specially trained health care professional. Talk to your pediatrician regarding the use of this medicine in children. Special care may be needed. Overdosage: If you think you have taken too much of this medicine contact a poison control center or emergency room at once. NOTE: This medicine is only for you. Do not share this medicine with others. What if I miss a dose? It is important not to miss your dose. Call your doctor or health care professional if you are unable to keep an appointment. What may interact with this medicine? -cyclosporine -diazepam -ketoconazole -medicines to increase blood counts like filgrastim, pegfilgrastim, sargramostim -other chemotherapy drugs like cisplatin, doxorubicin, epirubicin, etoposide, teniposide, vincristine -quinidine -testosterone -vaccines -verapamil Talk to your doctor or health care professional before taking any of these medicines: -acetaminophen -aspirin -ibuprofen -ketoprofen -naproxen This list may not describe all possible interactions. Give your health care provider a list of all the medicines, herbs, non-prescription drugs, or dietary supplements you use. Also tell them if you smoke, drink alcohol, or use illegal drugs. Some   items may interact with your medicine. What should I watch for while using this medicine? Your condition will be monitored carefully while you are receiving this medicine. You will need important blood work done while you are  taking this medicine. This drug may make you feel generally unwell. This is not uncommon, as chemotherapy can affect healthy cells as well as cancer cells. Report any side effects. Continue your course of treatment even though you feel ill unless your doctor tells you to stop. In some cases, you may be given additional medicines to help with side effects. Follow all directions for their use. Call your doctor or health care professional for advice if you get a fever, chills or sore throat, or other symptoms of a cold or flu. Do not treat yourself. This drug decreases your body's ability to fight infections. Try to avoid being around people who are sick. This medicine may increase your risk to bruise or bleed. Call your doctor or health care professional if you notice any unusual bleeding. Be careful brushing and flossing your teeth or using a toothpick because you may get an infection or bleed more easily. If you have any dental work done, tell your dentist you are receiving this medicine. Avoid taking products that contain aspirin, acetaminophen, ibuprofen, naproxen, or ketoprofen unless instructed by your doctor. These medicines may hide a fever. Do not become pregnant while taking this medicine. Women should inform their doctor if they wish to become pregnant or think they might be pregnant. There is a potential for serious side effects to an unborn child. Talk to your health care professional or pharmacist for more information. Do not breast-feed an infant while taking this medicine. Men are advised not to father a child while receiving this medicine. What side effects may I notice from receiving this medicine? Side effects that you should report to your doctor or health care professional as soon as possible: -allergic reactions like skin rash, itching or hives, swelling of the face, lips, or tongue -low blood counts - This drug may decrease the number of white blood cells, red blood cells and  platelets. You may be at increased risk for infections and bleeding. -signs of infection - fever or chills, cough, sore throat, pain or difficulty passing urine -signs of decreased platelets or bleeding - bruising, pinpoint red spots on the skin, black, tarry stools, nosebleeds -signs of decreased red blood cells - unusually weak or tired, fainting spells, lightheadedness -breathing problems -changes in vision -chest pain -high or low blood pressure -mouth sores Implanted Port Insertion, Care After Refer to this sheet in the next few weeks. These instructions provide you with information on caring for yourself after your procedure. Your health care provider may also give you more specific instructions. Your treatment has been planned according to current medical practices, but problems sometimes occur. Call your health care provider if you have any problems or questions after your procedure. WHAT TO EXPECT AFTER THE PROCEDURE After your procedure, it is typical to have the following:   Discomfort at the port insertion site. Ice packs to the area will help.  Bruising on the skin over the port. This will subside in 3-4 days. HOME CARE INSTRUCTIONS  After your port is placed, you will get a manufacturer's information card. The card has information about your port. Keep this card with you at all times.   Know what kind of port you have. There are many types of ports available.   Wear a medical   alert bracelet in case of an emergency. This can help alert health care workers that you have a port.   The port can stay in for as long as your health care provider believes it is necessary.   A home health care nurse may give medicines and take care of the port.   You or a family member can get special training and directions for giving medicine and taking care of the port at home.  SEEK MEDICAL CARE IF:   Your port does not flush or you are unable to get a blood return.   You have a  fever or chills. SEEK IMMEDIATE MEDICAL CARE IF:  You have new fluid or pus coming from your incision.   You notice a bad smell coming from your incision site.   You have swelling, pain, or more redness at the incision or port site.   You have chest pain or shortness of breath. Document Released: 08/09/2013 Document Revised: 10/24/2013 Document Reviewed: 08/09/2013 ExitCare Patient Information 2015 [employer]. This information is not intended to replace advice given to you by your health care provider. Make sure you discuss any questions you have with your health care provider.  

## 2014-09-12 NOTE — Progress Notes (Signed)
Hematology and Oncology Follow Up Visit  Jacqueline Hahn [id number] [birth year] 68 y.o. 09/12/2014   Principle Diagnosis:   Metastatic adenocarcinoma of the pancreas  Current Therapy:    Status post 2 cycles of gemcitabine/Abraxane  Zometa 4 mg IV every month       Interim History:  Jacqueline Hahn is back for follow-up. She had a CT scan done 2 days ago. Thank you, the CT scan showed that she is responding. The pulmonary nodules have resolved. Her pancreatic mass is now 1.4 x 1.5 cm. She has no pathologic adenopathy. A peri-pancreatic lymph node measures 5 mm. Previously it measured 7 mm. A jejunal node measures 7 mm which previously measured 10 mm. They do comment on a sclerotic lesion at T10.   she had a tough time with chemotherapy. She had a lot of diarrhea. She is on some pancreatic enzyme replacement. She had no bleeding with the diarrhea. She had some nausea but no vomiting with the diarrhea. There is really no abdominal pain.  Her blood sugars have gone up. She is on some metformin and glipizide. She has held for the past couple days because of the CT scan.    She's had no cough. There's been no shortness of breath. She's had no rashes. There's been no leg swelling. Back she really is not too "keen" on the face of the Megace. I told her we can try the Megace ES.  She is having issues with pain.  Overall, her performance status is ECOG 1   Medications: Current outpatient prescriptions: amLODipine (NORVASC) 10 MG tablet, Take 10 mg by mouth daily., Disp: , Rfl: ;  Calcium Carbonate-Vitamin D (CALCIUM + D PO), Take 1 tablet by mouth daily., Disp: , Rfl: ;  Flaxseed, Linseed, (FLAX PO), Take 1 tablet by mouth daily., Disp: , Rfl: ;  glipiZIDE-metformin (METAGLIP) 5-500 MG per tablet, 1 tablet 2 (two) times daily before a meal. , Disp: , Rfl: 0 lansoprazole (PREVACID) 15 MG capsule, Take 15 mg by mouth daily as needed (for heartburn). , Disp: , Rfl: ;  lidocaine-prilocaine (EMLA) cream,  Apply 1 application topically as needed. Apply quarter sized amount to portacath site at least one hour prior to chemo treatment, Disp: 30 g, Rfl: 0;  lipase/protease/amylase (CREON) 12000 UNITS CPEP capsule, Take 12,000 Units by mouth daily as needed (for upset stomach). , Disp: , Rfl:  megestrol (MEGACE) 400 MG/10ML suspension, Take 800 mg by mouth daily., Disp: , Rfl: ;  Multiple Vitamin (MULTIVITAMIN WITH MINERALS) TABS tablet, Take 1 tablet by mouth daily., Disp: , Rfl: ;  pyridOXINE (VITAMIN B-6) 100 MG tablet, Take 100 mg by mouth daily., Disp: , Rfl: ;  vitamin A 8000 UNIT capsule, Take 8,000 Units by mouth daily., Disp: , Rfl: ;  vitamin E 400 UNIT capsule, Take 400 Units by mouth daily., Disp: , Rfl:  diphenoxylate-atropine (LOMOTIL) 2.5-0.025 MG per tablet, Take 1 tablet by mouth every 4 (four) hours as needed for diarrhea or loose stools (max 6 pills per day)., Disp: 30 tablet, Rfl: 0  Allergies: No Known Allergies  Past Medical History, Surgical history, Social history, and Family History were reviewed and updated.  Review of Systems: As above  Physical Exam:  height is 5' (1.524 m) and weight is 133 lb (60.328 kg). Her oral temperature is 98.5 F (36.9 C). Her blood pressure is 184/99 and her pulse is 96. Her respiration is 14.   Well-developed and well-nourished African-American female in no obvious distress. Her  head and neck exam shows no ocular or oral lesions. There are no palpable cervical or supraclavicular lymph nodes. Lungs are clear. Cardiac exam regular rate and rhythm with a normal S1 and S2. She has no murmurs, rubs or bruits. Abdomen is soft. She good bowel sounds. There is no fluid wave. There is no palpable liver or spleen tip. Back exam shows no tenderness over the spine, ribs or hips. Extremities shows no clubbing, cyanosis or edema. She has good range of motion of her joints. She has good strength in her extremities. Skin exam shows no rashes, ecchymoses or petechia.  Neurological exam is nonfocal.  Lab Results  Component Value Date   WBC 14.1* 09/12/2014   HGB 10.4* 09/12/2014   HCT 31.2* 09/12/2014   MCV 74* 09/12/2014   PLT 760 Large platelets present* 09/12/2014     Chemistry      Component Value Date/Time   NA 141 09/12/2014 1106   NA 136* 09/01/2014 1148   K 3.3 09/12/2014 1106   K 3.5* 09/01/2014 1148   CL 106 09/12/2014 1106   CL 102 09/01/2014 1148   CO2 22 09/12/2014 1106   CO2 19 09/01/2014 1148   BUN 11 09/12/2014 1106   BUN 11 09/01/2014 1148   CREATININE 0.6 09/12/2014 1106   CREATININE 0.64 09/01/2014 1148      Component Value Date/Time   CALCIUM 9.6 09/12/2014 1106   CALCIUM 9.3 09/01/2014 1148   ALKPHOS 108* 09/12/2014 1106   ALKPHOS 121* 09/01/2014 1148   AST 24 09/12/2014 1106   AST 48* 09/01/2014 1148   ALT 35 09/12/2014 1106   ALT 85* 09/01/2014 1148   BILITOT 0.40 09/12/2014 1106   BILITOT 0.6 09/01/2014 1148         Impression and Plan: Jacqueline Hahn is 68 year old African-American female with metastatic pancreatic cancer. She is on Gemzar/Abraxane. She is responding. Her quality of life is actually quite good.  Am not sure why she has such difficulty with chemotherapy last time. It was the third treatment in the cycle. Is possible that this may been the reason. I suppose, that we could always decrease the dose for the third cycle. This might make a difference.  I'm just glad that she responded to treatment.  We will go ahead and start her on her third cycle of treatment.  She still is thing about going back to [place] where her family lives. I told her that if she felt that she had to do this, we could certainly help her out and find a good oncologist out in [place].  We will plan to get her back to see [place] in another month.   [name], MD 11/11/201512:51 PM

## 2014-09-19 ENCOUNTER — Other Ambulatory Visit: Payer: Self-pay | Admitting: *Deleted

## 2014-09-19 ENCOUNTER — Ambulatory Visit (HOSPITAL_BASED_OUTPATIENT_CLINIC_OR_DEPARTMENT_OTHER): Payer: Medicare Other

## 2014-09-19 ENCOUNTER — Other Ambulatory Visit (HOSPITAL_BASED_OUTPATIENT_CLINIC_OR_DEPARTMENT_OTHER): Payer: Medicare Other | Admitting: Lab

## 2014-09-19 DIAGNOSIS — C7800 Secondary malignant neoplasm of unspecified lung: Secondary | ICD-10-CM

## 2014-09-19 DIAGNOSIS — C259 Malignant neoplasm of pancreas, unspecified: Secondary | ICD-10-CM

## 2014-09-19 DIAGNOSIS — C250 Malignant neoplasm of head of pancreas: Secondary | ICD-10-CM

## 2014-09-19 DIAGNOSIS — K8689 Other specified diseases of pancreas: Secondary | ICD-10-CM

## 2014-09-19 DIAGNOSIS — Z5111 Encounter for antineoplastic chemotherapy: Secondary | ICD-10-CM

## 2014-09-19 LAB — CBC WITH DIFFERENTIAL (CANCER CENTER ONLY)
BASO#: 0 10*3/uL (ref 0.0–0.2)
BASO%: 0.3 % (ref 0.0–2.0)
EOS ABS: 0.1 10*3/uL (ref 0.0–0.5)
EOS%: 1.2 % (ref 0.0–7.0)
HCT: 30.7 % — ABNORMAL LOW (ref 34.8–46.6)
HGB: 10.1 g/dL — ABNORMAL LOW (ref 11.6–15.9)
LYMPH#: 3.8 10*3/uL — ABNORMAL HIGH (ref 0.9–3.3)
LYMPH%: 34.2 % (ref 14.0–48.0)
MCH: 24.3 pg — AB (ref 26.0–34.0)
MCHC: 32.9 g/dL (ref 32.0–36.0)
MCV: 74 fL — AB (ref 81–101)
MONO#: 1.4 10*3/uL — AB (ref 0.1–0.9)
MONO%: 12.7 % (ref 0.0–13.0)
NEUT%: 51.6 % (ref 39.6–80.0)
NEUTROS ABS: 5.8 10*3/uL (ref 1.5–6.5)
PLATELETS: 516 10*3/uL — AB (ref 145–400)
RBC: 4.15 10*6/uL (ref 3.70–5.32)
RDW: 18 % — AB (ref 11.1–15.7)
WBC: 11.1 10*3/uL — AB (ref 3.9–10.0)

## 2014-09-19 LAB — CMP (CANCER CENTER ONLY)
ALBUMIN: 3 g/dL — AB (ref 3.3–5.5)
ALT(SGPT): 44 U/L (ref 10–47)
AST: 29 U/L (ref 11–38)
Alkaline Phosphatase: 99 U/L — ABNORMAL HIGH (ref 26–84)
BUN, Bld: 12 mg/dL (ref 7–22)
CO2: 20 mEq/L (ref 18–33)
Calcium: 8.5 mg/dL (ref 8.0–10.3)
Chloride: 108 mEq/L (ref 98–108)
Creat: 0.7 mg/dl (ref 0.6–1.2)
GLUCOSE: 179 mg/dL — AB (ref 73–118)
POTASSIUM: 3.8 meq/L (ref 3.3–4.7)
SODIUM: 140 meq/L (ref 128–145)
TOTAL PROTEIN: 7.6 g/dL (ref 6.4–8.1)
Total Bilirubin: 0.4 mg/dl (ref 0.20–1.60)

## 2014-09-19 LAB — TECHNOLOGIST REVIEW CHCC SATELLITE

## 2014-09-19 MED ORDER — PACLITAXEL PROTEIN-BOUND CHEMO INJECTION 100 MG
125.0000 mg/m2 | Freq: Once | INTRAVENOUS | Status: AC
  Administered 2014-09-19: 200 mg via INTRAVENOUS
  Filled 2014-09-19: qty 40

## 2014-09-19 MED ORDER — DEXAMETHASONE SODIUM PHOSPHATE 10 MG/ML IJ SOLN
INTRAMUSCULAR | Status: AC
  Filled 2014-09-19: qty 1

## 2014-09-19 MED ORDER — ONDANSETRON 8 MG/50ML IVPB (CHCC)
8.0000 mg | Freq: Once | INTRAVENOUS | Status: AC
  Administered 2014-09-19: 8 mg via INTRAVENOUS

## 2014-09-19 MED ORDER — SODIUM CHLORIDE 0.9 % IJ SOLN
10.0000 mL | INTRAMUSCULAR | Status: DC | PRN
  Administered 2014-09-19: 10 mL
  Filled 2014-09-19: qty 10

## 2014-09-19 MED ORDER — GEMCITABINE HCL CHEMO INJECTION 1 GM/26.3ML
1600.0000 mg | Freq: Once | INTRAVENOUS | Status: AC
  Administered 2014-09-19: 1600 mg via INTRAVENOUS
  Filled 2014-09-19: qty 42.08

## 2014-09-19 MED ORDER — DRONABINOL 5 MG PO CAPS: 5.0000 mg | ORAL_CAPSULE | Freq: Two times a day (BID) | ORAL | Status: DC

## 2014-09-19 MED ORDER — DEXAMETHASONE SODIUM PHOSPHATE 10 MG/ML IJ SOLN
10.0000 mg | Freq: Once | INTRAMUSCULAR | Status: AC
  Administered 2014-09-19: 10 mg via INTRAVENOUS

## 2014-09-19 MED ORDER — SODIUM CHLORIDE 0.9 % IV SOLN
Freq: Once | INTRAVENOUS | Status: AC
  Administered 2014-09-19: 12:00:00 via INTRAVENOUS

## 2014-09-19 MED ORDER — HEPARIN SOD (PORK) LOCK FLUSH 100 UNIT/ML IV SOLN
500.0000 [IU] | Freq: Once | INTRAVENOUS | Status: AC | PRN
  Administered 2014-09-19: 500 [IU]
  Filled 2014-09-19: qty 5

## 2014-09-19 NOTE — Telephone Encounter (Signed)
Marinol is not covered by patient insurance. Called patient and let her know. She will continue to try the megace and let us know if there are any further issues.

## 2014-09-19 NOTE — Patient Instructions (Signed)
Gemcitabine injection What is this medicine? GEMCITABINE (jem SIT a been) is a chemotherapy drug. This medicine is used to treat many types of cancer like breast cancer, lung cancer, pancreatic cancer, and ovarian cancer. This medicine may be used for other purposes; ask your health care provider or pharmacist if you have questions. COMMON BRAND NAME(S): Gemzar What should I tell my health care provider before I take this medicine? They need to know if you have any of these conditions: -blood disorders -infection -kidney disease -liver disease -recent or ongoing radiation therapy -an unusual or allergic reaction to gemcitabine, other chemotherapy, other medicines, foods, dyes, or preservatives -pregnant or trying to get pregnant -breast-feeding How should I use this medicine? This drug is given as an infusion into a vein. It is administered in a hospital or clinic by a specially trained health care professional. Talk to your pediatrician regarding the use of this medicine in children. Special care may be needed. Overdosage: If you think you have taken too much of this medicine contact a poison control center or emergency room at once. NOTE: This medicine is only for you. Do not share this medicine with others. What if I miss a dose? It is important not to miss your dose. Call your doctor or health care professional if you are unable to keep an appointment. What may interact with this medicine? -medicines to increase blood counts like filgrastim, pegfilgrastim, sargramostim -some other chemotherapy drugs like cisplatin -vaccines Talk to your doctor or health care professional before taking any of these medicines: -acetaminophen -aspirin -ibuprofen -ketoprofen -naproxen This list may not describe all possible interactions. Give your health care provider a list of all the medicines, herbs, non-prescription drugs, or dietary supplements you use. Also tell them if you smoke, drink alcohol,  or use illegal drugs. Some items may interact with your medicine. What should I watch for while using this medicine? Visit your doctor for checks on your progress. This drug may make you feel generally unwell. This is not uncommon, as chemotherapy can affect healthy cells as well as cancer cells. Report any side effects. Continue your course of treatment even though you feel ill unless your doctor tells you to stop. In some cases, you may be given additional medicines to help with side effects. Follow all directions for their use. Call your doctor or health care professional for advice if you get a fever, chills or sore throat, or other symptoms of a cold or flu. Do not treat yourself. This drug decreases your body's ability to fight infections. Try to avoid being around people who are sick. This medicine may increase your risk to bruise or bleed. Call your doctor or health care professional if you notice any unusual bleeding. Be careful brushing and flossing your teeth or using a toothpick because you may get an infection or bleed more easily. If you have any dental work done, tell your dentist you are receiving this medicine. Avoid taking products that contain aspirin, acetaminophen, ibuprofen, naproxen, or ketoprofen unless instructed by your doctor. These medicines may hide a fever. Women should inform their doctor if they wish to become pregnant or think they might be pregnant. There is a potential for serious side effects to an unborn child. Talk to your health care professional or pharmacist for more information. Do not breast-feed an infant while taking this medicine. What side effects may I notice from receiving this medicine? Side effects that you should report to your doctor or health care professional as   soon as possible: -allergic reactions like skin rash, itching or hives, swelling of the face, lips, or tongue -low blood counts - this medicine may decrease the number of white blood cells,  red blood cells and platelets. You may be at increased risk for infections and bleeding. -signs of infection - fever or chills, cough, sore throat, pain or difficulty passing urine -signs of decreased platelets or bleeding - bruising, pinpoint red spots on the skin, black, tarry stools, blood in the urine -signs of decreased red blood cells - unusually weak or tired, fainting spells, lightheadedness -breathing problems -chest pain -mouth sores -nausea and vomiting -pain, swelling, redness at site where injected -pain, tingling, numbness in the hands or feet -stomach pain -swelling of ankles, feet, hands -unusual bleeding Side effects that usually do not require medical attention (report to your doctor or health care professional if they continue or are bothersome): -constipation -diarrhea -hair loss -loss of appetite -stomach upset This list may not describe all possible side effects. Call your doctor for medical advice about side effects. You may report side effects to FDA at 1-800-FDA-1088. Where should I keep my medicine? This drug is given in a hospital or clinic and will not be stored at home. NOTE: This sheet is a summary. It may not cover all possible information. If you have questions about this medicine, talk to your doctor, pharmacist, or health care provider.  2015, Elsevier/Gold Standard. (2008-02-28 18:45:54)   Nanoparticle Albumin-Bound Paclitaxel injection What is this medicine? NANOPARTICLE ALBUMIN-BOUND PACLITAXEL (Na no PAHR ti kuhl al BYOO muhn-bound PAK li TAX el) is a chemotherapy drug. It targets fast dividing cells, like cancer cells, and causes these cells to die. This medicine is used to treat advanced breast cancer and advanced lung cancer. This medicine may be used for other purposes; ask your health care provider or pharmacist if you have questions. COMMON BRAND NAME(S): Abraxane What should I tell my health care provider before I take this medicine? They  need to know if you have any of these conditions: -kidney disease -liver disease -low blood counts, like low platelets, red blood cells, or white blood cells -recent or ongoing radiation therapy -an unusual or allergic reaction to paclitaxel, albumin, other chemotherapy, other medicines, foods, dyes, or preservatives -pregnant or trying to get pregnant -breast-feeding How should I use this medicine? This drug is given as an infusion into a vein. It is administered in a hospital or clinic by a specially trained health care professional. Talk to your pediatrician regarding the use of this medicine in children. Special care may be needed. Overdosage: If you think you have taken too much of this medicine contact a poison control center or emergency room at once. NOTE: This medicine is only for you. Do not share this medicine with others. What if I miss a dose? It is important not to miss your dose. Call your doctor or health care professional if you are unable to keep an appointment. What may interact with this medicine? -cyclosporine -diazepam -ketoconazole -medicines to increase blood counts like filgrastim, pegfilgrastim, sargramostim -other chemotherapy drugs like cisplatin, doxorubicin, epirubicin, etoposide, teniposide, vincristine -quinidine -testosterone -vaccines -verapamil Talk to your doctor or health care professional before taking any of these medicines: -acetaminophen -aspirin -ibuprofen -ketoprofen -naproxen This list may not describe all possible interactions. Give your health care provider a list of all the medicines, herbs, non-prescription drugs, or dietary supplements you use. Also tell them if you smoke, drink alcohol, or use illegal drugs. Some   items may interact with your medicine. What should I watch for while using this medicine? Your condition will be monitored carefully while you are receiving this medicine. You will need important blood work done while you are  taking this medicine. This drug may make you feel generally unwell. This is not uncommon, as chemotherapy can affect healthy cells as well as cancer cells. Report any side effects. Continue your course of treatment even though you feel ill unless your doctor tells you to stop. In some cases, you may be given additional medicines to help with side effects. Follow all directions for their use. Call your doctor or health care professional for advice if you get a fever, chills or sore throat, or other symptoms of a cold or flu. Do not treat yourself. This drug decreases your body's ability to fight infections. Try to avoid being around people who are sick. This medicine may increase your risk to bruise or bleed. Call your doctor or health care professional if you notice any unusual bleeding. Be careful brushing and flossing your teeth or using a toothpick because you may get an infection or bleed more easily. If you have any dental work done, tell your dentist you are receiving this medicine. Avoid taking products that contain aspirin, acetaminophen, ibuprofen, naproxen, or ketoprofen unless instructed by your doctor. These medicines may hide a fever. Do not become pregnant while taking this medicine. Women should inform their doctor if they wish to become pregnant or think they might be pregnant. There is a potential for serious side effects to an unborn child. Talk to your health care professional or pharmacist for more information. Do not breast-feed an infant while taking this medicine. Men are advised not to father a child while receiving this medicine. What side effects may I notice from receiving this medicine? Side effects that you should report to your doctor or health care professional as soon as possible: -allergic reactions like skin rash, itching or hives, swelling of the face, lips, or tongue -low blood counts - This drug may decrease the number of white blood cells, red blood cells and  platelets. You may be at increased risk for infections and bleeding. -signs of infection - fever or chills, cough, sore throat, pain or difficulty passing urine -signs of decreased platelets or bleeding - bruising, pinpoint red spots on the skin, black, tarry stools, nosebleeds -signs of decreased red blood cells - unusually weak or tired, fainting spells, lightheadedness -breathing problems -changes in vision -chest pain -high or low blood pressure -mouth sores Implanted Port Insertion, Care After Refer to this sheet in the next few weeks. These instructions provide you with information on caring for yourself after your procedure. Your health care provider may also give you more specific instructions. Your treatment has been planned according to current medical practices, but problems sometimes occur. Call your health care provider if you have any problems or questions after your procedure. WHAT TO EXPECT AFTER THE PROCEDURE After your procedure, it is typical to have the following:   Discomfort at the port insertion site. Ice packs to the area will help.  Bruising on the skin over the port. This will subside in 3-4 days. HOME CARE INSTRUCTIONS  After your port is placed, you will get a manufacturer's information card. The card has information about your port. Keep this card with you at all times.   Know what kind of port you have. There are many types of ports available.   Wear a medical   alert bracelet in case of an emergency. This can help alert health care workers that you have a port.   The port can stay in for as long as your health care provider believes it is necessary.   A home health care nurse may give medicines and take care of the port.   You or a family member can get special training and directions for giving medicine and taking care of the port at home.  SEEK MEDICAL CARE IF:   Your port does not flush or you are unable to get a blood return.   You have a  fever or chills. SEEK IMMEDIATE MEDICAL CARE IF:  You have new fluid or pus coming from your incision.   You notice a bad smell coming from your incision site.   You have swelling, pain, or more redness at the incision or port site.   You have chest pain or shortness of breath. Document Released: 08/09/2013 Document Revised: 10/24/2013 Document Reviewed: 08/09/2013 ExitCare Patient Information 2015 [employer]. This information is not intended to replace advice given to you by your health care provider. Make sure you discuss any questions you have with your health care provider.  

## 2014-09-24 ENCOUNTER — Telehealth: Payer: Self-pay | Admitting: Hematology & Oncology

## 2014-09-24 NOTE — Telephone Encounter (Signed)
Pt left message to r/s 11-25, I left her message to call

## 2014-09-25 ENCOUNTER — Other Ambulatory Visit: Payer: Self-pay | Admitting: *Deleted

## 2014-09-26 ENCOUNTER — Ambulatory Visit: Payer: Medicare Other

## 2014-09-26 ENCOUNTER — Other Ambulatory Visit: Payer: Medicare Other | Admitting: Lab

## 2014-10-01 ENCOUNTER — Telehealth: Payer: Self-pay | Admitting: Hematology & Oncology

## 2014-10-01 NOTE — Telephone Encounter (Signed)
Pt missed tx last week, per MD cx 12-2 lab and pt will come back on 12-9, she is aware

## 2014-10-03 ENCOUNTER — Other Ambulatory Visit: Payer: Medicare Other | Admitting: Lab

## 2014-10-10 ENCOUNTER — Ambulatory Visit (HOSPITAL_BASED_OUTPATIENT_CLINIC_OR_DEPARTMENT_OTHER): Payer: Medicare Other | Admitting: Family

## 2014-10-10 ENCOUNTER — Other Ambulatory Visit: Payer: Self-pay | Admitting: *Deleted

## 2014-10-10 ENCOUNTER — Other Ambulatory Visit (HOSPITAL_BASED_OUTPATIENT_CLINIC_OR_DEPARTMENT_OTHER): Payer: Medicare Other | Admitting: Lab

## 2014-10-10 ENCOUNTER — Ambulatory Visit (HOSPITAL_BASED_OUTPATIENT_CLINIC_OR_DEPARTMENT_OTHER): Payer: Medicare Other

## 2014-10-10 ENCOUNTER — Encounter: Payer: Self-pay | Admitting: Family

## 2014-10-10 DIAGNOSIS — Z5111 Encounter for antineoplastic chemotherapy: Secondary | ICD-10-CM

## 2014-10-10 DIAGNOSIS — C259 Malignant neoplasm of pancreas, unspecified: Secondary | ICD-10-CM

## 2014-10-10 DIAGNOSIS — G8929 Other chronic pain: Secondary | ICD-10-CM

## 2014-10-10 DIAGNOSIS — C250 Malignant neoplasm of head of pancreas: Secondary | ICD-10-CM

## 2014-10-10 DIAGNOSIS — C7800 Secondary malignant neoplasm of unspecified lung: Secondary | ICD-10-CM

## 2014-10-10 DIAGNOSIS — K8689 Other specified diseases of pancreas: Secondary | ICD-10-CM

## 2014-10-10 DIAGNOSIS — M549 Dorsalgia, unspecified: Secondary | ICD-10-CM

## 2014-10-10 LAB — CBC WITH DIFFERENTIAL (CANCER CENTER ONLY)
BASO#: 0.1 10*3/uL (ref 0.0–0.2)
BASO%: 0.6 % (ref 0.0–2.0)
EOS ABS: 0.4 10*3/uL (ref 0.0–0.5)
EOS%: 3.1 % (ref 0.0–7.0)
HCT: 35.3 % (ref 34.8–46.6)
HGB: 11.4 g/dL — ABNORMAL LOW (ref 11.6–15.9)
LYMPH#: 4.3 10*3/uL — AB (ref 0.9–3.3)
LYMPH%: 34.7 % (ref 14.0–48.0)
MCH: 24.4 pg — ABNORMAL LOW (ref 26.0–34.0)
MCHC: 32.3 g/dL (ref 32.0–36.0)
MCV: 76 fL — ABNORMAL LOW (ref 81–101)
MONO#: 1.4 10*3/uL — AB (ref 0.1–0.9)
MONO%: 11.4 % (ref 0.0–13.0)
NEUT%: 50.2 % (ref 39.6–80.0)
NEUTROS ABS: 6.2 10*3/uL (ref 1.5–6.5)
PLATELETS: 596 10*3/uL — AB (ref 145–400)
RBC: 4.67 10*6/uL (ref 3.70–5.32)
RDW: 18.6 % — AB (ref 11.1–15.7)
WBC: 12.3 10*3/uL — AB (ref 3.9–10.0)

## 2014-10-10 LAB — CMP (CANCER CENTER ONLY)
ALBUMIN: 3.4 g/dL (ref 3.3–5.5)
ALK PHOS: 105 U/L — AB (ref 26–84)
ALT: 28 U/L (ref 10–47)
AST: 31 U/L (ref 11–38)
BUN: 17 mg/dL (ref 7–22)
CALCIUM: 8.5 mg/dL (ref 8.0–10.3)
CHLORIDE: 105 meq/L (ref 98–108)
CO2: 21 mEq/L (ref 18–33)
Creat: 0.8 mg/dl (ref 0.6–1.2)
Glucose, Bld: 138 mg/dL — ABNORMAL HIGH (ref 73–118)
POTASSIUM: 3.3 meq/L (ref 3.3–4.7)
SODIUM: 141 meq/L (ref 128–145)
Total Bilirubin: 0.6 mg/dl (ref 0.20–1.60)
Total Protein: 8.2 g/dL — ABNORMAL HIGH (ref 6.4–8.1)

## 2014-10-10 LAB — TECHNOLOGIST REVIEW CHCC SATELLITE

## 2014-10-10 MED ORDER — SODIUM CHLORIDE 0.9 % IV SOLN
Freq: Once | INTRAVENOUS | Status: AC
  Administered 2014-10-10: 12:00:00 via INTRAVENOUS

## 2014-10-10 MED ORDER — ONDANSETRON 8 MG/50ML IVPB (CHCC)
8.0000 mg | Freq: Once | INTRAVENOUS | Status: AC
  Administered 2014-10-10: 8 mg via INTRAVENOUS

## 2014-10-10 MED ORDER — SODIUM CHLORIDE 0.9 % IJ SOLN
10.0000 mL | INTRAMUSCULAR | Status: DC | PRN
  Administered 2014-10-10: 10 mL
  Filled 2014-10-10: qty 10

## 2014-10-10 MED ORDER — PACLITAXEL PROTEIN-BOUND CHEMO INJECTION 100 MG
100.0000 mg/m2 | Freq: Once | INTRAVENOUS | Status: AC
  Administered 2014-10-10: 150 mg via INTRAVENOUS
  Filled 2014-10-10: qty 30

## 2014-10-10 MED ORDER — HEPARIN SOD (PORK) LOCK FLUSH 100 UNIT/ML IV SOLN
500.0000 [IU] | Freq: Once | INTRAVENOUS | Status: AC | PRN
  Administered 2014-10-10: 500 [IU]
  Filled 2014-10-10: qty 5

## 2014-10-10 MED ORDER — DEXAMETHASONE SODIUM PHOSPHATE 10 MG/ML IJ SOLN
INTRAMUSCULAR | Status: AC
  Filled 2014-10-10: qty 1

## 2014-10-10 MED ORDER — AMLODIPINE BESYLATE 10 MG PO TABS: 10.0000 mg | ORAL_TABLET | Freq: Every day | ORAL | Status: DC

## 2014-10-10 MED ORDER — ZOLEDRONIC ACID 4 MG/100ML IV SOLN
4.0000 mg | Freq: Once | INTRAVENOUS | Status: AC
  Administered 2014-10-10: 4 mg via INTRAVENOUS
  Filled 2014-10-10: qty 100

## 2014-10-10 MED ORDER — DEXAMETHASONE SODIUM PHOSPHATE 10 MG/ML IJ SOLN
10.0000 mg | Freq: Once | INTRAMUSCULAR | Status: AC
  Administered 2014-10-10: 10 mg via INTRAVENOUS

## 2014-10-10 MED ORDER — SODIUM CHLORIDE 0.9 % IV SOLN
800.0000 mg/m2 | Freq: Once | INTRAVENOUS | Status: AC
  Administered 2014-10-10: 1292 mg via INTRAVENOUS
  Filled 2014-10-10: qty 33.98

## 2014-10-10 NOTE — Patient Instructions (Addendum)
  [facility] Discharge Instructions for Patients Receiving Chemotherapy  Today you received the following chemotherapy agents Gemzar and Abraxane.  To help prevent nausea and vomiting after your treatment, we encourage you to take your nausea medication as directed.   If you develop nausea and vomiting that is not controlled by your nausea medication, call the clinic.   BELOW ARE SYMPTOMS THAT SHOULD BE REPORTED IMMEDIATELY:  *FEVER GREATER THAN 100.5 F  *CHILLS WITH OR WITHOUT FEVER  NAUSEA AND VOMITING THAT IS NOT CONTROLLED WITH YOUR NAUSEA MEDICATION  *UNUSUAL SHORTNESS OF BREATH  *UNUSUAL BRUISING OR BLEEDING  TENDERNESS IN MOUTH AND THROAT WITH OR WITHOUT PRESENCE OF ULCERS  *URINARY PROBLEMS  *BOWEL PROBLEMS  UNUSUAL RASH Items with * indicate a potential emergency and should be followed up as soon as possible.  Feel free to call the clinic you have any questions or concerns. The clinic phone number is [phone].

## 2014-10-10 NOTE — Progress Notes (Signed)
[facility]  Telephone:(336) [phone] Fax:(336) [phone]  ID: Jacqueline Hahn OB: [birth year] MR#: [id number]#:[id number] Patient Care Team: Midge Minium, MD as PCP - General (Family Medicine)  DIAGNOSIS: Metastatic adenocarcinoma of the pancreas  INTERVAL HISTORY: Jacqueline Hahn is here today for Cycle 3 of treatment. She is doing well. Her recent scans showed improvement and a response to treatment. She took a week off of treatment for Thanksgiving to enjoy the week with her family. She had a wonderful time.  She has had no bleeding or pain. She denies fever, chills, vomiting, cough, rash, headache, dizziness, SOB, chest pain, palpitations, abdominal pain, constipation, diarrhea, blood in urine or stool. She had some nausea last week. She took her antiemetics and it cleared up.  She has had no issues with infections.  She has had no swelling, tenderness, numbness or tingling in her extremities.  Her appetite is good and she is staying hydrated.   CURRENT TREATMENT: Gemcitabine/Abraxane (started 07/18/14)  REVIEW OF SYSTEMS: All other 10 point review of systems is negative.  PAST MEDICAL HISTORY: Past Medical History  Diagnosis Date  . Hypertension   . UTI (urinary tract infection)   . History of stomach ulcers   . Diabetes mellitus without complication   . Complication of anesthesia     hx of n/v  . PONV (postoperative nausea and vomiting)   . Pancreatic cancer metastasized to lung 07/10/2014   PAST SURGICAL HISTORY: Past Surgical History  Procedure Laterality Date  . Dental surgery    . Bunionectomy    . Ercp N/A 06/23/2014    Procedure: ENDOSCOPIC RETROGRADE CHOLANGIOPANCREATOGRAPHY (ERCP);  Surgeon: Ladene Artist, MD;  Location: [facility] ENDOSCOPY;  Service: Endoscopy;  Laterality: N/A;  . Eus N/A 06/29/2014    Procedure: UPPER ENDOSCOPIC ULTRASOUND (EUS) LINEAR;  Surgeon: Beryle Beams, MD;  Location: [facility] ENDOSCOPY;  Service: Endoscopy;  Laterality: N/A;  . Fine needle  aspiration N/A 06/29/2014    Procedure: FINE NEEDLE ASPIRATION (FNA) LINEAR;  Surgeon: Beryle Beams, MD;  Location: [facility] ENDOSCOPY;  Service: Endoscopy;  Laterality: N/A;   FAMILY HISTORY Family History  Problem Relation Age of Onset  . Heart attack Mother   . Stroke Mother   . Heart attack Father   . Hyperlipidemia Sister   . Hypertension Sister   . Hypertension Brother   . Alcohol abuse Brother    GYNECOLOGIC HISTORY:  No LMP recorded. Patient is postmenopausal.   SOCIAL HISTORY:  History   Social History  . Marital Status: Single    Spouse Name: N/A    Number of Children: N/A  . Years of Education: N/A   Occupational History  . Not on file.   Social History Main Topics  . Smoking status: Former Smoker -- 1.00 packs/day for 4 years    Types: Cigarettes    Start date: 02/03/1963    Quit date: 03/05/1967  . Smokeless tobacco: Never Used     Comment: quit 45 years ago  . Alcohol Use: No  . Drug Use: No  . Sexual Activity: Not on file   Other Topics Concern  . Not on file   Social History Narrative   ADVANCED DIRECTIVES: <no information>  HEALTH MAINTENANCE: History  Substance Use Topics  . Smoking status: Former Smoker -- 1.00 packs/day for 4 years    Types: Cigarettes    Start date: 02/03/1963    Quit date: 03/05/1967  . Smokeless tobacco: Never Used     Comment: quit  45 years ago  . Alcohol Use: No   Colonoscopy: PAP: Bone density: Lipid panel:  No Known Allergies  Current Outpatient Prescriptions  Medication Sig Dispense Refill  . amLODipine (NORVASC) 10 MG tablet Take 10 mg by mouth daily.    . Calcium Carbonate-Vitamin D (CALCIUM + D PO) Take 1 tablet by mouth daily.    . diphenoxylate-atropine (LOMOTIL) 2.5-0.025 MG per tablet Take 1 tablet by mouth every 4 (four) hours as needed for diarrhea or loose stools (max 6 pills per day). 30 tablet 0  . Flaxseed, Linseed, (FLAX PO) Take 1 tablet by mouth daily.    Marland Kitchen glipiZIDE-metformin (METAGLIP)  5-500 MG per tablet 1 tablet 2 (two) times daily before a meal.   0  . lansoprazole (PREVACID) 15 MG capsule Take 15 mg by mouth daily as needed (for heartburn).     Marland Kitchen lidocaine-prilocaine (EMLA) cream Apply 1 application topically as needed. Apply quarter sized amount to portacath site at least one hour prior to chemo treatment 30 g 0  . lipase/protease/amylase (CREON) 12000 UNITS CPEP capsule Take 12,000 Units by mouth daily as needed (for upset stomach).     . megestrol (MEGACE) 400 MG/10ML suspension Take 800 mg by mouth daily.    . Multiple Vitamin (MULTIVITAMIN WITH MINERALS) TABS tablet Take 1 tablet by mouth daily.    Marland Kitchen pyridOXINE (VITAMIN B-6) 100 MG tablet Take 100 mg by mouth daily.    . vitamin A 8000 UNIT capsule Take 8,000 Units by mouth daily.    . vitamin E 400 UNIT capsule Take 400 Units by mouth daily.     No current facility-administered medications for this visit.   Facility-Administered Medications Ordered in Other Visits  Medication Dose Route Frequency Provider Last Rate Last Dose  . Gemcitabine HCl (GEMZAR) 1,292 mg in sodium chloride 0.9 % 100 mL chemo infusion  800 mg/m2 (Treatment Plan Actual) Intravenous Once [name], MD      . heparin lock flush 100 unit/mL  500 Units Intracatheter Once PRN [name], MD      . ondansetron (ZOFRAN) IVPB 8 mg  8 mg Intravenous Once [name], MD   8 mg at 10/10/14 1202  . PACLitaxel-protein bound (ABRAXANE) chemo infusion 150 mg  100 mg/m2 (Treatment Plan Actual) Intravenous Once [name], MD      . sodium chloride 0.9 % injection 10 mL  10 mL Intracatheter PRN [name], MD      . zolendronic acid (ZOMETA) 4 mg in sodium chloride 0.9 % 100 mL IVPB  4 mg Intravenous Once [name], MD       OBJECTIVE: Filed Vitals:   10/10/14 1100  BP: 160/92  Pulse: 112  Temp: 98 F (36.7 C)  Resp: 20   Body mass index is 25.97 kg/(m^2). ECOG FS:0 - Asymptomatic Ocular: Sclerae unicteric, pupils equal,  round and reactive to light Ear-nose-throat: Oropharynx clear, dentition fair Lymphatic: No cervical or supraclavicular adenopathy Lungs no rales or rhonchi, good excursion bilaterally Heart regular rate and rhythm, no murmur appreciated Abd soft, nontender, positive bowel sounds MSK no focal spinal tenderness, no joint edema Neuro: non-focal, well-oriented, appropriate affect Breasts: Deferred   LAB RESULTS: CMP     Component Value Date/Time   NA 141 10/10/2014 1042   NA 136* 09/01/2014 1148   K 3.3 10/10/2014 1042   K 3.5* 09/01/2014 1148   CL 105 10/10/2014 1042   CL 102 09/01/2014 1148  CO2 21 10/10/2014 1042   CO2 19 09/01/2014 1148   GLUCOSE 138* 10/10/2014 1042   GLUCOSE 244* 09/01/2014 1148   BUN 17 10/10/2014 1042   BUN 11 09/01/2014 1148   CREATININE 0.8 10/10/2014 1042   CREATININE 0.64 09/01/2014 1148   CALCIUM 8.5 10/10/2014 1042   CALCIUM 9.3 09/01/2014 1148   PROT 8.2* 10/10/2014 1042   PROT 7.3 09/01/2014 1148   ALBUMIN 3.2* 09/01/2014 1148   AST 31 10/10/2014 1042   AST 48* 09/01/2014 1148   ALT 28 10/10/2014 1042   ALT 85* 09/01/2014 1148   ALKPHOS 105* 10/10/2014 1042   ALKPHOS 121* 09/01/2014 1148   BILITOT 0.60 10/10/2014 1042   BILITOT 0.6 09/01/2014 1148   GFRNONAA >90 09/01/2014 1148   GFRAA >90 09/01/2014 1148   No results found for: SPEP Lab Results  Component Value Date   WBC 12.3* 10/10/2014   NEUTROABS 6.2 10/10/2014   HGB 11.4* 10/10/2014   HCT 35.3 10/10/2014   MCV 76* 10/10/2014   PLT 596* 10/10/2014   No results found for: LABCA2 No components found for: [id number] No results for input(s): INR in the last 168 hours. STUDIES: None  ASSESSMENT/PLAN: Jacqueline Hahn is 68 year old female. She is metastatic adenocarcinoma of pancreas. Her CA 19-9 level in September was 14,523. She seems to be doing well and is asymptomatic at this time.  She will proceed with Cycle 3 of gemcitabine/abraxane today.  Her scans in November showed  improvement of disease.  Her WBC is 12.3. She has had no issue with infections. We will see what the rest of her labs show.  We will move her next treatment to December 30th (so she will be able to enjoy Christmas) and give her a new schedule. She knows to call here with any questions or concerns and to go to the ED in the event of an emergency. We can certainly see her sooner if need be.   Eliezer Bottom, NP 10/10/2014 12:11 PM

## 2014-10-11 LAB — CANCER ANTIGEN 19-9: CA 19 9: 604.9 U/mL — AB (ref <35.0)

## 2014-10-11 LAB — PREALBUMIN: PREALBUMIN: 25.3 mg/dL (ref 17.0–34.0)

## 2014-10-17 ENCOUNTER — Ambulatory Visit: Payer: Medicare Other

## 2014-10-17 ENCOUNTER — Other Ambulatory Visit: Payer: Medicare Other | Admitting: Lab

## 2014-10-22 ENCOUNTER — Ambulatory Visit: Payer: Medicare Other | Admitting: Family Medicine

## 2014-10-24 ENCOUNTER — Ambulatory Visit: Payer: Medicare Other

## 2014-10-24 ENCOUNTER — Other Ambulatory Visit: Payer: Medicare Other | Admitting: Lab

## 2014-10-31 ENCOUNTER — Other Ambulatory Visit (HOSPITAL_BASED_OUTPATIENT_CLINIC_OR_DEPARTMENT_OTHER): Payer: Medicare Other | Admitting: Lab

## 2014-10-31 ENCOUNTER — Ambulatory Visit (HOSPITAL_BASED_OUTPATIENT_CLINIC_OR_DEPARTMENT_OTHER): Payer: Medicare Other | Admitting: Family

## 2014-10-31 ENCOUNTER — Ambulatory Visit (HOSPITAL_BASED_OUTPATIENT_CLINIC_OR_DEPARTMENT_OTHER): Payer: Medicare Other

## 2014-10-31 DIAGNOSIS — Z5111 Encounter for antineoplastic chemotherapy: Secondary | ICD-10-CM

## 2014-10-31 DIAGNOSIS — C259 Malignant neoplasm of pancreas, unspecified: Secondary | ICD-10-CM

## 2014-10-31 DIAGNOSIS — C7800 Secondary malignant neoplasm of unspecified lung: Secondary | ICD-10-CM

## 2014-10-31 DIAGNOSIS — C250 Malignant neoplasm of head of pancreas: Secondary | ICD-10-CM

## 2014-10-31 DIAGNOSIS — K8689 Other specified diseases of pancreas: Secondary | ICD-10-CM

## 2014-10-31 LAB — CMP (CANCER CENTER ONLY)
ALBUMIN: 3.6 g/dL (ref 3.3–5.5)
ALT(SGPT): 29 U/L (ref 10–47)
AST: 29 U/L (ref 11–38)
Alkaline Phosphatase: 96 U/L — ABNORMAL HIGH (ref 26–84)
BUN, Bld: 8 mg/dL (ref 7–22)
CALCIUM: 9.4 mg/dL (ref 8.0–10.3)
CO2: 21 meq/L (ref 18–33)
Chloride: 106 mEq/L (ref 98–108)
Creat: 0.9 mg/dl (ref 0.6–1.2)
Glucose, Bld: 121 mg/dL — ABNORMAL HIGH (ref 73–118)
POTASSIUM: 3.3 meq/L (ref 3.3–4.7)
Sodium: 143 mEq/L (ref 128–145)
Total Bilirubin: 0.5 mg/dl (ref 0.20–1.60)
Total Protein: 8.2 g/dL — ABNORMAL HIGH (ref 6.4–8.1)

## 2014-10-31 LAB — CBC WITH DIFFERENTIAL (CANCER CENTER ONLY)
BASO#: 0 10*3/uL (ref 0.0–0.2)
BASO%: 0.3 % (ref 0.0–2.0)
EOS%: 3.4 % (ref 0.0–7.0)
Eosinophils Absolute: 0.4 10*3/uL (ref 0.0–0.5)
HCT: 37 % (ref 34.8–46.6)
HGB: 12.1 g/dL (ref 11.6–15.9)
LYMPH#: 4.7 10*3/uL — ABNORMAL HIGH (ref 0.9–3.3)
LYMPH%: 40.7 % (ref 14.0–48.0)
MCH: 24.8 pg — AB (ref 26.0–34.0)
MCHC: 32.7 g/dL (ref 32.0–36.0)
MCV: 76 fL — ABNORMAL LOW (ref 81–101)
MONO#: 1.2 10*3/uL — ABNORMAL HIGH (ref 0.1–0.9)
MONO%: 10.2 % (ref 0.0–13.0)
NEUT%: 45.4 % (ref 39.6–80.0)
NEUTROS ABS: 5.3 10*3/uL (ref 1.5–6.5)
PLATELETS: 540 10*3/uL — AB (ref 145–400)
RBC: 4.87 10*6/uL (ref 3.70–5.32)
RDW: 17.8 % — ABNORMAL HIGH (ref 11.1–15.7)
WBC: 11.6 10*3/uL — ABNORMAL HIGH (ref 3.9–10.0)

## 2014-10-31 LAB — CANCER ANTIGEN 19-9: CA 19-9: 1210.5 U/mL — ABNORMAL HIGH (ref <35.0)

## 2014-10-31 LAB — PREALBUMIN: PREALBUMIN: 28.1 mg/dL (ref 17.0–34.0)

## 2014-10-31 MED ORDER — HEPARIN SOD (PORK) LOCK FLUSH 100 UNIT/ML IV SOLN
500.0000 [IU] | Freq: Once | INTRAVENOUS | Status: AC | PRN
  Administered 2014-10-31: 500 [IU]
  Filled 2014-10-31: qty 5

## 2014-10-31 MED ORDER — PACLITAXEL PROTEIN-BOUND CHEMO INJECTION 100 MG
100.0000 mg/m2 | Freq: Once | INTRAVENOUS | Status: AC
  Administered 2014-10-31: 150 mg via INTRAVENOUS
  Filled 2014-10-31: qty 30

## 2014-10-31 MED ORDER — SODIUM CHLORIDE 0.9 % IV SOLN
Freq: Once | INTRAVENOUS | Status: AC
  Administered 2014-10-31: 12:00:00 via INTRAVENOUS

## 2014-10-31 MED ORDER — DEXAMETHASONE SODIUM PHOSPHATE 10 MG/ML IJ SOLN
INTRAMUSCULAR | Status: AC
  Filled 2014-10-31: qty 1

## 2014-10-31 MED ORDER — SODIUM CHLORIDE 0.9 % IJ SOLN
10.0000 mL | INTRAMUSCULAR | Status: DC | PRN
  Administered 2014-10-31: 10 mL
  Filled 2014-10-31: qty 10

## 2014-10-31 MED ORDER — ONDANSETRON 8 MG/50ML IVPB (CHCC)
8.0000 mg | Freq: Once | INTRAVENOUS | Status: AC
  Administered 2014-10-31: 8 mg via INTRAVENOUS

## 2014-10-31 MED ORDER — SODIUM CHLORIDE 0.9 % IV SOLN
800.0000 mg/m2 | Freq: Once | INTRAVENOUS | Status: AC
  Administered 2014-10-31: 1292 mg via INTRAVENOUS
  Filled 2014-10-31: qty 33.98

## 2014-10-31 MED ORDER — DEXAMETHASONE SODIUM PHOSPHATE 10 MG/ML IJ SOLN
10.0000 mg | Freq: Once | INTRAMUSCULAR | Status: AC
  Administered 2014-10-31: 10 mg via INTRAVENOUS

## 2014-10-31 NOTE — Progress Notes (Signed)
[facility]  Telephone:(336) [phone] Fax:(336) [phone]  ID: Sherril Cong OB: Aug 15, 1946 MR#: [id number]#:[id number] Patient Care Team: Midge Minium, MD as PCP - General (Family Medicine)  DIAGNOSIS: Metastatic adenocarcinoma of the pancreas  INTERVAL HISTORY: Ms. Bergeman is here today for Cycle 4 of treatment. She is doing well. Her recent scan in November showed improvement and response to treatment.  She has had no bleeding or pain. She denies fever, chills, n/v, cough, rash, headache, dizziness, SOB, chest pain, palpitations, abdominal pain, diarrhea, blood in urine or stool. She has had no issues with infections.  She has had some constipation but is able to eat pears and help with this.  She has had no swelling, tenderness, numbness or tingling in her extremities.  Her appetite is good and she is staying hydrated. Her weight is improving. She is up to 136 lbs.   CURRENT TREATMENT: Gemcitabine/Abraxane (started 07/18/14)  REVIEW OF SYSTEMS: All other 10 point review of systems is negative.  PAST MEDICAL HISTORY: Past Medical History  Diagnosis Date  . Hypertension   . UTI (urinary tract infection)   . History of stomach ulcers   . Diabetes mellitus without complication   . Complication of anesthesia     hx of n/v  . PONV (postoperative nausea and vomiting)   . Pancreatic cancer metastasized to lung 07/10/2014   PAST SURGICAL HISTORY: Past Surgical History  Procedure Laterality Date  . Dental surgery    . Bunionectomy    . Ercp N/A 06/23/2014    Procedure: ENDOSCOPIC RETROGRADE CHOLANGIOPANCREATOGRAPHY (ERCP);  Surgeon: Ladene Artist, MD;  Location: [facility] ENDOSCOPY;  Service: Endoscopy;  Laterality: N/A;  . Eus N/A 06/29/2014    Procedure: UPPER ENDOSCOPIC ULTRASOUND (EUS) LINEAR;  Surgeon: Beryle Beams, MD;  Location: [facility] ENDOSCOPY;  Service: Endoscopy;  Laterality: N/A;  . Fine needle aspiration N/A 06/29/2014    Procedure: FINE NEEDLE ASPIRATION (FNA)  LINEAR;  Surgeon: Beryle Beams, MD;  Location: [facility] ENDOSCOPY;  Service: Endoscopy;  Laterality: N/A;   FAMILY HISTORY Family History  Problem Relation Age of Onset  . Heart attack Mother   . Stroke Mother   . Heart attack Father   . Hyperlipidemia Sister   . Hypertension Sister   . Hypertension Brother   . Alcohol abuse Brother    GYNECOLOGIC HISTORY:  No LMP recorded. Patient is postmenopausal.   SOCIAL HISTORY:  History   Social History  . Marital Status: Single    Spouse Name: N/A    Number of Children: N/A  . Years of Education: N/A   Occupational History  . Not on file.   Social History Main Topics  . Smoking status: Former Smoker -- 1.00 packs/day for 4 years    Types: Cigarettes    Start date: 02/03/1963    Quit date: 03/05/1967  . Smokeless tobacco: Never Used     Comment: quit 45 years ago  . Alcohol Use: No  . Drug Use: No  . Sexual Activity: Not on file   Other Topics Concern  . Not on file   Social History Narrative   ADVANCED DIRECTIVES: <no information>  HEALTH MAINTENANCE: History  Substance Use Topics  . Smoking status: Former Smoker -- 1.00 packs/day for 4 years    Types: Cigarettes    Start date: 02/03/1963    Quit date: 03/05/1967  . Smokeless tobacco: Never Used     Comment: quit 45 years ago  . Alcohol Use: No  Colonoscopy: PAP: Bone density: Lipid panel:  No Known Allergies  Current Outpatient Prescriptions  Medication Sig Dispense Refill  . amLODipine (NORVASC) 10 MG tablet Take 1 tablet (10 mg total) by mouth daily. 30 tablet 3  . Calcium Carbonate-Vitamin D (CALCIUM + D PO) Take 1 tablet by mouth daily.    . diphenoxylate-atropine (LOMOTIL) 2.5-0.025 MG per tablet Take 1 tablet by mouth every 4 (four) hours as needed for diarrhea or loose stools (max 6 pills per day). 30 tablet 0  . Flaxseed, Linseed, (FLAX PO) Take 1 tablet by mouth daily.    Marland Kitchen glipiZIDE-metformin (METAGLIP) 5-500 MG per tablet 1 tablet 2 (two) times  daily before a meal.   0  . lansoprazole (PREVACID) 15 MG capsule Take 15 mg by mouth daily as needed (for heartburn).     Marland Kitchen lidocaine-prilocaine (EMLA) cream Apply 1 application topically as needed. Apply quarter sized amount to portacath site at least one hour prior to chemo treatment 30 g 0  . lipase/protease/amylase (CREON) 12000 UNITS CPEP capsule Take 12,000 Units by mouth daily as needed (for upset stomach).     . megestrol (MEGACE) 400 MG/10ML suspension Take 800 mg by mouth daily.    . Multiple Vitamin (MULTIVITAMIN WITH MINERALS) TABS tablet Take 1 tablet by mouth daily.    Marland Kitchen pyridOXINE (VITAMIN B-6) 100 MG tablet Take 100 mg by mouth daily.    . vitamin A 8000 UNIT capsule Take 8,000 Units by mouth daily.    . vitamin E 400 UNIT capsule Take 400 Units by mouth daily.     No current facility-administered medications for this visit.   Facility-Administered Medications Ordered in Other Visits  Medication Dose Route Frequency Provider Last Rate Last Dose  . 0.9 %  sodium chloride infusion   Intravenous Once [name], MD      . dexamethasone (DECADRON) injection 10 mg  10 mg Intravenous Once [name], MD      . Gemcitabine HCl (GEMZAR) 1,292 mg in sodium chloride 0.9 % 100 mL chemo infusion  800 mg/m2 (Treatment Plan Actual) Intravenous Once [name], MD      . heparin lock flush 100 unit/mL  500 Units Intracatheter Once PRN [name], MD      . ondansetron (ZOFRAN) IVPB 8 mg  8 mg Intravenous Once [name], MD      . PACLitaxel-protein bound (ABRAXANE) chemo infusion 150 mg  100 mg/m2 (Treatment Plan Actual) Intravenous Once [name], MD      . sodium chloride 0.9 % injection 10 mL  10 mL Intracatheter PRN [name], MD       OBJECTIVE: Filed Vitals:   10/31/14 1115  BP: 150/97  Pulse: 89  Temp: 98.1 F (36.7 C)  Resp: 18   Body mass index is 26.56 kg/(m^2). ECOG FS:0 - Asymptomatic Ocular: Sclerae unicteric, pupils equal, round and  reactive to light Ear-nose-throat: Oropharynx clear, dentition fair Lymphatic: No cervical or supraclavicular adenopathy Lungs no rales or rhonchi, good excursion bilaterally Heart regular rate and rhythm, no murmur appreciated Abd soft, nontender, positive bowel sounds MSK no focal spinal tenderness, no joint edema Neuro: non-focal, well-oriented, appropriate affect Breasts: Deferred   LAB RESULTS: CMP     Component Value Date/Time   NA 143 10/31/2014 1038   NA 136* 09/01/2014 1148   K 3.3 10/31/2014 1038   K 3.5* 09/01/2014 1148   CL 106 10/31/2014 1038   CL 102 09/01/2014  1148   CO2 21 10/31/2014 1038   CO2 19 09/01/2014 1148   GLUCOSE 121* 10/31/2014 1038   GLUCOSE 244* 09/01/2014 1148   BUN 8 10/31/2014 1038   BUN 11 09/01/2014 1148   CREATININE 0.9 10/31/2014 1038   CREATININE 0.64 09/01/2014 1148   CALCIUM 9.4 10/31/2014 1038   CALCIUM 9.3 09/01/2014 1148   PROT 8.2* 10/31/2014 1038   PROT 7.3 09/01/2014 1148   ALBUMIN 3.2* 09/01/2014 1148   AST 29 10/31/2014 1038   AST 48* 09/01/2014 1148   ALT 29 10/31/2014 1038   ALT 85* 09/01/2014 1148   ALKPHOS 96* 10/31/2014 1038   ALKPHOS 121* 09/01/2014 1148   BILITOT 0.50 10/31/2014 1038   BILITOT 0.6 09/01/2014 1148   GFRNONAA >90 09/01/2014 1148   GFRAA >90 09/01/2014 1148   No results found for: SPEP Lab Results  Component Value Date   WBC 11.6* 10/31/2014   NEUTROABS 5.3 10/31/2014   HGB 12.1 10/31/2014   HCT 37.0 10/31/2014   MCV 76* 10/31/2014   PLT 540* 10/31/2014   No results found for: LABCA2 No components found for: [id number] No results for input(s): INR in the last 168 hours. STUDIES: None  ASSESSMENT/PLAN: Ms. Klutz is 68 year old female. She is metastatic adenocarcinoma of pancreas. Her CA 19-9 level earlier this month was 604. She seems to be doing well and is asymptomatic at this time.  She will proceed with Cycle 4 of gemcitabine/abraxane today.  We will repeat her CT scans the week of Jan  19th.  Her WBC is 11.6. She has had no issue with infections. We will see what the rest of her labs show.  We will give her a new treatment and appointment schedule. She knows to call here with any questions or concerns and to go to the ED in the event of an emergency. We can certainly see her sooner if need be.   Eliezer Bottom, NP 10/31/2014 12:04 PM

## 2014-10-31 NOTE — Patient Instructions (Signed)
Gemcitabine injection What is this medicine? GEMCITABINE (jem SIT a been) is a chemotherapy drug. This medicine is used to treat many types of cancer like breast cancer, lung cancer, pancreatic cancer, and ovarian cancer. This medicine may be used for other purposes; ask your health care provider or pharmacist if you have questions. COMMON BRAND NAME(S): Gemzar What should I tell my health care provider before I take this medicine? They need to know if you have any of these conditions: -blood disorders -infection -kidney disease -liver disease -recent or ongoing radiation therapy -an unusual or allergic reaction to gemcitabine, other chemotherapy, other medicines, foods, dyes, or preservatives -pregnant or trying to get pregnant -breast-feeding How should I use this medicine? This drug is given as an infusion into a vein. It is administered in a hospital or clinic by a specially trained health care professional. Talk to your pediatrician regarding the use of this medicine in children. Special care may be needed. Overdosage: If you think you have taken too much of this medicine contact a poison control center or emergency room at once. NOTE: This medicine is only for you. Do not share this medicine with others. What if I miss a dose? It is important not to miss your dose. Call your doctor or health care professional if you are unable to keep an appointment. What may interact with this medicine? -medicines to increase blood counts like filgrastim, pegfilgrastim, sargramostim -some other chemotherapy drugs like cisplatin -vaccines Talk to your doctor or health care professional before taking any of these medicines: -acetaminophen -aspirin -ibuprofen -ketoprofen -naproxen This list may not describe all possible interactions. Give your health care provider a list of all the medicines, herbs, non-prescription drugs, or dietary supplements you use. Also tell them if you smoke, drink alcohol,  or use illegal drugs. Some items may interact with your medicine. What should I watch for while using this medicine? Visit your doctor for checks on your progress. This drug may make you feel generally unwell. This is not uncommon, as chemotherapy can affect healthy cells as well as cancer cells. Report any side effects. Continue your course of treatment even though you feel ill unless your doctor tells you to stop. In some cases, you may be given additional medicines to help with side effects. Follow all directions for their use. Call your doctor or health care professional for advice if you get a fever, chills or sore throat, or other symptoms of a cold or flu. Do not treat yourself. This drug decreases your body's ability to fight infections. Try to avoid being around people who are sick. This medicine may increase your risk to bruise or bleed. Call your doctor or health care professional if you notice any unusual bleeding. Be careful brushing and flossing your teeth or using a toothpick because you may get an infection or bleed more easily. If you have any dental work done, tell your dentist you are receiving this medicine. Avoid taking products that contain aspirin, acetaminophen, ibuprofen, naproxen, or ketoprofen unless instructed by your doctor. These medicines may hide a fever. Women should inform their doctor if they wish to become pregnant or think they might be pregnant. There is a potential for serious side effects to an unborn child. Talk to your health care professional or pharmacist for more information. Do not breast-feed an infant while taking this medicine. What side effects may I notice from receiving this medicine? Side effects that you should report to your doctor or health care professional as   soon as possible: -allergic reactions like skin rash, itching or hives, swelling of the face, lips, or tongue -low blood counts - this medicine may decrease the number of white blood cells,  red blood cells and platelets. You may be at increased risk for infections and bleeding. -signs of infection - fever or chills, cough, sore throat, pain or difficulty passing urine -signs of decreased platelets or bleeding - bruising, pinpoint red spots on the skin, black, tarry stools, blood in the urine -signs of decreased red blood cells - unusually weak or tired, fainting spells, lightheadedness -breathing problems -chest pain -mouth sores -nausea and vomiting -pain, swelling, redness at site where injected -pain, tingling, numbness in the hands or feet -stomach pain -swelling of ankles, feet, hands -unusual bleeding Side effects that usually do not require medical attention (report to your doctor or health care professional if they continue or are bothersome): -constipation -diarrhea -hair loss -loss of appetite -stomach upset This list may not describe all possible side effects. Call your doctor for medical advice about side effects. You may report side effects to FDA at 1-800-FDA-1088. Where should I keep my medicine? This drug is given in a hospital or clinic and will not be stored at home. NOTE: This sheet is a summary. It may not cover all possible information. If you have questions about this medicine, talk to your doctor, pharmacist, or health care provider.  2015, [employer] Standard. (2008-02-28 18:45:54) Nanoparticle Albumin-Bound Paclitaxel injection What is this medicine? NANOPARTICLE ALBUMIN-BOUND PACLITAXEL (Na no PAHR ti kuhl al BYOO muhn-bound PAK li TAX el) is a chemotherapy drug. It targets fast dividing cells, like cancer cells, and causes these cells to die. This medicine is used to treat advanced breast cancer and advanced lung cancer. This medicine may be used for other purposes; ask your health care provider or pharmacist if you have questions. COMMON BRAND NAME(S): Abraxane What should I tell my health care provider before I take this medicine? They need  to know if you have any of these conditions: -kidney disease -liver disease -low blood counts, like low platelets, red blood cells, or white blood cells -recent or ongoing radiation therapy -an unusual or allergic reaction to paclitaxel, albumin, other chemotherapy, other medicines, foods, dyes, or preservatives -pregnant or trying to get pregnant -breast-feeding How should I use this medicine? This drug is given as an infusion into a vein. It is administered in a hospital or clinic by a specially trained health care professional. Talk to your pediatrician regarding the use of this medicine in children. Special care may be needed. Overdosage: If you think you have taken too much of this medicine contact a poison control center or emergency room at once. NOTE: This medicine is only for you. Do not share this medicine with others. What if I miss a dose? It is important not to miss your dose. Call your doctor or health care professional if you are unable to keep an appointment. What may interact with this medicine? -cyclosporine -diazepam -ketoconazole -medicines to increase blood counts like filgrastim, pegfilgrastim, sargramostim -other chemotherapy drugs like cisplatin, doxorubicin, epirubicin, etoposide, teniposide, vincristine -quinidine -testosterone -vaccines -verapamil Talk to your doctor or health care professional before taking any of these medicines: -acetaminophen -aspirin -ibuprofen -ketoprofen -naproxen This list may not describe all possible interactions. Give your health care provider a list of all the medicines, herbs, non-prescription drugs, or dietary supplements you use. Also tell them if you smoke, drink alcohol, or use illegal drugs. Some items may  interact with your medicine. What should I watch for while using this medicine? Your condition will be monitored carefully while you are receiving this medicine. You will need important blood work done while you are  taking this medicine. This drug may make you feel generally unwell. This is not uncommon, as chemotherapy can affect healthy cells as well as cancer cells. Report any side effects. Continue your course of treatment even though you feel ill unless your doctor tells you to stop. In some cases, you may be given additional medicines to help with side effects. Follow all directions for their use. Call your doctor or health care professional for advice if you get a fever, chills or sore throat, or other symptoms of a cold or flu. Do not treat yourself. This drug decreases your body's ability to fight infections. Try to avoid being around people who are sick. This medicine may increase your risk to bruise or bleed. Call your doctor or health care professional if you notice any unusual bleeding. Be careful brushing and flossing your teeth or using a toothpick because you may get an infection or bleed more easily. If you have any dental work done, tell your dentist you are receiving this medicine. Avoid taking products that contain aspirin, acetaminophen, ibuprofen, naproxen, or ketoprofen unless instructed by your doctor. These medicines may hide a fever. Do not become pregnant while taking this medicine. Women should inform their doctor if they wish to become pregnant or think they might be pregnant. There is a potential for serious side effects to an unborn child. Talk to your health care professional or pharmacist for more information. Do not breast-feed an infant while taking this medicine. Men are advised not to father a child while receiving this medicine. What side effects may I notice from receiving this medicine? Side effects that you should report to your doctor or health care professional as soon as possible: -allergic reactions like skin rash, itching or hives, swelling of the face, lips, or tongue -low blood counts - This drug may decrease the number of white blood cells, red blood cells and  platelets. You may be at increased risk for infections and bleeding. -signs of infection - fever or chills, cough, sore throat, pain or difficulty passing urine -signs of decreased platelets or bleeding - bruising, pinpoint red spots on the skin, black, tarry stools, nosebleeds -signs of decreased red blood cells - unusually weak or tired, fainting spells, lightheadedness -breathing problems -changes in vision -chest pain -high or low blood pressure -mouth sores -nausea and vomiting -pain, swelling, redness or irritation at the injection site -pain, tingling, numbness in the hands or feet -slow or irregular heartbeat -swelling of the ankle, feet, hands Side effects that usually do not require medical attention (report to your doctor or health care professional if they continue or are bothersome): -aches, pains -changes in the color of fingernails -diarrhea -hair loss -loss of appetite This list may not describe all possible side effects. Call your doctor for medical advice about side effects. You may report side effects to FDA at 1-800-FDA-1088. Where should I keep my medicine? This drug is given in a hospital or clinic and will not be stored at home. NOTE: This sheet is a summary. It may not cover all possible information. If you have questions about this medicine, talk to your doctor, pharmacist, or health care provider.  2015, [employer]/Gold Standard. (2012-12-12 16:48:50)

## 2014-11-07 ENCOUNTER — Other Ambulatory Visit (HOSPITAL_BASED_OUTPATIENT_CLINIC_OR_DEPARTMENT_OTHER): Payer: Medicare Other | Admitting: Lab

## 2014-11-07 ENCOUNTER — Ambulatory Visit (HOSPITAL_BASED_OUTPATIENT_CLINIC_OR_DEPARTMENT_OTHER): Payer: Medicare Other

## 2014-11-07 DIAGNOSIS — C259 Malignant neoplasm of pancreas, unspecified: Secondary | ICD-10-CM

## 2014-11-07 DIAGNOSIS — C7800 Secondary malignant neoplasm of unspecified lung: Secondary | ICD-10-CM

## 2014-11-07 DIAGNOSIS — M549 Dorsalgia, unspecified: Secondary | ICD-10-CM

## 2014-11-07 DIAGNOSIS — K8689 Other specified diseases of pancreas: Secondary | ICD-10-CM

## 2014-11-07 DIAGNOSIS — C250 Malignant neoplasm of head of pancreas: Secondary | ICD-10-CM

## 2014-11-07 DIAGNOSIS — Z5111 Encounter for antineoplastic chemotherapy: Secondary | ICD-10-CM

## 2014-11-07 DIAGNOSIS — G8929 Other chronic pain: Secondary | ICD-10-CM

## 2014-11-07 LAB — CBC WITH DIFFERENTIAL (CANCER CENTER ONLY)
BASO#: 0 10*3/uL (ref 0.0–0.2)
BASO%: 0.2 % (ref 0.0–2.0)
EOS%: 1.3 % (ref 0.0–7.0)
Eosinophils Absolute: 0.1 10*3/uL (ref 0.0–0.5)
HCT: 36 % (ref 34.8–46.6)
HGB: 11.6 g/dL (ref 11.6–15.9)
LYMPH#: 3.9 10*3/uL — ABNORMAL HIGH (ref 0.9–3.3)
LYMPH%: 41.6 % (ref 14.0–48.0)
MCH: 24.8 pg — ABNORMAL LOW (ref 26.0–34.0)
MCHC: 32.2 g/dL (ref 32.0–36.0)
MCV: 77 fL — AB (ref 81–101)
MONO#: 0.9 10*3/uL (ref 0.1–0.9)
MONO%: 9.3 % (ref 0.0–13.0)
NEUT%: 47.6 % (ref 39.6–80.0)
NEUTROS ABS: 4.4 10*3/uL (ref 1.5–6.5)
Platelets: 315 10*3/uL (ref 145–400)
RBC: 4.68 10*6/uL (ref 3.70–5.32)
RDW: 16.6 % — ABNORMAL HIGH (ref 11.1–15.7)
WBC: 9.3 10*3/uL (ref 3.9–10.0)

## 2014-11-07 LAB — CMP (CANCER CENTER ONLY)
ALBUMIN: 3.5 g/dL (ref 3.3–5.5)
ALT(SGPT): 55 U/L — ABNORMAL HIGH (ref 10–47)
AST: 40 U/L — ABNORMAL HIGH (ref 11–38)
Alkaline Phosphatase: 89 U/L — ABNORMAL HIGH (ref 26–84)
BUN, Bld: 15 mg/dL (ref 7–22)
CHLORIDE: 108 meq/L (ref 98–108)
CO2: 22 mEq/L (ref 18–33)
CREATININE: 0.9 mg/dL (ref 0.6–1.2)
Calcium: 9.4 mg/dL (ref 8.0–10.3)
Glucose, Bld: 158 mg/dL — ABNORMAL HIGH (ref 73–118)
POTASSIUM: 3.5 meq/L (ref 3.3–4.7)
SODIUM: 143 meq/L (ref 128–145)
Total Bilirubin: 0.5 mg/dl (ref 0.20–1.60)
Total Protein: 7.7 g/dL (ref 6.4–8.1)

## 2014-11-07 LAB — CANCER ANTIGEN 19-9: CA 19-9: 2050.7 U/mL — ABNORMAL HIGH (ref <35.0)

## 2014-11-07 LAB — PREALBUMIN: PREALBUMIN: 27.4 mg/dL (ref 17.0–34.0)

## 2014-11-07 MED ORDER — SODIUM CHLORIDE 0.9 % IV SOLN
Freq: Once | INTRAVENOUS | Status: AC
  Administered 2014-11-07: 14:00:00 via INTRAVENOUS

## 2014-11-07 MED ORDER — ZOLEDRONIC ACID 4 MG/100ML IV SOLN
4.0000 mg | Freq: Once | INTRAVENOUS | Status: DC
  Filled 2014-11-07: qty 100

## 2014-11-07 MED ORDER — DEXAMETHASONE SODIUM PHOSPHATE 10 MG/ML IJ SOLN
INTRAMUSCULAR | Status: AC
  Filled 2014-11-07: qty 1

## 2014-11-07 MED ORDER — HEPARIN SOD (PORK) LOCK FLUSH 100 UNIT/ML IV SOLN
500.0000 [IU] | Freq: Once | INTRAVENOUS | Status: AC
  Administered 2014-11-07: 500 [IU] via INTRAVENOUS
  Filled 2014-11-07: qty 5

## 2014-11-07 MED ORDER — SODIUM CHLORIDE 0.9 % IJ SOLN
10.0000 mL | Freq: Once | INTRAMUSCULAR | Status: AC
  Administered 2014-11-07: 10 mL via INTRAVENOUS
  Filled 2014-11-07: qty 10

## 2014-11-07 MED ORDER — DEXAMETHASONE SODIUM PHOSPHATE 10 MG/ML IJ SOLN
10.0000 mg | Freq: Once | INTRAMUSCULAR | Status: AC
  Administered 2014-11-07: 10 mg via INTRAVENOUS

## 2014-11-07 MED ORDER — SODIUM CHLORIDE 0.9 % IV SOLN
800.0000 mg/m2 | Freq: Once | INTRAVENOUS | Status: AC
  Administered 2014-11-07: 1292 mg via INTRAVENOUS
  Filled 2014-11-07: qty 33.98

## 2014-11-07 MED ORDER — PACLITAXEL PROTEIN-BOUND CHEMO INJECTION 100 MG
100.0000 mg/m2 | Freq: Once | INTRAVENOUS | Status: DC
  Filled 2014-11-07: qty 30

## 2014-11-07 MED ORDER — ONDANSETRON 8 MG/50ML IVPB (CHCC)
8.0000 mg | Freq: Once | INTRAVENOUS | Status: AC
  Administered 2014-11-07: 8 mg via INTRAVENOUS

## 2014-11-07 NOTE — Progress Notes (Signed)
2:49 PM Pt concerned with taking Zometa, Dr. Marin Olp notified, will stop Zometa.

## 2014-11-07 NOTE — Patient Instructions (Signed)
Gemcitabine injection What is this medicine? GEMCITABINE (jem SIT a been) is a chemotherapy drug. This medicine is used to treat many types of cancer like breast cancer, lung cancer, pancreatic cancer, and ovarian cancer. This medicine may be used for other purposes; ask your health care provider or pharmacist if you have questions. COMMON BRAND NAME(S): Gemzar What should I tell my health care provider before I take this medicine? They need to know if you have any of these conditions: -blood disorders -infection -kidney disease -liver disease -recent or ongoing radiation therapy -an unusual or allergic reaction to gemcitabine, other chemotherapy, other medicines, foods, dyes, or preservatives -pregnant or trying to get pregnant -breast-feeding How should I use this medicine? This drug is given as an infusion into a vein. It is administered in a hospital or clinic by a specially trained health care professional. Talk to your pediatrician regarding the use of this medicine in children. Special care may be needed. Overdosage: If you think you have taken too much of this medicine contact a poison control center or emergency room at once. NOTE: This medicine is only for you. Do not share this medicine with others. What if I miss a dose? It is important not to miss your dose. Call your doctor or health care professional if you are unable to keep an appointment. What may interact with this medicine? -medicines to increase blood counts like filgrastim, pegfilgrastim, sargramostim -some other chemotherapy drugs like cisplatin -vaccines Talk to your doctor or health care professional before taking any of these medicines: -acetaminophen -aspirin -ibuprofen -ketoprofen -naproxen This list may not describe all possible interactions. Give your health care provider a list of all the medicines, herbs, non-prescription drugs, or dietary supplements you use. Also tell them if you smoke, drink alcohol,  or use illegal drugs. Some items may interact with your medicine. What should I watch for while using this medicine? Visit your doctor for checks on your progress. This drug may make you feel generally unwell. This is not uncommon, as chemotherapy can affect healthy cells as well as cancer cells. Report any side effects. Continue your course of treatment even though you feel ill unless your doctor tells you to stop. In some cases, you may be given additional medicines to help with side effects. Follow all directions for their use. Call your doctor or health care professional for advice if you get a fever, chills or sore throat, or other symptoms of a cold or flu. Do not treat yourself. This drug decreases your body's ability to fight infections. Try to avoid being around people who are sick. This medicine may increase your risk to bruise or bleed. Call your doctor or health care professional if you notice any unusual bleeding. Be careful brushing and flossing your teeth or using a toothpick because you may get an infection or bleed more easily. If you have any dental work done, tell your dentist you are receiving this medicine. Avoid taking products that contain aspirin, acetaminophen, ibuprofen, naproxen, or ketoprofen unless instructed by your doctor. These medicines may hide a fever. Women should inform their doctor if they wish to become pregnant or think they might be pregnant. There is a potential for serious side effects to an unborn child. Talk to your health care professional or pharmacist for more information. Do not breast-feed an infant while taking this medicine. What side effects may I notice from receiving this medicine? Side effects that you should report to your doctor or health care professional as   soon as possible: -allergic reactions like skin rash, itching or hives, swelling of the face, lips, or tongue -low blood counts - this medicine may decrease the number of white blood cells,  red blood cells and platelets. You may be at increased risk for infections and bleeding. -signs of infection - fever or chills, cough, sore throat, pain or difficulty passing urine -signs of decreased platelets or bleeding - bruising, pinpoint red spots on the skin, black, tarry stools, blood in the urine -signs of decreased red blood cells - unusually weak or tired, fainting spells, lightheadedness -breathing problems -chest pain -mouth sores -nausea and vomiting -pain, swelling, redness at site where injected -pain, tingling, numbness in the hands or feet -stomach pain -swelling of ankles, feet, hands -unusual bleeding Side effects that usually do not require medical attention (report to your doctor or health care professional if they continue or are bothersome): -constipation -diarrhea -hair loss -loss of appetite -stomach upset This list may not describe all possible side effects. Call your doctor for medical advice about side effects. You may report side effects to FDA at 1-800-FDA-1088. Where should I keep my medicine? This drug is given in a hospital or clinic and will not be stored at home. NOTE: This sheet is a summary. It may not cover all possible information. If you have questions about this medicine, talk to your doctor, pharmacist, or health care provider.  2015, [employer] Standard. (2008-02-28 18:45:54) Nanoparticle Albumin-Bound Paclitaxel injection What is this medicine? NANOPARTICLE ALBUMIN-BOUND PACLITAXEL (Na no PAHR ti kuhl al BYOO muhn-bound PAK li TAX el) is a chemotherapy drug. It targets fast dividing cells, like cancer cells, and causes these cells to die. This medicine is used to treat advanced breast cancer and advanced lung cancer. This medicine may be used for other purposes; ask your health care provider or pharmacist if you have questions. COMMON BRAND NAME(S): Abraxane What should I tell my health care provider before I take this medicine? They need  to know if you have any of these conditions: -kidney disease -liver disease -low blood counts, like low platelets, red blood cells, or white blood cells -recent or ongoing radiation therapy -an unusual or allergic reaction to paclitaxel, albumin, other chemotherapy, other medicines, foods, dyes, or preservatives -pregnant or trying to get pregnant -breast-feeding How should I use this medicine? This drug is given as an infusion into a vein. It is administered in a hospital or clinic by a specially trained health care professional. Talk to your pediatrician regarding the use of this medicine in children. Special care may be needed. Overdosage: If you think you have taken too much of this medicine contact a poison control center or emergency room at once. NOTE: This medicine is only for you. Do not share this medicine with others. What if I miss a dose? It is important not to miss your dose. Call your doctor or health care professional if you are unable to keep an appointment. What may interact with this medicine? -cyclosporine -diazepam -ketoconazole -medicines to increase blood counts like filgrastim, pegfilgrastim, sargramostim -other chemotherapy drugs like cisplatin, doxorubicin, epirubicin, etoposide, teniposide, vincristine -quinidine -testosterone -vaccines -verapamil Talk to your doctor or health care professional before taking any of these medicines: -acetaminophen -aspirin -ibuprofen -ketoprofen -naproxen This list may not describe all possible interactions. Give your health care provider a list of all the medicines, herbs, non-prescription drugs, or dietary supplements you use. Also tell them if you smoke, drink alcohol, or use illegal drugs. Some items may  interact with your medicine. What should I watch for while using this medicine? Your condition will be monitored carefully while you are receiving this medicine. You will need important blood work done while you are  taking this medicine. This drug may make you feel generally unwell. This is not uncommon, as chemotherapy can affect healthy cells as well as cancer cells. Report any side effects. Continue your course of treatment even though you feel ill unless your doctor tells you to stop. In some cases, you may be given additional medicines to help with side effects. Follow all directions for their use. Call your doctor or health care professional for advice if you get a fever, chills or sore throat, or other symptoms of a cold or flu. Do not treat yourself. This drug decreases your body's ability to fight infections. Try to avoid being around people who are sick. This medicine may increase your risk to bruise or bleed. Call your doctor or health care professional if you notice any unusual bleeding. Be careful brushing and flossing your teeth or using a toothpick because you may get an infection or bleed more easily. If you have any dental work done, tell your dentist you are receiving this medicine. Avoid taking products that contain aspirin, acetaminophen, ibuprofen, naproxen, or ketoprofen unless instructed by your doctor. These medicines may hide a fever. Do not become pregnant while taking this medicine. Women should inform their doctor if they wish to become pregnant or think they might be pregnant. There is a potential for serious side effects to an unborn child. Talk to your health care professional or pharmacist for more information. Do not breast-feed an infant while taking this medicine. Men are advised not to father a child while receiving this medicine. What side effects may I notice from receiving this medicine? Side effects that you should report to your doctor or health care professional as soon as possible: -allergic reactions like skin rash, itching or hives, swelling of the face, lips, or tongue -low blood counts - This drug may decrease the number of white blood cells, red blood cells and  platelets. You may be at increased risk for infections and bleeding. -signs of infection - fever or chills, cough, sore throat, pain or difficulty passing urine -signs of decreased platelets or bleeding - bruising, pinpoint red spots on the skin, black, tarry stools, nosebleeds -signs of decreased red blood cells - unusually weak or tired, fainting spells, lightheadedness -breathing problems -changes in vision -chest pain -high or low blood pressure -mouth sores -nausea and vomiting -pain, swelling, redness or irritation at the injection site -pain, tingling, numbness in the hands or feet -slow or irregular heartbeat -swelling of the ankle, feet, hands Side effects that usually do not require medical attention (report to your doctor or health care professional if they continue or are bothersome): -aches, pains -changes in the color of fingernails -diarrhea -hair loss -loss of appetite This list may not describe all possible side effects. Call your doctor for medical advice about side effects. You may report side effects to FDA at 1-800-FDA-1088. Where should I keep my medicine? This drug is given in a hospital or clinic and will not be stored at home. NOTE: This sheet is a summary. It may not cover all possible information. If you have questions about this medicine, talk to your doctor, pharmacist, or health care provider.  2015, [employer]/Gold Standard. (2012-12-12 16:48:50)

## 2014-11-14 ENCOUNTER — Ambulatory Visit (HOSPITAL_BASED_OUTPATIENT_CLINIC_OR_DEPARTMENT_OTHER): Payer: Medicare Other

## 2014-11-14 ENCOUNTER — Other Ambulatory Visit: Payer: Medicare Other | Admitting: Lab

## 2014-11-14 DIAGNOSIS — K8689 Other specified diseases of pancreas: Secondary | ICD-10-CM

## 2014-11-14 DIAGNOSIS — C7800 Secondary malignant neoplasm of unspecified lung: Secondary | ICD-10-CM

## 2014-11-14 DIAGNOSIS — Z5111 Encounter for antineoplastic chemotherapy: Secondary | ICD-10-CM

## 2014-11-14 DIAGNOSIS — C250 Malignant neoplasm of head of pancreas: Secondary | ICD-10-CM

## 2014-11-14 LAB — CMP (CANCER CENTER ONLY)
ALT: 72 U/L — AB (ref 10–47)
AST: 52 U/L — AB (ref 11–38)
Albumin: 3.4 g/dL (ref 3.3–5.5)
Alkaline Phosphatase: 93 U/L — ABNORMAL HIGH (ref 26–84)
BILIRUBIN TOTAL: 0.5 mg/dL (ref 0.20–1.60)
BUN, Bld: 10 mg/dL (ref 7–22)
CO2: 23 mEq/L (ref 18–33)
Calcium: 9.1 mg/dL (ref 8.0–10.3)
Chloride: 102 mEq/L (ref 98–108)
Creat: 0.8 mg/dl (ref 0.6–1.2)
Glucose, Bld: 154 mg/dL — ABNORMAL HIGH (ref 73–118)
Potassium: 3.4 mEq/L (ref 3.3–4.7)
SODIUM: 138 meq/L (ref 128–145)
TOTAL PROTEIN: 7.6 g/dL (ref 6.4–8.1)

## 2014-11-14 LAB — CBC WITH DIFFERENTIAL (CANCER CENTER ONLY)
BASO#: 0 10*3/uL (ref 0.0–0.2)
BASO%: 0.3 % (ref 0.0–2.0)
EOS%: 0.3 % (ref 0.0–7.0)
Eosinophils Absolute: 0 10*3/uL (ref 0.0–0.5)
HEMATOCRIT: 34.8 % (ref 34.8–46.6)
HEMOGLOBIN: 11.2 g/dL — AB (ref 11.6–15.9)
LYMPH#: 3.4 10*3/uL — AB (ref 0.9–3.3)
LYMPH%: 44.9 % (ref 14.0–48.0)
MCH: 24.9 pg — ABNORMAL LOW (ref 26.0–34.0)
MCHC: 32.2 g/dL (ref 32.0–36.0)
MCV: 77 fL — ABNORMAL LOW (ref 81–101)
MONO#: 0.5 10*3/uL (ref 0.1–0.9)
MONO%: 6.6 % (ref 0.0–13.0)
NEUT%: 47.9 % (ref 39.6–80.0)
NEUTROS ABS: 3.6 10*3/uL (ref 1.5–6.5)
Platelets: 235 10*3/uL (ref 145–400)
RBC: 4.5 10*6/uL (ref 3.70–5.32)
RDW: 17 % — ABNORMAL HIGH (ref 11.1–15.7)
WBC: 7.6 10*3/uL (ref 3.9–10.0)

## 2014-11-14 LAB — CANCER ANTIGEN 19-9: CA 19-9: 1600 U/mL — ABNORMAL HIGH (ref <35.0)

## 2014-11-14 LAB — PREALBUMIN: PREALBUMIN: 28.9 mg/dL (ref 17.0–34.0)

## 2014-11-14 MED ORDER — HEPARIN SOD (PORK) LOCK FLUSH 100 UNIT/ML IV SOLN
500.0000 [IU] | Freq: Once | INTRAVENOUS | Status: AC | PRN
  Administered 2014-11-14: 500 [IU]
  Filled 2014-11-14: qty 5

## 2014-11-14 MED ORDER — PACLITAXEL PROTEIN-BOUND CHEMO INJECTION 100 MG
100.0000 mg/m2 | Freq: Once | INTRAVENOUS | Status: AC
  Administered 2014-11-14: 150 mg via INTRAVENOUS
  Filled 2014-11-14: qty 30

## 2014-11-14 MED ORDER — ONDANSETRON 8 MG/50ML IVPB (CHCC)
8.0000 mg | Freq: Once | INTRAVENOUS | Status: AC
Start: 2014-11-14 — End: 2014-11-14
  Administered 2014-11-14: 8 mg via INTRAVENOUS

## 2014-11-14 MED ORDER — DEXAMETHASONE SODIUM PHOSPHATE 10 MG/ML IJ SOLN
10.0000 mg | Freq: Once | INTRAMUSCULAR | Status: AC
  Administered 2014-11-14: 10 mg via INTRAVENOUS

## 2014-11-14 MED ORDER — SODIUM CHLORIDE 0.9 % IV SOLN
Freq: Once | INTRAVENOUS | Status: AC
  Administered 2014-11-14: 10:00:00 via INTRAVENOUS

## 2014-11-14 MED ORDER — SODIUM CHLORIDE 0.9 % IV SOLN
800.0000 mg/m2 | Freq: Once | INTRAVENOUS | Status: AC
  Administered 2014-11-14: 1292 mg via INTRAVENOUS
  Filled 2014-11-14: qty 26.3

## 2014-11-14 MED ORDER — DEXAMETHASONE SODIUM PHOSPHATE 10 MG/ML IJ SOLN
INTRAMUSCULAR | Status: AC
  Filled 2014-11-14: qty 1

## 2014-11-14 MED ORDER — SODIUM CHLORIDE 0.9 % IJ SOLN
10.0000 mL | INTRAMUSCULAR | Status: DC | PRN
  Administered 2014-11-14: 10 mL
  Filled 2014-11-14: qty 10

## 2014-11-14 NOTE — Patient Instructions (Signed)
Gemcitabine injection What is this medicine? GEMCITABINE (jem SIT a been) is a chemotherapy drug. This medicine is used to treat many types of cancer like breast cancer, lung cancer, pancreatic cancer, and ovarian cancer. This medicine may be used for other purposes; ask your health care provider or pharmacist if you have questions. COMMON BRAND NAME(S): Gemzar What should I tell my health care provider before I take this medicine? They need to know if you have any of these conditions: -blood disorders -infection -kidney disease -liver disease -recent or ongoing radiation therapy -an unusual or allergic reaction to gemcitabine, other chemotherapy, other medicines, foods, dyes, or preservatives -pregnant or trying to get pregnant -breast-feeding How should I use this medicine? This drug is given as an infusion into a vein. It is administered in a hospital or clinic by a specially trained health care professional. Talk to your pediatrician regarding the use of this medicine in children. Special care may be needed. Overdosage: If you think you have taken too much of this medicine contact a poison control center or emergency room at once. NOTE: This medicine is only for you. Do not share this medicine with others. What if I miss a dose? It is important not to miss your dose. Call your doctor or health care professional if you are unable to keep an appointment. What may interact with this medicine? -medicines to increase blood counts like filgrastim, pegfilgrastim, sargramostim -some other chemotherapy drugs like cisplatin -vaccines Talk to your doctor or health care professional before taking any of these medicines: -acetaminophen -aspirin -ibuprofen -ketoprofen -naproxen This list may not describe all possible interactions. Give your health care provider a list of all the medicines, herbs, non-prescription drugs, or dietary supplements you use. Also tell them if you smoke, drink alcohol,  or use illegal drugs. Some items may interact with your medicine. What should I watch for while using this medicine? Visit your doctor for checks on your progress. This drug may make you feel generally unwell. This is not uncommon, as chemotherapy can affect healthy cells as well as cancer cells. Report any side effects. Continue your course of treatment even though you feel ill unless your doctor tells you to stop. In some cases, you may be given additional medicines to help with side effects. Follow all directions for their use. Call your doctor or health care professional for advice if you get a fever, chills or sore throat, or other symptoms of a cold or flu. Do not treat yourself. This drug decreases your body's ability to fight infections. Try to avoid being around people who are sick. This medicine may increase your risk to bruise or bleed. Call your doctor or health care professional if you notice any unusual bleeding. Be careful brushing and flossing your teeth or using a toothpick because you may get an infection or bleed more easily. If you have any dental work done, tell your dentist you are receiving this medicine. Avoid taking products that contain aspirin, acetaminophen, ibuprofen, naproxen, or ketoprofen unless instructed by your doctor. These medicines may hide a fever. Women should inform their doctor if they wish to become pregnant or think they might be pregnant. There is a potential for serious side effects to an unborn child. Talk to your health care professional or pharmacist for more information. Do not breast-feed an infant while taking this medicine. What side effects may I notice from receiving this medicine? Side effects that you should report to your doctor or health care professional as   soon as possible: -allergic reactions like skin rash, itching or hives, swelling of the face, lips, or tongue -low blood counts - this medicine may decrease the number of white blood cells,  red blood cells and platelets. You may be at increased risk for infections and bleeding. -signs of infection - fever or chills, cough, sore throat, pain or difficulty passing urine -signs of decreased platelets or bleeding - bruising, pinpoint red spots on the skin, black, tarry stools, blood in the urine -signs of decreased red blood cells - unusually weak or tired, fainting spells, lightheadedness -breathing problems -chest pain -mouth sores -nausea and vomiting -pain, swelling, redness at site where injected -pain, tingling, numbness in the hands or feet -stomach pain -swelling of ankles, feet, hands -unusual bleeding Side effects that usually do not require medical attention (report to your doctor or health care professional if they continue or are bothersome): -constipation -diarrhea -hair loss -loss of appetite -stomach upset This list may not describe all possible side effects. Call your doctor for medical advice about side effects. You may report side effects to FDA at 1-800-FDA-1088. Where should I keep my medicine? This drug is given in a hospital or clinic and will not be stored at home. NOTE: This sheet is a summary. It may not cover all possible information. If you have questions about this medicine, talk to your doctor, pharmacist, or health care provider.  2015, [employer] Standard. (2008-02-28 18:45:54) Nanoparticle Albumin-Bound Paclitaxel injection What is this medicine? NANOPARTICLE ALBUMIN-BOUND PACLITAXEL (Na no PAHR ti kuhl al BYOO muhn-bound PAK li TAX el) is a chemotherapy drug. It targets fast dividing cells, like cancer cells, and causes these cells to die. This medicine is used to treat advanced breast cancer and advanced lung cancer. This medicine may be used for other purposes; ask your health care provider or pharmacist if you have questions. COMMON BRAND NAME(S): Abraxane What should I tell my health care provider before I take this medicine? They need  to know if you have any of these conditions: -kidney disease -liver disease -low blood counts, like low platelets, red blood cells, or white blood cells -recent or ongoing radiation therapy -an unusual or allergic reaction to paclitaxel, albumin, other chemotherapy, other medicines, foods, dyes, or preservatives -pregnant or trying to get pregnant -breast-feeding How should I use this medicine? This drug is given as an infusion into a vein. It is administered in a hospital or clinic by a specially trained health care professional. Talk to your pediatrician regarding the use of this medicine in children. Special care may be needed. Overdosage: If you think you have taken too much of this medicine contact a poison control center or emergency room at once. NOTE: This medicine is only for you. Do not share this medicine with others. What if I miss a dose? It is important not to miss your dose. Call your doctor or health care professional if you are unable to keep an appointment. What may interact with this medicine? -cyclosporine -diazepam -ketoconazole -medicines to increase blood counts like filgrastim, pegfilgrastim, sargramostim -other chemotherapy drugs like cisplatin, doxorubicin, epirubicin, etoposide, teniposide, vincristine -quinidine -testosterone -vaccines -verapamil Talk to your doctor or health care professional before taking any of these medicines: -acetaminophen -aspirin -ibuprofen -ketoprofen -naproxen This list may not describe all possible interactions. Give your health care provider a list of all the medicines, herbs, non-prescription drugs, or dietary supplements you use. Also tell them if you smoke, drink alcohol, or use illegal drugs. Some items may  interact with your medicine. What should I watch for while using this medicine? Your condition will be monitored carefully while you are receiving this medicine. You will need important blood work done while you are  taking this medicine. This drug may make you feel generally unwell. This is not uncommon, as chemotherapy can affect healthy cells as well as cancer cells. Report any side effects. Continue your course of treatment even though you feel ill unless your doctor tells you to stop. In some cases, you may be given additional medicines to help with side effects. Follow all directions for their use. Call your doctor or health care professional for advice if you get a fever, chills or sore throat, or other symptoms of a cold or flu. Do not treat yourself. This drug decreases your body's ability to fight infections. Try to avoid being around people who are sick. This medicine may increase your risk to bruise or bleed. Call your doctor or health care professional if you notice any unusual bleeding. Be careful brushing and flossing your teeth or using a toothpick because you may get an infection or bleed more easily. If you have any dental work done, tell your dentist you are receiving this medicine. Avoid taking products that contain aspirin, acetaminophen, ibuprofen, naproxen, or ketoprofen unless instructed by your doctor. These medicines may hide a fever. Do not become pregnant while taking this medicine. Women should inform their doctor if they wish to become pregnant or think they might be pregnant. There is a potential for serious side effects to an unborn child. Talk to your health care professional or pharmacist for more information. Do not breast-feed an infant while taking this medicine. Men are advised not to father a child while receiving this medicine. What side effects may I notice from receiving this medicine? Side effects that you should report to your doctor or health care professional as soon as possible: -allergic reactions like skin rash, itching or hives, swelling of the face, lips, or tongue -low blood counts - This drug may decrease the number of white blood cells, red blood cells and  platelets. You may be at increased risk for infections and bleeding. -signs of infection - fever or chills, cough, sore throat, pain or difficulty passing urine -signs of decreased platelets or bleeding - bruising, pinpoint red spots on the skin, black, tarry stools, nosebleeds -signs of decreased red blood cells - unusually weak or tired, fainting spells, lightheadedness -breathing problems -changes in vision -chest pain -high or low blood pressure -mouth sores -nausea and vomiting -pain, swelling, redness or irritation at the injection site -pain, tingling, numbness in the hands or feet -slow or irregular heartbeat -swelling of the ankle, feet, hands Side effects that usually do not require medical attention (report to your doctor or health care professional if they continue or are bothersome): -aches, pains -changes in the color of fingernails -diarrhea -hair loss -loss of appetite This list may not describe all possible side effects. Call your doctor for medical advice about side effects. You may report side effects to FDA at 1-800-FDA-1088. Where should I keep my medicine? This drug is given in a hospital or clinic and will not be stored at home. NOTE: This sheet is a summary. It may not cover all possible information. If you have questions about this medicine, talk to your doctor, pharmacist, or health care provider.  2015, [employer]/Gold Standard. (2012-12-12 16:48:50)

## 2014-11-21 ENCOUNTER — Encounter (HOSPITAL_BASED_OUTPATIENT_CLINIC_OR_DEPARTMENT_OTHER): Payer: Self-pay

## 2014-11-21 ENCOUNTER — Ambulatory Visit (HOSPITAL_BASED_OUTPATIENT_CLINIC_OR_DEPARTMENT_OTHER)
Admission: RE | Admit: 2014-11-21 | Discharge: 2014-11-21 | Disposition: A | Discharge status: Home / Self Care | Payer: Medicare Other | Source: Ambulatory Visit | Attending: Family | Admitting: Family

## 2014-11-21 ENCOUNTER — Telehealth: Payer: Self-pay | Admitting: *Deleted

## 2014-11-21 DIAGNOSIS — C259 Malignant neoplasm of pancreas, unspecified: Secondary | ICD-10-CM | POA: Diagnosis not present

## 2014-11-21 DIAGNOSIS — Z08 Encounter for follow-up examination after completed treatment for malignant neoplasm: Secondary | ICD-10-CM | POA: Diagnosis not present

## 2014-11-21 DIAGNOSIS — C7800 Secondary malignant neoplasm of unspecified lung: Principal | ICD-10-CM

## 2014-11-21 DIAGNOSIS — D1803 Hemangioma of intra-abdominal structures: Secondary | ICD-10-CM | POA: Insufficient documentation

## 2014-11-21 MED ORDER — IOHEXOL 300 MG/ML  SOLN
100.0000 mL | Freq: Once | INTRAMUSCULAR | Status: AC | PRN
  Administered 2014-11-21: 100 mL via INTRAVENOUS

## 2014-11-21 NOTE — Telephone Encounter (Addendum)
Patient aware and thankful  ----- Message from [name], MD sent at 11/21/2014  1:23 PM EST ----- Call - cancer is stable there is no growth!!  This is good!!  pete

## 2014-11-28 ENCOUNTER — Other Ambulatory Visit (HOSPITAL_BASED_OUTPATIENT_CLINIC_OR_DEPARTMENT_OTHER): Payer: Medicare Other | Admitting: Lab

## 2014-11-28 ENCOUNTER — Ambulatory Visit (HOSPITAL_BASED_OUTPATIENT_CLINIC_OR_DEPARTMENT_OTHER): Payer: Medicare Other

## 2014-11-28 ENCOUNTER — Ambulatory Visit (HOSPITAL_BASED_OUTPATIENT_CLINIC_OR_DEPARTMENT_OTHER): Payer: Medicare Other | Admitting: Hematology & Oncology

## 2014-11-28 ENCOUNTER — Encounter: Payer: Self-pay | Admitting: Hematology & Oncology

## 2014-11-28 VITALS — BP 156/94 | HR 98 | Temp 98.1°F | Resp 14 | Ht 60.0 in | Wt 137.0 lb

## 2014-11-28 DIAGNOSIS — C250 Malignant neoplasm of head of pancreas: Secondary | ICD-10-CM

## 2014-11-28 DIAGNOSIS — C7800 Secondary malignant neoplasm of unspecified lung: Secondary | ICD-10-CM

## 2014-11-28 DIAGNOSIS — C259 Malignant neoplasm of pancreas, unspecified: Secondary | ICD-10-CM

## 2014-11-28 DIAGNOSIS — Z5111 Encounter for antineoplastic chemotherapy: Secondary | ICD-10-CM

## 2014-11-28 LAB — CMP (CANCER CENTER ONLY)
ALK PHOS: 85 U/L — AB (ref 26–84)
ALT(SGPT): 61 U/L — ABNORMAL HIGH (ref 10–47)
AST: 48 U/L — ABNORMAL HIGH (ref 11–38)
Albumin: 3.4 g/dL (ref 3.3–5.5)
BUN, Bld: 16 mg/dL (ref 7–22)
CALCIUM: 9.4 mg/dL (ref 8.0–10.3)
CO2: 21 mEq/L (ref 18–33)
CREATININE: 0.7 mg/dL (ref 0.6–1.2)
Chloride: 104 mEq/L (ref 98–108)
GLUCOSE: 140 mg/dL — AB (ref 73–118)
POTASSIUM: 3.5 meq/L (ref 3.3–4.7)
Sodium: 141 mEq/L (ref 128–145)
TOTAL PROTEIN: 7.6 g/dL (ref 6.4–8.1)
Total Bilirubin: 0.4 mg/dl (ref 0.20–1.60)

## 2014-11-28 LAB — TECHNOLOGIST REVIEW CHCC SATELLITE

## 2014-11-28 LAB — CBC WITH DIFFERENTIAL (CANCER CENTER ONLY)
BASO#: 0.1 10*3/uL (ref 0.0–0.2)
BASO%: 0.4 % (ref 0.0–2.0)
EOS ABS: 0.2 10*3/uL (ref 0.0–0.5)
EOS%: 1.4 % (ref 0.0–7.0)
HCT: 36.4 % (ref 34.8–46.6)
HGB: 11.8 g/dL (ref 11.6–15.9)
LYMPH#: 4.3 10*3/uL — ABNORMAL HIGH (ref 0.9–3.3)
LYMPH%: 32.1 % (ref 14.0–48.0)
MCH: 24.9 pg — AB (ref 26.0–34.0)
MCHC: 32.4 g/dL (ref 32.0–36.0)
MCV: 77 fL — AB (ref 81–101)
MONO#: 2.3 10*3/uL — ABNORMAL HIGH (ref 0.1–0.9)
MONO%: 16.7 % — ABNORMAL HIGH (ref 0.0–13.0)
NEUT#: 6.7 10*3/uL — ABNORMAL HIGH (ref 1.5–6.5)
NEUT%: 49.4 % (ref 39.6–80.0)
Platelets: 543 10*3/uL — ABNORMAL HIGH (ref 145–400)
RBC: 4.74 10*6/uL (ref 3.70–5.32)
RDW: 17.6 % — ABNORMAL HIGH (ref 11.1–15.7)
WBC: 13.5 10*3/uL — AB (ref 3.9–10.0)

## 2014-11-28 MED ORDER — DEXAMETHASONE SODIUM PHOSPHATE 10 MG/ML IJ SOLN
INTRAMUSCULAR | Status: AC
  Filled 2014-11-28: qty 1

## 2014-11-28 MED ORDER — SODIUM CHLORIDE 0.9 % IV SOLN
Freq: Once | INTRAVENOUS | Status: AC
  Administered 2014-11-28: 14:00:00 via INTRAVENOUS

## 2014-11-28 MED ORDER — SODIUM CHLORIDE 0.9 % IV SOLN
800.0000 mg/m2 | Freq: Once | INTRAVENOUS | Status: AC
  Administered 2014-11-28: 1292 mg via INTRAVENOUS
  Filled 2014-11-28: qty 33.98

## 2014-11-28 MED ORDER — SODIUM CHLORIDE 0.9 % IJ SOLN
10.0000 mL | INTRAMUSCULAR | Status: DC | PRN
  Administered 2014-11-28: 10 mL
  Filled 2014-11-28: qty 10

## 2014-11-28 MED ORDER — ONDANSETRON 8 MG/50ML IVPB (CHCC)
8.0000 mg | Freq: Once | INTRAVENOUS | Status: AC
  Administered 2014-11-28: 8 mg via INTRAVENOUS

## 2014-11-28 MED ORDER — DEXAMETHASONE SODIUM PHOSPHATE 10 MG/ML IJ SOLN
10.0000 mg | Freq: Once | INTRAMUSCULAR | Status: AC
  Administered 2014-11-28: 10 mg via INTRAVENOUS

## 2014-11-28 MED ORDER — HEPARIN SOD (PORK) LOCK FLUSH 100 UNIT/ML IV SOLN
500.0000 [IU] | Freq: Once | INTRAVENOUS | Status: AC | PRN
  Administered 2014-11-28: 500 [IU]
  Filled 2014-11-28: qty 5

## 2014-11-28 MED ORDER — PACLITAXEL PROTEIN-BOUND CHEMO INJECTION 100 MG
100.0000 mg/m2 | Freq: Once | INTRAVENOUS | Status: AC
  Administered 2014-11-28: 150 mg via INTRAVENOUS
  Filled 2014-11-28: qty 30

## 2014-11-28 NOTE — Patient Instructions (Signed)
Gemcitabine injection What is this medicine? GEMCITABINE (jem SIT a been) is a chemotherapy drug. This medicine is used to treat many types of cancer like breast cancer, lung cancer, pancreatic cancer, and ovarian cancer. This medicine may be used for other purposes; ask your health care provider or pharmacist if you have questions. COMMON BRAND NAME(S): Gemzar What should I tell my health care provider before I take this medicine? They need to know if you have any of these conditions: -blood disorders -infection -kidney disease -liver disease -recent or ongoing radiation therapy -an unusual or allergic reaction to gemcitabine, other chemotherapy, other medicines, foods, dyes, or preservatives -pregnant or trying to get pregnant -breast-feeding How should I use this medicine? This drug is given as an infusion into a vein. It is administered in a hospital or clinic by a specially trained health care professional. Talk to your pediatrician regarding the use of this medicine in children. Special care may be needed. Overdosage: If you think you have taken too much of this medicine contact a poison control center or emergency room at once. NOTE: This medicine is only for you. Do not share this medicine with others. What if I miss a dose? It is important not to miss your dose. Call your doctor or health care professional if you are unable to keep an appointment. What may interact with this medicine? -medicines to increase blood counts like filgrastim, pegfilgrastim, sargramostim -some other chemotherapy drugs like cisplatin -vaccines Talk to your doctor or health care professional before taking any of these medicines: -acetaminophen -aspirin -ibuprofen -ketoprofen -naproxen This list may not describe all possible interactions. Give your health care provider a list of all the medicines, herbs, non-prescription drugs, or dietary supplements you use. Also tell them if you smoke, drink alcohol,  or use illegal drugs. Some items may interact with your medicine. What should I watch for while using this medicine? Visit your doctor for checks on your progress. This drug may make you feel generally unwell. This is not uncommon, as chemotherapy can affect healthy cells as well as cancer cells. Report any side effects. Continue your course of treatment even though you feel ill unless your doctor tells you to stop. In some cases, you may be given additional medicines to help with side effects. Follow all directions for their use. Call your doctor or health care professional for advice if you get a fever, chills or sore throat, or other symptoms of a cold or flu. Do not treat yourself. This drug decreases your body's ability to fight infections. Try to avoid being around people who are sick. This medicine may increase your risk to bruise or bleed. Call your doctor or health care professional if you notice any unusual bleeding. Be careful brushing and flossing your teeth or using a toothpick because you may get an infection or bleed more easily. If you have any dental work done, tell your dentist you are receiving this medicine. Avoid taking products that contain aspirin, acetaminophen, ibuprofen, naproxen, or ketoprofen unless instructed by your doctor. These medicines may hide a fever. Women should inform their doctor if they wish to become pregnant or think they might be pregnant. There is a potential for serious side effects to an unborn child. Talk to your health care professional or pharmacist for more information. Do not breast-feed an infant while taking this medicine. What side effects may I notice from receiving this medicine? Side effects that you should report to your doctor or health care professional as   soon as possible: -allergic reactions like skin rash, itching or hives, swelling of the face, lips, or tongue -low blood counts - this medicine may decrease the number of white blood cells,  red blood cells and platelets. You may be at increased risk for infections and bleeding. -signs of infection - fever or chills, cough, sore throat, pain or difficulty passing urine -signs of decreased platelets or bleeding - bruising, pinpoint red spots on the skin, black, tarry stools, blood in the urine -signs of decreased red blood cells - unusually weak or tired, fainting spells, lightheadedness -breathing problems -chest pain -mouth sores -nausea and vomiting -pain, swelling, redness at site where injected -pain, tingling, numbness in the hands or feet -stomach pain -swelling of ankles, feet, hands -unusual bleeding Side effects that usually do not require medical attention (report to your doctor or health care professional if they continue or are bothersome): -constipation -diarrhea -hair loss -loss of appetite -stomach upset This list may not describe all possible side effects. Call your doctor for medical advice about side effects. You may report side effects to FDA at 1-800-FDA-1088. Where should I keep my medicine? This drug is given in a hospital or clinic and will not be stored at home. NOTE: This sheet is a summary. It may not cover all possible information. If you have questions about this medicine, talk to your doctor, pharmacist, or health care provider.  2015, [employer] Standard. (2008-02-28 18:45:54) Nanoparticle Albumin-Bound Paclitaxel injection What is this medicine? NANOPARTICLE ALBUMIN-BOUND PACLITAXEL (Na no PAHR ti kuhl al BYOO muhn-bound PAK li TAX el) is a chemotherapy drug. It targets fast dividing cells, like cancer cells, and causes these cells to die. This medicine is used to treat advanced breast cancer and advanced lung cancer. This medicine may be used for other purposes; ask your health care provider or pharmacist if you have questions. COMMON BRAND NAME(S): Abraxane What should I tell my health care provider before I take this medicine? They need  to know if you have any of these conditions: -kidney disease -liver disease -low blood counts, like low platelets, red blood cells, or white blood cells -recent or ongoing radiation therapy -an unusual or allergic reaction to paclitaxel, albumin, other chemotherapy, other medicines, foods, dyes, or preservatives -pregnant or trying to get pregnant -breast-feeding How should I use this medicine? This drug is given as an infusion into a vein. It is administered in a hospital or clinic by a specially trained health care professional. Talk to your pediatrician regarding the use of this medicine in children. Special care may be needed. Overdosage: If you think you have taken too much of this medicine contact a poison control center or emergency room at once. NOTE: This medicine is only for you. Do not share this medicine with others. What if I miss a dose? It is important not to miss your dose. Call your doctor or health care professional if you are unable to keep an appointment. What may interact with this medicine? -cyclosporine -diazepam -ketoconazole -medicines to increase blood counts like filgrastim, pegfilgrastim, sargramostim -other chemotherapy drugs like cisplatin, doxorubicin, epirubicin, etoposide, teniposide, vincristine -quinidine -testosterone -vaccines -verapamil Talk to your doctor or health care professional before taking any of these medicines: -acetaminophen -aspirin -ibuprofen -ketoprofen -naproxen This list may not describe all possible interactions. Give your health care provider a list of all the medicines, herbs, non-prescription drugs, or dietary supplements you use. Also tell them if you smoke, drink alcohol, or use illegal drugs. Some items may  interact with your medicine. What should I watch for while using this medicine? Your condition will be monitored carefully while you are receiving this medicine. You will need important blood work done while you are  taking this medicine. This drug may make you feel generally unwell. This is not uncommon, as chemotherapy can affect healthy cells as well as cancer cells. Report any side effects. Continue your course of treatment even though you feel ill unless your doctor tells you to stop. In some cases, you may be given additional medicines to help with side effects. Follow all directions for their use. Call your doctor or health care professional for advice if you get a fever, chills or sore throat, or other symptoms of a cold or flu. Do not treat yourself. This drug decreases your body's ability to fight infections. Try to avoid being around people who are sick. This medicine may increase your risk to bruise or bleed. Call your doctor or health care professional if you notice any unusual bleeding. Be careful brushing and flossing your teeth or using a toothpick because you may get an infection or bleed more easily. If you have any dental work done, tell your dentist you are receiving this medicine. Avoid taking products that contain aspirin, acetaminophen, ibuprofen, naproxen, or ketoprofen unless instructed by your doctor. These medicines may hide a fever. Do not become pregnant while taking this medicine. Women should inform their doctor if they wish to become pregnant or think they might be pregnant. There is a potential for serious side effects to an unborn child. Talk to your health care professional or pharmacist for more information. Do not breast-feed an infant while taking this medicine. Men are advised not to father a child while receiving this medicine. What side effects may I notice from receiving this medicine? Side effects that you should report to your doctor or health care professional as soon as possible: -allergic reactions like skin rash, itching or hives, swelling of the face, lips, or tongue -low blood counts - This drug may decrease the number of white blood cells, red blood cells and  platelets. You may be at increased risk for infections and bleeding. -signs of infection - fever or chills, cough, sore throat, pain or difficulty passing urine -signs of decreased platelets or bleeding - bruising, pinpoint red spots on the skin, black, tarry stools, nosebleeds -signs of decreased red blood cells - unusually weak or tired, fainting spells, lightheadedness -breathing problems -changes in vision -chest pain -high or low blood pressure -mouth sores -nausea and vomiting -pain, swelling, redness or irritation at the injection site -pain, tingling, numbness in the hands or feet -slow or irregular heartbeat -swelling of the ankle, feet, hands Side effects that usually do not require medical attention (report to your doctor or health care professional if they continue or are bothersome): -aches, pains -changes in the color of fingernails -diarrhea -hair loss -loss of appetite This list may not describe all possible side effects. Call your doctor for medical advice about side effects. You may report side effects to FDA at 1-800-FDA-1088. Where should I keep my medicine? This drug is given in a hospital or clinic and will not be stored at home. NOTE: This sheet is a summary. It may not cover all possible information. If you have questions about this medicine, talk to your doctor, pharmacist, or health care provider.  2015, [employer]/Gold Standard. (2012-12-12 16:48:50)

## 2014-11-28 NOTE — Progress Notes (Signed)
Hematology and Oncology Follow Up Visit  Jacqueline Hahn [id number] 04/11/1946 69 y.o. 11/28/2014   Principle Diagnosis:   Metastatic adenocarcinoma of the pancreas  Current Therapy:    Status post 2 cycles of gemcitabine/Abraxane  Zometa 4 mg IV every month       Interim History:  Ms.  Hahn is back for follow-up. She had a CT scan done last week. Essentially, the CT scan showed stable disease. There is nothing new. She had nothing progressing. A couple lung nodules actually went away.  Her last CA 19-9 was 1600. This is improved. Her CA-19-9-9 does tend to fluctuate.  She is eating okay. Some foods do tend to make a little bit more sick. Patient has had problems with chronic constipation. Again this is not new.   Her blood sugars have gone up. She is on some metformin and glipizide. We will see what her blood sugars are today.   She's had no cough. There's been no shortness of breath. She's had no rashes. There's been no leg swelling.  She is having no issues with pain.  Overall, her performance status is ECOG 1   Medications:  Current outpatient prescriptions:  .  amLODipine (NORVASC) 10 MG tablet, Take 1 tablet (10 mg total) by mouth daily., Disp: 30 tablet, Rfl: 3 .  Calcium Carbonate-Vitamin D (CALCIUM + D PO), Take 1 tablet by mouth daily., Disp: , Rfl:  .  glipiZIDE-metformin (METAGLIP) 5-500 MG per tablet, 1 tablet 2 (two) times daily before a meal. , Disp: , Rfl: 0 .  lidocaine-prilocaine (EMLA) cream, Apply 1 application topically as needed. Apply quarter sized amount to portacath site at least one hour prior to chemo treatment, Disp: 30 g, Rfl: 0 .  lipase/protease/amylase (CREON) 12000 UNITS CPEP capsule, Take 12,000 Units by mouth daily as needed (for upset stomach). , Disp: , Rfl:  .  megestrol (MEGACE) 400 MG/10ML suspension, Take 800 mg by mouth daily., Disp: , Rfl:  .  Multiple Vitamin (MULTIVITAMIN WITH MINERALS) TABS tablet, Take 1 tablet by mouth daily., Disp:  , Rfl:  .  pyridOXINE (VITAMIN B-6) 100 MG tablet, Take 100 mg by mouth daily., Disp: , Rfl:  .  vitamin A 8000 UNIT capsule, Take 8,000 Units by mouth daily., Disp: , Rfl:  .  vitamin E 400 UNIT capsule, Take 400 Units by mouth daily., Disp: , Rfl:  .  diphenoxylate-atropine (LOMOTIL) 2.5-0.025 MG per tablet, Take 1 tablet by mouth every 4 (four) hours as needed for diarrhea or loose stools (max 6 pills per day). (Patient not taking: Reported on 11/28/2014), Disp: 30 tablet, Rfl: 0 .  Flaxseed, Linseed, (FLAX PO), Take 1 tablet by mouth daily., Disp: , Rfl:  .  lansoprazole (PREVACID) 15 MG capsule, Take 15 mg by mouth daily as needed (for heartburn). , Disp: , Rfl:   Allergies: No Known Allergies  Past Medical History, Surgical history, Social history, and Family History were reviewed and updated.  Review of Systems: As above  Physical Exam:  height is 5' (1.524 m) and weight is 137 lb (62.143 kg). Her oral temperature is 98.1 F (36.7 C). Her blood pressure is 156/94 and her pulse is 98. Her respiration is 14.   Well-developed and well-nourished African-American female in no obvious distress. Her head and neck exam shows no ocular or oral lesions. There are no palpable cervical or supraclavicular lymph nodes. Lungs are clear. Cardiac exam regular rate and rhythm with a normal S1 and S2. She has no  murmurs, rubs or bruits. Abdomen is soft. She good bowel sounds. There is no fluid wave. There is no palpable liver or spleen tip. Back exam shows no tenderness over the spine, ribs or hips. Extremities shows no clubbing, cyanosis or edema. She has good range of motion of her joints. She has good strength in her extremities. Skin exam shows no rashes, ecchymoses or petechia. Neurological exam is nonfocal.  Lab Results  Component Value Date   WBC 13.5* 11/28/2014   HGB 11.8 11/28/2014   HCT 36.4 11/28/2014   MCV 77* 11/28/2014   PLT 543* 11/28/2014     Chemistry      Component Value  Date/Time   NA 141 11/28/2014 1158   NA 136* 09/01/2014 1148   K 3.5 11/28/2014 1158   K 3.5* 09/01/2014 1148   CL 104 11/28/2014 1158   CL 102 09/01/2014 1148   CO2 21 11/28/2014 1158   CO2 19 09/01/2014 1148   BUN 16 11/28/2014 1158   BUN 11 09/01/2014 1148   CREATININE 0.7 11/28/2014 1158   CREATININE 0.64 09/01/2014 1148      Component Value Date/Time   CALCIUM 9.4 11/28/2014 1158   CALCIUM 9.3 09/01/2014 1148   ALKPHOS 85* 11/28/2014 1158   ALKPHOS 121* 09/01/2014 1148   AST 48* 11/28/2014 1158   AST 48* 09/01/2014 1148   ALT 61* 11/28/2014 1158   ALT 85* 09/01/2014 1148   BILITOT 0.40 11/28/2014 1158   BILITOT 0.6 09/01/2014 1148         Impression and Plan: Jacqueline Hahn is 69 year old African-American female with metastatic pancreatic cancer. She is on Gemzar/Abraxane. She is responding. Her quality of life is actually quite good.  I'm glad that her CT scan showed that she has stable disease. To me, this is definitely a "victory".  Her quality of life is doing pretty good. She is quite happy with how well she seems to be doing. She's gained some weight.  We will proceed with her next cycle of treatment. I think this be her fifth cycle of treatment.  We'll plan to get her back in one month for a visit.  I spent about 25-30 minutes with her today. I went over the CAT scan results. Short of the CAT scan. I went over her labs.  [name], MD 1/27/20161:25 PM

## 2014-12-05 ENCOUNTER — Ambulatory Visit (HOSPITAL_BASED_OUTPATIENT_CLINIC_OR_DEPARTMENT_OTHER): Payer: Medicare Other

## 2014-12-05 ENCOUNTER — Other Ambulatory Visit (HOSPITAL_BASED_OUTPATIENT_CLINIC_OR_DEPARTMENT_OTHER): Payer: Medicare Other | Admitting: Lab

## 2014-12-05 VITALS — BP 162/77 | HR 96 | Temp 98.1°F | Resp 18

## 2014-12-05 DIAGNOSIS — K8689 Other specified diseases of pancreas: Secondary | ICD-10-CM

## 2014-12-05 DIAGNOSIS — C250 Malignant neoplasm of head of pancreas: Secondary | ICD-10-CM

## 2014-12-05 DIAGNOSIS — C7800 Secondary malignant neoplasm of unspecified lung: Secondary | ICD-10-CM

## 2014-12-05 DIAGNOSIS — C259 Malignant neoplasm of pancreas, unspecified: Secondary | ICD-10-CM

## 2014-12-05 DIAGNOSIS — Z5111 Encounter for antineoplastic chemotherapy: Secondary | ICD-10-CM

## 2014-12-05 LAB — CBC WITH DIFFERENTIAL (CANCER CENTER ONLY)
BASO#: 0 10*3/uL (ref 0.0–0.2)
BASO%: 0.4 % (ref 0.0–2.0)
EOS%: 0.9 % (ref 0.0–7.0)
Eosinophils Absolute: 0.1 10*3/uL (ref 0.0–0.5)
HCT: 35.3 % (ref 34.8–46.6)
HEMOGLOBIN: 11.5 g/dL — AB (ref 11.6–15.9)
LYMPH#: 4.7 10*3/uL — ABNORMAL HIGH (ref 0.9–3.3)
LYMPH%: 41 % (ref 14.0–48.0)
MCH: 25.2 pg — AB (ref 26.0–34.0)
MCHC: 32.6 g/dL (ref 32.0–36.0)
MCV: 77 fL — ABNORMAL LOW (ref 81–101)
MONO#: 1.3 10*3/uL — ABNORMAL HIGH (ref 0.1–0.9)
MONO%: 11.7 % (ref 0.0–13.0)
NEUT#: 5.2 10*3/uL (ref 1.5–6.5)
NEUT%: 46 % (ref 39.6–80.0)
Platelets: 423 10*3/uL — ABNORMAL HIGH (ref 145–400)
RBC: 4.57 10*6/uL (ref 3.70–5.32)
RDW: 17.1 % — ABNORMAL HIGH (ref 11.1–15.7)
WBC: 11.4 10*3/uL — ABNORMAL HIGH (ref 3.9–10.0)

## 2014-12-05 LAB — CMP (CANCER CENTER ONLY)
ALT: 112 U/L — AB (ref 10–47)
AST: 81 U/L — AB (ref 11–38)
Albumin: 3.4 g/dL (ref 3.3–5.5)
Alkaline Phosphatase: 91 U/L — ABNORMAL HIGH (ref 26–84)
BUN: 12 mg/dL (ref 7–22)
CO2: 22 mEq/L (ref 18–33)
CREATININE: 0.8 mg/dL (ref 0.6–1.2)
Calcium: 9.2 mg/dL (ref 8.0–10.3)
Chloride: 104 mEq/L (ref 98–108)
Glucose, Bld: 230 mg/dL — ABNORMAL HIGH (ref 73–118)
Potassium: 3.6 mEq/L (ref 3.3–4.7)
SODIUM: 140 meq/L (ref 128–145)
Total Bilirubin: 0.4 mg/dl (ref 0.20–1.60)
Total Protein: 7.4 g/dL (ref 6.4–8.1)

## 2014-12-05 LAB — CANCER ANTIGEN 19-9: CA 19-9: 484.8 U/mL — ABNORMAL HIGH (ref <35.0)

## 2014-12-05 LAB — TECHNOLOGIST REVIEW CHCC SATELLITE

## 2014-12-05 MED ORDER — HEPARIN SOD (PORK) LOCK FLUSH 100 UNIT/ML IV SOLN
500.0000 [IU] | Freq: Once | INTRAVENOUS | Status: AC | PRN
  Administered 2014-12-05: 500 [IU]
  Filled 2014-12-05: qty 5

## 2014-12-05 MED ORDER — INSULIN REGULAR HUMAN 100 UNIT/ML IJ SOLN: 10.0000 [IU] | Freq: Once | INTRAMUSCULAR | Status: DC

## 2014-12-05 MED ORDER — DEXAMETHASONE SODIUM PHOSPHATE 10 MG/ML IJ SOLN
10.0000 mg | Freq: Once | INTRAMUSCULAR | Status: AC
  Administered 2014-12-05: 10 mg via INTRAVENOUS

## 2014-12-05 MED ORDER — SODIUM CHLORIDE 0.9 % IJ SOLN
10.0000 mL | INTRAMUSCULAR | Status: DC | PRN
  Administered 2014-12-05: 10 mL
  Filled 2014-12-05: qty 10

## 2014-12-05 MED ORDER — ONDANSETRON 8 MG/50ML IVPB (CHCC)
8.0000 mg | Freq: Once | INTRAVENOUS | Status: AC
Start: 2014-12-05 — End: 2014-12-05
  Administered 2014-12-05: 8 mg via INTRAVENOUS

## 2014-12-05 MED ORDER — DEXAMETHASONE SODIUM PHOSPHATE 10 MG/ML IJ SOLN
INTRAMUSCULAR | Status: AC
  Filled 2014-12-05: qty 1

## 2014-12-05 MED ORDER — SODIUM CHLORIDE 0.9 % IV SOLN
800.0000 mg/m2 | Freq: Once | INTRAVENOUS | Status: AC
  Administered 2014-12-05: 1292 mg via INTRAVENOUS
  Filled 2014-12-05: qty 33.98

## 2014-12-05 MED ORDER — SODIUM CHLORIDE 0.9 % IV SOLN
Freq: Once | INTRAVENOUS | Status: AC
  Administered 2014-12-05: 12:00:00 via INTRAVENOUS

## 2014-12-05 MED ORDER — PACLITAXEL PROTEIN-BOUND CHEMO INJECTION 100 MG
100.0000 mg/m2 | Freq: Once | INTRAVENOUS | Status: AC
  Administered 2014-12-05: 150 mg via INTRAVENOUS
  Filled 2014-12-05: qty 30

## 2014-12-05 NOTE — Patient Instructions (Signed)
Gemcitabine injection What is this medicine? GEMCITABINE (jem SIT a been) is a chemotherapy drug. This medicine is used to treat many types of cancer like breast cancer, lung cancer, pancreatic cancer, and ovarian cancer. This medicine may be used for other purposes; ask your health care provider or pharmacist if you have questions. COMMON BRAND NAME(S): Gemzar What should I tell my health care provider before I take this medicine? They need to know if you have any of these conditions: -blood disorders -infection -kidney disease -liver disease -recent or ongoing radiation therapy -an unusual or allergic reaction to gemcitabine, other chemotherapy, other medicines, foods, dyes, or preservatives -pregnant or trying to get pregnant -breast-feeding How should I use this medicine? This drug is given as an infusion into a vein. It is administered in a hospital or clinic by a specially trained health care professional. Talk to your pediatrician regarding the use of this medicine in children. Special care may be needed. Overdosage: If you think you have taken too much of this medicine contact a poison control center or emergency room at once. NOTE: This medicine is only for you. Do not share this medicine with others. What if I miss a dose? It is important not to miss your dose. Call your doctor or health care professional if you are unable to keep an appointment. What may interact with this medicine? -medicines to increase blood counts like filgrastim, pegfilgrastim, sargramostim -some other chemotherapy drugs like cisplatin -vaccines Talk to your doctor or health care professional before taking any of these medicines: -acetaminophen -aspirin -ibuprofen -ketoprofen -naproxen This list may not describe all possible interactions. Give your health care provider a list of all the medicines, herbs, non-prescription drugs, or dietary supplements you use. Also tell them if you smoke, drink alcohol,  or use illegal drugs. Some items may interact with your medicine. What should I watch for while using this medicine? Visit your doctor for checks on your progress. This drug may make you feel generally unwell. This is not uncommon, as chemotherapy can affect healthy cells as well as cancer cells. Report any side effects. Continue your course of treatment even though you feel ill unless your doctor tells you to stop. In some cases, you may be given additional medicines to help with side effects. Follow all directions for their use. Call your doctor or health care professional for advice if you get a fever, chills or sore throat, or other symptoms of a cold or flu. Do not treat yourself. This drug decreases your body's ability to fight infections. Try to avoid being around people who are sick. This medicine may increase your risk to bruise or bleed. Call your doctor or health care professional if you notice any unusual bleeding. Be careful brushing and flossing your teeth or using a toothpick because you may get an infection or bleed more easily. If you have any dental work done, tell your dentist you are receiving this medicine. Avoid taking products that contain aspirin, acetaminophen, ibuprofen, naproxen, or ketoprofen unless instructed by your doctor. These medicines may hide a fever. Women should inform their doctor if they wish to become pregnant or think they might be pregnant. There is a potential for serious side effects to an unborn child. Talk to your health care professional or pharmacist for more information. Do not breast-feed an infant while taking this medicine. What side effects may I notice from receiving this medicine? Side effects that you should report to your doctor or health care professional as   soon as possible: -allergic reactions like skin rash, itching or hives, swelling of the face, lips, or tongue -low blood counts - this medicine may decrease the number of white blood cells,  red blood cells and platelets. You may be at increased risk for infections and bleeding. -signs of infection - fever or chills, cough, sore throat, pain or difficulty passing urine -signs of decreased platelets or bleeding - bruising, pinpoint red spots on the skin, black, tarry stools, blood in the urine -signs of decreased red blood cells - unusually weak or tired, fainting spells, lightheadedness -breathing problems -chest pain -mouth sores -nausea and vomiting -pain, swelling, redness at site where injected -pain, tingling, numbness in the hands or feet -stomach pain -swelling of ankles, feet, hands -unusual bleeding Side effects that usually do not require medical attention (report to your doctor or health care professional if they continue or are bothersome): -constipation -diarrhea -hair loss -loss of appetite -stomach upset This list may not describe all possible side effects. Call your doctor for medical advice about side effects. You may report side effects to FDA at 1-800-FDA-1088. Where should I keep my medicine? This drug is given in a hospital or clinic and will not be stored at home. NOTE: This sheet is a summary. It may not cover all possible information. If you have questions about this medicine, talk to your doctor, pharmacist, or health care provider.  2015, [employer]. (2008-02-28 18:45:54)   Nanoparticle Albumin-Bound Paclitaxel injection What is this medicine? NANOPARTICLE ALBUMIN-BOUND PACLITAXEL (Na no PAHR ti kuhl al BYOO muhn-bound PAK li TAX el) is a chemotherapy drug. It targets fast dividing cells, like cancer cells, and causes these cells to die. This medicine is used to treat advanced breast cancer and advanced lung cancer. This medicine may be used for other purposes; ask your health care provider or pharmacist if you have questions. COMMON BRAND NAME(S): Abraxane What should I tell my health care provider before I take this medicine? They  need to know if you have any of these conditions: -kidney disease -liver disease -low blood counts, like low platelets, red blood cells, or white blood cells -recent or ongoing radiation therapy -an unusual or allergic reaction to paclitaxel, albumin, other chemotherapy, other medicines, foods, dyes, or preservatives -pregnant or trying to get pregnant -breast-feeding How should I use this medicine? This drug is given as an infusion into a vein. It is administered in a hospital or clinic by a specially trained health care professional. Talk to your pediatrician regarding the use of this medicine in children. Special care may be needed. Overdosage: If you think you have taken too much of this medicine contact a poison control center or emergency room at once. NOTE: This medicine is only for you. Do not share this medicine with others. What if I miss a dose? It is important not to miss your dose. Call your doctor or health care professional if you are unable to keep an appointment. What may interact with this medicine? -cyclosporine -diazepam -ketoconazole -medicines to increase blood counts like filgrastim, pegfilgrastim, sargramostim -other chemotherapy drugs like cisplatin, doxorubicin, epirubicin, etoposide, teniposide, vincristine -quinidine -testosterone -vaccines -verapamil Talk to your doctor or health care professional before taking any of these medicines: -acetaminophen -aspirin -ibuprofen -ketoprofen -naproxen This list may not describe all possible interactions. Give your health care provider a list of all the medicines, herbs, non-prescription drugs, or dietary supplements you use. Also tell them if you smoke, drink alcohol, or use illegal drugs. Some  items may interact with your medicine. What should I watch for while using this medicine? Your condition will be monitored carefully while you are receiving this medicine. You will need important blood work done while you are  taking this medicine. This drug may make you feel generally unwell. This is not uncommon, as chemotherapy can affect healthy cells as well as cancer cells. Report any side effects. Continue your course of treatment even though you feel ill unless your doctor tells you to stop. In some cases, you may be given additional medicines to help with side effects. Follow all directions for their use. Call your doctor or health care professional for advice if you get a fever, chills or sore throat, or other symptoms of a cold or flu. Do not treat yourself. This drug decreases your body's ability to fight infections. Try to avoid being around people who are sick. This medicine may increase your risk to bruise or bleed. Call your doctor or health care professional if you notice any unusual bleeding. Be careful brushing and flossing your teeth or using a toothpick because you may get an infection or bleed more easily. If you have any dental work done, tell your dentist you are receiving this medicine. Avoid taking products that contain aspirin, acetaminophen, ibuprofen, naproxen, or ketoprofen unless instructed by your doctor. These medicines may hide a fever. Do not become pregnant while taking this medicine. Women should inform their doctor if they wish to become pregnant or think they might be pregnant. There is a potential for serious side effects to an unborn child. Talk to your health care professional or pharmacist for more information. Do not breast-feed an infant while taking this medicine. Men are advised not to father a child while receiving this medicine. What side effects may I notice from receiving this medicine? Side effects that you should report to your doctor or health care professional as soon as possible: -allergic reactions like skin rash, itching or hives, swelling of the face, lips, or tongue -low blood counts - This drug may decrease the number of white blood cells, red blood cells and  platelets. You may be at increased risk for infections and bleeding. -signs of infection - fever or chills, cough, sore throat, pain or difficulty passing urine -signs of decreased platelets or bleeding - bruising, pinpoint red spots on the skin, black, tarry stools, nosebleeds -signs of decreased red blood cells - unusually weak or tired, fainting spells, lightheadedness -breathing problems -changes in vision -chest pain -high or low blood pressure -mouth sores Implanted Port Insertion, Care After Refer to this sheet in the next few weeks. These instructions provide you with information on caring for yourself after your procedure. Your health care provider may also give you more specific instructions. Your treatment has been planned according to current medical practices, but problems sometimes occur. Call your health care provider if you have any problems or questions after your procedure. WHAT TO EXPECT AFTER THE PROCEDURE After your procedure, it is typical to have the following:   Discomfort at the port insertion site. Ice packs to the area will help.  Bruising on the skin over the port. This will subside in 3-4 days. HOME CARE INSTRUCTIONS  After your port is placed, you will get a manufacturer's information card. The card has information about your port. Keep this card with you at all times.   Know what kind of port you have. There are many types of ports available.   Wear a medical  alert bracelet in case of an emergency. This can help alert health care workers that you have a port.   The port can stay in for as long as your health care provider believes it is necessary.   A home health care nurse may give medicines and take care of the port.   You or a family member can get special training and directions for giving medicine and taking care of the port at home.  SEEK MEDICAL CARE IF:   Your port does not flush or you are unable to get a blood return.   You have a  fever or chills. SEEK IMMEDIATE MEDICAL CARE IF:  You have new fluid or pus coming from your incision.   You notice a bad smell coming from your incision site.   You have swelling, pain, or more redness at the incision or port site.   You have chest pain or shortness of breath. Document Released: 08/09/2013 Document Revised: 10/24/2013 Document Reviewed: 08/09/2013 [facility] Patient Information 2015 [place], [place]. This information is not intended to replace advice given to you by your health care provider. Make sure you discuss any questions you have with your health care provider.

## 2014-12-11 ENCOUNTER — Other Ambulatory Visit: Payer: Self-pay

## 2014-12-11 DIAGNOSIS — C7800 Secondary malignant neoplasm of unspecified lung: Principal | ICD-10-CM

## 2014-12-11 DIAGNOSIS — C259 Malignant neoplasm of pancreas, unspecified: Secondary | ICD-10-CM

## 2014-12-12 ENCOUNTER — Ambulatory Visit (HOSPITAL_BASED_OUTPATIENT_CLINIC_OR_DEPARTMENT_OTHER): Payer: Medicare Other

## 2014-12-12 ENCOUNTER — Other Ambulatory Visit (HOSPITAL_BASED_OUTPATIENT_CLINIC_OR_DEPARTMENT_OTHER): Payer: Medicare Other | Admitting: Lab

## 2014-12-12 DIAGNOSIS — C259 Malignant neoplasm of pancreas, unspecified: Secondary | ICD-10-CM

## 2014-12-12 DIAGNOSIS — C250 Malignant neoplasm of head of pancreas: Secondary | ICD-10-CM

## 2014-12-12 DIAGNOSIS — Z5111 Encounter for antineoplastic chemotherapy: Secondary | ICD-10-CM

## 2014-12-12 DIAGNOSIS — C7800 Secondary malignant neoplasm of unspecified lung: Principal | ICD-10-CM

## 2014-12-12 DIAGNOSIS — K8689 Other specified diseases of pancreas: Secondary | ICD-10-CM

## 2014-12-12 LAB — CMP (CANCER CENTER ONLY)
ALT(SGPT): 69 U/L — ABNORMAL HIGH (ref 10–47)
AST: 38 U/L (ref 11–38)
Albumin: 3.6 g/dL (ref 3.3–5.5)
Alkaline Phosphatase: 73 U/L (ref 26–84)
BILIRUBIN TOTAL: 0.4 mg/dL (ref 0.20–1.60)
BUN, Bld: 11 mg/dL (ref 7–22)
CO2: 23 meq/L (ref 18–33)
Calcium: 9.5 mg/dL (ref 8.0–10.3)
Chloride: 105 mEq/L (ref 98–108)
Creat: 0.7 mg/dl (ref 0.6–1.2)
Glucose, Bld: 177 mg/dL — ABNORMAL HIGH (ref 73–118)
POTASSIUM: 3.6 meq/L (ref 3.3–4.7)
SODIUM: 142 meq/L (ref 128–145)
Total Protein: 7.3 g/dL (ref 6.4–8.1)

## 2014-12-12 LAB — CBC WITH DIFFERENTIAL (CANCER CENTER ONLY)
BASO#: 0 10*3/uL (ref 0.0–0.2)
BASO%: 0.3 % (ref 0.0–2.0)
EOS%: 0.5 % (ref 0.0–7.0)
Eosinophils Absolute: 0.1 10*3/uL (ref 0.0–0.5)
HCT: 35.2 % (ref 34.8–46.6)
HGB: 11.4 g/dL — ABNORMAL LOW (ref 11.6–15.9)
LYMPH#: 4.3 10*3/uL — ABNORMAL HIGH (ref 0.9–3.3)
LYMPH%: 37.5 % (ref 14.0–48.0)
MCH: 24.7 pg — AB (ref 26.0–34.0)
MCHC: 32.4 g/dL (ref 32.0–36.0)
MCV: 76 fL — AB (ref 81–101)
MONO#: 1.2 10*3/uL — ABNORMAL HIGH (ref 0.1–0.9)
MONO%: 10.2 % (ref 0.0–13.0)
NEUT#: 5.9 10*3/uL (ref 1.5–6.5)
NEUT%: 51.5 % (ref 39.6–80.0)
Platelets: 284 10*3/uL (ref 145–400)
RBC: 4.61 10*6/uL (ref 3.70–5.32)
RDW: 18.2 % — AB (ref 11.1–15.7)
WBC: 11.4 10*3/uL — ABNORMAL HIGH (ref 3.9–10.0)

## 2014-12-12 MED ORDER — PACLITAXEL PROTEIN-BOUND CHEMO INJECTION 100 MG
100.0000 mg/m2 | Freq: Once | INTRAVENOUS | Status: AC
  Administered 2014-12-12: 150 mg via INTRAVENOUS
  Filled 2014-12-12: qty 30

## 2014-12-12 MED ORDER — SODIUM CHLORIDE 0.9 % IV SOLN
Freq: Once | INTRAVENOUS | Status: AC
  Administered 2014-12-12: 11:00:00 via INTRAVENOUS

## 2014-12-12 MED ORDER — DEXAMETHASONE SODIUM PHOSPHATE 10 MG/ML IJ SOLN
INTRAMUSCULAR | Status: AC
  Filled 2014-12-12: qty 1

## 2014-12-12 MED ORDER — HEPARIN SOD (PORK) LOCK FLUSH 100 UNIT/ML IV SOLN
500.0000 [IU] | Freq: Once | INTRAVENOUS | Status: AC | PRN
  Administered 2014-12-12: 500 [IU]
  Filled 2014-12-12: qty 5

## 2014-12-12 MED ORDER — SODIUM CHLORIDE 0.9 % IJ SOLN
10.0000 mL | INTRAMUSCULAR | Status: DC | PRN
  Administered 2014-12-12: 10 mL
  Filled 2014-12-12: qty 10

## 2014-12-12 MED ORDER — SODIUM CHLORIDE 0.9 % IV SOLN
800.0000 mg/m2 | Freq: Once | INTRAVENOUS | Status: AC
  Administered 2014-12-12: 1292 mg via INTRAVENOUS
  Filled 2014-12-12: qty 33.98

## 2014-12-12 MED ORDER — ONDANSETRON 8 MG/50ML IVPB (CHCC)
8.0000 mg | Freq: Once | INTRAVENOUS | Status: AC
  Administered 2014-12-12: 8 mg via INTRAVENOUS

## 2014-12-12 MED ORDER — DEXAMETHASONE SODIUM PHOSPHATE 10 MG/ML IJ SOLN
10.0000 mg | Freq: Once | INTRAMUSCULAR | Status: AC
  Administered 2014-12-12: 10 mg via INTRAVENOUS

## 2014-12-12 NOTE — Patient Instructions (Signed)
Gemcitabine injection What is this medicine? GEMCITABINE (jem SIT a been) is a chemotherapy drug. This medicine is used to treat many types of cancer like breast cancer, lung cancer, pancreatic cancer, and ovarian cancer. This medicine may be used for other purposes; ask your health care provider or pharmacist if you have questions. COMMON BRAND NAME(S): Gemzar What should I tell my health care provider before I take this medicine? They need to know if you have any of these conditions: -blood disorders -infection -kidney disease -liver disease -recent or ongoing radiation therapy -an unusual or allergic reaction to gemcitabine, other chemotherapy, other medicines, foods, dyes, or preservatives -pregnant or trying to get pregnant -breast-feeding How should I use this medicine? This drug is given as an infusion into a vein. It is administered in a hospital or clinic by a specially trained health care professional. Talk to your pediatrician regarding the use of this medicine in children. Special care may be needed. Overdosage: If you think you have taken too much of this medicine contact a poison control center or emergency room at once. NOTE: This medicine is only for you. Do not share this medicine with others. What if I miss a dose? It is important not to miss your dose. Call your doctor or health care professional if you are unable to keep an appointment. What may interact with this medicine? -medicines to increase blood counts like filgrastim, pegfilgrastim, sargramostim -some other chemotherapy drugs like cisplatin -vaccines Talk to your doctor or health care professional before taking any of these medicines: -acetaminophen -aspirin -ibuprofen -ketoprofen -naproxen This list may not describe all possible interactions. Give your health care provider a list of all the medicines, herbs, non-prescription drugs, or dietary supplements you use. Also tell them if you smoke, drink alcohol,  or use illegal drugs. Some items may interact with your medicine. What should I watch for while using this medicine? Visit your doctor for checks on your progress. This drug may make you feel generally unwell. This is not uncommon, as chemotherapy can affect healthy cells as well as cancer cells. Report any side effects. Continue your course of treatment even though you feel ill unless your doctor tells you to stop. In some cases, you may be given additional medicines to help with side effects. Follow all directions for their use. Call your doctor or health care professional for advice if you get a fever, chills or sore throat, or other symptoms of a cold or flu. Do not treat yourself. This drug decreases your body's ability to fight infections. Try to avoid being around people who are sick. This medicine may increase your risk to bruise or bleed. Call your doctor or health care professional if you notice any unusual bleeding. Be careful brushing and flossing your teeth or using a toothpick because you may get an infection or bleed more easily. If you have any dental work done, tell your dentist you are receiving this medicine. Avoid taking products that contain aspirin, acetaminophen, ibuprofen, naproxen, or ketoprofen unless instructed by your doctor. These medicines may hide a fever. Women should inform their doctor if they wish to become pregnant or think they might be pregnant. There is a potential for serious side effects to an unborn child. Talk to your health care professional or pharmacist for more information. Do not breast-feed an infant while taking this medicine. What side effects may I notice from receiving this medicine? Side effects that you should report to your doctor or health care professional as   soon as possible: -allergic reactions like skin rash, itching or hives, swelling of the face, lips, or tongue -low blood counts - this medicine may decrease the number of white blood cells,  red blood cells and platelets. You may be at increased risk for infections and bleeding. -signs of infection - fever or chills, cough, sore throat, pain or difficulty passing urine -signs of decreased platelets or bleeding - bruising, pinpoint red spots on the skin, black, tarry stools, blood in the urine -signs of decreased red blood cells - unusually weak or tired, fainting spells, lightheadedness -breathing problems -chest pain -mouth sores -nausea and vomiting -pain, swelling, redness at site where injected -pain, tingling, numbness in the hands or feet -stomach pain -swelling of ankles, feet, hands -unusual bleeding Side effects that usually do not require medical attention (report to your doctor or health care professional if they continue or are bothersome): -constipation -diarrhea -hair loss -loss of appetite -stomach upset This list may not describe all possible side effects. Call your doctor for medical advice about side effects. You may report side effects to FDA at 1-800-FDA-1088. Where should I keep my medicine? This drug is given in a hospital or clinic and will not be stored at home. NOTE: This sheet is a summary. It may not cover all possible information. If you have questions about this medicine, talk to your doctor, pharmacist, or health care provider.  2015, [employer]. (2008-02-28 18:45:54)   Nanoparticle Albumin-Bound Paclitaxel injection What is this medicine? NANOPARTICLE ALBUMIN-BOUND PACLITAXEL (Na no PAHR ti kuhl al BYOO muhn-bound PAK li TAX el) is a chemotherapy drug. It targets fast dividing cells, like cancer cells, and causes these cells to die. This medicine is used to treat advanced breast cancer and advanced lung cancer. This medicine may be used for other purposes; ask your health care provider or pharmacist if you have questions. COMMON BRAND NAME(S): Abraxane What should I tell my health care provider before I take this medicine? They  need to know if you have any of these conditions: -kidney disease -liver disease -low blood counts, like low platelets, red blood cells, or white blood cells -recent or ongoing radiation therapy -an unusual or allergic reaction to paclitaxel, albumin, other chemotherapy, other medicines, foods, dyes, or preservatives -pregnant or trying to get pregnant -breast-feeding How should I use this medicine? This drug is given as an infusion into a vein. It is administered in a hospital or clinic by a specially trained health care professional. Talk to your pediatrician regarding the use of this medicine in children. Special care may be needed. Overdosage: If you think you have taken too much of this medicine contact a poison control center or emergency room at once. NOTE: This medicine is only for you. Do not share this medicine with others. What if I miss a dose? It is important not to miss your dose. Call your doctor or health care professional if you are unable to keep an appointment. What may interact with this medicine? -cyclosporine -diazepam -ketoconazole -medicines to increase blood counts like filgrastim, pegfilgrastim, sargramostim -other chemotherapy drugs like cisplatin, doxorubicin, epirubicin, etoposide, teniposide, vincristine -quinidine -testosterone -vaccines -verapamil Talk to your doctor or health care professional before taking any of these medicines: -acetaminophen -aspirin -ibuprofen -ketoprofen -naproxen This list may not describe all possible interactions. Give your health care provider a list of all the medicines, herbs, non-prescription drugs, or dietary supplements you use. Also tell them if you smoke, drink alcohol, or use illegal drugs. Some  items may interact with your medicine. What should I watch for while using this medicine? Your condition will be monitored carefully while you are receiving this medicine. You will need important blood work done while you are  taking this medicine. This drug may make you feel generally unwell. This is not uncommon, as chemotherapy can affect healthy cells as well as cancer cells. Report any side effects. Continue your course of treatment even though you feel ill unless your doctor tells you to stop. In some cases, you may be given additional medicines to help with side effects. Follow all directions for their use. Call your doctor or health care professional for advice if you get a fever, chills or sore throat, or other symptoms of a cold or flu. Do not treat yourself. This drug decreases your body's ability to fight infections. Try to avoid being around people who are sick. This medicine may increase your risk to bruise or bleed. Call your doctor or health care professional if you notice any unusual bleeding. Be careful brushing and flossing your teeth or using a toothpick because you may get an infection or bleed more easily. If you have any dental work done, tell your dentist you are receiving this medicine. Avoid taking products that contain aspirin, acetaminophen, ibuprofen, naproxen, or ketoprofen unless instructed by your doctor. These medicines may hide a fever. Do not become pregnant while taking this medicine. Women should inform their doctor if they wish to become pregnant or think they might be pregnant. There is a potential for serious side effects to an unborn child. Talk to your health care professional or pharmacist for more information. Do not breast-feed an infant while taking this medicine. Men are advised not to father a child while receiving this medicine. What side effects may I notice from receiving this medicine? Side effects that you should report to your doctor or health care professional as soon as possible: -allergic reactions like skin rash, itching or hives, swelling of the face, lips, or tongue -low blood counts - This drug may decrease the number of white blood cells, red blood cells and  platelets. You may be at increased risk for infections and bleeding. -signs of infection - fever or chills, cough, sore throat, pain or difficulty passing urine -signs of decreased platelets or bleeding - bruising, pinpoint red spots on the skin, black, tarry stools, nosebleeds -signs of decreased red blood cells - unusually weak or tired, fainting spells, lightheadedness -breathing problems -changes in vision -chest pain -high or low blood pressure -mouth sores Implanted Port Insertion, Care After Refer to this sheet in the next few weeks. These instructions provide you with information on caring for yourself after your procedure. Your health care provider may also give you more specific instructions. Your treatment has been planned according to current medical practices, but problems sometimes occur. Call your health care provider if you have any problems or questions after your procedure. WHAT TO EXPECT AFTER THE PROCEDURE After your procedure, it is typical to have the following:   Discomfort at the port insertion site. Ice packs to the area will help.  Bruising on the skin over the port. This will subside in 3-4 days. HOME CARE INSTRUCTIONS  After your port is placed, you will get a manufacturer's information card. The card has information about your port. Keep this card with you at all times.   Know what kind of port you have. There are many types of ports available.   Wear a medical  alert bracelet in case of an emergency. This can help alert health care workers that you have a port.   The port can stay in for as long as your health care provider believes it is necessary.   A home health care nurse may give medicines and take care of the port.   You or a family member can get special training and directions for giving medicine and taking care of the port at home.  SEEK MEDICAL CARE IF:   Your port does not flush or you are unable to get a blood return.   You have a  fever or chills. SEEK IMMEDIATE MEDICAL CARE IF:  You have new fluid or pus coming from your incision.   You notice a bad smell coming from your incision site.   You have swelling, pain, or more redness at the incision or port site.   You have chest pain or shortness of breath. Document Released: 08/09/2013 Document Revised: 10/24/2013 Document Reviewed: 08/09/2013 [facility] Patient Information 2015 [place], [place]. This information is not intended to replace advice given to you by your health care provider. Make sure you discuss any questions you have with your health care provider.

## 2014-12-25 ENCOUNTER — Other Ambulatory Visit: Payer: Self-pay | Admitting: *Deleted

## 2014-12-25 DIAGNOSIS — C259 Malignant neoplasm of pancreas, unspecified: Secondary | ICD-10-CM

## 2014-12-25 DIAGNOSIS — C7800 Secondary malignant neoplasm of unspecified lung: Principal | ICD-10-CM

## 2014-12-26 ENCOUNTER — Other Ambulatory Visit (HOSPITAL_BASED_OUTPATIENT_CLINIC_OR_DEPARTMENT_OTHER): Payer: Medicare Other | Admitting: Lab

## 2014-12-26 ENCOUNTER — Ambulatory Visit (HOSPITAL_BASED_OUTPATIENT_CLINIC_OR_DEPARTMENT_OTHER): Payer: Medicare Other | Admitting: Hematology & Oncology

## 2014-12-26 ENCOUNTER — Ambulatory Visit (HOSPITAL_BASED_OUTPATIENT_CLINIC_OR_DEPARTMENT_OTHER): Payer: Medicare Other

## 2014-12-26 ENCOUNTER — Encounter: Payer: Self-pay | Admitting: Hematology & Oncology

## 2014-12-26 VITALS — BP 157/80 | HR 101 | Temp 98.3°F | Resp 16 | Ht 60.0 in | Wt 136.0 lb

## 2014-12-26 DIAGNOSIS — C259 Malignant neoplasm of pancreas, unspecified: Secondary | ICD-10-CM

## 2014-12-26 DIAGNOSIS — C7800 Secondary malignant neoplasm of unspecified lung: Secondary | ICD-10-CM

## 2014-12-26 DIAGNOSIS — Z5111 Encounter for antineoplastic chemotherapy: Secondary | ICD-10-CM

## 2014-12-26 DIAGNOSIS — C250 Malignant neoplasm of head of pancreas: Secondary | ICD-10-CM

## 2014-12-26 DIAGNOSIS — K8689 Other specified diseases of pancreas: Secondary | ICD-10-CM

## 2014-12-26 LAB — CBC WITH DIFFERENTIAL (CANCER CENTER ONLY)
BASO#: 0 10*3/uL (ref 0.0–0.2)
BASO%: 0.2 % (ref 0.0–2.0)
EOS%: 2.5 % (ref 0.0–7.0)
Eosinophils Absolute: 0.3 10*3/uL (ref 0.0–0.5)
HCT: 38.5 % (ref 34.8–46.6)
HEMOGLOBIN: 12.5 g/dL (ref 11.6–15.9)
LYMPH#: 4.3 10*3/uL — ABNORMAL HIGH (ref 0.9–3.3)
LYMPH%: 35 % (ref 14.0–48.0)
MCH: 24.5 pg — AB (ref 26.0–34.0)
MCHC: 32.5 g/dL (ref 32.0–36.0)
MCV: 75 fL — ABNORMAL LOW (ref 81–101)
MONO#: 1.7 10*3/uL — ABNORMAL HIGH (ref 0.1–0.9)
MONO%: 14 % — AB (ref 0.0–13.0)
NEUT#: 6 10*3/uL (ref 1.5–6.5)
NEUT%: 48.3 % (ref 39.6–80.0)
Platelets: 513 10*3/uL — ABNORMAL HIGH (ref 145–400)
RBC: 5.11 10*6/uL (ref 3.70–5.32)
RDW: 19.1 % — ABNORMAL HIGH (ref 11.1–15.7)
WBC: 12.4 10*3/uL — ABNORMAL HIGH (ref 3.9–10.0)

## 2014-12-26 LAB — CMP (CANCER CENTER ONLY)
ALT: 32 U/L (ref 10–47)
AST: 32 U/L (ref 11–38)
Albumin: 3.7 g/dL (ref 3.3–5.5)
Alkaline Phosphatase: 113 U/L — ABNORMAL HIGH (ref 26–84)
BUN, Bld: 11 mg/dL (ref 7–22)
CO2: 22 mEq/L (ref 18–33)
Calcium: 9.6 mg/dL (ref 8.0–10.3)
Chloride: 103 mEq/L (ref 98–108)
Creat: 0.7 mg/dl (ref 0.6–1.2)
Glucose, Bld: 210 mg/dL — ABNORMAL HIGH (ref 73–118)
POTASSIUM: 3.6 meq/L (ref 3.3–4.7)
Sodium: 142 mEq/L (ref 128–145)
Total Bilirubin: 0.4 mg/dl (ref 0.20–1.60)
Total Protein: 8 g/dL (ref 6.4–8.1)

## 2014-12-26 LAB — TECHNOLOGIST REVIEW CHCC SATELLITE

## 2014-12-26 MED ORDER — SODIUM CHLORIDE 0.9 % IJ SOLN
10.0000 mL | INTRAMUSCULAR | Status: DC | PRN
  Administered 2014-12-26: 10 mL
  Filled 2014-12-26: qty 10

## 2014-12-26 MED ORDER — SODIUM CHLORIDE 0.9 % IV SOLN
Freq: Once | INTRAVENOUS | Status: AC
  Administered 2014-12-26: 11:00:00 via INTRAVENOUS

## 2014-12-26 MED ORDER — HEPARIN SOD (PORK) LOCK FLUSH 100 UNIT/ML IV SOLN
500.0000 [IU] | Freq: Once | INTRAVENOUS | Status: AC | PRN
  Administered 2014-12-26: 500 [IU]
  Filled 2014-12-26: qty 5

## 2014-12-26 MED ORDER — PACLITAXEL PROTEIN-BOUND CHEMO INJECTION 100 MG
100.0000 mg/m2 | Freq: Once | INTRAVENOUS | Status: AC
  Administered 2014-12-26: 150 mg via INTRAVENOUS
  Filled 2014-12-26: qty 30

## 2014-12-26 MED ORDER — DEXAMETHASONE SODIUM PHOSPHATE 10 MG/ML IJ SOLN
INTRAMUSCULAR | Status: AC
  Filled 2014-12-26: qty 1

## 2014-12-26 MED ORDER — GEMCITABINE HCL CHEMO INJECTION 1 GM/26.3ML
800.0000 mg/m2 | Freq: Once | INTRAVENOUS | Status: AC
  Administered 2014-12-26: 1292 mg via INTRAVENOUS
  Filled 2014-12-26: qty 26.3

## 2014-12-26 MED ORDER — ONDANSETRON 8 MG/50ML IVPB (CHCC)
8.0000 mg | Freq: Once | INTRAVENOUS | Status: AC
  Administered 2014-12-26: 8 mg via INTRAVENOUS

## 2014-12-26 MED ORDER — DEXAMETHASONE SODIUM PHOSPHATE 10 MG/ML IJ SOLN
10.0000 mg | Freq: Once | INTRAMUSCULAR | Status: AC
  Administered 2014-12-26: 10 mg via INTRAVENOUS

## 2014-12-26 NOTE — Progress Notes (Signed)
Hematology and Oncology Follow Up Visit  Jacqueline Hahn [id number] 1946-02-26 69 y.o. 12/26/2014   Principle Diagnosis:   Metastatic adenocarcinoma of the pancreas  Current Therapy:    Status post 5 cycles of gemcitabine/Abraxane  Zometa 4 mg IV every month       Interim History:  Ms.  Jacqueline Hahn is back for follow-up. She really looks good. She really done well with chemotherapy. Her last set scans did show pretty much stable disease.  Her last CA 19-9 when down to 484. Hopeful he, this is a good sign.  She's had no abdominal pain. She's had no nausea or vomiting. She thinks that maybe the Creon that we give her might have caused an episode of vomiting. She no longer is taking this.  She's had no bleeding. There's been no diarrhea. She's had no cough. She's had no shortness of breath.  She is having no issues with pain.  Overall, her performance status is ECOG 1   Medications:  Current outpatient prescriptions:  .  amLODipine (NORVASC) 10 MG tablet, Take 1 tablet (10 mg total) by mouth daily., Disp: 30 tablet, Rfl: 3 .  Calcium Carbonate-Vitamin D (CALCIUM + D PO), Take 1 tablet by mouth daily., Disp: , Rfl:  .  Flaxseed, Linseed, (FLAX PO), Take 1 tablet by mouth daily., Disp: , Rfl:  .  glipiZIDE-metformin (METAGLIP) 5-500 MG per tablet, 1 tablet 2 (two) times daily before a meal. , Disp: , Rfl: 0 .  lansoprazole (PREVACID) 15 MG capsule, Take 15 mg by mouth daily as needed (for heartburn). , Disp: , Rfl:  .  lidocaine-prilocaine (EMLA) cream, Apply 1 application topically as needed. Apply quarter sized amount to portacath site at least one hour prior to chemo treatment, Disp: 30 g, Rfl: 0 .  lipase/protease/amylase (CREON) 12000 UNITS CPEP capsule, Take 12,000 Units by mouth daily as needed (for upset stomach). , Disp: , Rfl:  .  megestrol (MEGACE) 400 MG/10ML suspension, Take 800 mg by mouth daily., Disp: , Rfl:  .  Multiple Vitamin (MULTIVITAMIN WITH MINERALS) TABS tablet, Take 1  tablet by mouth daily., Disp: , Rfl:  .  pyridOXINE (VITAMIN B-6) 100 MG tablet, Take 100 mg by mouth daily., Disp: , Rfl:  .  vitamin A 8000 UNIT capsule, Take 8,000 Units by mouth daily., Disp: , Rfl:  .  vitamin E 400 UNIT capsule, Take 400 Units by mouth daily., Disp: , Rfl:  .  diphenoxylate-atropine (LOMOTIL) 2.5-0.025 MG per tablet, Take 1 tablet by mouth every 4 (four) hours as needed for diarrhea or loose stools (max 6 pills per day). (Patient not taking: Reported on 11/28/2014), Disp: 30 tablet, Rfl: 0  Allergies: No Known Allergies  Past Medical History, Surgical history, Social history, and Family History were reviewed and updated.  Review of Systems: As above  Physical Exam:  height is 5' (1.524 m) and weight is 136 lb (61.689 kg). Her oral temperature is 98.3 F (36.8 C). Her blood pressure is 157/80 and her pulse is 101. Her respiration is 16.   Well-developed and well-nourished African-American female in no obvious distress. Her head and neck exam shows no ocular or oral lesions. There are no palpable cervical or supraclavicular lymph nodes. Lungs are clear. Cardiac exam regular rate and rhythm with a normal S1 and S2. She has no murmurs, rubs or bruits. Abdomen is soft. She good bowel sounds. There is no fluid wave. There is no palpable liver or spleen tip. Back exam shows no tenderness  over the spine, ribs or hips. Extremities shows no clubbing, cyanosis or edema. She has good range of motion of her joints. She has good strength in her extremities. Skin exam shows no rashes, ecchymoses or petechia. Neurological exam is nonfocal.  Lab Results  Component Value Date   WBC 12.4* 12/26/2014   HGB 12.5 12/26/2014   HCT 38.5 12/26/2014   MCV 75* 12/26/2014   PLT 513* 12/26/2014     Chemistry      Component Value Date/Time   NA 142 12/26/2014 0910   NA 136* 09/01/2014 1148   K 3.6 12/26/2014 0910   K 3.5* 09/01/2014 1148   CL 103 12/26/2014 0910   CL 102 09/01/2014 1148    CO2 22 12/26/2014 0910   CO2 19 09/01/2014 1148   BUN 11 12/26/2014 0910   BUN 11 09/01/2014 1148   CREATININE 0.7 12/26/2014 0910   CREATININE 0.64 09/01/2014 1148      Component Value Date/Time   CALCIUM 9.6 12/26/2014 0910   CALCIUM 9.3 09/01/2014 1148   ALKPHOS 113* 12/26/2014 0910   ALKPHOS 121* 09/01/2014 1148   AST 32 12/26/2014 0910   AST 48* 09/01/2014 1148   ALT 32 12/26/2014 0910   ALT 85* 09/01/2014 1148   BILITOT 0.40 12/26/2014 0910   BILITOT 0.6 09/01/2014 1148         Impression and Plan: Ms. Jacqueline Hahn is 69 year old African-American female with metastatic pancreatic cancer. She is on Gemzar/Abraxane. She is responding. Her quality of life is actually quite good.  I'm glad that her CT scan showed that she has stable disease. To me, this is definitely a "victory".  Her quality of life is doing pretty good. She is quite happy with how well she seems to be doing. She's gained some weight.  We will proceed with her fourth cycle of treatment. I think this be her fifth cycle of treatment.  We do give her Zometa.  We'll plan to get her back in one month for a visit.  I spent about 25-30 minutes with her today.  [name], MD 2/24/201610:23 AM

## 2014-12-26 NOTE — Patient Instructions (Signed)
Gemcitabine injection What is this medicine? GEMCITABINE (jem SIT a been) is a chemotherapy drug. This medicine is used to treat many types of cancer like breast cancer, lung cancer, pancreatic cancer, and ovarian cancer. This medicine may be used for other purposes; ask your health care provider or pharmacist if you have questions. COMMON BRAND NAME(S): Gemzar What should I tell my health care provider before I take this medicine? They need to know if you have any of these conditions: -blood disorders -infection -kidney disease -liver disease -recent or ongoing radiation therapy -an unusual or allergic reaction to gemcitabine, other chemotherapy, other medicines, foods, dyes, or preservatives -pregnant or trying to get pregnant -breast-feeding How should I use this medicine? This drug is given as an infusion into a vein. It is administered in a hospital or clinic by a specially trained health care professional. Talk to your pediatrician regarding the use of this medicine in children. Special care may be needed. Overdosage: If you think you have taken too much of this medicine contact a poison control center or emergency room at once. NOTE: This medicine is only for you. Do not share this medicine with others. What if I miss a dose? It is important not to miss your dose. Call your doctor or health care professional if you are unable to keep an appointment. What may interact with this medicine? -medicines to increase blood counts like filgrastim, pegfilgrastim, sargramostim -some other chemotherapy drugs like cisplatin -vaccines Talk to your doctor or health care professional before taking any of these medicines: -acetaminophen -aspirin -ibuprofen -ketoprofen -naproxen This list may not describe all possible interactions. Give your health care provider a list of all the medicines, herbs, non-prescription drugs, or dietary supplements you use. Also tell them if you smoke, drink alcohol,  or use illegal drugs. Some items may interact with your medicine. What should I watch for while using this medicine? Visit your doctor for checks on your progress. This drug may make you feel generally unwell. This is not uncommon, as chemotherapy can affect healthy cells as well as cancer cells. Report any side effects. Continue your course of treatment even though you feel ill unless your doctor tells you to stop. In some cases, you may be given additional medicines to help with side effects. Follow all directions for their use. Call your doctor or health care professional for advice if you get a fever, chills or sore throat, or other symptoms of a cold or flu. Do not treat yourself. This drug decreases your body's ability to fight infections. Try to avoid being around people who are sick. This medicine may increase your risk to bruise or bleed. Call your doctor or health care professional if you notice any unusual bleeding. Be careful brushing and flossing your teeth or using a toothpick because you may get an infection or bleed more easily. If you have any dental work done, tell your dentist you are receiving this medicine. Avoid taking products that contain aspirin, acetaminophen, ibuprofen, naproxen, or ketoprofen unless instructed by your doctor. These medicines may hide a fever. Women should inform their doctor if they wish to become pregnant or think they might be pregnant. There is a potential for serious side effects to an unborn child. Talk to your health care professional or pharmacist for more information. Do not breast-feed an infant while taking this medicine. What side effects may I notice from receiving this medicine? Side effects that you should report to your doctor or health care professional as   soon as possible: -allergic reactions like skin rash, itching or hives, swelling of the face, lips, or tongue -low blood counts - this medicine may decrease the number of white blood cells,  red blood cells and platelets. You may be at increased risk for infections and bleeding. -signs of infection - fever or chills, cough, sore throat, pain or difficulty passing urine -signs of decreased platelets or bleeding - bruising, pinpoint red spots on the skin, black, tarry stools, blood in the urine -signs of decreased red blood cells - unusually weak or tired, fainting spells, lightheadedness -breathing problems -chest pain -mouth sores -nausea and vomiting -pain, swelling, redness at site where injected -pain, tingling, numbness in the hands or feet -stomach pain -swelling of ankles, feet, hands -unusual bleeding Side effects that usually do not require medical attention (report to your doctor or health care professional if they continue or are bothersome): -constipation -diarrhea -hair loss -loss of appetite -stomach upset This list may not describe all possible side effects. Call your doctor for medical advice about side effects. You may report side effects to FDA at 1-800-FDA-1088. Where should I keep my medicine? This drug is given in a hospital or clinic and will not be stored at home. NOTE: This sheet is a summary. It may not cover all possible information. If you have questions about this medicine, talk to your doctor, pharmacist, or health care provider.  2015, [employer]. (2008-02-28 18:45:54)   Nanoparticle Albumin-Bound Paclitaxel injection What is this medicine? NANOPARTICLE ALBUMIN-BOUND PACLITAXEL (Na no PAHR ti kuhl al BYOO muhn-bound PAK li TAX el) is a chemotherapy drug. It targets fast dividing cells, like cancer cells, and causes these cells to die. This medicine is used to treat advanced breast cancer and advanced lung cancer. This medicine may be used for other purposes; ask your health care provider or pharmacist if you have questions. COMMON BRAND NAME(S): Abraxane What should I tell my health care provider before I take this medicine? They  need to know if you have any of these conditions: -kidney disease -liver disease -low blood counts, like low platelets, red blood cells, or white blood cells -recent or ongoing radiation therapy -an unusual or allergic reaction to paclitaxel, albumin, other chemotherapy, other medicines, foods, dyes, or preservatives -pregnant or trying to get pregnant -breast-feeding How should I use this medicine? This drug is given as an infusion into a vein. It is administered in a hospital or clinic by a specially trained health care professional. Talk to your pediatrician regarding the use of this medicine in children. Special care may be needed. Overdosage: If you think you have taken too much of this medicine contact a poison control center or emergency room at once. NOTE: This medicine is only for you. Do not share this medicine with others. What if I miss a dose? It is important not to miss your dose. Call your doctor or health care professional if you are unable to keep an appointment. What may interact with this medicine? -cyclosporine -diazepam -ketoconazole -medicines to increase blood counts like filgrastim, pegfilgrastim, sargramostim -other chemotherapy drugs like cisplatin, doxorubicin, epirubicin, etoposide, teniposide, vincristine -quinidine -testosterone -vaccines -verapamil Talk to your doctor or health care professional before taking any of these medicines: -acetaminophen -aspirin -ibuprofen -ketoprofen -naproxen This list may not describe all possible interactions. Give your health care provider a list of all the medicines, herbs, non-prescription drugs, or dietary supplements you use. Also tell them if you smoke, drink alcohol, or use illegal drugs. Some  items may interact with your medicine. What should I watch for while using this medicine? Your condition will be monitored carefully while you are receiving this medicine. You will need important blood work done while you are  taking this medicine. This drug may make you feel generally unwell. This is not uncommon, as chemotherapy can affect healthy cells as well as cancer cells. Report any side effects. Continue your course of treatment even though you feel ill unless your doctor tells you to stop. In some cases, you may be given additional medicines to help with side effects. Follow all directions for their use. Call your doctor or health care professional for advice if you get a fever, chills or sore throat, or other symptoms of a cold or flu. Do not treat yourself. This drug decreases your body's ability to fight infections. Try to avoid being around people who are sick. This medicine may increase your risk to bruise or bleed. Call your doctor or health care professional if you notice any unusual bleeding. Be careful brushing and flossing your teeth or using a toothpick because you may get an infection or bleed more easily. If you have any dental work done, tell your dentist you are receiving this medicine. Avoid taking products that contain aspirin, acetaminophen, ibuprofen, naproxen, or ketoprofen unless instructed by your doctor. These medicines may hide a fever. Do not become pregnant while taking this medicine. Women should inform their doctor if they wish to become pregnant or think they might be pregnant. There is a potential for serious side effects to an unborn child. Talk to your health care professional or pharmacist for more information. Do not breast-feed an infant while taking this medicine. Men are advised not to father a child while receiving this medicine. What side effects may I notice from receiving this medicine? Side effects that you should report to your doctor or health care professional as soon as possible: -allergic reactions like skin rash, itching or hives, swelling of the face, lips, or tongue -low blood counts - This drug may decrease the number of white blood cells, red blood cells and  platelets. You may be at increased risk for infections and bleeding. -signs of infection - fever or chills, cough, sore throat, pain or difficulty passing urine -signs of decreased platelets or bleeding - bruising, pinpoint red spots on the skin, black, tarry stools, nosebleeds -signs of decreased red blood cells - unusually weak or tired, fainting spells, lightheadedness -breathing problems -changes in vision -chest pain -high or low blood pressure -mouth sores Implanted Port Insertion, Care After Refer to this sheet in the next few weeks. These instructions provide you with information on caring for yourself after your procedure. Your health care provider may also give you more specific instructions. Your treatment has been planned according to current medical practices, but problems sometimes occur. Call your health care provider if you have any problems or questions after your procedure. WHAT TO EXPECT AFTER THE PROCEDURE After your procedure, it is typical to have the following:   Discomfort at the port insertion site. Ice packs to the area will help.  Bruising on the skin over the port. This will subside in 3-4 days. HOME CARE INSTRUCTIONS  After your port is placed, you will get a manufacturer's information card. The card has information about your port. Keep this card with you at all times.   Know what kind of port you have. There are many types of ports available.   Wear a medical  alert bracelet in case of an emergency. This can help alert health care workers that you have a port.   The port can stay in for as long as your health care provider believes it is necessary.   A home health care nurse may give medicines and take care of the port.   You or a family member can get special training and directions for giving medicine and taking care of the port at home.  SEEK MEDICAL CARE IF:   Your port does not flush or you are unable to get a blood return.   You have a  fever or chills. SEEK IMMEDIATE MEDICAL CARE IF:  You have new fluid or pus coming from your incision.   You notice a bad smell coming from your incision site.   You have swelling, pain, or more redness at the incision or port site.   You have chest pain or shortness of breath. Document Released: 08/09/2013 Document Revised: 10/24/2013 Document Reviewed: 08/09/2013 [facility] Patient Information 2015 [place], [place]. This information is not intended to replace advice given to you by your health care provider. Make sure you discuss any questions you have with your health care provider.

## 2015-01-24 ENCOUNTER — Other Ambulatory Visit: Payer: Self-pay | Admitting: Family

## 2015-01-25 ENCOUNTER — Encounter: Payer: Self-pay | Admitting: Family

## 2015-01-25 ENCOUNTER — Ambulatory Visit (HOSPITAL_BASED_OUTPATIENT_CLINIC_OR_DEPARTMENT_OTHER): Payer: Medicare Other

## 2015-01-25 ENCOUNTER — Ambulatory Visit (HOSPITAL_BASED_OUTPATIENT_CLINIC_OR_DEPARTMENT_OTHER): Payer: Medicare Other | Admitting: Family

## 2015-01-25 ENCOUNTER — Other Ambulatory Visit (HOSPITAL_BASED_OUTPATIENT_CLINIC_OR_DEPARTMENT_OTHER): Payer: Medicare Other

## 2015-01-25 DIAGNOSIS — C7800 Secondary malignant neoplasm of unspecified lung: Secondary | ICD-10-CM

## 2015-01-25 DIAGNOSIS — K8689 Other specified diseases of pancreas: Secondary | ICD-10-CM

## 2015-01-25 DIAGNOSIS — C259 Malignant neoplasm of pancreas, unspecified: Secondary | ICD-10-CM | POA: Diagnosis not present

## 2015-01-25 DIAGNOSIS — C250 Malignant neoplasm of head of pancreas: Secondary | ICD-10-CM | POA: Diagnosis not present

## 2015-01-25 DIAGNOSIS — Z5111 Encounter for antineoplastic chemotherapy: Secondary | ICD-10-CM | POA: Diagnosis not present

## 2015-01-25 DIAGNOSIS — Z452 Encounter for adjustment and management of vascular access device: Secondary | ICD-10-CM | POA: Diagnosis not present

## 2015-01-25 LAB — CMP (CANCER CENTER ONLY)
ALBUMIN: 3.5 g/dL (ref 3.3–5.5)
ALK PHOS: 141 U/L — AB (ref 26–84)
ALT(SGPT): 76 U/L — ABNORMAL HIGH (ref 10–47)
AST: 91 U/L — ABNORMAL HIGH (ref 11–38)
BILIRUBIN TOTAL: 0.7 mg/dL (ref 0.20–1.60)
BUN: 12 mg/dL (ref 7–22)
CALCIUM: 9.7 mg/dL (ref 8.0–10.3)
CO2: 24 meq/L (ref 18–33)
Chloride: 105 mEq/L (ref 98–108)
Creat: 0.8 mg/dl (ref 0.6–1.2)
GLUCOSE: 254 mg/dL — AB (ref 73–118)
POTASSIUM: 3.9 meq/L (ref 3.3–4.7)
SODIUM: 138 meq/L (ref 128–145)
Total Protein: 8.3 g/dL — ABNORMAL HIGH (ref 6.4–8.1)

## 2015-01-25 LAB — CBC WITH DIFFERENTIAL (CANCER CENTER ONLY)
BASO#: 0.1 10*3/uL (ref 0.0–0.2)
BASO%: 0.6 % (ref 0.0–2.0)
EOS%: 1.9 % (ref 0.0–7.0)
Eosinophils Absolute: 0.2 10*3/uL (ref 0.0–0.5)
HCT: 39.5 % (ref 34.8–46.6)
HEMOGLOBIN: 13 g/dL (ref 11.6–15.9)
LYMPH#: 3.3 10*3/uL (ref 0.9–3.3)
LYMPH%: 35.4 % (ref 14.0–48.0)
MCH: 24 pg — ABNORMAL LOW (ref 26.0–34.0)
MCHC: 32.9 g/dL (ref 32.0–36.0)
MCV: 73 fL — ABNORMAL LOW (ref 81–101)
MONO#: 1.3 10*3/uL — AB (ref 0.1–0.9)
MONO%: 13.6 % — ABNORMAL HIGH (ref 0.0–13.0)
NEUT#: 4.6 10*3/uL (ref 1.5–6.5)
NEUT%: 48.5 % (ref 39.6–80.0)
Platelets: 309 10*3/uL (ref 145–400)
RBC: 5.42 10*6/uL — ABNORMAL HIGH (ref 3.70–5.32)
RDW: 17.5 % — AB (ref 11.1–15.7)
WBC: 9.4 10*3/uL (ref 3.9–10.0)

## 2015-01-25 LAB — CANCER ANTIGEN 19-9: CA 19-9: 2512.4 U/mL — ABNORMAL HIGH (ref <35.0)

## 2015-01-25 MED ORDER — SODIUM CHLORIDE 0.9 % IV SOLN
800.0000 mg/m2 | Freq: Once | INTRAVENOUS | Status: AC
  Administered 2015-01-25: 1292 mg via INTRAVENOUS
  Filled 2015-01-25: qty 33.98

## 2015-01-25 MED ORDER — ALTEPLASE 2 MG IJ SOLR
2.0000 mg | Freq: Once | INTRAMUSCULAR | Status: AC | PRN
  Administered 2015-01-25: 2 mg
  Filled 2015-01-25: qty 2

## 2015-01-25 MED ORDER — DEXAMETHASONE SODIUM PHOSPHATE 100 MG/10ML IJ SOLN
Freq: Once | INTRAMUSCULAR | Status: AC
  Administered 2015-01-25: 10:00:00 via INTRAVENOUS
  Filled 2015-01-25: qty 4

## 2015-01-25 MED ORDER — SODIUM CHLORIDE 0.9 % IJ SOLN
10.0000 mL | INTRAMUSCULAR | Status: DC | PRN
  Administered 2015-01-25: 10 mL
  Filled 2015-01-25: qty 10

## 2015-01-25 MED ORDER — ALTEPLASE 2 MG IJ SOLR
INTRAMUSCULAR | Status: AC
  Filled 2015-01-25: qty 2

## 2015-01-25 MED ORDER — HEPARIN SOD (PORK) LOCK FLUSH 100 UNIT/ML IV SOLN
250.0000 [IU] | Freq: Once | INTRAVENOUS | Status: DC | PRN
  Filled 2015-01-25: qty 5

## 2015-01-25 MED ORDER — SODIUM CHLORIDE 0.9 % IJ SOLN
3.0000 mL | INTRAMUSCULAR | Status: DC | PRN
  Filled 2015-01-25: qty 10

## 2015-01-25 MED ORDER — HEPARIN SOD (PORK) LOCK FLUSH 100 UNIT/ML IV SOLN
500.0000 [IU] | Freq: Once | INTRAVENOUS | Status: AC | PRN
  Administered 2015-01-25: 500 [IU]
  Filled 2015-01-25: qty 5

## 2015-01-25 MED ORDER — PACLITAXEL PROTEIN-BOUND CHEMO INJECTION 100 MG
100.0000 mg/m2 | Freq: Once | INTRAVENOUS | Status: AC
  Administered 2015-01-25: 150 mg via INTRAVENOUS
  Filled 2015-01-25: qty 30

## 2015-01-25 MED ORDER — SODIUM CHLORIDE 0.9 % IV SOLN
Freq: Once | INTRAVENOUS | Status: AC
  Administered 2015-01-25: 10:00:00 via INTRAVENOUS

## 2015-01-25 NOTE — Progress Notes (Signed)
Hematology and Oncology Follow Up Visit  Heleena Miceli [id number] August 28, 1946 69 y.o. 01/25/2015   Principle Diagnosis:  Metastatic adenocarcinoma of the pancreas  Current Therapy:   Status post 6 cycles of gemcitabine/Abraxane Zometa 4 mg IV every month    Interim History: Ms. Baumbach is here today for Cycle 7 of treatment. She is doing well. Her scans in January showed stable disease.  She stopped taking the Creon because she said it made her nauseated. She would also like to stop her aspirin because of her sensitive stomach.  She denies fever, chills, n/v, cough, rash, headache, dizziness, SOB, chest pain, palpitations, abdominal pain, diarrhea, blood in urine or stool. She has had no issues with infections. No episodes of bleeding.  She is still eating pears and apples to help with her constipation.  She has had no swelling, tenderness, numbness or tingling in her extremities. No new aches or pains. Her appetite is good and she is staying hydrated. Her weight is stable. She is walking in the evening now for exercise.   Medications:    Medication List       This list is accurate as of: 01/25/15  8:37 AM.  Always use your most recent med list.               amLODipine 10 MG tablet  Commonly known as:  NORVASC  Take 1 tablet (10 mg total) by mouth daily.     CALCIUM + D PO  Take 1 tablet by mouth daily.     diphenoxylate-atropine 2.5-0.025 MG per tablet  Commonly known as:  LOMOTIL  Take 1 tablet by mouth every 4 (four) hours as needed for diarrhea or loose stools (max 6 pills per day).     FLAX PO  Take 1 tablet by mouth daily.     glipiZIDE-metformin 5-500 MG per tablet  Commonly known as:  METAGLIP  1 tablet 2 (two) times daily before a meal.     lansoprazole 15 MG capsule  Commonly known as:  PREVACID  Take 15 mg by mouth daily as needed (for heartburn).     lidocaine-prilocaine cream  Commonly known as:  EMLA  Apply 1 application topically as needed. Apply  quarter sized amount to portacath site at least one hour prior to chemo treatment     lipase/protease/amylase 12000 UNITS Cpep capsule  Commonly known as:  CREON  Take 12,000 Units by mouth daily as needed (for upset stomach).     megestrol 400 MG/10ML suspension  Commonly known as:  MEGACE  Take 800 mg by mouth daily.     multivitamin with minerals Tabs tablet  Take 1 tablet by mouth daily.     pyridOXINE 100 MG tablet  Commonly known as:  VITAMIN B-6  Take 100 mg by mouth daily.     vitamin A 8000 UNIT capsule  Take 8,000 Units by mouth daily.     vitamin E 400 UNIT capsule  Take 400 Units by mouth daily.        Allergies: No Known Allergies  Past Medical History, Surgical history, Social history, and Family History were reviewed and updated.  Review of Systems: All other 10 point review of systems is negative.   Physical Exam:  vitals were not taken for this visit.  Wt Readings from Last 3 Encounters:  12/26/14 136 lb (61.689 kg)  11/28/14 137 lb (62.143 kg)  11/14/14 134 lb (60.782 kg)    Ocular: Sclerae unicteric, pupils equal, round and reactive  to light Ear-nose-throat: Oropharynx clear, dentition fair Lymphatic: No cervical or supraclavicular adenopathy Lungs no rales or rhonchi, good excursion bilaterally Heart regular rate and rhythm, no murmur appreciated Abd soft, nontender, positive bowel sounds MSK no focal spinal tenderness, no joint edema Neuro: non-focal, well-oriented, appropriate affect Breasts: Deferred  Lab Results  Component Value Date   WBC 9.4 01/25/2015   HGB 13.0 01/25/2015   HCT 39.5 01/25/2015   MCV 73* 01/25/2015   PLT 309 01/25/2015   Lab Results  Component Value Date   FERRITIN 205 06/25/2014   IRON 44 06/25/2014   TIBC 301 06/25/2014   UIBC 257 06/25/2014   IRONPCTSAT 15* 06/25/2014   Lab Results  Component Value Date   RBC 5.42* 01/25/2015   No results found for: KPAFRELGTCHN, LAMBDASER, KAPLAMBRATIO No results  found for: IGGSERUM, IGA, IGMSERUM No results found for: Kathrynn Ducking, MSPIKE, SPEI   Chemistry      Component Value Date/Time   NA 142 12/26/2014 0910   NA 136* 09/01/2014 1148   K 3.6 12/26/2014 0910   K 3.5* 09/01/2014 1148   CL 103 12/26/2014 0910   CL 102 09/01/2014 1148   CO2 22 12/26/2014 0910   CO2 19 09/01/2014 1148   BUN 11 12/26/2014 0910   BUN 11 09/01/2014 1148   CREATININE 0.7 12/26/2014 0910   CREATININE 0.64 09/01/2014 1148      Component Value Date/Time   CALCIUM 9.6 12/26/2014 0910   CALCIUM 9.3 09/01/2014 1148   ALKPHOS 113* 12/26/2014 0910   ALKPHOS 121* 09/01/2014 1148   AST 32 12/26/2014 0910   AST 48* 09/01/2014 1148   ALT 32 12/26/2014 0910   ALT 85* 09/01/2014 1148   BILITOT 0.40 12/26/2014 0910   BILITOT 0.6 09/01/2014 1148     Impression and Plan: Ms. Hagerty is 69 year old female. She is metastatic adenocarcinoma of pancreas. In February, her CA 19-9 level was 484.8. She is doing well and is asymptomatic at this time.  She will proceed with Cycle 7 of gemcitabine/abraxane today.  She will continue to get Zometa monthly. We will repeat her CT scans the first week of May.  Her blood sugar is up at 254 and her LFTs are also elevated. She is trying to monitor this. We will see what the rest of her labs show.  We will give her a new treatment and appointment schedule. She knows to call here with any questions or concerns. We can certainly see her sooner if need be.   Eliezer Bottom, NP 3/25/20168:37 AM

## 2015-01-25 NOTE — Patient Instructions (Signed)
Nanoparticle Albumin-Bound Paclitaxel injection What is this medicine? NANOPARTICLE ALBUMIN-BOUND PACLITAXEL (Na no PAHR ti kuhl al BYOO muhn-bound PAK li TAX el) is a chemotherapy drug. It targets fast dividing cells, like cancer cells, and causes these cells to die. This medicine is used to treat advanced breast cancer and advanced lung cancer. This medicine may be used for other purposes; ask your health care provider or pharmacist if you have questions. COMMON BRAND NAME(S): Abraxane What should I tell my health care provider before I take this medicine? They need to know if you have any of these conditions: -kidney disease -liver disease -low blood counts, like low platelets, red blood cells, or white blood cells -recent or ongoing radiation therapy -an unusual or allergic reaction to paclitaxel, albumin, other chemotherapy, other medicines, foods, dyes, or preservatives -pregnant or trying to get pregnant -breast-feeding How should I use this medicine? This drug is given as an infusion into a vein. It is administered in a hospital or clinic by a specially trained health care professional. Talk to your pediatrician regarding the use of this medicine in children. Special care may be needed. Overdosage: If you think you have taken too much of this medicine contact a poison control center or emergency room at once. NOTE: This medicine is only for you. Do not share this medicine with others. What if I miss a dose? It is important not to miss your dose. Call your doctor or health care professional if you are unable to keep an appointment. What may interact with this medicine? -cyclosporine -diazepam -ketoconazole -medicines to increase blood counts like filgrastim, pegfilgrastim, sargramostim -other chemotherapy drugs like cisplatin, doxorubicin, epirubicin, etoposide, teniposide, vincristine -quinidine -testosterone -vaccines -verapamil Talk to your doctor or health care professional  before taking any of these medicines: -acetaminophen -aspirin -ibuprofen -ketoprofen -naproxen This list may not describe all possible interactions. Give your health care provider a list of all the medicines, herbs, non-prescription drugs, or dietary supplements you use. Also tell them if you smoke, drink alcohol, or use illegal drugs. Some items may interact with your medicine. What should I watch for while using this medicine? Your condition will be monitored carefully while you are receiving this medicine. You will need important blood work done while you are taking this medicine. This drug may make you feel generally unwell. This is not uncommon, as chemotherapy can affect healthy cells as well as cancer cells. Report any side effects. Continue your course of treatment even though you feel ill unless your doctor tells you to stop. In some cases, you may be given additional medicines to help with side effects. Follow all directions for their use. Call your doctor or health care professional for advice if you get a fever, chills or sore throat, or other symptoms of a cold or flu. Do not treat yourself. This drug decreases your body's ability to fight infections. Try to avoid being around people who are sick. This medicine may increase your risk to bruise or bleed. Call your doctor or health care professional if you notice any unusual bleeding. Be careful brushing and flossing your teeth or using a toothpick because you may get an infection or bleed more easily. If you have any dental work done, tell your dentist you are receiving this medicine. Avoid taking products that contain aspirin, acetaminophen, ibuprofen, naproxen, or ketoprofen unless instructed by your doctor. These medicines may hide a fever. Do not become pregnant while taking this medicine. Women should inform their doctor if they wish  to become pregnant or think they might be pregnant. There is a potential for serious side effects to  an unborn child. Talk to your health care professional or pharmacist for more information. Do not breast-feed an infant while taking this medicine. Men are advised not to father a child while receiving this medicine. What side effects may I notice from receiving this medicine? Side effects that you should report to your doctor or health care professional as soon as possible: -allergic reactions like skin rash, itching or hives, swelling of the face, lips, or tongue -low blood counts - This drug may decrease the number of white blood cells, red blood cells and platelets. You may be at increased risk for infections and bleeding. -signs of infection - fever or chills, cough, sore throat, pain or difficulty passing urine -signs of decreased platelets or bleeding - bruising, pinpoint red spots on the skin, black, tarry stools, nosebleeds -signs of decreased red blood cells - unusually weak or tired, fainting spells, lightheadedness -breathing problems -changes in vision -chest pain -high or low blood pressure -mouth sores -nausea and vomiting -pain, swelling, redness or irritation at the injection site -pain, tingling, numbness in the hands or feet -slow or irregular heartbeat -swelling of the ankle, feet, hands Side effects that usually do not require medical attention (report to your doctor or health care professional if they continue or are bothersome): -aches, pains -changes in the color of fingernails -diarrhea -hair loss -loss of appetite This list may not describe all possible side effects. Call your doctor for medical advice about side effects. You may report side effects to FDA at 1-800-FDA-1088. Where should I keep my medicine? This drug is given in a hospital or clinic and will not be stored at home. NOTE: This sheet is a summary. It may not cover all possible information. If you have questions about this medicine, talk to your doctor, pharmacist, or health care provider.  2015,  Elsevier/Gold Standard. (2012-12-12 16:48:50) Gemcitabine injection What is this medicine? GEMCITABINE (jem SIT a been) is a chemotherapy drug. This medicine is used to treat many types of cancer like breast cancer, lung cancer, pancreatic cancer, and ovarian cancer. This medicine may be used for other purposes; ask your health care provider or pharmacist if you have questions. COMMON BRAND NAME(S): Gemzar What should I tell my health care provider before I take this medicine? They need to know if you have any of these conditions: -blood disorders -infection -kidney disease -liver disease -recent or ongoing radiation therapy -an unusual or allergic reaction to gemcitabine, other chemotherapy, other medicines, foods, dyes, or preservatives -pregnant or trying to get pregnant -breast-feeding How should I use this medicine? This drug is given as an infusion into a vein. It is administered in a hospital or clinic by a specially trained health care professional. Talk to your pediatrician regarding the use of this medicine in children. Special care may be needed. Overdosage: If you think you have taken too much of this medicine contact a poison control center or emergency room at once. NOTE: This medicine is only for you. Do not share this medicine with others. What if I miss a dose? It is important not to miss your dose. Call your doctor or health care professional if you are unable to keep an appointment. What may interact with this medicine? -medicines to increase blood counts like filgrastim, pegfilgrastim, sargramostim -some other chemotherapy drugs like cisplatin -vaccines Talk to your doctor or health care professional before taking any  of these medicines: -acetaminophen -aspirin -ibuprofen -ketoprofen -naproxen This list may not describe all possible interactions. Give your health care provider a list of all the medicines, herbs, non-prescription drugs, or dietary supplements you  use. Also tell them if you smoke, drink alcohol, or use illegal drugs. Some items may interact with your medicine. What should I watch for while using this medicine? Visit your doctor for checks on your progress. This drug may make you feel generally unwell. This is not uncommon, as chemotherapy can affect healthy cells as well as cancer cells. Report any side effects. Continue your course of treatment even though you feel ill unless your doctor tells you to stop. In some cases, you may be given additional medicines to help with side effects. Follow all directions for their use. Call your doctor or health care professional for advice if you get a fever, chills or sore throat, or other symptoms of a cold or flu. Do not treat yourself. This drug decreases your body's ability to fight infections. Try to avoid being around people who are sick. This medicine may increase your risk to bruise or bleed. Call your doctor or health care professional if you notice any unusual bleeding. Be careful brushing and flossing your teeth or using a toothpick because you may get an infection or bleed more easily. If you have any dental work done, tell your dentist you are receiving this medicine. Avoid taking products that contain aspirin, acetaminophen, ibuprofen, naproxen, or ketoprofen unless instructed by your doctor. These medicines may hide a fever. Women should inform their doctor if they wish to become pregnant or think they might be pregnant. There is a potential for serious side effects to an unborn child. Talk to your health care professional or pharmacist for more information. Do not breast-feed an infant while taking this medicine. What side effects may I notice from receiving this medicine? Side effects that you should report to your doctor or health care professional as soon as possible: -allergic reactions like skin rash, itching or hives, swelling of the face, lips, or tongue -low blood counts - this  medicine may decrease the number of white blood cells, red blood cells and platelets. You may be at increased risk for infections and bleeding. -signs of infection - fever or chills, cough, sore throat, pain or difficulty passing urine -signs of decreased platelets or bleeding - bruising, pinpoint red spots on the skin, black, tarry stools, blood in the urine -signs of decreased red blood cells - unusually weak or tired, fainting spells, lightheadedness -breathing problems -chest pain -mouth sores -nausea and vomiting -pain, swelling, redness at site where injected -pain, tingling, numbness in the hands or feet -stomach pain -swelling of ankles, feet, hands -unusual bleeding Side effects that usually do not require medical attention (report to your doctor or health care professional if they continue or are bothersome): -constipation -diarrhea -hair loss -loss of appetite -stomach upset This list may not describe all possible side effects. Call your doctor for medical advice about side effects. You may report side effects to FDA at 1-800-FDA-1088. Where should I keep my medicine? This drug is given in a hospital or clinic and will not be stored at home. NOTE: This sheet is a summary. It may not cover all possible information. If you have questions about this medicine, talk to your doctor, pharmacist, or health care provider.  2015, Elsevier/Gold Standard. (2008-02-28 18:45:54)

## 2015-02-01 ENCOUNTER — Ambulatory Visit (HOSPITAL_BASED_OUTPATIENT_CLINIC_OR_DEPARTMENT_OTHER): Payer: Medicare Other

## 2015-02-01 ENCOUNTER — Other Ambulatory Visit (HOSPITAL_BASED_OUTPATIENT_CLINIC_OR_DEPARTMENT_OTHER): Payer: Medicare Other

## 2015-02-01 DIAGNOSIS — Z5111 Encounter for antineoplastic chemotherapy: Secondary | ICD-10-CM | POA: Diagnosis not present

## 2015-02-01 DIAGNOSIS — C7800 Secondary malignant neoplasm of unspecified lung: Secondary | ICD-10-CM

## 2015-02-01 DIAGNOSIS — C250 Malignant neoplasm of head of pancreas: Secondary | ICD-10-CM

## 2015-02-01 DIAGNOSIS — C259 Malignant neoplasm of pancreas, unspecified: Secondary | ICD-10-CM

## 2015-02-01 DIAGNOSIS — K8689 Other specified diseases of pancreas: Secondary | ICD-10-CM

## 2015-02-01 LAB — CMP (CANCER CENTER ONLY)
ALBUMIN: 3.6 g/dL (ref 3.3–5.5)
ALK PHOS: 110 U/L — AB (ref 26–84)
ALT(SGPT): 104 U/L — ABNORMAL HIGH (ref 10–47)
AST: 76 U/L — ABNORMAL HIGH (ref 11–38)
BUN: 13 mg/dL (ref 7–22)
CO2: 22 mEq/L (ref 18–33)
Calcium: 9.8 mg/dL (ref 8.0–10.3)
Chloride: 105 mEq/L (ref 98–108)
Creat: 0.5 mg/dl — ABNORMAL LOW (ref 0.6–1.2)
Glucose, Bld: 207 mg/dL — ABNORMAL HIGH (ref 73–118)
POTASSIUM: 3.5 meq/L (ref 3.3–4.7)
Sodium: 141 mEq/L (ref 128–145)
Total Bilirubin: 0.5 mg/dl (ref 0.20–1.60)
Total Protein: 8.2 g/dL — ABNORMAL HIGH (ref 6.4–8.1)

## 2015-02-01 LAB — CBC WITH DIFFERENTIAL (CANCER CENTER ONLY)
BASO#: 0 10*3/uL (ref 0.0–0.2)
BASO%: 0.2 % (ref 0.0–2.0)
EOS%: 1.3 % (ref 0.0–7.0)
Eosinophils Absolute: 0.1 10*3/uL (ref 0.0–0.5)
HEMATOCRIT: 39.9 % (ref 34.8–46.6)
HEMOGLOBIN: 13.2 g/dL (ref 11.6–15.9)
LYMPH#: 5 10*3/uL — ABNORMAL HIGH (ref 0.9–3.3)
LYMPH%: 51.9 % — AB (ref 14.0–48.0)
MCH: 24.1 pg — ABNORMAL LOW (ref 26.0–34.0)
MCHC: 33.1 g/dL (ref 32.0–36.0)
MCV: 73 fL — AB (ref 81–101)
MONO#: 0.8 10*3/uL (ref 0.1–0.9)
MONO%: 8.2 % (ref 0.0–13.0)
NEUT#: 3.7 10*3/uL (ref 1.5–6.5)
NEUT%: 38.4 % — ABNORMAL LOW (ref 39.6–80.0)
Platelets: 271 10*3/uL (ref 145–400)
RBC: 5.47 10*6/uL — ABNORMAL HIGH (ref 3.70–5.32)
RDW: 18 % — AB (ref 11.1–15.7)
WBC: 9.6 10*3/uL (ref 3.9–10.0)

## 2015-02-01 MED ORDER — SODIUM CHLORIDE 0.9 % IV SOLN
1200.0000 mg | Freq: Once | INTRAVENOUS | Status: AC
  Administered 2015-02-01: 1216.73 mg via INTRAVENOUS
  Filled 2015-02-01: qty 32

## 2015-02-01 MED ORDER — PACLITAXEL PROTEIN-BOUND CHEMO INJECTION 100 MG
100.0000 mg/m2 | Freq: Once | INTRAVENOUS | Status: AC
  Administered 2015-02-01: 150 mg via INTRAVENOUS
  Filled 2015-02-01: qty 30

## 2015-02-01 MED ORDER — SODIUM CHLORIDE 0.9 % IJ SOLN
10.0000 mL | INTRAMUSCULAR | Status: DC | PRN
  Administered 2015-02-01: 10 mL
  Filled 2015-02-01: qty 10

## 2015-02-01 MED ORDER — DEXAMETHASONE SODIUM PHOSPHATE 100 MG/10ML IJ SOLN
Freq: Once | INTRAMUSCULAR | Status: AC
  Administered 2015-02-01: 13:00:00 via INTRAVENOUS
  Filled 2015-02-01: qty 4

## 2015-02-01 MED ORDER — SODIUM CHLORIDE 0.9 % IV SOLN
Freq: Once | INTRAVENOUS | Status: AC
  Administered 2015-02-01: 12:00:00 via INTRAVENOUS

## 2015-02-01 MED ORDER — HEPARIN SOD (PORK) LOCK FLUSH 100 UNIT/ML IV SOLN
500.0000 [IU] | Freq: Once | INTRAVENOUS | Status: AC | PRN
  Administered 2015-02-01: 500 [IU]
  Filled 2015-02-01: qty 5

## 2015-02-01 NOTE — Patient Instructions (Signed)
[facility] Discharge Instructions for Patients Receiving Chemotherapy  Today you received the following chemotherapy agents Gemzar and Abraxane.  To help prevent nausea and vomiting after your treatment, we encourage you to take your nausea medication.   If you develop nausea and vomiting that is not controlled by your nausea medication, call the clinic.   BELOW ARE SYMPTOMS THAT SHOULD BE REPORTED IMMEDIATELY:  *FEVER GREATER THAN 100.5 F  *CHILLS WITH OR WITHOUT FEVER  NAUSEA AND VOMITING THAT IS NOT CONTROLLED WITH YOUR NAUSEA MEDICATION  *UNUSUAL SHORTNESS OF BREATH  *UNUSUAL BRUISING OR BLEEDING  TENDERNESS IN MOUTH AND THROAT WITH OR WITHOUT PRESENCE OF ULCERS  *URINARY PROBLEMS  *BOWEL PROBLEMS  UNUSUAL RASH Items with * indicate a potential emergency and should be followed up as soon as possible.  Feel free to call the clinic you have any questions or concerns. The clinic phone number is [phone].  Please show the CHEMO ALERT CARD at check-in to the Emergency Department and triage nurse.   

## 2015-02-02 LAB — CANCER ANTIGEN 19-9: CA 19-9: 3494.1 U/mL — ABNORMAL HIGH (ref <35.0)

## 2015-02-04 ENCOUNTER — Other Ambulatory Visit: Payer: Self-pay | Admitting: Hematology & Oncology

## 2015-02-07 ENCOUNTER — Other Ambulatory Visit: Payer: Self-pay | Admitting: *Deleted

## 2015-02-07 DIAGNOSIS — C259 Malignant neoplasm of pancreas, unspecified: Secondary | ICD-10-CM

## 2015-02-07 DIAGNOSIS — C7800 Secondary malignant neoplasm of unspecified lung: Principal | ICD-10-CM

## 2015-02-08 ENCOUNTER — Telehealth: Payer: Self-pay | Admitting: Hematology & Oncology

## 2015-02-08 ENCOUNTER — Other Ambulatory Visit: Payer: Medicare Other

## 2015-02-08 ENCOUNTER — Ambulatory Visit: Payer: Medicare Other

## 2015-02-08 NOTE — Telephone Encounter (Signed)
Received call from pt she has just been told her sister has terminal cancer and will probably not survive the week. She is flying out today to go be with her. She said she would call on the way back to reschedule tx. I left RN message.

## 2015-02-22 ENCOUNTER — Other Ambulatory Visit (HOSPITAL_BASED_OUTPATIENT_CLINIC_OR_DEPARTMENT_OTHER): Payer: Medicare Other

## 2015-02-22 ENCOUNTER — Ambulatory Visit (HOSPITAL_BASED_OUTPATIENT_CLINIC_OR_DEPARTMENT_OTHER): Payer: Medicare Other | Admitting: Hematology & Oncology

## 2015-02-22 ENCOUNTER — Ambulatory Visit (HOSPITAL_BASED_OUTPATIENT_CLINIC_OR_DEPARTMENT_OTHER): Payer: Medicare Other

## 2015-02-22 ENCOUNTER — Encounter: Payer: Self-pay | Admitting: Hematology & Oncology

## 2015-02-22 VITALS — BP 164/84 | HR 105 | Temp 98.1°F | Resp 14 | Ht 60.0 in | Wt 136.0 lb

## 2015-02-22 DIAGNOSIS — C259 Malignant neoplasm of pancreas, unspecified: Secondary | ICD-10-CM | POA: Diagnosis not present

## 2015-02-22 DIAGNOSIS — C7800 Secondary malignant neoplasm of unspecified lung: Secondary | ICD-10-CM | POA: Diagnosis not present

## 2015-02-22 DIAGNOSIS — K8689 Other specified diseases of pancreas: Secondary | ICD-10-CM

## 2015-02-22 DIAGNOSIS — Z452 Encounter for adjustment and management of vascular access device: Secondary | ICD-10-CM | POA: Diagnosis not present

## 2015-02-22 DIAGNOSIS — Z5111 Encounter for antineoplastic chemotherapy: Secondary | ICD-10-CM

## 2015-02-22 DIAGNOSIS — C250 Malignant neoplasm of head of pancreas: Secondary | ICD-10-CM | POA: Diagnosis not present

## 2015-02-22 LAB — CBC WITH DIFFERENTIAL (CANCER CENTER ONLY)
BASO#: 0.1 10*3/uL (ref 0.0–0.2)
BASO%: 0.5 % (ref 0.0–2.0)
EOS ABS: 0.2 10*3/uL (ref 0.0–0.5)
EOS%: 1.5 % (ref 0.0–7.0)
HCT: 36.5 % (ref 34.8–46.6)
HEMOGLOBIN: 11.8 g/dL (ref 11.6–15.9)
LYMPH#: 4.1 10*3/uL — AB (ref 0.9–3.3)
LYMPH%: 37.1 % (ref 14.0–48.0)
MCH: 23.9 pg — ABNORMAL LOW (ref 26.0–34.0)
MCHC: 32.3 g/dL (ref 32.0–36.0)
MCV: 74 fL — AB (ref 81–101)
MONO#: 1.6 10*3/uL — AB (ref 0.1–0.9)
MONO%: 14.1 % — ABNORMAL HIGH (ref 0.0–13.0)
NEUT#: 5.2 10*3/uL (ref 1.5–6.5)
NEUT%: 46.8 % (ref 39.6–80.0)
Platelets: 688 10*3/uL — ABNORMAL HIGH (ref 145–400)
RBC: 4.94 10*6/uL (ref 3.70–5.32)
RDW: 17.5 % — ABNORMAL HIGH (ref 11.1–15.7)
WBC: 11 10*3/uL — ABNORMAL HIGH (ref 3.9–10.0)

## 2015-02-22 LAB — CMP (CANCER CENTER ONLY)
ALBUMIN: 3.4 g/dL (ref 3.3–5.5)
ALT: 67 U/L — AB (ref 10–47)
AST: 44 U/L — AB (ref 11–38)
Alkaline Phosphatase: 141 U/L — ABNORMAL HIGH (ref 26–84)
BUN, Bld: 12 mg/dL (ref 7–22)
CHLORIDE: 105 meq/L (ref 98–108)
CO2: 21 mEq/L (ref 18–33)
CREATININE: 0.9 mg/dL (ref 0.6–1.2)
Calcium: 9.5 mg/dL (ref 8.0–10.3)
Glucose, Bld: 328 mg/dL — ABNORMAL HIGH (ref 73–118)
POTASSIUM: 3.6 meq/L (ref 3.3–4.7)
Sodium: 140 mEq/L (ref 128–145)
Total Bilirubin: 0.6 mg/dl (ref 0.20–1.60)
Total Protein: 7.7 g/dL (ref 6.4–8.1)

## 2015-02-22 MED ORDER — SODIUM CHLORIDE 0.9 % IV SOLN
800.0000 mg/m2 | Freq: Once | INTRAVENOUS | Status: AC
  Administered 2015-02-22: 1292 mg via INTRAVENOUS
  Filled 2015-02-22: qty 33.98

## 2015-02-22 MED ORDER — ALTEPLASE 2 MG IJ SOLR
2.0000 mg | Freq: Once | INTRAMUSCULAR | Status: AC | PRN
  Administered 2015-02-22: 2 mg
  Filled 2015-02-22: qty 2

## 2015-02-22 MED ORDER — STERILE WATER FOR INJECTION IJ SOLN
INTRAMUSCULAR | Status: AC
  Filled 2015-02-22: qty 10

## 2015-02-22 MED ORDER — SODIUM CHLORIDE 0.9 % IV SOLN
Freq: Once | INTRAVENOUS | Status: AC
  Administered 2015-02-22: 09:00:00 via INTRAVENOUS

## 2015-02-22 MED ORDER — INSULIN REGULAR HUMAN 100 UNIT/ML IJ SOLN: 15.0000 [IU] | Freq: Once | INTRAMUSCULAR | Status: DC

## 2015-02-22 MED ORDER — ALTEPLASE 2 MG IJ SOLR
INTRAMUSCULAR | Status: AC
  Filled 2015-02-22: qty 2

## 2015-02-22 MED ORDER — SODIUM CHLORIDE 0.9 % IV SOLN
Freq: Once | INTRAVENOUS | Status: AC
  Administered 2015-02-22: 10:00:00 via INTRAVENOUS
  Filled 2015-02-22: qty 4

## 2015-02-22 MED ORDER — VITAMIN B-6 250 MG PO TABS: 250.0000 mg | ORAL_TABLET | Freq: Every day | ORAL | Status: DC

## 2015-02-22 MED ORDER — SODIUM CHLORIDE 0.9 % IJ SOLN
10.0000 mL | INTRAMUSCULAR | Status: DC | PRN
  Administered 2015-02-22: 10 mL
  Filled 2015-02-22: qty 10

## 2015-02-22 MED ORDER — PACLITAXEL PROTEIN-BOUND CHEMO INJECTION 100 MG
100.0000 mg/m2 | Freq: Once | INTRAVENOUS | Status: AC
  Administered 2015-02-22: 150 mg via INTRAVENOUS
  Filled 2015-02-22: qty 30

## 2015-02-22 MED ORDER — GLIPIZIDE-METFORMIN HCL 5-500 MG PO TABS: 1.0000 | ORAL_TABLET | Freq: Two times a day (BID) | ORAL | Status: DC

## 2015-02-22 MED ORDER — HEPARIN SOD (PORK) LOCK FLUSH 100 UNIT/ML IV SOLN
500.0000 [IU] | Freq: Once | INTRAVENOUS | Status: AC | PRN
  Administered 2015-02-22: 500 [IU]
  Filled 2015-02-22: qty 5

## 2015-02-22 NOTE — Progress Notes (Signed)
Hematology and Oncology Follow Up Visit  Jacqueline Hahn [id number] 07/23/1946 69 y.o. 02/22/2015   Principle Diagnosis:  Metastatic adenocarcinoma of the pancreas  Current Therapy:   Status post 6 cycles of gemcitabine/Abraxane Zometa 4 mg IV every month    Interim History: Jacqueline Hahn is back for follow-up. She missed a couple weeks of treatment because of a sisters death in [place]. Her sister apparently had cancer.  Jacqueline Hahn feels okay. She has some neuropathy in her feet. I'll put her on some vitaminB6. She does have some issues with blood sugars. She is on metformin.  Pain has not been a problem. She is walking 3 times a week.  Her last tumor marker in early May was up to 3500. This certainly is troublesome.  We will get her scans set up for early May. This will give [place] an idea as to whether or not she is responding.  She actually looks pretty good.  Her performance status is ECOG 1.  Medications:    Medication List       This list is accurate as of: 02/22/15  8:39 AM.  Always use your most recent med list.               amLODipine 10 MG tablet  Commonly known as:  NORVASC  Take 1 tablet (10 mg total) by mouth daily.     CALCIUM + D PO  Take 1 tablet by mouth daily.     FLAX PO  Take 1 tablet by mouth daily.     glipiZIDE-metformin 5-500 MG per tablet  Commonly known as:  METAGLIP  1 tablet 2 (two) times daily before a meal.     lansoprazole 15 MG capsule  Commonly known as:  PREVACID  Take 15 mg by mouth daily as needed (for heartburn).     lidocaine-prilocaine cream  Commonly known as:  EMLA  Apply 1 application topically as needed. Apply quarter sized amount to portacath site at least one hour prior to chemo treatment     lipase/protease/amylase 12000 UNITS Cpep capsule  Commonly known as:  CREON  Take 12,000 Units by mouth daily as needed (for upset stomach).     megestrol 400 MG/10ML suspension  Commonly known as:  MEGACE  Take 800 mg by mouth  daily.     multivitamin with minerals Tabs tablet  Take 1 tablet by mouth daily.     pyridOXINE 100 MG tablet  Commonly known as:  VITAMIN B-6  Take 100 mg by mouth daily.     vitamin A 8000 UNIT capsule  Take 8,000 Units by mouth daily.     vitamin E 400 UNIT capsule  Take 400 Units by mouth daily.        Allergies: No Known Allergies  Past Medical History, Surgical history, Social history, and Family History were reviewed and updated.  Review of Systems: All other 10 point review of systems is negative.   Physical Exam:  height is 5' (1.524 m) and weight is 136 lb (61.689 kg). Her oral temperature is 98.1 F (36.7 C). Her blood pressure is 164/84 and her pulse is 105. Her respiration is 14.   Wt Readings from Last 3 Encounters:  02/22/15 136 lb (61.689 kg)  01/25/15 136 lb (61.689 kg)  12/26/14 136 lb (61.689 kg)    Ocular: Sclerae unicteric, pupils equal, round and reactive to light Ear-nose-throat: Oropharynx clear, dentition fair Lymphatic: No cervical or supraclavicular adenopathy Lungs no rales or rhonchi, good  excursion bilaterally Heart regular rate and rhythm, no murmur appreciated Abd soft, nontender, positive bowel sounds MSK no focal spinal tenderness, no joint edema Neuro: non-focal, well-oriented, appropriate affect Breasts: Deferred  Lab Results  Component Value Date   WBC 11.0* 02/22/2015   HGB 11.8 02/22/2015   HCT 36.5 02/22/2015   MCV 74* 02/22/2015   PLT 688* 02/22/2015   Lab Results  Component Value Date   FERRITIN 205 06/25/2014   IRON 44 06/25/2014   TIBC 301 06/25/2014   UIBC 257 06/25/2014   IRONPCTSAT 15* 06/25/2014   Lab Results  Component Value Date   RBC 4.94 02/22/2015   No results found for: KPAFRELGTCHN, LAMBDASER, KAPLAMBRATIO No results found for: IGGSERUM, IGA, IGMSERUM No results found for: Odetta Pink, SPEI   Chemistry      Component Value Date/Time   NA  140 02/22/2015 0800   NA 136* 09/01/2014 1148   K 3.6 02/22/2015 0800   K 3.5* 09/01/2014 1148   CL 105 02/22/2015 0800   CL 102 09/01/2014 1148   CO2 21 02/22/2015 0800   CO2 19 09/01/2014 1148   BUN 12 02/22/2015 0800   BUN 11 09/01/2014 1148   CREATININE 0.9 02/22/2015 0800   CREATININE 0.64 09/01/2014 1148      Component Value Date/Time   CALCIUM 9.5 02/22/2015 0800   CALCIUM 9.3 09/01/2014 1148   ALKPHOS 141* 02/22/2015 0800   ALKPHOS 121* 09/01/2014 1148   AST 44* 02/22/2015 0800   AST 48* 09/01/2014 1148   ALT 67* 02/22/2015 0800   ALT 85* 09/01/2014 1148   BILITOT 0.60 02/22/2015 0800   BILITOT 0.6 09/01/2014 1148     Impression and Plan: Jacqueline Hahn is 69 year old female. She is metastatic adenocarcinoma of pancreas.   I did read a recent clinical article about adding cisplatin to Gemzar/Abraxane. I we can see what her CT scan looks like. This may give [place] an idea as to whether or not we can add this.  Again she looks quite good. I know that the unabated tumor marker is quite troublesome.  We will plan to see her back when she is due for her next cycle of treatment and by then, she had had her CT scan. erns. We can certainly see her sooner if need be.   [name], MD 4/22/20168:39 AM

## 2015-02-22 NOTE — Progress Notes (Signed)
Dr. Arcola Jansky notified of patient requesting not to take Insulin. No further orders.

## 2015-02-22 NOTE — Patient Instructions (Signed)
[facility] Discharge Instructions for Patients Receiving Chemotherapy  Today you received the following chemotherapy agents Gemzar and Abraxane.  To help prevent nausea and vomiting after your treatment, we encourage you to take your nausea medication.   If you develop nausea and vomiting that is not controlled by your nausea medication, call the clinic.   BELOW ARE SYMPTOMS THAT SHOULD BE REPORTED IMMEDIATELY:  *FEVER GREATER THAN 100.5 F  *CHILLS WITH OR WITHOUT FEVER  NAUSEA AND VOMITING THAT IS NOT CONTROLLED WITH YOUR NAUSEA MEDICATION  *UNUSUAL SHORTNESS OF BREATH  *UNUSUAL BRUISING OR BLEEDING  TENDERNESS IN MOUTH AND THROAT WITH OR WITHOUT PRESENCE OF ULCERS  *URINARY PROBLEMS  *BOWEL PROBLEMS  UNUSUAL RASH Items with * indicate a potential emergency and should be followed up as soon as possible.  Feel free to call the clinic you have any questions or concerns. The clinic phone number is [phone].  Please show the [place] at check-in to the Emergency Department and triage nurse.

## 2015-03-01 ENCOUNTER — Ambulatory Visit (HOSPITAL_BASED_OUTPATIENT_CLINIC_OR_DEPARTMENT_OTHER): Payer: Medicare Other

## 2015-03-01 ENCOUNTER — Other Ambulatory Visit (HOSPITAL_BASED_OUTPATIENT_CLINIC_OR_DEPARTMENT_OTHER): Payer: Medicare Other

## 2015-03-01 DIAGNOSIS — C259 Malignant neoplasm of pancreas, unspecified: Secondary | ICD-10-CM | POA: Diagnosis not present

## 2015-03-01 DIAGNOSIS — C7800 Secondary malignant neoplasm of unspecified lung: Secondary | ICD-10-CM

## 2015-03-01 DIAGNOSIS — Z5111 Encounter for antineoplastic chemotherapy: Secondary | ICD-10-CM | POA: Diagnosis not present

## 2015-03-01 DIAGNOSIS — K8689 Other specified diseases of pancreas: Secondary | ICD-10-CM

## 2015-03-01 LAB — CMP (CANCER CENTER ONLY)
ALK PHOS: 113 U/L — AB (ref 26–84)
ALT: 52 U/L — AB (ref 10–47)
AST: 37 U/L (ref 11–38)
Albumin: 4 g/dL (ref 3.3–5.5)
BUN, Bld: 15 mg/dL (ref 7–22)
CO2: 22 mEq/L (ref 18–33)
Calcium: 9.9 mg/dL (ref 8.0–10.3)
Chloride: 107 mEq/L (ref 98–108)
Creat: 0.7 mg/dl (ref 0.6–1.2)
GLUCOSE: 125 mg/dL — AB (ref 73–118)
POTASSIUM: 3.6 meq/L (ref 3.3–4.7)
Sodium: 139 mEq/L (ref 128–145)
Total Bilirubin: 0.5 mg/dl (ref 0.20–1.60)
Total Protein: 8.4 g/dL — ABNORMAL HIGH (ref 6.4–8.1)

## 2015-03-01 LAB — CBC WITH DIFFERENTIAL (CANCER CENTER ONLY)
BASO#: 0 10*3/uL (ref 0.0–0.2)
BASO%: 0.2 % (ref 0.0–2.0)
EOS%: 1.2 % (ref 0.0–7.0)
Eosinophils Absolute: 0.2 10*3/uL (ref 0.0–0.5)
HCT: 37.4 % (ref 34.8–46.6)
HGB: 12.3 g/dL (ref 11.6–15.9)
LYMPH#: 4.5 10*3/uL — ABNORMAL HIGH (ref 0.9–3.3)
LYMPH%: 37 % (ref 14.0–48.0)
MCH: 24.2 pg — ABNORMAL LOW (ref 26.0–34.0)
MCHC: 32.9 g/dL (ref 32.0–36.0)
MCV: 74 fL — AB (ref 81–101)
MONO#: 1.3 10*3/uL — ABNORMAL HIGH (ref 0.1–0.9)
MONO%: 10.4 % (ref 0.0–13.0)
NEUT#: 6.2 10*3/uL (ref 1.5–6.5)
NEUT%: 51.2 % (ref 39.6–80.0)
Platelets: ADEQUATE 10*3/uL (ref 145–400)
RBC: 5.08 10*6/uL (ref 3.70–5.32)
RDW: 17.7 % — ABNORMAL HIGH (ref 11.1–15.7)
WBC: 12.1 10*3/uL — ABNORMAL HIGH (ref 3.9–10.0)

## 2015-03-01 MED ORDER — SODIUM CHLORIDE 0.9 % IV SOLN
Freq: Once | INTRAVENOUS | Status: AC
  Administered 2015-03-01: 11:00:00 via INTRAVENOUS
  Filled 2015-03-01: qty 4

## 2015-03-01 MED ORDER — SODIUM CHLORIDE 0.9 % IV SOLN
800.0000 mg/m2 | Freq: Once | INTRAVENOUS | Status: AC
  Administered 2015-03-01: 1292 mg via INTRAVENOUS
  Filled 2015-03-01: qty 33.98

## 2015-03-01 MED ORDER — PACLITAXEL PROTEIN-BOUND CHEMO INJECTION 100 MG
100.0000 mg/m2 | Freq: Once | INTRAVENOUS | Status: AC
  Administered 2015-03-01: 150 mg via INTRAVENOUS
  Filled 2015-03-01: qty 30

## 2015-03-01 MED ORDER — SODIUM CHLORIDE 0.9 % IV SOLN
Freq: Once | INTRAVENOUS | Status: AC
  Administered 2015-03-01: 11:00:00 via INTRAVENOUS

## 2015-03-01 MED ORDER — SODIUM CHLORIDE 0.9 % IJ SOLN
10.0000 mL | INTRAMUSCULAR | Status: DC | PRN
  Administered 2015-03-01: 10 mL
  Filled 2015-03-01: qty 10

## 2015-03-01 MED ORDER — HEPARIN SOD (PORK) LOCK FLUSH 100 UNIT/ML IV SOLN
500.0000 [IU] | Freq: Once | INTRAVENOUS | Status: AC | PRN
  Administered 2015-03-01: 500 [IU]
  Filled 2015-03-01: qty 5

## 2015-03-01 NOTE — Patient Instructions (Addendum)
Gemcitabine injection What is this medicine? GEMCITABINE (jem SIT a been) is a chemotherapy drug. This medicine is used to treat many types of cancer like breast cancer, lung cancer, pancreatic cancer, and ovarian cancer. This medicine may be used for other purposes; ask your health care provider or pharmacist if you have questions. COMMON BRAND NAME(S): Gemzar What should I tell my health care provider before I take this medicine? They need to know if you have any of these conditions: -blood disorders -infection -kidney disease -liver disease -recent or ongoing radiation therapy -an unusual or allergic reaction to gemcitabine, other chemotherapy, other medicines, foods, dyes, or preservatives -pregnant or trying to get pregnant -breast-feeding How should I use this medicine? This drug is given as an infusion into a vein. It is administered in a hospital or clinic by a specially trained health care professional. Talk to your pediatrician regarding the use of this medicine in children. Special care may be needed. Overdosage: If you think you have taken too much of this medicine contact a poison control center or emergency room at once. NOTE: This medicine is only for you. Do not share this medicine with others. What if I miss a dose? It is important not to miss your dose. Call your doctor or health care professional if you are unable to keep an appointment. What may interact with this medicine? -medicines to increase blood counts like filgrastim, pegfilgrastim, sargramostim -some other chemotherapy drugs like cisplatin -vaccines Talk to your doctor or health care professional before taking any of these medicines: -acetaminophen -aspirin -ibuprofen -ketoprofen -naproxen This list may not describe all possible interactions. Give your health care provider a list of all the medicines, herbs, non-prescription drugs, or dietary supplements you use. Also tell them if you smoke, drink alcohol,  or use illegal drugs. Some items may interact with your medicine. What should I watch for while using this medicine? Visit your doctor for checks on your progress. This drug may make you feel generally unwell. This is not uncommon, as chemotherapy can affect healthy cells as well as cancer cells. Report any side effects. Continue your course of treatment even though you feel ill unless your doctor tells you to stop. In some cases, you may be given additional medicines to help with side effects. Follow all directions for their use. Call your doctor or health care professional for advice if you get a fever, chills or sore throat, or other symptoms of a cold or flu. Do not treat yourself. This drug decreases your body's ability to fight infections. Try to avoid being around people who are sick. This medicine may increase your risk to bruise or bleed. Call your doctor or health care professional if you notice any unusual bleeding. Be careful brushing and flossing your teeth or using a toothpick because you may get an infection or bleed more easily. If you have any dental work done, tell your dentist you are receiving this medicine. Avoid taking products that contain aspirin, acetaminophen, ibuprofen, naproxen, or ketoprofen unless instructed by your doctor. These medicines may hide a fever. Women should inform their doctor if they wish to become pregnant or think they might be pregnant. There is a potential for serious side effects to an unborn child. Talk to your health care professional or pharmacist for more information. Do not breast-feed an infant while taking this medicine. What side effects may I notice from receiving this medicine? Side effects that you should report to your doctor or health care professional as   soon as possible: -allergic reactions like skin rash, itching or hives, swelling of the face, lips, or tongue -low blood counts - this medicine may decrease the number of white blood cells,  red blood cells and platelets. You may be at increased risk for infections and bleeding. -signs of infection - fever or chills, cough, sore throat, pain or difficulty passing urine -signs of decreased platelets or bleeding - bruising, pinpoint red spots on the skin, black, tarry stools, blood in the urine -signs of decreased red blood cells - unusually weak or tired, fainting spells, lightheadedness -breathing problems -chest pain -mouth sores -nausea and vomiting -pain, swelling, redness at site where injected -pain, tingling, numbness in the hands or feet -stomach pain -swelling of ankles, feet, hands -unusual bleeding Side effects that usually do not require medical attention (report to your doctor or health care professional if they continue or are bothersome): -constipation -diarrhea -hair loss -loss of appetite -stomach upset This list may not describe all possible side effects. Call your doctor for medical advice about side effects. You may report side effects to FDA at 1-800-FDA-1088. Where should I keep my medicine? This drug is given in a hospital or clinic and will not be stored at home. NOTE: This sheet is a summary. It may not cover all possible information. If you have questions about this medicine, talk to your doctor, pharmacist, or health care provider.  2015, Elsevier/Gold Standard. (2008-02-28 18:45:54) Nanoparticle Albumin-Bound Paclitaxel injection What is this medicine? NANOPARTICLE ALBUMIN-BOUND PACLITAXEL (Na no PAHR ti kuhl al BYOO muhn-bound PAK li TAX el) is a chemotherapy drug. It targets fast dividing cells, like cancer cells, and causes these cells to die. This medicine is used to treat advanced breast cancer and advanced lung cancer. This medicine may be used for other purposes; ask your health care provider or pharmacist if you have questions. COMMON BRAND NAME(S): Abraxane What should I tell my health care provider before I take this medicine? They need  to know if you have any of these conditions: -kidney disease -liver disease -low blood counts, like low platelets, red blood cells, or white blood cells -recent or ongoing radiation therapy -an unusual or allergic reaction to paclitaxel, albumin, other chemotherapy, other medicines, foods, dyes, or preservatives -pregnant or trying to get pregnant -breast-feeding How should I use this medicine? This drug is given as an infusion into a vein. It is administered in a hospital or clinic by a specially trained health care professional. Talk to your pediatrician regarding the use of this medicine in children. Special care may be needed. Overdosage: If you think you have taken too much of this medicine contact a poison control center or emergency room at once. NOTE: This medicine is only for you. Do not share this medicine with others. What if I miss a dose? It is important not to miss your dose. Call your doctor or health care professional if you are unable to keep an appointment. What may interact with this medicine? -cyclosporine -diazepam -ketoconazole -medicines to increase blood counts like filgrastim, pegfilgrastim, sargramostim -other chemotherapy drugs like cisplatin, doxorubicin, epirubicin, etoposide, teniposide, vincristine -quinidine -testosterone -vaccines -verapamil Talk to your doctor or health care professional before taking any of these medicines: -acetaminophen -aspirin -ibuprofen -ketoprofen -naproxen This list may not describe all possible interactions. Give your health care provider a list of all the medicines, herbs, non-prescription drugs, or dietary supplements you use. Also tell them if you smoke, drink alcohol, or use illegal drugs. Some items may  interact with your medicine. What should I watch for while using this medicine? Your condition will be monitored carefully while you are receiving this medicine. You will need important blood work done while you are  taking this medicine. This drug may make you feel generally unwell. This is not uncommon, as chemotherapy can affect healthy cells as well as cancer cells. Report any side effects. Continue your course of treatment even though you feel ill unless your doctor tells you to stop. In some cases, you may be given additional medicines to help with side effects. Follow all directions for their use. Call your doctor or health care professional for advice if you get a fever, chills or sore throat, or other symptoms of a cold or flu. Do not treat yourself. This drug decreases your body's ability to fight infections. Try to avoid being around people who are sick. This medicine may increase your risk to bruise or bleed. Call your doctor or health care professional if you notice any unusual bleeding. Be careful brushing and flossing your teeth or using a toothpick because you may get an infection or bleed more easily. If you have any dental work done, tell your dentist you are receiving this medicine. Avoid taking products that contain aspirin, acetaminophen, ibuprofen, naproxen, or ketoprofen unless instructed by your doctor. These medicines may hide a fever. Do not become pregnant while taking this medicine. Women should inform their doctor if they wish to become pregnant or think they might be pregnant. There is a potential for serious side effects to an unborn child. Talk to your health care professional or pharmacist for more information. Do not breast-feed an infant while taking this medicine. Men are advised not to father a child while receiving this medicine. What side effects may I notice from receiving this medicine? Side effects that you should report to your doctor or health care professional as soon as possible: -allergic reactions like skin rash, itching or hives, swelling of the face, lips, or tongue -low blood counts - This drug may decrease the number of white blood cells, red blood cells and  platelets. You may be at increased risk for infections and bleeding. -signs of infection - fever or chills, cough, sore throat, pain or difficulty passing urine -signs of decreased platelets or bleeding - bruising, pinpoint red spots on the skin, black, tarry stools, nosebleeds -signs of decreased red blood cells - unusually weak or tired, fainting spells, lightheadedness -breathing problems -changes in vision -chest pain -high or low blood pressure -mouth sores -nausea and vomiting -pain, swelling, redness or irritation at the injection site -pain, tingling, numbness in the hands or feet -slow or irregular heartbeat -swelling of the ankle, feet, hands Side effects that usually do not require medical attention (report to your doctor or health care professional if they continue or are bothersome): -aches, pains -changes in the color of fingernails -diarrhea -hair loss -loss of appetite This list may not describe all possible side effects. Call your doctor for medical advice about side effects. You may report side effects to FDA at 1-800-FDA-1088. Where should I keep my medicine? This drug is given in a hospital or clinic and will not be stored at home. NOTE: This sheet is a summary. It may not cover all possible information. If you have questions about this medicine, talk to your doctor, pharmacist, or health care provider.  2015, Elsevier/Gold Standard. (2012-12-12 16:48:50)

## 2015-03-03 LAB — PREALBUMIN: Prealbumin: 28 mg/dL (ref 17–34)

## 2015-03-03 LAB — CANCER ANTIGEN 19-9: CA 19 9: 3798.8 U/mL — AB (ref <35.0)

## 2015-03-07 ENCOUNTER — Encounter (HOSPITAL_BASED_OUTPATIENT_CLINIC_OR_DEPARTMENT_OTHER): Payer: Self-pay

## 2015-03-07 ENCOUNTER — Ambulatory Visit (HOSPITAL_BASED_OUTPATIENT_CLINIC_OR_DEPARTMENT_OTHER)
Admission: RE | Admit: 2015-03-07 | Discharge: 2015-03-07 | Disposition: A | Discharge status: Home / Self Care | Payer: Medicare Other | Source: Ambulatory Visit | Attending: Family | Admitting: Family

## 2015-03-07 ENCOUNTER — Other Ambulatory Visit: Payer: Self-pay | Admitting: Nurse Practitioner

## 2015-03-07 DIAGNOSIS — C7800 Secondary malignant neoplasm of unspecified lung: Principal | ICD-10-CM

## 2015-03-07 DIAGNOSIS — C259 Malignant neoplasm of pancreas, unspecified: Secondary | ICD-10-CM | POA: Diagnosis not present

## 2015-03-07 DIAGNOSIS — R109 Unspecified abdominal pain: Secondary | ICD-10-CM | POA: Diagnosis not present

## 2015-03-07 DIAGNOSIS — Z79899 Other long term (current) drug therapy: Secondary | ICD-10-CM | POA: Diagnosis not present

## 2015-03-07 MED ORDER — IOHEXOL 300 MG/ML  SOLN
100.0000 mL | Freq: Once | INTRAMUSCULAR | Status: AC | PRN
  Administered 2015-03-07: 100 mL via INTRAVENOUS

## 2015-03-07 NOTE — Discharge Instructions (Signed)
° ° °  Outpatient Metformin Instructions (Glucophage, Glucovance, Fortamet, Riomet, Metaglip, Glumetza, Actoplus met  Avandamet, Janumet)   Patient: Jacqueline Hahn                                                03/07/2015:    Radiology Exam:     As part of your exam today in the Radiology Department, you were given a radiographic contrast material or x-ray dye.  Because you have had this contrast material and you are taking a Metformin drug (Glucophage, Glucovance, Avandamet, Fortamet, Riomet, Metaglip, Glumetza, Actoplus met, Actoplus Met XR, Prandimet or Janumet), please observe the following instructions:   DO NOT  Take this medication for 48 hours after your exam.  Because you have normal renal function and have no comorbidities, you may restart your medication in 48 hours with no need for a renal function test or consultation with your physician.  You have normal renal function but have some comorbidities.  Comorbidities include liver disease, alcohol overuse, heart failure, myocardial or muscular ischemia, sepsis, or other severe infection.  Therefore you should consult your physician before restarting your medication.  You have impaired renal function.  You should consult your physician before restarting your medication and you are advised to get a renal function test before restarting your medication.  Please discuss this with your physician.   Call your doctor before you start taking this medication again.  Your doctor may want to check your kidney function before you start taking this medication again.  I understand these instructions and have had an opportunity to discuss them with Radiology Department personnel.

## 2015-03-08 ENCOUNTER — Ambulatory Visit (HOSPITAL_BASED_OUTPATIENT_CLINIC_OR_DEPARTMENT_OTHER): Payer: Medicare Other

## 2015-03-08 ENCOUNTER — Other Ambulatory Visit (HOSPITAL_BASED_OUTPATIENT_CLINIC_OR_DEPARTMENT_OTHER): Payer: Medicare Other

## 2015-03-08 VITALS — BP 153/78 | HR 85 | Temp 98.8°F | Resp 16

## 2015-03-08 DIAGNOSIS — C259 Malignant neoplasm of pancreas, unspecified: Secondary | ICD-10-CM

## 2015-03-08 DIAGNOSIS — C7800 Secondary malignant neoplasm of unspecified lung: Principal | ICD-10-CM

## 2015-03-08 DIAGNOSIS — Z5111 Encounter for antineoplastic chemotherapy: Secondary | ICD-10-CM

## 2015-03-08 DIAGNOSIS — C250 Malignant neoplasm of head of pancreas: Secondary | ICD-10-CM

## 2015-03-08 DIAGNOSIS — K8689 Other specified diseases of pancreas: Secondary | ICD-10-CM

## 2015-03-08 LAB — CBC WITH DIFFERENTIAL (CANCER CENTER ONLY)
BASO#: 0 10*3/uL (ref 0.0–0.2)
BASO%: 0.4 % (ref 0.0–2.0)
EOS ABS: 0.1 10*3/uL (ref 0.0–0.5)
EOS%: 1.4 % (ref 0.0–7.0)
HCT: 35.8 % (ref 34.8–46.6)
HGB: 11.6 g/dL (ref 11.6–15.9)
LYMPH#: 3.4 10*3/uL — ABNORMAL HIGH (ref 0.9–3.3)
LYMPH%: 40.2 % (ref 14.0–48.0)
MCH: 24.2 pg — ABNORMAL LOW (ref 26.0–34.0)
MCHC: 32.4 g/dL (ref 32.0–36.0)
MCV: 75 fL — ABNORMAL LOW (ref 81–101)
MONO#: 0.7 10*3/uL (ref 0.1–0.9)
MONO%: 8.7 % (ref 0.0–13.0)
NEUT#: 4.2 10*3/uL (ref 1.5–6.5)
NEUT%: 49.3 % (ref 39.6–80.0)
Platelets: 166 10*3/uL (ref 145–400)
RBC: 4.8 10*6/uL (ref 3.70–5.32)
RDW: 17.9 % — ABNORMAL HIGH (ref 11.1–15.7)
WBC: 8.5 10*3/uL (ref 3.9–10.0)

## 2015-03-08 LAB — CMP (CANCER CENTER ONLY)
ALT(SGPT): 39 U/L (ref 10–47)
AST: 34 U/L (ref 11–38)
Albumin: 3.7 g/dL (ref 3.3–5.5)
Alkaline Phosphatase: 82 U/L (ref 26–84)
BILIRUBIN TOTAL: 0.4 mg/dL (ref 0.20–1.60)
BUN: 14 mg/dL (ref 7–22)
CO2: 25 mEq/L (ref 18–33)
CREATININE: 0.9 mg/dL (ref 0.6–1.2)
Calcium: 9.7 mg/dL (ref 8.0–10.3)
Chloride: 104 mEq/L (ref 98–108)
GLUCOSE: 151 mg/dL — AB (ref 73–118)
Potassium: 4 mEq/L (ref 3.3–4.7)
Sodium: 140 mEq/L (ref 128–145)
Total Protein: 7.8 g/dL (ref 6.4–8.1)

## 2015-03-08 LAB — TECHNOLOGIST REVIEW CHCC SATELLITE

## 2015-03-08 MED ORDER — PACLITAXEL PROTEIN-BOUND CHEMO INJECTION 100 MG
100.0000 mg/m2 | Freq: Once | INTRAVENOUS | Status: AC
  Administered 2015-03-08: 150 mg via INTRAVENOUS
  Filled 2015-03-08: qty 30

## 2015-03-08 MED ORDER — SODIUM CHLORIDE 0.9 % IJ SOLN
10.0000 mL | INTRAMUSCULAR | Status: DC | PRN
  Filled 2015-03-08: qty 10

## 2015-03-08 MED ORDER — ALTEPLASE 2 MG IJ SOLR
INTRAMUSCULAR | Status: AC
  Filled 2015-03-08: qty 2

## 2015-03-08 MED ORDER — HEPARIN SOD (PORK) LOCK FLUSH 100 UNIT/ML IV SOLN
500.0000 [IU] | Freq: Once | INTRAVENOUS | Status: DC | PRN
  Filled 2015-03-08: qty 5

## 2015-03-08 MED ORDER — SODIUM CHLORIDE 0.9 % IV SOLN
1216.0000 mg | Freq: Once | INTRAVENOUS | Status: AC
  Administered 2015-03-08: 1216 mg via INTRAVENOUS
  Filled 2015-03-08: qty 26.72

## 2015-03-08 MED ORDER — ALTEPLASE 2 MG IJ SOLR
2.0000 mg | Freq: Once | INTRAMUSCULAR | Status: AC | PRN
  Administered 2015-03-08: 2 mg
  Filled 2015-03-08: qty 2

## 2015-03-08 MED ORDER — SODIUM CHLORIDE 0.9 % IJ SOLN
3.0000 mL | INTRAMUSCULAR | Status: DC | PRN
  Filled 2015-03-08: qty 10

## 2015-03-08 MED ORDER — SODIUM CHLORIDE 0.9 % IV SOLN
Freq: Once | INTRAVENOUS | Status: AC
  Administered 2015-03-08: 11:00:00 via INTRAVENOUS
  Filled 2015-03-08: qty 4

## 2015-03-08 MED ORDER — SODIUM CHLORIDE 0.9 % IV SOLN: Freq: Once | INTRAVENOUS | Status: DC

## 2015-03-08 MED ORDER — HEPARIN SOD (PORK) LOCK FLUSH 100 UNIT/ML IV SOLN
250.0000 [IU] | Freq: Once | INTRAVENOUS | Status: DC | PRN
  Filled 2015-03-08: qty 5

## 2015-03-08 NOTE — Patient Instructions (Signed)
Nanoparticle Albumin-Bound Paclitaxel injection What is this medicine? NANOPARTICLE ALBUMIN-BOUND PACLITAXEL (Na no PAHR ti kuhl al BYOO muhn-bound PAK li TAX el) is a chemotherapy drug. It targets fast dividing cells, like cancer cells, and causes these cells to die. This medicine is used to treat advanced breast cancer and advanced lung cancer. This medicine may be used for other purposes; ask your health care provider or pharmacist if you have questions. COMMON BRAND NAME(S): Abraxane What should I tell my health care provider before I take this medicine? They need to know if you have any of these conditions: -kidney disease -liver disease -low blood counts, like low platelets, red blood cells, or white blood cells -recent or ongoing radiation therapy -an unusual or allergic reaction to paclitaxel, albumin, other chemotherapy, other medicines, foods, dyes, or preservatives -pregnant or trying to get pregnant -breast-feeding How should I use this medicine? This drug is given as an infusion into a vein. It is administered in a hospital or clinic by a specially trained health care professional. Talk to your pediatrician regarding the use of this medicine in children. Special care may be needed. Overdosage: If you think you have taken too much of this medicine contact a poison control center or emergency room at once. NOTE: This medicine is only for you. Do not share this medicine with others. What if I miss a dose? It is important not to miss your dose. Call your doctor or health care professional if you are unable to keep an appointment. What may interact with this medicine? -cyclosporine -diazepam -ketoconazole -medicines to increase blood counts like filgrastim, pegfilgrastim, sargramostim -other chemotherapy drugs like cisplatin, doxorubicin, epirubicin, etoposide, teniposide, vincristine -quinidine -testosterone -vaccines -verapamil Talk to your doctor or health care professional  before taking any of these medicines: -acetaminophen -aspirin -ibuprofen -ketoprofen -naproxen This list may not describe all possible interactions. Give your health care provider a list of all the medicines, herbs, non-prescription drugs, or dietary supplements you use. Also tell them if you smoke, drink alcohol, or use illegal drugs. Some items may interact with your medicine. What should I watch for while using this medicine? Your condition will be monitored carefully while you are receiving this medicine. You will need important blood work done while you are taking this medicine. This drug may make you feel generally unwell. This is not uncommon, as chemotherapy can affect healthy cells as well as cancer cells. Report any side effects. Continue your course of treatment even though you feel ill unless your doctor tells you to stop. In some cases, you may be given additional medicines to help with side effects. Follow all directions for their use. Call your doctor or health care professional for advice if you get a fever, chills or sore throat, or other symptoms of a cold or flu. Do not treat yourself. This drug decreases your body's ability to fight infections. Try to avoid being around people who are sick. This medicine may increase your risk to bruise or bleed. Call your doctor or health care professional if you notice any unusual bleeding. Be careful brushing and flossing your teeth or using a toothpick because you may get an infection or bleed more easily. If you have any dental work done, tell your dentist you are receiving this medicine. Avoid taking products that contain aspirin, acetaminophen, ibuprofen, naproxen, or ketoprofen unless instructed by your doctor. These medicines may hide a fever. Do not become pregnant while taking this medicine. Women should inform their doctor if they wish  to become pregnant or think they might be pregnant. There is a potential for serious side effects to  an unborn child. Talk to your health care professional or pharmacist for more information. Do not breast-feed an infant while taking this medicine. Men are advised not to father a child while receiving this medicine. What side effects may I notice from receiving this medicine? Side effects that you should report to your doctor or health care professional as soon as possible: -allergic reactions like skin rash, itching or hives, swelling of the face, lips, or tongue -low blood counts - This drug may decrease the number of white blood cells, red blood cells and platelets. You may be at increased risk for infections and bleeding. -signs of infection - fever or chills, cough, sore throat, pain or difficulty passing urine -signs of decreased platelets or bleeding - bruising, pinpoint red spots on the skin, black, tarry stools, nosebleeds -signs of decreased red blood cells - unusually weak or tired, fainting spells, lightheadedness -breathing problems -changes in vision -chest pain -high or low blood pressure -mouth sores -nausea and vomiting -pain, swelling, redness or irritation at the injection site -pain, tingling, numbness in the hands or feet -slow or irregular heartbeat -swelling of the ankle, feet, hands Side effects that usually do not require medical attention (report to your doctor or health care professional if they continue or are bothersome): -aches, pains -changes in the color of fingernails -diarrhea -hair loss -loss of appetite This list may not describe all possible side effects. Call your doctor for medical advice about side effects. You may report side effects to FDA at 1-800-FDA-1088. Where should I keep my medicine? This drug is given in a hospital or clinic and will not be stored at home. NOTE: This sheet is a summary. It may not cover all possible information. If you have questions about this medicine, talk to your doctor, pharmacist, or health care provider.  2015,  Elsevier/Gold Standard. (2012-12-12 16:48:50) Gemcitabine injection What is this medicine? GEMCITABINE (jem SIT a been) is a chemotherapy drug. This medicine is used to treat many types of cancer like breast cancer, lung cancer, pancreatic cancer, and ovarian cancer. This medicine may be used for other purposes; ask your health care provider or pharmacist if you have questions. COMMON BRAND NAME(S): Gemzar What should I tell my health care provider before I take this medicine? They need to know if you have any of these conditions: -blood disorders -infection -kidney disease -liver disease -recent or ongoing radiation therapy -an unusual or allergic reaction to gemcitabine, other chemotherapy, other medicines, foods, dyes, or preservatives -pregnant or trying to get pregnant -breast-feeding How should I use this medicine? This drug is given as an infusion into a vein. It is administered in a hospital or clinic by a specially trained health care professional. Talk to your pediatrician regarding the use of this medicine in children. Special care may be needed. Overdosage: If you think you have taken too much of this medicine contact a poison control center or emergency room at once. NOTE: This medicine is only for you. Do not share this medicine with others. What if I miss a dose? It is important not to miss your dose. Call your doctor or health care professional if you are unable to keep an appointment. What may interact with this medicine? -medicines to increase blood counts like filgrastim, pegfilgrastim, sargramostim -some other chemotherapy drugs like cisplatin -vaccines Talk to your doctor or health care professional before taking any  of these medicines: -acetaminophen -aspirin -ibuprofen -ketoprofen -naproxen This list may not describe all possible interactions. Give your health care provider a list of all the medicines, herbs, non-prescription drugs, or dietary supplements you  use. Also tell them if you smoke, drink alcohol, or use illegal drugs. Some items may interact with your medicine. What should I watch for while using this medicine? Visit your doctor for checks on your progress. This drug may make you feel generally unwell. This is not uncommon, as chemotherapy can affect healthy cells as well as cancer cells. Report any side effects. Continue your course of treatment even though you feel ill unless your doctor tells you to stop. In some cases, you may be given additional medicines to help with side effects. Follow all directions for their use. Call your doctor or health care professional for advice if you get a fever, chills or sore throat, or other symptoms of a cold or flu. Do not treat yourself. This drug decreases your body's ability to fight infections. Try to avoid being around people who are sick. This medicine may increase your risk to bruise or bleed. Call your doctor or health care professional if you notice any unusual bleeding. Be careful brushing and flossing your teeth or using a toothpick because you may get an infection or bleed more easily. If you have any dental work done, tell your dentist you are receiving this medicine. Avoid taking products that contain aspirin, acetaminophen, ibuprofen, naproxen, or ketoprofen unless instructed by your doctor. These medicines may hide a fever. Women should inform their doctor if they wish to become pregnant or think they might be pregnant. There is a potential for serious side effects to an unborn child. Talk to your health care professional or pharmacist for more information. Do not breast-feed an infant while taking this medicine. What side effects may I notice from receiving this medicine? Side effects that you should report to your doctor or health care professional as soon as possible: -allergic reactions like skin rash, itching or hives, swelling of the face, lips, or tongue -low blood counts - this  medicine may decrease the number of white blood cells, red blood cells and platelets. You may be at increased risk for infections and bleeding. -signs of infection - fever or chills, cough, sore throat, pain or difficulty passing urine -signs of decreased platelets or bleeding - bruising, pinpoint red spots on the skin, black, tarry stools, blood in the urine -signs of decreased red blood cells - unusually weak or tired, fainting spells, lightheadedness -breathing problems -chest pain -mouth sores -nausea and vomiting -pain, swelling, redness at site where injected -pain, tingling, numbness in the hands or feet -stomach pain -swelling of ankles, feet, hands -unusual bleeding Side effects that usually do not require medical attention (report to your doctor or health care professional if they continue or are bothersome): -constipation -diarrhea -hair loss -loss of appetite -stomach upset This list may not describe all possible side effects. Call your doctor for medical advice about side effects. You may report side effects to FDA at 1-800-FDA-1088. Where should I keep my medicine? This drug is given in a hospital or clinic and will not be stored at home. NOTE: This sheet is a summary. It may not cover all possible information. If you have questions about this medicine, talk to your doctor, pharmacist, or health care provider.  2015, Elsevier/Gold Standard. (2008-02-28 18:45:54)

## 2015-03-15 ENCOUNTER — Ambulatory Visit: Payer: Medicare Other

## 2015-03-15 ENCOUNTER — Ambulatory Visit: Payer: Medicare Other | Admitting: Hematology & Oncology

## 2015-03-15 ENCOUNTER — Other Ambulatory Visit: Payer: Medicare Other

## 2015-03-21 ENCOUNTER — Other Ambulatory Visit: Payer: Self-pay | Admitting: *Deleted

## 2015-03-21 DIAGNOSIS — C259 Malignant neoplasm of pancreas, unspecified: Secondary | ICD-10-CM

## 2015-03-21 DIAGNOSIS — C7800 Secondary malignant neoplasm of unspecified lung: Principal | ICD-10-CM

## 2015-03-22 ENCOUNTER — Ambulatory Visit (HOSPITAL_BASED_OUTPATIENT_CLINIC_OR_DEPARTMENT_OTHER): Payer: Medicare Other | Admitting: Hematology & Oncology

## 2015-03-22 ENCOUNTER — Other Ambulatory Visit (HOSPITAL_BASED_OUTPATIENT_CLINIC_OR_DEPARTMENT_OTHER): Payer: Medicare Other

## 2015-03-22 ENCOUNTER — Ambulatory Visit: Payer: Medicare Other

## 2015-03-22 ENCOUNTER — Ambulatory Visit (HOSPITAL_BASED_OUTPATIENT_CLINIC_OR_DEPARTMENT_OTHER): Payer: Medicare Other

## 2015-03-22 ENCOUNTER — Ambulatory Visit: Payer: Medicare Other | Admitting: Hematology & Oncology

## 2015-03-22 ENCOUNTER — Encounter: Payer: Self-pay | Admitting: Hematology & Oncology

## 2015-03-22 ENCOUNTER — Other Ambulatory Visit: Payer: Medicare Other

## 2015-03-22 VITALS — BP 150/79 | HR 111 | Temp 98.1°F | Resp 14 | Ht 60.0 in | Wt 138.0 lb

## 2015-03-22 VITALS — BP 124/72 | HR 80

## 2015-03-22 DIAGNOSIS — C7951 Secondary malignant neoplasm of bone: Secondary | ICD-10-CM

## 2015-03-22 DIAGNOSIS — C251 Malignant neoplasm of body of pancreas: Secondary | ICD-10-CM | POA: Diagnosis not present

## 2015-03-22 DIAGNOSIS — K8689 Other specified diseases of pancreas: Secondary | ICD-10-CM

## 2015-03-22 DIAGNOSIS — R978 Other abnormal tumor markers: Secondary | ICD-10-CM | POA: Diagnosis not present

## 2015-03-22 DIAGNOSIS — C259 Malignant neoplasm of pancreas, unspecified: Secondary | ICD-10-CM

## 2015-03-22 DIAGNOSIS — C7800 Secondary malignant neoplasm of unspecified lung: Principal | ICD-10-CM

## 2015-03-22 DIAGNOSIS — Z5111 Encounter for antineoplastic chemotherapy: Secondary | ICD-10-CM

## 2015-03-22 LAB — CBC WITH DIFFERENTIAL (CANCER CENTER ONLY)
BASO#: 0.1 10*3/uL (ref 0.0–0.2)
BASO%: 0.6 % (ref 0.0–2.0)
EOS%: 1.9 % (ref 0.0–7.0)
Eosinophils Absolute: 0.3 10*3/uL (ref 0.0–0.5)
HEMATOCRIT: 36.4 % (ref 34.8–46.6)
HGB: 12.2 g/dL (ref 11.6–15.9)
LYMPH#: 4.5 10*3/uL — AB (ref 0.9–3.3)
LYMPH%: 32.5 % (ref 14.0–48.0)
MCH: 24.5 pg — ABNORMAL LOW (ref 26.0–34.0)
MCHC: 33.5 g/dL (ref 32.0–36.0)
MCV: 73 fL — ABNORMAL LOW (ref 81–101)
MONO#: 2 10*3/uL — ABNORMAL HIGH (ref 0.1–0.9)
MONO%: 14.5 % — ABNORMAL HIGH (ref 0.0–13.0)
NEUT%: 50.5 % (ref 39.6–80.0)
NEUTROS ABS: 7 10*3/uL — AB (ref 1.5–6.5)
Platelets: 628 10*3/uL — ABNORMAL HIGH (ref 145–400)
RBC: 4.98 10*6/uL (ref 3.70–5.32)
RDW: 18.3 % — AB (ref 11.1–15.7)
WBC: 13.9 10*3/uL — ABNORMAL HIGH (ref 3.9–10.0)

## 2015-03-22 LAB — CMP (CANCER CENTER ONLY)
ALT: 33 U/L (ref 10–47)
AST: 28 U/L (ref 11–38)
Albumin: 3.6 g/dL (ref 3.3–5.5)
Alkaline Phosphatase: 101 U/L — ABNORMAL HIGH (ref 26–84)
BUN, Bld: 10 mg/dL (ref 7–22)
CHLORIDE: 108 meq/L (ref 98–108)
CO2: 23 mEq/L (ref 18–33)
CREATININE: 0.8 mg/dL (ref 0.6–1.2)
Calcium: 9.9 mg/dL (ref 8.0–10.3)
Glucose, Bld: 118 mg/dL (ref 73–118)
Potassium: 3.7 mEq/L (ref 3.3–4.7)
SODIUM: 141 meq/L (ref 128–145)
Total Bilirubin: 0.6 mg/dl (ref 0.20–1.60)
Total Protein: 8 g/dL (ref 6.4–8.1)

## 2015-03-22 MED ORDER — GEMCITABINE HCL CHEMO INJECTION 1 GM/26.3ML
800.0000 mg/m2 | Freq: Once | INTRAVENOUS | Status: AC
  Administered 2015-03-22: 1292 mg via INTRAVENOUS
  Filled 2015-03-22: qty 33.98

## 2015-03-22 MED ORDER — SODIUM CHLORIDE 0.9 % IV SOLN
Freq: Once | INTRAVENOUS | Status: AC
  Administered 2015-03-22: 12:00:00 via INTRAVENOUS
  Filled 2015-03-22: qty 4

## 2015-03-22 MED ORDER — SODIUM CHLORIDE 0.9 % IV SOLN
Freq: Once | INTRAVENOUS | Status: AC
  Administered 2015-03-22: 12:00:00 via INTRAVENOUS

## 2015-03-22 MED ORDER — PACLITAXEL PROTEIN-BOUND CHEMO INJECTION 100 MG
100.0000 mg/m2 | Freq: Once | INTRAVENOUS | Status: AC
  Administered 2015-03-22: 150 mg via INTRAVENOUS
  Filled 2015-03-22: qty 30

## 2015-03-22 MED ORDER — SODIUM CHLORIDE 0.9 % IJ SOLN
10.0000 mL | INTRAMUSCULAR | Status: DC | PRN
Start: 2015-03-22 — End: 2015-03-22
  Administered 2015-03-22: 10 mL
  Filled 2015-03-22: qty 10

## 2015-03-22 MED ORDER — HEPARIN SOD (PORK) LOCK FLUSH 100 UNIT/ML IV SOLN
500.0000 [IU] | Freq: Once | INTRAVENOUS | Status: AC | PRN
  Administered 2015-03-22: 500 [IU]
  Filled 2015-03-22: qty 5

## 2015-03-22 NOTE — Patient Instructions (Signed)
[facility] Discharge Instructions for Patients Receiving Chemotherapy  Today you received the following chemotherapy agents:  Abraxane, Gemzar.   To help prevent nausea and vomiting after your treatment, we encourage you to take your nausea medication.   If you develop nausea and vomiting that is not controlled by your nausea medication, call the clinic.   BELOW ARE SYMPTOMS THAT SHOULD BE REPORTED IMMEDIATELY:  *FEVER GREATER THAN 100.5 F  *CHILLS WITH OR WITHOUT FEVER  NAUSEA AND VOMITING THAT IS NOT CONTROLLED WITH YOUR NAUSEA MEDICATION  *UNUSUAL SHORTNESS OF BREATH  *UNUSUAL BRUISING OR BLEEDING  TENDERNESS IN MOUTH AND THROAT WITH OR WITHOUT PRESENCE OF ULCERS  *URINARY PROBLEMS  *BOWEL PROBLEMS  UNUSUAL RASH Items with * indicate a potential emergency and should be followed up as soon as possible.  Feel free to call the clinic you have any questions or concerns. The clinic phone number is [phone].  Please show the CHEMO ALERT CARD at check-in to the Emergency Department and triage nurse.   

## 2015-03-22 NOTE — Progress Notes (Signed)
Hematology and Oncology Follow Up Visit  Jacqueline Hahn [id number] 03-May-1946 69 y.o. 03/22/2015   Principle Diagnosis:  Metastatic adenocarcinoma of the pancreas  Current Therapy:   Status post 8 cycles of gemcitabine/Abraxane Zometa 4 mg IV every month    Interim History: Jacqueline Hahn is back for follow-up. Cheek disease looked good. She sees me pretty well. Her weight is up.  We did go ahead and repeat a CT scan on her. This, in essence, showed stable disease. She does have the mass the pancreas which is a little bit larger. And now measures 1.8 x 1.9 cm. She has some subcentimeter lesion in the liver looked looks the same. She has some lung lesions which also appear to be stable. There is no evidence of ascites. There is no peritoneal type disease.  Her CA 19-9 however has been going up. We did have her down to 490 back in February. However, she's been going up gradually. In April, she was up to 3800. As such, I think this is an indicator of that she does have "active" disease and that we have to make some treatment changes.  She is going to the bathroom okay. She does have some constipation but takes care of this with over-the-counter medicines.  There is no pain. She's had no fever. She's had no bleeding. She's had no leg swelling.   Her performance status is ECOG 1.  Medications:    Medication List       This list is accurate as of: 03/22/15  5:51 PM.  Always use your most recent med list.               amLODipine 10 MG tablet  Commonly known as:  NORVASC  Take 1 tablet (10 mg total) by mouth daily.     CALCIUM + D PO  Take 1 tablet by mouth daily.     FLAX PO  Take 1 tablet by mouth daily.     glipiZIDE-metformin 5-500 MG per tablet  Commonly known as:  METAGLIP  Take 1 tablet by mouth 2 (two) times daily before a meal.     lansoprazole 15 MG capsule  Commonly known as:  PREVACID  Take 15 mg by mouth daily as needed (for heartburn).     lidocaine-prilocaine cream    Commonly known as:  EMLA  Apply 1 application topically as needed. Apply quarter sized amount to portacath site at least one hour prior to chemo treatment     megestrol 400 MG/10ML suspension  Commonly known as:  MEGACE  Take 800 mg by mouth daily.     multivitamin with minerals Tabs tablet  Take 1 tablet by mouth daily.     pyridOXINE 100 MG tablet  Commonly known as:  VITAMIN B-6  Take 100 mg by mouth daily.     vitamin B-6 250 MG tablet  Take 1 tablet (250 mg total) by mouth daily.     vitamin A 8000 UNIT capsule  Take 8,000 Units by mouth daily.     vitamin E 400 UNIT capsule  Take 400 Units by mouth daily.        Allergies: No Known Allergies  Past Medical History, Surgical history, Social history, and Family History were reviewed and updated.  Review of Systems: All other 10 point review of systems is negative.   Physical Exam:  height is 5' (1.524 m) and weight is 138 lb (62.596 kg). Her oral temperature is 98.1 F (36.7 C). Her blood pressure  is 150/79 and her pulse is 111. Her respiration is 14.   Wt Readings from Last 3 Encounters:  03/22/15 138 lb (62.596 kg)  02/22/15 136 lb (61.689 kg)  01/25/15 136 lb (61.689 kg)    Head and neck exam shows no ocular or oral lesions. She has no scleral icterus. Lungs are clear bilaterally. Cardiac exam regular rate and rhythm with no murmurs, rubs or bruits. Abdomen is soft. She has good bowel sounds. There is no fluid wave. There is no palpable liver or spleen tip. Back exam shows no tenderness over the spine, ribs or hips. Extremities shows no clubbing, cyanosis or edema. She has good range of motion of her joints. Skin exam shows no rashes, ecchymoses or petechia. Neurological exam shows no focal neurological deficits. Lab Results  Component Value Date   WBC 13.9* 03/22/2015   HGB 12.2 03/22/2015   HCT 36.4 03/22/2015   MCV 73* 03/22/2015   PLT 628* 03/22/2015   Lab Results  Component Value Date   FERRITIN 205  06/25/2014   IRON 44 06/25/2014   TIBC 301 06/25/2014   UIBC 257 06/25/2014   IRONPCTSAT 15* 06/25/2014   Lab Results  Component Value Date   RBC 4.98 03/22/2015   No results found for: KPAFRELGTCHN, LAMBDASER, KAPLAMBRATIO No results found for: IGGSERUM, IGA, IGMSERUM No results found for: Odetta Pink, SPEI   Chemistry      Component Value Date/Time   NA 141 03/22/2015 1043   NA 136* 09/01/2014 1148   K 3.7 03/22/2015 1043   K 3.5* 09/01/2014 1148   CL 108 03/22/2015 1043   CL 102 09/01/2014 1148   CO2 23 03/22/2015 1043   CO2 19 09/01/2014 1148   BUN 10 03/22/2015 1043   BUN 11 09/01/2014 1148   CREATININE 0.8 03/22/2015 1043   CREATININE 0.64 09/01/2014 1148      Component Value Date/Time   CALCIUM 9.9 03/22/2015 1043   CALCIUM 9.3 09/01/2014 1148   ALKPHOS 101* 03/22/2015 1043   ALKPHOS 121* 09/01/2014 1148   AST 28 03/22/2015 1043   AST 48* 09/01/2014 1148   ALT 33 03/22/2015 1043   ALT 85* 09/01/2014 1148   BILITOT 0.60 03/22/2015 1043   BILITOT 0.6 09/01/2014 1148     Impression and Plan: Jacqueline Hahn is 69 year old female. She is metastatic adenocarcinoma of pancreas.   I by the scans, she has stable disease. By the CA 19-9, her disease appears to be "active".  I think it might be a good idea if we could add low-dose cis-platinum to the chemotherapy regimen. There was a study that seemed to show that this was synergistic and did help patients with achieving a better response.  I talked to Jacqueline Hahn about this. She certainly is up for this. She has a good performance status so she could handle this.  We will go ahead and get this started after she gets back from her vacation. She wants go down to [facility] for [place] weekend.  We will hold on her day 8 of chemotherapy next week.  I will plan to see her back for her 10th cycle of treatment and at that point, we can add the cis-platinum.  I spent about  30 minutes with her today talking to about a change in her protocol. [name], MD 5/20/20165:51 PM

## 2015-04-10 ENCOUNTER — Emergency Department (HOSPITAL_COMMUNITY)
Admission: EM | Admit: 2015-04-10 | Discharge: 2015-04-10 | Disposition: A | Discharge status: Home / Self Care | Payer: Medicare Other | Attending: Emergency Medicine | Admitting: Emergency Medicine

## 2015-04-10 ENCOUNTER — Telehealth: Payer: Self-pay | Admitting: *Deleted

## 2015-04-10 ENCOUNTER — Encounter (HOSPITAL_COMMUNITY): Payer: Self-pay | Admitting: *Deleted

## 2015-04-10 DIAGNOSIS — Z8719 Personal history of other diseases of the digestive system: Secondary | ICD-10-CM | POA: Insufficient documentation

## 2015-04-10 DIAGNOSIS — N390 Urinary tract infection, site not specified: Secondary | ICD-10-CM | POA: Insufficient documentation

## 2015-04-10 DIAGNOSIS — C259 Malignant neoplasm of pancreas, unspecified: Secondary | ICD-10-CM

## 2015-04-10 DIAGNOSIS — Z8507 Personal history of malignant neoplasm of pancreas: Secondary | ICD-10-CM | POA: Insufficient documentation

## 2015-04-10 DIAGNOSIS — R109 Unspecified abdominal pain: Secondary | ICD-10-CM | POA: Diagnosis present

## 2015-04-10 DIAGNOSIS — I10 Essential (primary) hypertension: Secondary | ICD-10-CM | POA: Diagnosis not present

## 2015-04-10 DIAGNOSIS — C7800 Secondary malignant neoplasm of unspecified lung: Secondary | ICD-10-CM | POA: Diagnosis not present

## 2015-04-10 DIAGNOSIS — Z79899 Other long term (current) drug therapy: Secondary | ICD-10-CM | POA: Diagnosis not present

## 2015-04-10 DIAGNOSIS — E119 Type 2 diabetes mellitus without complications: Secondary | ICD-10-CM | POA: Insufficient documentation

## 2015-04-10 DIAGNOSIS — Z87891 Personal history of nicotine dependence: Secondary | ICD-10-CM | POA: Insufficient documentation

## 2015-04-10 DIAGNOSIS — Z7982 Long term (current) use of aspirin: Secondary | ICD-10-CM | POA: Diagnosis not present

## 2015-04-10 LAB — URINALYSIS, ROUTINE W REFLEX MICROSCOPIC
Glucose, UA: NEGATIVE mg/dL
Hgb urine dipstick: NEGATIVE
Ketones, ur: 15 mg/dL — AB
Nitrite: POSITIVE — AB
PROTEIN: 100 mg/dL — AB
SPECIFIC GRAVITY, URINE: 1.03 (ref 1.005–1.030)
Urobilinogen, UA: 2 mg/dL — ABNORMAL HIGH (ref 0.0–1.0)
pH: 6 (ref 5.0–8.0)

## 2015-04-10 LAB — BASIC METABOLIC PANEL
Anion gap: 13 (ref 5–15)
BUN: 7 mg/dL (ref 6–20)
CALCIUM: 9.4 mg/dL (ref 8.9–10.3)
CO2: 21 mmol/L — AB (ref 22–32)
Chloride: 102 mmol/L (ref 101–111)
Creatinine, Ser: 0.73 mg/dL (ref 0.44–1.00)
GFR calc Af Amer: >60 mL/min (ref >60)
GLUCOSE: 223 mg/dL — AB (ref 65–99)
Potassium: 3.4 mmol/L — ABNORMAL LOW (ref 3.5–5.1)
Sodium: 136 mmol/L (ref 135–145)

## 2015-04-10 LAB — CBC WITH DIFFERENTIAL/PLATELET
BASOS ABS: 0 10*3/uL (ref 0.0–0.1)
BASOS PCT: 0 % (ref 0–1)
EOS ABS: 0.2 10*3/uL (ref 0.0–0.7)
Eosinophils Relative: 1 % (ref 0–5)
HCT: 37.4 % (ref 36.0–46.0)
Hemoglobin: 12.5 g/dL (ref 12.0–15.0)
LYMPHS PCT: 22 % (ref 12–46)
Lymphs Abs: 3.3 10*3/uL (ref 0.7–4.0)
MCH: 23.7 pg — ABNORMAL LOW (ref 26.0–34.0)
MCHC: 33.4 g/dL (ref 30.0–36.0)
MCV: 70.8 fL — ABNORMAL LOW (ref 78.0–100.0)
MONO ABS: 2.4 10*3/uL — AB (ref 0.1–1.0)
MONOS PCT: 16 % — AB (ref 3–12)
NEUTROS PCT: 61 % (ref 43–77)
Neutro Abs: 9.3 10*3/uL — ABNORMAL HIGH (ref 1.7–7.7)
Platelets: 384 10*3/uL (ref 150–400)
RBC: 5.28 MIL/uL — AB (ref 3.87–5.11)
RDW: 17 % — ABNORMAL HIGH (ref 11.5–15.5)
WBC: 15.2 10*3/uL — AB (ref 4.0–10.5)

## 2015-04-10 LAB — URINE MICROSCOPIC-ADD ON

## 2015-04-10 LAB — CK: Total CK: 36 U/L — ABNORMAL LOW (ref 38–234)

## 2015-04-10 MED ORDER — CEPHALEXIN 250 MG PO CAPS
500.0000 mg | ORAL_CAPSULE | Freq: Once | ORAL | Status: AC
  Administered 2015-04-10: 500 mg via ORAL
  Filled 2015-04-10: qty 2

## 2015-04-10 MED ORDER — CEPHALEXIN 500 MG PO CAPS
500.0000 mg | ORAL_CAPSULE | Freq: Four times a day (QID) | ORAL | Status: DC
Start: 2015-04-10 — End: 2015-04-22

## 2015-04-10 NOTE — Discharge Instructions (Signed)

## 2015-04-10 NOTE — ED Notes (Signed)
Pt is in stable condition upon d/c and is escorted from ED by staff via wheelchair.

## 2015-04-10 NOTE — Telephone Encounter (Signed)
Patient c/o abdominal pain, slight fever, headache and urine with a dark color. She feels like she may have a bladder infection. Dr Marin Olp notified and orders for antibiotics received.   Attempted several times to call patient back regarding her new medication and response from MD but she is not answering phone. Received a call from [facility] ED at 1105 stating she had gone to the ED with report of symptoms she called with earlier. Notified Dr Marin Olp.

## 2015-04-10 NOTE — ED Provider Notes (Signed)
CSN: [id number]     Arrival date & time 04/10/15  0910 History   First MD Initiated Contact with Patient 04/10/15 [phone]     Chief Complaint  Patient presents with  . Abdominal Pain     (Consider location/radiation/quality/duration/timing/severity/associated sxs/prior Treatment) HPI   Jacqueline Hahn is a 69 y.o. female who presents for evaluation of change in color of urine, sensation of fever, lower abdominal discomfort, and general weakness. Symptoms have been present for 1 day. She last had chemotherapy about 2 weeks ago. She is being treated for pancreatic cancer. She denies weakness, dizziness, nausea, vomiting or problems walking. She came here by private vehicle. She saw her PCP recently. Her next chemotherapy is scheduled for 3 weeks from now. She did not eat breakfast or take her morning medicines today. She is currently hungry. There are no other known modifying factors.   Past Medical History  Diagnosis Date  . Hypertension   . UTI (urinary tract infection)   . History of stomach ulcers   . Diabetes mellitus without complication   . Complication of anesthesia     hx of n/v  . PONV (postoperative nausea and vomiting)   . Pancreatic cancer metastasized to lung 07/10/2014   Past Surgical History  Procedure Laterality Date  . Dental surgery    . Bunionectomy    . Ercp N/A 06/23/2014    Procedure: ENDOSCOPIC RETROGRADE CHOLANGIOPANCREATOGRAPHY (ERCP);  Surgeon: Ladene Artist, MD;  Location: [facility] ENDOSCOPY;  Service: Endoscopy;  Laterality: N/A;  . Eus N/A 06/29/2014    Procedure: UPPER ENDOSCOPIC ULTRASOUND (EUS) LINEAR;  Surgeon: Beryle Beams, MD;  Location: [facility] ENDOSCOPY;  Service: Endoscopy;  Laterality: N/A;  . Fine needle aspiration N/A 06/29/2014    Procedure: FINE NEEDLE ASPIRATION (FNA) LINEAR;  Surgeon: Beryle Beams, MD;  Location: [facility] ENDOSCOPY;  Service: Endoscopy;  Laterality: N/A;   Family History  Problem Relation Age of Onset  . Heart attack Mother   . Stroke Mother    . Heart attack Father   . Hyperlipidemia Sister   . Hypertension Sister   . Hypertension Brother   . Alcohol abuse Brother    History  Substance Use Topics  . Smoking status: Former Smoker -- 1.00 packs/day for 4 years    Types: Cigarettes    Start date: 02/03/1963    Quit date: 03/05/1967  . Smokeless tobacco: Never Used     Comment: quit 45 years ago  . Alcohol Use: No   OB History    No data available     Review of Systems  All other systems reviewed and are negative.     Allergies  Review of patient's allergies indicates no known allergies.  Home Medications   Prior to Admission medications   Medication Sig Start Date End Date Taking? Authorizing Provider  amLODipine (NORVASC) 10 MG tablet Take 1 tablet (10 mg total) by mouth daily. 10/10/14  Yes [name], MD  Aspirin-Salicylamide-Caffeine (BC HEADACHE POWDER PO) Take 1 Package by mouth daily as needed (headache).   Yes Historical Provider, MD  Calcium Carbonate-Vitamin D (CALCIUM + D PO) Take 1 tablet by mouth daily.   Yes Historical Provider, MD  Flaxseed, Linseed, (FLAX PO) Take 1 tablet by mouth daily.   Yes Historical Provider, MD  glipiZIDE-metformin (METAGLIP) 5-500 MG per tablet Take 1 tablet by mouth 2 (two) times daily before a meal. 02/22/15  Yes [name], MD  lansoprazole (PREVACID) 15 MG capsule Take 15 mg  by mouth daily as needed (for heartburn).    Yes Historical Provider, MD  lidocaine-prilocaine (EMLA) cream Apply 1 application topically as needed. Apply quarter sized amount to portacath site at least one hour prior to chemo treatment 07/10/14  Yes [name], MD  megestrol (MEGACE) 400 MG/10ML suspension Take 800 mg by mouth daily as needed (only uses when stomach is upset or feels stomach "needs" it).    Yes Historical Provider, MD  Multiple Vitamin (MULTIVITAMIN WITH MINERALS) TABS tablet Take 1 tablet by mouth daily.   Yes Historical Provider, MD  pyridOXINE (B-6) 50 MG tablet Take  250 mg by mouth daily.   Yes Historical Provider, MD  vitamin E 1000 UNIT capsule Take 1,000 Units by mouth daily.   Yes Historical Provider, MD  cephALEXin (KEFLEX) 500 MG capsule Take 1 capsule (500 mg total) by mouth 4 (four) times daily. 04/10/15   Daleen Bo, MD  Pyridoxine HCl (VITAMIN B-6) 250 MG tablet Take 1 tablet (250 mg total) by mouth daily. Patient not taking: Reported on 04/10/2015 02/22/15   [name], MD   BP 145/81 mmHg  Pulse 103  Temp(Src) 98.8 F (37.1 C) (Oral)  Resp 16  Ht 5' (1.524 m)  Wt 138 lb (62.596 kg)  BMI 26.95 kg/m2  SpO2 100% Physical Exam  Constitutional: She is oriented to person, place, and time. She appears well-developed and well-nourished.  HENT:  Head: Normocephalic and atraumatic.  Right Ear: External ear normal.  Left Ear: External ear normal.  Eyes: Conjunctivae and EOM are normal. Pupils are equal, round, and reactive to light.  Neck: Normal range of motion and phonation normal. Neck supple.  Cardiovascular: Normal rate, regular rhythm and normal heart sounds.   Pulmonary/Chest: Effort normal and breath sounds normal. No respiratory distress. She exhibits no bony tenderness.  Abdominal: Soft. She exhibits no distension. There is no tenderness. There is no rebound and no guarding.  Genitourinary:  No costovertebral angle tenderness.  Musculoskeletal: Normal range of motion.  Neurological: She is alert and oriented to person, place, and time. No cranial nerve deficit or sensory deficit. She exhibits normal muscle tone. Coordination normal.  Skin: Skin is warm, dry and intact.  Psychiatric: She has a normal mood and affect. Her behavior is normal. Judgment and thought content normal.  Nursing note and vitals reviewed.   ED Course  Procedures (including critical care time) Medications  cephALEXin (KEFLEX) capsule 500 mg (500 mg Oral Given 04/10/15 1220)    Patient Vitals for the past 24 hrs:  BP Temp Temp src Pulse Resp SpO2 Height  Weight  04/10/15 1215 145/81 mmHg - - 103 - 100 % - -  04/10/15 1214 145/81 mmHg 98.8 F (37.1 C) Oral 105 16 100 % - -  04/10/15 1200 128/61 mmHg - - 104 - 100 % - -  04/10/15 1145 128/68 mmHg - - 107 - 100 % - -  04/10/15 1130 143/69 mmHg - - 104 - 100 % - -  04/10/15 1115 135/61 mmHg - - 107 - 100 % - -  04/10/15 1100 150/74 mmHg - - 103 - 100 % - -  04/10/15 1045 151/78 mmHg - - 105 - 100 % - -  04/10/15 1030 145/74 mmHg - - 100 - 100 % - -  04/10/15 1015 155/78 mmHg - - 103 - 100 % - -  04/10/15 1000 158/78 mmHg - - 104 - 100 % - -  04/10/15 0945 (!) 157/111 mmHg - -  113 - 100 % - -  04/10/15 0926 - 98.4 F (36.9 C) Oral - - - 5' (1.524 m) 138 lb (62.596 kg)    12:04 PM Reevaluation with update and discussion. After initial assessment and treatment, an updated evaluation reveals no change in clinical status. Findings discussed with patient, all questions answered.Daleen Bo L     Labs Review Labs Reviewed  URINALYSIS, ROUTINE W REFLEX MICROSCOPIC (NOT AT [facility]) - Abnormal; Notable for the following:    Color, Urine AMBER (*)    APPearance CLOUDY (*)    Bilirubin Urine LARGE (*)    Ketones, ur 15 (*)    Protein, ur 100 (*)    Urobilinogen, UA 2.0 (*)    Nitrite POSITIVE (*)    Leukocytes, UA MODERATE (*)    All other components within normal limits  CBC WITH DIFFERENTIAL/PLATELET - Abnormal; Notable for the following:    WBC 15.2 (*)    RBC 5.28 (*)    MCV 70.8 (*)    MCH 23.7 (*)    RDW 17.0 (*)    Monocytes Relative 16 (*)    Neutro Abs 9.3 (*)    Monocytes Absolute 2.4 (*)    All other components within normal limits  BASIC METABOLIC PANEL - Abnormal; Notable for the following:    Potassium 3.4 (*)    CO2 21 (*)    Glucose, Bld 223 (*)    All other components within normal limits  CK - Abnormal; Notable for the following:    Total CK 36 (*)    All other components within normal limits  URINE MICROSCOPIC-ADD ON - Abnormal; Notable for the following:     Squamous Epithelial / LPF MANY (*)    Bacteria, UA MANY (*)    All other components within normal limits  URINE CULTURE    Imaging Review No results found.   EKG Interpretation None      MDM   Final diagnoses:  UTI (lower urinary tract infection)    Evaluation consistent with uncomplicated urinary tract infection. Doubt urosepsis, pyelonephritis, metabolic instability or serious bacterial infection.  Nursing Notes Reviewed/ Care Coordinated Applicable Imaging Reviewed Interpretation of Laboratory Data incorporated into ED treatment  The patient appears reasonably screened and/or stabilized for discharge and I doubt any other medical condition or other [facility] requiring further screening, evaluation, or treatment in the ED at this time prior to discharge.  Plan: Home Medications- Keflex; Home Treatments- rest, fluids; return here if the recommended treatment, does not improve the symptoms; Recommended follow up- PCP 1 week     Daleen Bo, MD 04/10/15 1314

## 2015-04-10 NOTE — ED Notes (Signed)
Pt states that on Sunday she began having abdominal pain and having a dark tea colored urine. Pt states her sx have been improving and she is feeling better. Pt states she was also running a fever, which has also resolved. Pt states she is scared and just wants to make sure she is ok since she has pancreatic cancer. Pt was dx in August 2015.

## 2015-04-11 LAB — URINE CULTURE
Colony Count: 9000
Special Requests: NORMAL

## 2015-04-15 ENCOUNTER — Other Ambulatory Visit: Payer: Self-pay | Admitting: *Deleted

## 2015-04-15 MED ORDER — SULFAMETHOXAZOLE-TRIMETHOPRIM 400-80 MG PO TABS: 1.0000 | ORAL_TABLET | Freq: Two times a day (BID) | ORAL | Status: DC

## 2015-04-19 ENCOUNTER — Encounter (HOSPITAL_COMMUNITY): Payer: Self-pay | Admitting: *Deleted

## 2015-04-19 ENCOUNTER — Other Ambulatory Visit (HOSPITAL_BASED_OUTPATIENT_CLINIC_OR_DEPARTMENT_OTHER): Payer: Medicare Other

## 2015-04-19 ENCOUNTER — Other Ambulatory Visit: Payer: Self-pay | Admitting: Radiation Oncology

## 2015-04-19 ENCOUNTER — Other Ambulatory Visit: Payer: Self-pay | Admitting: Family

## 2015-04-19 ENCOUNTER — Ambulatory Visit
Admission: RE | Admit: 2015-04-19 | Discharge: 2015-04-19 | Disposition: A | Discharge status: Home / Self Care | Payer: Medicare Other | Source: Ambulatory Visit | Attending: Radiation Oncology | Admitting: Radiation Oncology

## 2015-04-19 ENCOUNTER — Ambulatory Visit (HOSPITAL_BASED_OUTPATIENT_CLINIC_OR_DEPARTMENT_OTHER)
Admission: RE | Admit: 2015-04-19 | Discharge: 2015-04-19 | Disposition: A | Discharge status: Home / Self Care | Payer: Medicare Other | Source: Ambulatory Visit | Attending: Family | Admitting: Family

## 2015-04-19 ENCOUNTER — Ambulatory Visit (HOSPITAL_BASED_OUTPATIENT_CLINIC_OR_DEPARTMENT_OTHER): Payer: Medicare Other | Admitting: Hematology & Oncology

## 2015-04-19 ENCOUNTER — Ambulatory Visit (HOSPITAL_BASED_OUTPATIENT_CLINIC_OR_DEPARTMENT_OTHER): Payer: Medicare Other

## 2015-04-19 ENCOUNTER — Encounter (HOSPITAL_BASED_OUTPATIENT_CLINIC_OR_DEPARTMENT_OTHER): Payer: Self-pay

## 2015-04-19 ENCOUNTER — Inpatient Hospital Stay (HOSPITAL_BASED_OUTPATIENT_CLINIC_OR_DEPARTMENT_OTHER)
Admission: AD | Admit: 2015-04-19 | Discharge: 2015-04-22 | DRG: 435 | Disposition: A | Discharge status: Home / Self Care | Payer: Medicare Other | Source: Ambulatory Visit | Attending: Hematology & Oncology | Admitting: Hematology & Oncology

## 2015-04-19 VITALS — BP 166/95 | HR 120 | Temp 97.8°F | Resp 20 | Wt 131.5 lb

## 2015-04-19 DIAGNOSIS — F458 Other somatoform disorders: Secondary | ICD-10-CM | POA: Diagnosis not present

## 2015-04-19 DIAGNOSIS — E43 Unspecified severe protein-calorie malnutrition: Secondary | ICD-10-CM | POA: Diagnosis present

## 2015-04-19 DIAGNOSIS — C7800 Secondary malignant neoplasm of unspecified lung: Principal | ICD-10-CM

## 2015-04-19 DIAGNOSIS — C259 Malignant neoplasm of pancreas, unspecified: Secondary | ICD-10-CM | POA: Diagnosis present

## 2015-04-19 DIAGNOSIS — Z6826 Body mass index (BMI) 26.0-26.9, adult: Secondary | ICD-10-CM | POA: Diagnosis not present

## 2015-04-19 DIAGNOSIS — IMO0001 Reserved for inherently not codable concepts without codable children: Secondary | ICD-10-CM

## 2015-04-19 DIAGNOSIS — R599 Enlarged lymph nodes, unspecified: Secondary | ICD-10-CM

## 2015-04-19 DIAGNOSIS — Z5111 Encounter for antineoplastic chemotherapy: Secondary | ICD-10-CM | POA: Diagnosis not present

## 2015-04-19 DIAGNOSIS — C7951 Secondary malignant neoplasm of bone: Secondary | ICD-10-CM | POA: Diagnosis not present

## 2015-04-19 DIAGNOSIS — E119 Type 2 diabetes mellitus without complications: Secondary | ICD-10-CM | POA: Diagnosis present

## 2015-04-19 DIAGNOSIS — C250 Malignant neoplasm of head of pancreas: Secondary | ICD-10-CM

## 2015-04-19 DIAGNOSIS — R17 Unspecified jaundice: Secondary | ICD-10-CM

## 2015-04-19 DIAGNOSIS — D72829 Elevated white blood cell count, unspecified: Secondary | ICD-10-CM | POA: Diagnosis present

## 2015-04-19 DIAGNOSIS — Z87891 Personal history of nicotine dependence: Secondary | ICD-10-CM

## 2015-04-19 DIAGNOSIS — I10 Essential (primary) hypertension: Secondary | ICD-10-CM | POA: Diagnosis present

## 2015-04-19 DIAGNOSIS — Z823 Family history of stroke: Secondary | ICD-10-CM

## 2015-04-19 DIAGNOSIS — R7989 Other specified abnormal findings of blood chemistry: Secondary | ICD-10-CM

## 2015-04-19 DIAGNOSIS — Z8249 Family history of ischemic heart disease and other diseases of the circulatory system: Secondary | ICD-10-CM | POA: Diagnosis not present

## 2015-04-19 DIAGNOSIS — D630 Anemia in neoplastic disease: Secondary | ICD-10-CM | POA: Diagnosis present

## 2015-04-19 DIAGNOSIS — Z9221 Personal history of antineoplastic chemotherapy: Secondary | ICD-10-CM

## 2015-04-19 DIAGNOSIS — K831 Obstruction of bile duct: Secondary | ICD-10-CM

## 2015-04-19 DIAGNOSIS — R030 Elevated blood-pressure reading, without diagnosis of hypertension: Secondary | ICD-10-CM

## 2015-04-19 DIAGNOSIS — T85590A Other mechanical complication of bile duct prosthesis, initial encounter: Secondary | ICD-10-CM

## 2015-04-19 DIAGNOSIS — E46 Unspecified protein-calorie malnutrition: Secondary | ICD-10-CM

## 2015-04-19 DIAGNOSIS — Z510 Encounter for antineoplastic radiation therapy: Secondary | ICD-10-CM | POA: Diagnosis present

## 2015-04-19 LAB — CBC WITH DIFFERENTIAL (CANCER CENTER ONLY)
BASO#: 0.1 10*3/uL (ref 0.0–0.2)
BASO%: 0.4 % (ref 0.0–2.0)
EOS%: 1.9 % (ref 0.0–7.0)
Eosinophils Absolute: 0.3 10*3/uL (ref 0.0–0.5)
HCT: 32.9 % — ABNORMAL LOW (ref 34.8–46.6)
HGB: 11.7 g/dL (ref 11.6–15.9)
LYMPH#: 3.7 10*3/uL — ABNORMAL HIGH (ref 0.9–3.3)
LYMPH%: 20.8 % (ref 14.0–48.0)
MCH: 24.2 pg — ABNORMAL LOW (ref 26.0–34.0)
MCHC: 35.6 g/dL (ref 32.0–36.0)
MCV: 68 fL — AB (ref 81–101)
MONO#: 1.6 10*3/uL — ABNORMAL HIGH (ref 0.1–0.9)
MONO%: 8.7 % (ref 0.0–13.0)
NEUT#: 12.1 10*3/uL — ABNORMAL HIGH (ref 1.5–6.5)
NEUT%: 68.2 % (ref 39.6–80.0)
Platelets: 569 10*3/uL — ABNORMAL HIGH (ref 145–400)
RBC: 4.84 10*6/uL (ref 3.70–5.32)
RDW: 18.5 % — ABNORMAL HIGH (ref 11.1–15.7)
WBC: 17.8 10*3/uL — ABNORMAL HIGH (ref 3.9–10.0)

## 2015-04-19 LAB — CMP (CANCER CENTER ONLY)
ALBUMIN: 2.7 g/dL — AB (ref 3.3–5.5)
ALT(SGPT): 370 U/L (ref 10–47)
AST: 220 U/L — AB (ref 11–38)
Alkaline Phosphatase: 947 U/L — ABNORMAL HIGH (ref 26–84)
BUN: 9 mg/dL (ref 7–22)
CALCIUM: 10.2 mg/dL (ref 8.0–10.3)
CO2: 21 mEq/L (ref 18–33)
Chloride: 100 mEq/L (ref 98–108)
Creat: 0.9 mg/dl (ref 0.6–1.2)
Glucose, Bld: 157 mg/dL — ABNORMAL HIGH (ref 73–118)
Potassium: 4.1 mEq/L (ref 3.3–4.7)
Sodium: 131 mEq/L (ref 128–145)
Total Bilirubin: 8 mg/dl (ref 0.20–1.60)
Total Protein: 7 g/dL (ref 6.4–8.1)

## 2015-04-19 LAB — URINE MICROSCOPIC-ADD ON

## 2015-04-19 LAB — URINALYSIS, ROUTINE W REFLEX MICROSCOPIC
HGB URINE DIPSTICK: NEGATIVE
KETONES UR: NEGATIVE mg/dL
Nitrite: NEGATIVE
PROTEIN: NEGATIVE mg/dL
Specific Gravity, Urine: 1.035 — ABNORMAL HIGH (ref 1.005–1.030)
UROBILINOGEN UA: 0.2 mg/dL (ref 0.0–1.0)
pH: 6 (ref 5.0–8.0)

## 2015-04-19 MED ORDER — METFORMIN HCL 500 MG PO TABS: 500.0000 mg | ORAL_TABLET | Freq: Two times a day (BID) | ORAL | Status: DC

## 2015-04-19 MED ORDER — SODIUM CHLORIDE 0.9 % IV SOLN
INTRAVENOUS | Status: AC
  Administered 2015-04-19: 09:00:00 via INTRAVENOUS

## 2015-04-19 MED ORDER — GLIPIZIDE 5 MG PO TABS
5.0000 mg | ORAL_TABLET | Freq: Two times a day (BID) | ORAL | Status: AC
  Administered 2015-04-19 – 2015-04-21 (×4): 5 mg via ORAL
  Filled 2015-04-19 (×4): qty 1

## 2015-04-19 MED ORDER — ENOXAPARIN SODIUM 40 MG/0.4ML ~~LOC~~ SOLN
40.0000 mg | SUBCUTANEOUS | Status: DC
  Filled 2015-04-19: qty 0.4

## 2015-04-19 MED ORDER — VITAMIN B-6 50 MG PO TABS
250.0000 mg | ORAL_TABLET | Freq: Every day | ORAL | Status: DC
  Administered 2015-04-20 – 2015-04-22 (×3): 250 mg via ORAL
  Filled 2015-04-19 (×4): qty 1

## 2015-04-19 MED ORDER — GLIPIZIDE 5 MG PO TABS
5.0000 mg | ORAL_TABLET | Freq: Two times a day (BID) | ORAL | Status: DC
  Administered 2015-04-21 – 2015-04-22 (×2): 5 mg via ORAL
  Filled 2015-04-19 (×5): qty 1

## 2015-04-19 MED ORDER — PANTOPRAZOLE SODIUM 40 MG PO TBEC
40.0000 mg | DELAYED_RELEASE_TABLET | Freq: Every day | ORAL | Status: DC
  Administered 2015-04-20 – 2015-04-22 (×3): 40 mg via ORAL
  Filled 2015-04-19 (×3): qty 1

## 2015-04-19 MED ORDER — ADULT MULTIVITAMIN W/MINERALS CH
1.0000 | ORAL_TABLET | Freq: Every day | ORAL | Status: DC
  Administered 2015-04-20 – 2015-04-22 (×3): 1 via ORAL
  Filled 2015-04-19 (×4): qty 1

## 2015-04-19 MED ORDER — SODIUM CHLORIDE 0.9 % IV SOLN
INTRAVENOUS | Status: DC
  Administered 2015-04-19 – 2015-04-20 (×2): via INTRAVENOUS

## 2015-04-19 MED ORDER — GLIPIZIDE-METFORMIN HCL 5-500 MG PO TABS: 1.0000 | ORAL_TABLET | Freq: Two times a day (BID) | ORAL | Status: DC

## 2015-04-19 MED ORDER — POLYETHYLENE GLYCOL 3350 17 G PO PACK: 17.0000 g | PACK | Freq: Every day | ORAL | Status: DC | PRN

## 2015-04-19 MED ORDER — GLIPIZIDE 5 MG PO TABS: 5.0000 mg | ORAL_TABLET | Freq: Two times a day (BID) | ORAL | Status: DC

## 2015-04-19 MED ORDER — METFORMIN HCL 500 MG PO TABS
500.0000 mg | ORAL_TABLET | Freq: Two times a day (BID) | ORAL | Status: DC
  Administered 2015-04-21 – 2015-04-22 (×2): 500 mg via ORAL
  Filled 2015-04-19 (×4): qty 1

## 2015-04-19 MED ORDER — AMLODIPINE BESYLATE 10 MG PO TABS
10.0000 mg | ORAL_TABLET | Freq: Every day | ORAL | Status: DC
  Administered 2015-04-20 – 2015-04-22 (×3): 10 mg via ORAL
  Filled 2015-04-19 (×4): qty 1

## 2015-04-19 MED ORDER — HYDROMORPHONE HCL 1 MG/ML IJ SOLN
1.0000 mg | INTRAMUSCULAR | Status: DC | PRN
Start: 2015-04-19 — End: 2015-04-22
  Administered 2015-04-19 – 2015-04-20 (×2): 1 mg via INTRAVENOUS
  Filled 2015-04-19 (×2): qty 1

## 2015-04-19 MED ORDER — IOHEXOL 300 MG/ML  SOLN
100.0000 mL | Freq: Once | INTRAMUSCULAR | Status: AC | PRN
  Administered 2015-04-19: 100 mL via INTRAVENOUS

## 2015-04-19 NOTE — H&P (Signed)
[place]  Telephone:([phone]) [phone]    ADMISSION NOTE  Admitting MD: Burney Gauze, MD  Attending MD: Burney Gauze, MD   HPI: Jacqueline Hahn is an 69 y.o. female with a diagnosis of metastatic adenocarcinoma of the pancreas, admitted to [facility] from the [place] at [facility] due to symptomatic jaundice, in order to continue with her cycle of chemotherapy with gemcitabine and Abraxane. Since 6/8 she has been experiencing low grade fevers and headaches, abdominal pain. CT of the abdomen and pelvis on 6/17 showed  progressive intrahepatic and extrahepatic biliary ductal dilatation with abrupt tapering in the pancreatic head, no obvious pancreatic head mass. Pancreatic body mass is stable. Lesion previously seen in the uncinate process is not as well visualized Progressive peripancreatic, hepatoduodenal and mesenteric adenopathy was noted. Stable omental nodularity. Nodular lesions in the lung bases are grossly stable as well as her T10 metastasis. CA 19.9 is 3798.8 as of 03/01/15  Principle Diagnosis:  Metastatic adenocarcinoma of the pancreas  Current Therapy:  Status post 6 cycles of gemcitabine/Abraxane, last dose 03/22/15 Zometa 4 mg IV every month, last 10/20/14  PMH:  Past Medical History  Diagnosis Date  . Hypertension   . UTI (urinary tract infection)   . History of stomach ulcers   . Diabetes mellitus without complication   . Complication of anesthesia     hx of n/v  . PONV (postoperative nausea and vomiting)   . Pancreatic cancer metastasized to lung 07/10/2014    Surgeries:  Past Surgical History  Procedure Laterality Date  . Dental surgery    . Bunionectomy    . Ercp N/A 06/23/2014    Procedure: ENDOSCOPIC RETROGRADE CHOLANGIOPANCREATOGRAPHY (ERCP);  Surgeon: Ladene Artist, MD;  Location: [facility] ENDOSCOPY;  Service: Endoscopy;  Laterality: N/A;  . Eus N/A 06/29/2014    Procedure: UPPER ENDOSCOPIC ULTRASOUND (EUS) LINEAR;  Surgeon: Beryle Beams,  MD;  Location: [facility] ENDOSCOPY;  Service: Endoscopy;  Laterality: N/A;  . Fine needle aspiration N/A 06/29/2014    Procedure: FINE NEEDLE ASPIRATION (FNA) LINEAR;  Surgeon: Beryle Beams, MD;  Location: [facility] ENDOSCOPY;  Service: Endoscopy;  Laterality: N/A;    Allergies: No Known Allergies  Medications:   Prior to Admission:  No prescriptions prior to admission   Scheduled Meds: Continuous Infusions: PRN Meds:.  Review of Systems:  See HPI for significant positives, rest of ROS negative  Family History:  Family History  Problem Relation Age of Onset  . Heart attack Mother   . Stroke Mother   . Heart attack Father   . Hyperlipidemia Sister   . Hypertension Sister   . Hypertension Brother   . Alcohol abuse Brother     Social History:  reports that she quit smoking about 48 years ago. Her smoking use included Cigarettes. She started smoking about 52 years ago. She has a 4 pack-year smoking history. She has never used smokeless tobacco. She reports that she does not drink alcohol or use illicit drugs.  Physical Exam:   There were no vitals filed for this visit. There were no vitals filed for this visit.  GENERAL:alert, no distress and comfortable SKIN: skin color, texture, turgor are normal, no rashes or significant lesions EYES: normal, conjunctiva are pink and non-injected, sclera clear OROPHARYNX:no exudate, no erythema and lips, buccal mucosa, and tongue normal  NECK: supple, thyroid normal size, non-tender, without nodularity LYMPH:  no palpable lymphadenopathy in the cervical, axillary or inguinal LUNGS: clear to auscultation and percussion with  normal breathing effort HEART: regular rate & rhythm and no murmurs and no lower extremity edema. Right port normal ABDOMEN: soft, non-tender and normal bowel sounds Musculoskeletal:no cyanosis of digits and no clubbing  PSYCH: alert & oriented x 3 with fluent speech NEURO: no focal motor/sensory  deficits    LABS: CBC   Recent Labs Lab 04/19/15 0804  WBC 17.8*  HGB 11.7  HCT 32.9*  PLT 569*  MCV 68*  MCH 24.2*  MCHC 35.6  RDW 18.5*  LYMPHSABS 3.7*  EOSABS 0.3  BASOSABS 0.1     CMP    Recent Labs Lab 04/19/15 0804  NA 131  K 4.1  CL 100  CO2 21  GLUCOSE 157*  BUN 9  CREATININE 0.9  ICT   CALCIUM 10.2  AST 220*  ALT 370*  ALKPHOS 947*  BILITOT 8.00*        Component Value Date/Time   BILITOT 8.00* 04/19/2015 0804   BILITOT 0.6 09/01/2014 1148   BILIDIR 0.0 04/19/2014 0851    Urinalysis    Component Value Date/Time   COLORURINE AMBER* 04/10/2015 0932   APPEARANCEUR CLOUDY* 04/10/2015 0932   LABSPEC 1.030 04/10/2015 0932   PHURINE 6.0 04/10/2015 0932   GLUCOSEU NEGATIVE 04/10/2015 0932   HGBUR NEGATIVE 04/10/2015 0932   BILIRUBINUR LARGE* 04/10/2015 0932   KETONESUR 15* 04/10/2015 0932   PROTEINUR 100* 04/10/2015 0932   UROBILINOGEN 2.0* 04/10/2015 0932   NITRITE POSITIVE* 04/10/2015 0932   LEUKOCYTESUR MODERATE* 04/10/2015 0932     Imaging Studies: Ct Abdomen W Contrast  04/19/2015   COMPARISON:  03/07/2015.  FINDINGS: Lower chest: Image quality is degraded in the lung bases due to respiratory motion. Irregular nodular densities are again seen in the lower lobes, difficult to measure due to morphology and respiratory motion. Heart size normal. No pericardial or pleural effusion.  Hepatobiliary: Previously seen left hepatic lobe hemangioma is poorly visualized. Progressive intrahepatic and extrahepatic biliary ductal dilatation. Common bile duct measures up to 1.7 cm, with fairly abrupt cut off in the pancreatic head (series 2, image 23 and coronal image 45). A common bile duct stent is in place, terminating just beyond the ampulla, as before. A faint low-attenuation lesion in the inferior right hepatic lobe measures approximately 8 mm (series 2, image 34), possibly stable. Gallbladder is unremarkable.  Pancreas: Heterogeneous mass in the  pancreatic body measures 1.7 x 2.2 cm, stable. There is distal gland atrophy and mild ductal dilatation, measuring up to 4 mm, as before. A heterogeneous area previously measured in the uncinate process of the pancreas is not as readily evident on the current exam.  Spleen: Negative.  Adrenals/Urinary Tract: Adrenal glands and kidneys are unremarkable. Visualized portions of the ureters are decompressed.  Stomach/Bowel: Stomach and visualized portions of the small bowel and colon are grossly unremarkable.  Vascular/Lymphatic: Atherosclerotic calcification of the arterial vasculature without abdominal aortic aneurysm. Hepatic duodenal lymph node measures 2.2 x 4.0 cm (series 2, image 21), previously 1.5 x 3.0 cm. Retroperitoneal aortocaval lymph nodes measure up to 7 mm and appear slightly more prominent. Gastrohepatic ligament lymph nodes are not enlarged by CT size criteria. Small bowel mesenteric lymph node measures 11 mm (series 2, image 29), previously 9 mm. Peripancreatic lymph node measures 10 mm (series 2, image 20), previously 8 mm.  Other: No free fluid. Omental nodules measure up to 5 mm, likely stable. Tiny periumbilical hernia contains fat.  Musculoskeletal: A sclerotic lesion in the T10 vertebral body is unchanged. Prominent Schmorl's node  and mild compression involving the superior endplate of the L1 vertebral body appear unchanged.  IMPRESSION: 1. Progressive intrahepatic and extrahepatic biliary ductal dilatation with abrupt tapering in the pancreatic head. No obvious pancreatic head mass. 2. Pancreatic body mass is stable. Lesion previously seen in the uncinate process is not as well visualized. 3. Progressive peripancreatic, hepatoduodenal and mesenteric adenopathy. 4. Stable omental nodularity. 5. Image quality in the lung bases is degraded by respiratory motion. Nodular lesions in the lung bases are grossly stable and by history, are metastatic. 6. Stable T10 metastasis.   Electronically Signed    By: Lorin Picket M.D.   On: 04/19/2015 11:46     Assessment and Plan: 69 y.o. female with  Metastatic pancreatic cancer Patient is being admitted for continuation of her chemotherapy plans with Gemcitabine and Abraxane, unable to be administered as outpatient due to symptomatic jaundice Will proceed with therapy, monitor for symptoms, provide supportive therapies as needed. Radiation Oncology to see  Anemia in neoplastic disease This is stable No transfusion is indicated at this time Monitor counts closely Transfuse blood to maintain a Hb of 8 g or if the patient is acutely bleeding  Leukocytosis This is likely reactive, recent Decadron No intervention is indicated at this time Will continue to monitor  Malnutrition Will consider nutrition evaluation  DVT prophylaxis On Lovenox and SCDs   Full Code    [facility] E 04/19/2015 2:00 PM   ADDENDUM:  I agree with the above note. The patient is admitted from the office.  We will see what gastroenterology can do. Hopefully they can do a stent exchange.  We will recheck her LFTs in the morning.  I have spoken with radiation oncology. They will see her on Monday.  We will give her IV fluids. Normal saline will be appropriate. She'll have some IV the latter for pain control if needed.  I think the real key would be to try to open up her biliary system. Hopefully, gastroenterology can do this for [place].  I think that she is a candidate for radiation therapy, I would use oral Xeloda to try to desensitize for radiation therapy.  She is done quite well. His been close to a year that she's been diagnosed. She really has been doing well up until recently.  She still has a decent performance status so we could try a different line of chemotherapy if necessary.  I will have to discuss CODE STATUS with her. I think this is some that is going to be important.  I appreciate all the great care that she will get on the floor on 3  W.  Pete E

## 2015-04-19 NOTE — Progress Notes (Signed)
Patient came into office for chemo treatment today. Treatment held due to jaundice and other symptoms. IVF administered per orders.   Patient being discharged from outpatient clinic. Patient will be admitted to [facility]. [facility] left accessed for continuity of care to the inpatient setting.

## 2015-04-19 NOTE — Progress Notes (Signed)
Hematology and Oncology Follow Up Visit  Jacqueline Hahn [id number] 1946-03-08 69 y.o. 04/19/2015   Principle Diagnosis:  Metastatic adenocarcinoma of the pancreas  Current Therapy:   Status post 8 cycles of gemcitabine/Abraxane Zometa 4 mg IV every month    Interim History: Jacqueline Hahn is back for follow-up. Unfortunately, she is not feeling too well. She clearly is jaundiced now.  She was down in [place]. She was on a little vacation. She had a little bit of a tough time. She did not have a lot of energy. She had worsening fatigue.Marland Kitchen  Her appetite is down. She's lost some weight. She's had abdominal discomfort. She's had nausea. She's not had diarrhea.  She's had no fever. She's had no dysuria. She's had no leg swelling. She's had no cough and there's been no mouth sores. Her performance status is ECOG 1.  Medications:    Medication List       This list is accurate as of: 04/19/15  4:38 PM.  Always use your most recent med list.               amLODipine 10 MG tablet  Commonly known as:  NORVASC  Take 1 tablet (10 mg total) by mouth daily.     BC HEADACHE POWDER PO  Take 1 Package by mouth daily as needed (headache).     CALCIUM + D PO  Take 1 tablet by mouth daily.     cephALEXin 500 MG capsule  Commonly known as:  KEFLEX  Take 1 capsule (500 mg total) by mouth 4 (four) times daily.     FLAX PO  Take 1 tablet by mouth daily.     glipiZIDE-metformin 5-500 MG per tablet  Commonly known as:  METAGLIP  Take 1 tablet by mouth 2 (two) times daily before a meal.     lansoprazole 15 MG capsule  Commonly known as:  PREVACID  Take 15 mg by mouth daily as needed (for heartburn).     lidocaine-prilocaine cream  Commonly known as:  EMLA  Apply 1 application topically as needed. Apply quarter sized amount to portacath site at least one hour prior to chemo treatment     megestrol 400 MG/10ML suspension  Commonly known as:  MEGACE  Take 800 mg by mouth daily as needed (only  uses when stomach is upset or feels stomach "needs" it).     multivitamin with minerals Tabs tablet  Take 1 tablet by mouth daily.     sulfamethoxazole-trimethoprim 400-80 MG per tablet  Commonly known as:  BACTRIM  Take 1 tablet by mouth 2 (two) times daily. Take for 5 days.     pyridOXINE 50 MG tablet  Commonly known as:  B-6  Take 250 mg by mouth daily.     vitamin B-6 250 MG tablet  Take 1 tablet (250 mg total) by mouth daily.     vitamin E 1000 UNIT capsule  Take 1,000 Units by mouth daily.        Allergies: No Known Allergies  Past Medical History, Surgical history, Social history, and Family History were reviewed and updated.  Review of Systems: All other 10 point review of systems is negative.   Physical Exam:  weight is 131 lb 8 oz (59.648 kg). Her oral temperature is 97.8 F (36.6 C). Her blood pressure is 166/95 and her pulse is 120. Her respiration is 20.   Wt Readings from Last 3 Encounters:  04/19/15 131 lb 8 oz (59.648 kg)  04/10/15  138 lb (62.596 kg)  03/22/15 138 lb (62.596 kg)    Head and neck exam shows no ocular or oral lesions. She has no scleral icterus. Lungs are clear bilaterally. Cardiac exam regular rate and rhythm with no murmurs, rubs or bruits. Abdomen is soft. She has good bowel sounds. There is no fluid wave. There is no palpable liver or spleen tip. Back exam shows no tenderness over the spine, ribs or hips. Extremities shows no clubbing, cyanosis or edema. She has good range of motion of her joints. Skin exam shows no rashes, ecchymoses or petechia. Neurological exam shows no focal neurological deficits. Lab Results  Component Value Date   WBC 17.8* 04/19/2015   HGB 11.7 04/19/2015   HCT 32.9* 04/19/2015   MCV 68* 04/19/2015   PLT 569* 04/19/2015   Lab Results  Component Value Date   FERRITIN 205 06/25/2014   IRON 44 06/25/2014   TIBC 301 06/25/2014   UIBC 257 06/25/2014   IRONPCTSAT 15* 06/25/2014   Lab Results  Component  Value Date   RBC 4.84 04/19/2015   No results found for: KPAFRELGTCHN, LAMBDASER, KAPLAMBRATIO No results found for: IGGSERUM, IGA, IGMSERUM No results found for: Odetta Pink, SPEI   Chemistry      Component Value Date/Time   NA 131 04/19/2015 0804   NA 136 04/10/2015 0945   K 4.1 04/19/2015 0804   K 3.4* 04/10/2015 0945   CL 100 04/19/2015 0804   CL 102 04/10/2015 0945   CO2 21 04/19/2015 0804   CO2 21* 04/10/2015 0945   BUN 9 04/19/2015 0804   BUN 7 04/10/2015 0945   CREATININE 0.9  ICT  04/19/2015 0804   CREATININE 0.73 04/10/2015 0945      Component Value Date/Time   CALCIUM 10.2 04/19/2015 0804   CALCIUM 9.4 04/10/2015 0945   ALKPHOS 947* 04/19/2015 0804   ALKPHOS 121* 09/01/2014 1148   AST 220* 04/19/2015 0804   AST 48* 09/01/2014 1148   ALT 370* 04/19/2015 0804   ALT 85* 09/01/2014 1148   BILITOT 8.00* 04/19/2015 0804   BILITOT 0.6 09/01/2014 1148     Impression and Plan: Jacqueline Hahn is 69 year old female. She is metastatic adenocarcinoma of pancreas.   I think that it is apparent that her disease is progressing. The jaundice and elevated liver function tests are new. Per the CT scan shows that she has progressive perihepatic and peri-pancreatic adenopathy.  I think that she is going to have to be admitted. I think that we need to see about exchanging her biliary stent if possible.  Since he really does not have hepatic disease, I think that we might be elevated to consider radiation therapy with some oral Xeloda to try to help decrease the adenopathy and help maintain biliary patency.  I talked to her at length. I spent about 45 minutes with her. We did go ahead and use of IV fluids in the office.  I spoke with gastroenterology. I spoke with radiation oncology.  I think that if we can improve her liver function studies, then we might have to consider second line therapy. She still has a decent performance  status. I'm not sure if she could tolerate FOLFIRINOX but we might consider the new agent, Onivyde, that is combined with 5-FU.   I spent about 30 minutes with her today talking to about a change in her protocol. [name], MD 6/17/20164:38 PM

## 2015-04-19 NOTE — Progress Notes (Signed)
Radiation Oncology         [phone] ________________________________  Initial inpatient Consultation  Name: Jacqueline Hahn MRN: [id number]  Date: 04/19/2015  DOB: 05-24-1946  ST:[id number] Birdie Riddle, MD  [name], MD   REFERRING PHYSICIAN: [name], MD  DIAGNOSIS: There were no encounter diagnoses.  69 y.o. woman with metastatic pancreatic cancer and symptomatic peripancreatic adenopathy-Stage IV  HISTORY OF PRESENT ILLNESS::Jacqueline Hahn is a 69 y.o. female who was diagnosed with adenocarcinoma of the pancreas on 06/29/14. Her CA19-9 at that time was 13,474. At the time of diagnosis, the pt had lymphadenopathy in the mesentery as well as tiny pulmonary nodules consistent with Stage IV disease. She received 6 cycles of gemcitabine/abraxane and her CA19-9 level nadired at 485 on 12/05/14. More recently, her CA19-9 has been rising up to 3,798.8 on 03/01/15. The pt has been developing increasing jaundice with bilirubin of 8 today. CT scan of the abdomen also performed today demonstrates progressive peripancreatic, hepatoduodenal and mesenteric adenopathy. She has kindly been referred today for the consideration of palliative radiation therapy.  PREVIOUS RADIATION THERAPY: No  PAST MEDICAL HISTORY:  has a past medical history of Hypertension; UTI (urinary tract infection); History of stomach ulcers; Diabetes mellitus without complication; Complication of anesthesia; PONV (postoperative nausea and vomiting); and Pancreatic cancer metastasized to lung (07/10/2014).    PAST SURGICAL HISTORY: Past Surgical History  Procedure Laterality Date  . Dental surgery    . Bunionectomy    . Ercp N/A 06/23/2014    Procedure: ENDOSCOPIC RETROGRADE CHOLANGIOPANCREATOGRAPHY (ERCP);  Surgeon: Ladene Artist, MD;  Location: [facility] ENDOSCOPY;  Service: Endoscopy;  Laterality: N/A;  . Eus N/A 06/29/2014    Procedure: UPPER ENDOSCOPIC ULTRASOUND (EUS) LINEAR;  Surgeon: Beryle Beams, MD;  Location: [facility] ENDOSCOPY;   Service: Endoscopy;  Laterality: N/A;  . Fine needle aspiration N/A 06/29/2014    Procedure: FINE NEEDLE ASPIRATION (FNA) LINEAR;  Surgeon: Beryle Beams, MD;  Location: [facility] ENDOSCOPY;  Service: Endoscopy;  Laterality: N/A;    FAMILY HISTORY: family history includes Alcohol abuse in her brother; Heart attack in her father and mother; Hyperlipidemia in her sister; Hypertension in her brother and sister; Stroke in her mother.  SOCIAL HISTORY:  reports that she quit smoking about 48 years ago. Her smoking use included Cigarettes. She started smoking about 52 years ago. She has a 4 pack-year smoking history. She has never used smokeless tobacco. She reports that she does not drink alcohol or use illicit drugs.  ALLERGIES: Review of patient's allergies indicates no known allergies.  MEDICATIONS:  Current Outpatient Prescriptions  Medication Sig Dispense Refill  . amLODipine (NORVASC) 10 MG tablet Take 1 tablet (10 mg total) by mouth daily. 30 tablet 3  . Aspirin-Salicylamide-Caffeine (BC HEADACHE POWDER PO) Take 1 Package by mouth daily as needed (headache).    . Calcium Carbonate-Vitamin D (CALCIUM + D PO) Take 1 tablet by mouth daily.    . cephALEXin (KEFLEX) 500 MG capsule Take 1 capsule (500 mg total) by mouth 4 (four) times daily. (Patient not taking: Reported on 04/19/2015) 28 capsule 0  . Flaxseed, Linseed, (FLAX PO) Take 1 tablet by mouth daily.    Marland Kitchen glipiZIDE-metformin (METAGLIP) 5-500 MG per tablet Take 1 tablet by mouth 2 (two) times daily before a meal. 60 tablet 4  . lansoprazole (PREVACID) 15 MG capsule Take 15 mg by mouth daily as needed (for heartburn).     Marland Kitchen lidocaine-prilocaine (EMLA) cream Apply 1 application topically as needed. Apply  quarter sized amount to portacath site at least one hour prior to chemo treatment (Patient not taking: Reported on 04/19/2015) 30 g 0  . megestrol (MEGACE) 400 MG/10ML suspension Take 800 mg by mouth daily as needed (only uses when stomach is upset or  feels stomach "needs" it).     . Multiple Vitamin (MULTIVITAMIN WITH MINERALS) TABS tablet Take 1 tablet by mouth daily.    Marland Kitchen pyridOXINE (B-6) 50 MG tablet Take 250 mg by mouth daily.    . Pyridoxine HCl (VITAMIN B-6) 250 MG tablet Take 1 tablet (250 mg total) by mouth daily. (Patient not taking: Reported on 04/10/2015) 30 tablet 6  . sulfamethoxazole-trimethoprim (BACTRIM) 400-80 MG per tablet Take 1 tablet by mouth 2 (two) times daily. Take for 5 days. (Patient not taking: Reported on 04/19/2015) 10 tablet 0  . vitamin E 1000 UNIT capsule Take 1,000 Units by mouth daily.     No current facility-administered medications for this encounter.    REVIEW OF SYSTEMS:  A 15 point review of systems is documented in the electronic medical record. This was obtained by the nursing staff. However, I reviewed this with the patient to discuss relevant findings and make appropriate changes.  Pertinent items are noted in HPI.   PHYSICAL EXAM:  The patient does appear jaundiced. Per admission H&P    Head and neck exam shows no ocular or oral lesions. She has no scleral icterus. Lungs are clear bilaterally. Cardiac exam regular rate and rhythm with no murmurs, rubs or bruits. Abdomen is soft. She has good bowel sounds. There is no fluid wave. There is no palpable liver or spleen tip. Back exam shows no tenderness over the spine, ribs or hips. Extremities shows no clubbing, cyanosis or edema. She has good range of motion of her joints. Skin exam shows no rashes, ecchymoses or petechia. Neurological exam shows no focal neurological deficits.  KPS = 80  100 - Normal; no complaints; no evidence of disease. 90   - Able to carry on normal activity; minor signs or symptoms of disease. 80   - Normal activity with effort; some signs or symptoms of disease. 72   - Cares for self; unable to carry on normal activity or to do active work. 60   - Requires occasional assistance, but is able to care for most of his personal  needs. 50   - Requires considerable assistance and frequent medical care. 75   - Disabled; requires special care and assistance. 41   - Severely disabled; hospital admission is indicated although death not imminent. 38   - Very sick; hospital admission necessary; active supportive treatment necessary. 10   - Moribund; fatal processes progressing rapidly. 0     - Dead  Karnofsky DA, Abelmann [place], Craver LS and Burchenal [facility] [phone]) The use of the nitrogen mustards in the palliative treatment of carcinoma: with particular reference to bronchogenic carcinoma Cancer 1 [phone]  LABORATORY DATA:  Lab Results  Component Value Date   WBC 17.8* 04/19/2015   HGB 11.7 04/19/2015   HCT 32.9* 04/19/2015   MCV 68* 04/19/2015   PLT 569* 04/19/2015   Lab Results  Component Value Date   NA 131 04/19/2015   K 4.1 04/19/2015   CL 100 04/19/2015   CO2 21 04/19/2015   Lab Results  Component Value Date   ALT 370* 04/19/2015   AST 220* 04/19/2015   ALKPHOS 947* 04/19/2015   BILITOT 8.00* 04/19/2015     RADIOGRAPHY: Ct Abdomen  W Contrast  04/19/2015   CLINICAL DATA:  Pancreatic cancer with lung metastases.  EXAM: CT ABDOMEN WITH CONTRAST  TECHNIQUE: Multidetector CT imaging of the abdomen was performed using the standard protocol following bolus administration of intravenous contrast.  CONTRAST:  130mL OMNIPAQUE IOHEXOL 300 MG/ML  SOLN  COMPARISON:  03/07/2015.  FINDINGS: Lower chest: Image quality is degraded in the lung bases due to respiratory motion. Irregular nodular densities are again seen in the lower lobes, difficult to measure due to morphology and respiratory motion. Heart size normal. No pericardial or pleural effusion.  Hepatobiliary: Previously seen left hepatic lobe hemangioma is poorly visualized. Progressive intrahepatic and extrahepatic biliary ductal dilatation. Common bile duct measures up to 1.7 cm, with fairly abrupt cut off in the pancreatic head (series 2, image 23 and coronal image  45). A common bile duct stent is in place, terminating just beyond the ampulla, as before. A faint low-attenuation lesion in the inferior right hepatic lobe measures approximately 8 mm (series 2, image 34), possibly stable. Gallbladder is unremarkable.  Pancreas: Heterogeneous mass in the pancreatic body measures 1.7 x 2.2 cm, stable. There is distal gland atrophy and mild ductal dilatation, measuring up to 4 mm, as before. A heterogeneous area previously measured in the uncinate process of the pancreas is not as readily evident on the current exam.  Spleen: Negative.  Adrenals/Urinary Tract: Adrenal glands and kidneys are unremarkable. Visualized portions of the ureters are decompressed.  Stomach/Bowel: Stomach and visualized portions of the small bowel and colon are grossly unremarkable.  Vascular/Lymphatic: Atherosclerotic calcification of the arterial vasculature without abdominal aortic aneurysm. Hepatic duodenal lymph node measures 2.2 x 4.0 cm (series 2, image 21), previously 1.5 x 3.0 cm. Retroperitoneal aortocaval lymph nodes measure up to 7 mm and appear slightly more prominent. Gastrohepatic ligament lymph nodes are not enlarged by CT size criteria. Small bowel mesenteric lymph node measures 11 mm (series 2, image 29), previously 9 mm. Peripancreatic lymph node measures 10 mm (series 2, image 20), previously 8 mm.  Other: No free fluid. Omental nodules measure up to 5 mm, likely stable. Tiny periumbilical hernia contains fat.  Musculoskeletal: A sclerotic lesion in the T10 vertebral body is unchanged. Prominent Schmorl's node and mild compression involving the superior endplate of the L1 vertebral body appear unchanged.  IMPRESSION: 1. Progressive intrahepatic and extrahepatic biliary ductal dilatation with abrupt tapering in the pancreatic head. No obvious pancreatic head mass. 2. Pancreatic body mass is stable. Lesion previously seen in the uncinate process is not as well visualized. 3. Progressive  peripancreatic, hepatoduodenal and mesenteric adenopathy. 4. Stable omental nodularity. 5. Image quality in the lung bases is degraded by respiratory motion. Nodular lesions in the lung bases are grossly stable and by history, are metastatic. 6. Stable T10 metastasis.   Electronically Signed   By: Lorin Picket M.D.   On: 04/19/2015 11:46      IMPRESSION: 69 y.o. woman with metastatic pancreatic cancer and symptomatic peripancreatic adenopathy-Stage IV. The pt may benefit from localized palliative radiotherapy.   PLAN: Today, I talked to the patient and family about the findings and work-up thus far.  We discussed the natural history of symptomatic adenopathy from metastatic pancreatic cancer and general treatment, highlighting the role of radiotherapy in the management.  We discussed the available radiation techniques, and focused on the details of logistics and delivery.  We reviewed the anticipated acute and late sequelae associated with radiation in this setting.  The patient was encouraged to ask questions  that I answered to the best of my ability.  I filled out a patient counseling form during our discussion including treatment diagrams.  We retained a copy for our records.  The patient would like to proceed with radiation and will be scheduled for CT simulation.   This document serves as a record of services personally performed by Tyler Pita, MD. It was created on his behalf by Darcus Austin, a trained medical scribe. The creation of this record is based on the scribe's personal observations and the provider's statements to them. This document has been checked and approved by the attending provider.     ------------------------------------------------  Sheral Apley Tammi Klippel, M.D.

## 2015-04-19 NOTE — Progress Notes (Signed)
PHARMACIST - PHYSICIAN COMMUNICATION DR:  Marin Olp CONCERNING:  METFORMIN SAFE ADMINISTRATION POLICY  RECOMMENDATION: Current safety recommendations include avoiding metformin for a minimum of 48 hours after the patient's exposure to intravenous contrast media.  DESCRIPTION:  The Pharmacy Committee has adopted a policy that restricts the use of metformin in hospitalized patients until all the contraindications to administration have been ruled out. Specific contraindications are: []  Serum creatinine ? 1.5 for males []  Serum creatinine ? 1.4 for females []  Shock, acute MI, sepsis, hypoxemia, dehydration [x]  Administration of intravenous iodinated contrast media []  Heart Failure patients with low EF []  Acute or chronic metabolic acidosis (including DKA)     Metformin is due to resume 04/21/15. Glipizide will be continue as scheduled.  Royetta Asal, PharmD, BCPS 04/19/15, 17:38

## 2015-04-19 NOTE — Progress Notes (Signed)
Dr. Watt Climes called me about this patient.  I reviewed her records, images.  Will make her NPO after MN tonight and stop blood thinners for likely ERCP with stent exchange tomorrow.  Will see her for full consult in the morning, please call or page with any questions before then if needed.

## 2015-04-20 DIAGNOSIS — C259 Malignant neoplasm of pancreas, unspecified: Principal | ICD-10-CM

## 2015-04-20 DIAGNOSIS — K831 Obstruction of bile duct: Secondary | ICD-10-CM

## 2015-04-20 LAB — CBC WITH DIFFERENTIAL/PLATELET
BASOS PCT: 1 % (ref 0–1)
Basophils Absolute: 0.1 10*3/uL (ref 0.0–0.1)
EOS PCT: 2 % (ref 0–5)
Eosinophils Absolute: 0.3 10*3/uL (ref 0.0–0.7)
HCT: 31.3 % — ABNORMAL LOW (ref 36.0–46.0)
Hemoglobin: 10.3 g/dL — ABNORMAL LOW (ref 12.0–15.0)
LYMPHS ABS: 3.3 10*3/uL (ref 0.7–4.0)
Lymphocytes Relative: 18 % (ref 12–46)
MCH: 23.7 pg — ABNORMAL LOW (ref 26.0–34.0)
MCHC: 32.9 g/dL (ref 30.0–36.0)
MCV: 72.1 fL — ABNORMAL LOW (ref 78.0–100.0)
MONO ABS: 1 10*3/uL (ref 0.1–1.0)
Monocytes Relative: 6 % (ref 3–12)
NEUTROS ABS: 13 10*3/uL — AB (ref 1.7–7.7)
Neutrophils Relative %: 73 % (ref 43–77)
Platelets: 638 10*3/uL — ABNORMAL HIGH (ref 150–400)
RBC: 4.34 MIL/uL (ref 3.87–5.11)
RDW: 18 % — AB (ref 11.5–15.5)
WBC: 17.7 10*3/uL — ABNORMAL HIGH (ref 4.0–10.5)

## 2015-04-20 LAB — COMPREHENSIVE METABOLIC PANEL
ALBUMIN: 2.6 g/dL — AB (ref 3.5–5.0)
ALK PHOS: 850 U/L — AB (ref 38–126)
ALT: 314 U/L — ABNORMAL HIGH (ref 14–54)
AST: 192 U/L — ABNORMAL HIGH (ref 15–41)
Anion gap: 10 (ref 5–15)
BUN: 7 mg/dL (ref 6–20)
CALCIUM: 8.8 mg/dL — AB (ref 8.9–10.3)
CO2: 19 mmol/L — ABNORMAL LOW (ref 22–32)
Chloride: 107 mmol/L (ref 101–111)
Creatinine, Ser: 0.36 mg/dL — ABNORMAL LOW (ref 0.44–1.00)
GFR calc Af Amer: >60 mL/min (ref >60)
GFR calc non Af Amer: >60 mL/min (ref >60)
GLUCOSE: 117 mg/dL — AB (ref 65–99)
POTASSIUM: 3.3 mmol/L — AB (ref 3.5–5.1)
SODIUM: 136 mmol/L (ref 135–145)
Total Bilirubin: 8.9 mg/dL — ABNORMAL HIGH (ref 0.3–1.2)
Total Protein: 7 g/dL (ref 6.5–8.1)

## 2015-04-20 LAB — PREALBUMIN
Prealbumin: 14 mg/dL — ABNORMAL LOW (ref 17–34)
Prealbumin: 13.3 mg/dL — ABNORMAL LOW (ref 18–38)

## 2015-04-20 LAB — CANCER ANTIGEN 19-9: CA 19-9: 4453.3 U/mL — ABNORMAL HIGH (ref <35.0)

## 2015-04-20 LAB — PROTIME-INR
INR: 1.16 (ref 0.00–1.49)
Prothrombin Time: 14.9 seconds (ref 11.6–15.2)

## 2015-04-20 MED ORDER — DOCUSATE SODIUM 100 MG PO CAPS
100.0000 mg | ORAL_CAPSULE | Freq: Two times a day (BID) | ORAL | Status: DC
  Administered 2015-04-20 – 2015-04-22 (×4): 100 mg via ORAL
  Filled 2015-04-20 (×6): qty 1

## 2015-04-20 MED ORDER — ZOLPIDEM TARTRATE 5 MG PO TABS: 5.0000 mg | ORAL_TABLET | Freq: Every evening | ORAL | Status: DC | PRN

## 2015-04-20 MED ORDER — CIPROFLOXACIN HCL 500 MG PO TABS
500.0000 mg | ORAL_TABLET | Freq: Two times a day (BID) | ORAL | Status: DC
  Administered 2015-04-20 – 2015-04-22 (×5): 500 mg via ORAL
  Filled 2015-04-20 (×8): qty 1

## 2015-04-20 MED ORDER — PROCHLORPERAZINE EDISYLATE 5 MG/ML IJ SOLN
10.0000 mg | Freq: Four times a day (QID) | INTRAMUSCULAR | Status: DC | PRN
  Administered 2015-04-20: 10 mg via INTRAVENOUS
  Filled 2015-04-20: qty 2

## 2015-04-20 NOTE — Consult Note (Signed)
[facility] (covering for Dr. Adriana Mccallum this weekend)  Referring Provider: Perry, Holli Humbles, NP Primary Care Physician:  Annye Asa, MD Primary Gastroenterologist:  Dr. Carol Ada  Reason for Consultation: Elevated liver tests   HPI:  Jacqueline Hahn is a 69 y.o. female with known metastatic pancreatic adenocarcinoma, diagnosed Stage IV 06/2013; underwent ERCP around time of diagnosis, Dr. Fuller Plan, placement of 10Fr 7cm plastic biliary stent.  Recently she's been feeling poorly, about a week ago she had darker than usual urine, was felt to possibly hve UTI and started on abx (including bactrim).  + fevers around that time.  LFTs were not checked.  She only took 1-2 days of the abx, tells me they made her feel sick, pruritis started around the same time.  In follow up oncology visity labs yesterday showed significant increase in her liver tests (t bili up to 8) and CT scan confirmed increased dilation of intra/extrahepatic ducts. I personally reviewed the images and report (see below).   Past Medical History  Diagnosis Date  . Hypertension   . UTI (urinary tract infection)   . History of stomach ulcers   . Diabetes mellitus without complication   . Complication of anesthesia     hx of n/v  . PONV (postoperative nausea and vomiting)   . Pancreatic cancer metastasized to lung 07/10/2014    Past Surgical History  Procedure Laterality Date  . Dental surgery    . Bunionectomy    . Ercp N/A 06/23/2014    Procedure: ENDOSCOPIC RETROGRADE CHOLANGIOPANCREATOGRAPHY (ERCP);  Surgeon: Ladene Artist, MD;  Location: [facility] ENDOSCOPY;  Service: Endoscopy;  Laterality: N/A;  . Eus N/A 06/29/2014    Procedure: UPPER ENDOSCOPIC ULTRASOUND (EUS) LINEAR;  Surgeon: Beryle Beams, MD;  Location: [facility] ENDOSCOPY;  Service: Endoscopy;  Laterality: N/A;  . Fine needle aspiration N/A 06/29/2014    Procedure: FINE NEEDLE ASPIRATION (FNA) LINEAR;  Surgeon: Beryle Beams, MD;  Location: [facility] ENDOSCOPY;   Service: Endoscopy;  Laterality: N/A;    Prior to Admission medications   Medication Sig Start Date End Date Taking? Authorizing Provider  amLODipine (NORVASC) 10 MG tablet Take 1 tablet (10 mg total) by mouth daily. 10/10/14  Yes [name], MD  Calcium Carbonate-Vitamin D (CALCIUM + D PO) Take 1 tablet by mouth daily.   Yes Historical Provider, MD  Flaxseed, Linseed, (FLAX PO) Take 1 tablet by mouth daily.   Yes Historical Provider, MD  glipiZIDE-metformin (METAGLIP) 5-500 MG per tablet Take 1 tablet by mouth 2 (two) times daily before a meal. 02/22/15  Yes [name], MD  lansoprazole (PREVACID) 15 MG capsule Take 15 mg by mouth daily as needed (for heartburn).    Yes Historical Provider, MD  lidocaine-prilocaine (EMLA) cream Apply 1 application topically as needed. Apply quarter sized amount to portacath site at least one hour prior to chemo treatment 07/10/14  Yes [name], MD  megestrol (MEGACE) 400 MG/10ML suspension Take 800 mg by mouth daily as needed (only uses when stomach is upset or feels stomach "needs" it).    Yes Historical Provider, MD  Multiple Vitamin (MULTIVITAMIN WITH MINERALS) TABS tablet Take 1 tablet by mouth daily.   Yes Historical Provider, MD  Pyridoxine HCl (VITAMIN B-6) 250 MG tablet Take 1 tablet (250 mg total) by mouth daily. Patient taking differently: Take 1,250 mg by mouth daily. Taking 1250 mg (5 tablets) daily. 02/22/15  Yes [name], MD  vitamin E 1000 UNIT capsule Take 1,000  Units by mouth daily.   Yes Historical Provider, MD  cephALEXin (KEFLEX) 500 MG capsule Take 1 capsule (500 mg total) by mouth 4 (four) times daily. Patient not taking: Reported on 04/19/2015 04/10/15   Daleen Bo, MD  sulfamethoxazole-trimethoprim (BACTRIM) 400-80 MG per tablet Take 1 tablet by mouth 2 (two) times daily. Take for 5 days. Patient not taking: Reported on 04/19/2015 04/15/15   [name], MD    Current Facility-Administered Medications  Medication  Dose Route Frequency Provider Last Rate Last Dose  . 0.9 %  sodium chloride infusion   Intravenous Continuous [name], MD 75 mL/hr at 04/19/15 1713    . amLODipine (NORVASC) tablet 10 mg  10 mg Oral Daily [name], MD   10 mg at 04/19/15 1732  . [START ON 04/21/2015] glipiZIDE (GLUCOTROL) tablet 5 mg  5 mg Oral BID AC [name], MD       And  . Derrill Memo ON 04/21/2015] metFORMIN (GLUCOPHAGE) tablet 500 mg  500 mg Oral BID AC [name], MD      . glipiZIDE (GLUCOTROL) tablet 5 mg  5 mg Oral BID AC [name], MD   5 mg at 04/19/15 1842  . HYDROmorphone (DILAUDID) injection 1 mg  1 mg Intravenous Q4H PRN [name], MD   1 mg at 04/19/15 1802  . multivitamin with minerals tablet 1 tablet  1 tablet Oral Daily [name], MD   1 tablet at 04/19/15 1834  . pantoprazole (PROTONIX) EC tablet 40 mg  40 mg Oral Daily [name], MD   40 mg at 04/19/15 1834  . polyethylene glycol (MIRALAX / GLYCOLAX) packet 17 g  17 g Oral Daily PRN [name], MD      . pyridOXINE (VITAMIN B-6) tablet 250 mg  250 mg Oral Daily [name], MD   250 mg at 04/19/15 1835    Allergies as of 04/19/2015  . (No Known Allergies)    Family History  Problem Relation Age of Onset  . Heart attack Mother   . Stroke Mother   . Heart attack Father   . Hyperlipidemia Sister   . Hypertension Sister   . Hypertension Brother   . Alcohol abuse Brother     History   Social History  . Marital Status: Single    Spouse Name: N/A  . Number of Children: N/A  . Years of Education: N/A   Occupational History  . Not on file.   Social History Main Topics  . Smoking status: Former Smoker -- 1.00 packs/day for 4 years    Types: Cigarettes    Start date: 02/03/1963    Quit date: 03/05/1967  . Smokeless tobacco: Never Used     Comment: quit 45 years ago  . Alcohol Use: No  . Drug Use: No  . Sexual Activity: Not on file   Other Topics Concern  . Not on file   Social  History Narrative     Review of Systems: Pertinent positive and negative review of systems were noted in the above HPI section. Complete review of systems was performed and was otherwise normal.   Physical Exam: Vital signs in last 24 hours: Temp:  [97.8 F (36.6 C)-99.1 F (37.3 C)] 99.1 F (37.3 C) (06/18 0532) Pulse Rate:  [96-120] 96 (06/18 0532) Resp:  [16-20] 20 (06/18 0532) BP: (131-171)/(65-95) 171/73 mmHg (06/18 0532) SpO2:  [100 %] 100 % (06/18 0532) Weight:  [  129 lb 14.4 oz (58.922 kg)-136 lb 4.8 oz (61.825 kg)] 136 lb 4.8 oz (61.825 kg) (06/18 0532)   Constitutional: generally well-appearing Psychiatric: alert and oriented x3 Eyes: extraocular movements intact Mouth: oral pharynx moist, no lesions Neck: supple no lymphadenopathy Cardiovascular: heart regular rate and rhythm Lungs: clear to auscultation bilaterally Abdomen: soft, nontender, nondistended, no obvious ascites, no peritoneal signs, normal bowel sounds Extremities: no lower extremity edema bilaterally Skin: no lesions on visible extremities  Lab Results:  Recent Labs  04/19/15 0804  WBC 17.8*  HGB 11.7  HCT 32.9*  PLT 569*  MCV 68*   BMET  Recent Labs  04/19/15 0804  NA 131  K 4.1  CL 100  CO2 21  GLUCOSE 157*  BUN 9  CREATININE 0.9  ICT   CALCIUM 10.2   LFT  Recent Labs  04/19/15 0804  BILITOT 8.00*  AST 220*  ALT 370*  ALKPHOS 947*  PROT 7.0    Imaging/Other Results: Ct Abdomen W Contrast  04/19/2015   CLINICAL DATA:  Pancreatic cancer with lung metastases.  EXAM: CT ABDOMEN WITH CONTRAST  TECHNIQUE: Multidetector CT imaging of the abdomen was performed using the standard protocol following bolus administration of intravenous contrast.  CONTRAST:  165mL OMNIPAQUE IOHEXOL 300 MG/ML  SOLN  COMPARISON:  03/07/2015.  FINDINGS: Lower chest: Image quality is degraded in the lung bases due to respiratory motion. Irregular nodular densities are again seen in the lower lobes,  difficult to measure due to morphology and respiratory motion. Heart size normal. No pericardial or pleural effusion.  Hepatobiliary: Previously seen left hepatic lobe hemangioma is poorly visualized. Progressive intrahepatic and extrahepatic biliary ductal dilatation. Common bile duct measures up to 1.7 cm, with fairly abrupt cut off in the pancreatic head (series 2, image 23 and coronal image 45). A common bile duct stent is in place, terminating just beyond the ampulla, as before. A faint low-attenuation lesion in the inferior right hepatic lobe measures approximately 8 mm (series 2, image 34), possibly stable. Gallbladder is unremarkable.  Pancreas: Heterogeneous mass in the pancreatic body measures 1.7 x 2.2 cm, stable. There is distal gland atrophy and mild ductal dilatation, measuring up to 4 mm, as before. A heterogeneous area previously measured in the uncinate process of the pancreas is not as readily evident on the current exam.  Spleen: Negative.  Adrenals/Urinary Tract: Adrenal glands and kidneys are unremarkable. Visualized portions of the ureters are decompressed.  Stomach/Bowel: Stomach and visualized portions of the small bowel and colon are grossly unremarkable.  Vascular/Lymphatic: Atherosclerotic calcification of the arterial vasculature without abdominal aortic aneurysm. Hepatic duodenal lymph node measures 2.2 x 4.0 cm (series 2, image 21), previously 1.5 x 3.0 cm. Retroperitoneal aortocaval lymph nodes measure up to 7 mm and appear slightly more prominent. Gastrohepatic ligament lymph nodes are not enlarged by CT size criteria. Small bowel mesenteric lymph node measures 11 mm (series 2, image 29), previously 9 mm. Peripancreatic lymph node measures 10 mm (series 2, image 20), previously 8 mm.  Other: No free fluid. Omental nodules measure up to 5 mm, likely stable. Tiny periumbilical hernia contains fat.  Musculoskeletal: A sclerotic lesion in the T10 vertebral body is unchanged. Prominent  Schmorl's node and mild compression involving the superior endplate of the L1 vertebral body appear unchanged.  IMPRESSION: 1. Progressive intrahepatic and extrahepatic biliary ductal dilatation with abrupt tapering in the pancreatic head. No obvious pancreatic head mass. 2. Pancreatic body mass is stable. Lesion previously seen in the uncinate  process is not as well visualized. 3. Progressive peripancreatic, hepatoduodenal and mesenteric adenopathy. 4. Stable omental nodularity. 5. Image quality in the lung bases is degraded by respiratory motion. Nodular lesions in the lung bases are grossly stable and by history, are metastatic. 6. Stable T10 metastasis.   Electronically Signed   By: Lorin Picket M.D.   On: 04/19/2015 11:46    Impression/Plan: 69 y.o. female with rising liver tests, progressive dilation of biliary tract  Interesting that she took bactrim about a week ago, this can cause cholestasis but I think most likely her jaundice, elevated liver tests are from biliary process. The previous plastic stent is likely occluded. Will plan to removed it and replace with more permanent one today. Will giv oral cipro this AM.  IF stent exchange goes well, she will be safe for d/c later today from GI perspective.    Milus Banister, MD  04/20/2015, 5:34 AM [facility] Pager [phone]

## 2015-04-20 NOTE — Progress Notes (Addendum)
ERCP is scheduled for tomorrow AM 7:30.  She is aware. Will let her eat today.  Please do not start blood thinners today.

## 2015-04-20 NOTE — Progress Notes (Signed)
Jacqueline Hahn   DOB:October 05, 1946   NW#:[id number]   MVH#:[id number]  Subjective: hungry, hoping to eat now that procedure postponed 24h. No pain. Mild nausea. Small BM this AM, not hard. Mild itching. No family in room   Objective: middle aged AA woman examined in bed Filed Vitals:   04/20/15 0532  BP: 171/73  Pulse: 96  Temp: 99.1 F (37.3 C)  Resp: 20    Body mass index is 26.62 kg/(m^2).  Intake/Output Summary (Last 24 hours) at 04/20/15 0805 Last data filed at 04/20/15 8413  Gross per 24 hour  Intake      0 ml  Output   1350 ml  Net  -1350 ml     Sclerae icteric, EOMs intact  Oropharynx no thrush  Lungs clear -- no rales or rhonchi  Heart regular rate and rhythm  Abdomen soft, +BS, not distended, no masses palpated  Neuro nonfocal  Breast exam: deferred  CBG (last 3)  No results for input(s): GLUCAP in the last 72 hours.   Labs:  Lab Results  Component Value Date   WBC 17.8* 04/19/2015   HGB 11.7 04/19/2015   HCT 32.9* 04/19/2015   MCV 68* 04/19/2015   PLT 569* 04/19/2015   NEUTROABS 12.1* 04/19/2015    @LASTCHEMISTRY @  Urine Studies No results for input(s): UHGB, CRYS in the last 72 hours.  Invalid input(s): UACOL, UAPR, USPG, UPH, UTP, UGL, UKET, UBIL, UNIT, UROB, ULEU, UEPI, UWBC, URBC, UBAC, CAST, UCOM, BILUA  Basic Metabolic Panel:  Recent Labs Lab 04/19/15 0804  NA 131  K 4.1  CL 100  CO2 21  GLUCOSE 157*  BUN 9  CREATININE 0.9  ICT   CALCIUM 10.2   GFR CrCl cannot be calculated (Patient has no serum creatinine result on file.). Liver Function Tests:  Recent Labs Lab 04/19/15 0804  AST 220*  ALT 370*  ALKPHOS 947*  BILITOT 8.00*  PROT 7.0   No results for input(s): LIPASE, AMYLASE in the last 168 hours. No results for input(s): AMMONIA in the last 168 hours. Coagulation profile  Recent Labs Lab 04/20/15 0706  INR 1.16    CBC:  Recent Labs Lab 04/19/15 0804  WBC 17.8*  NEUTROABS 12.1*  HGB 11.7  HCT 32.9*  MCV 68*   PLT 569*   Cardiac Enzymes: No results for input(s): CKTOTAL, CKMB, CKMBINDEX, TROPONINI in the last 168 hours. BNP: Invalid input(s): POCBNP CBG: No results for input(s): GLUCAP in the last 168 hours. D-Dimer No results for input(s): DDIMER in the last 72 hours. Hgb A1c No results for input(s): HGBA1C in the last 72 hours. Lipid Profile No results for input(s): CHOL, HDL, LDLCALC, TRIG, CHOLHDL, LDLDIRECT in the last 72 hours. Thyroid function studies No results for input(s): TSH, T4TOTAL, T3FREE, THYROIDAB in the last 72 hours.  Invalid input(s): FREET3 Anemia work up No results for input(s): VITAMINB12, FOLATE, FERRITIN, TIBC, IRON, RETICCTPCT in the last 72 hours. Microbiology Recent Results (from the past 240 hour(s))  Urine culture     Status: None   Collection Time: 04/10/15  9:32 AM  Result Value Ref Range Status   Specimen Description URINE, CLEAN CATCH  Final   Special Requests Normal  Final   Colony Count   Final    9,000 COLONIES/ML Performed at [facility]    Culture   Final    INSIGNIFICANT GROWTH Performed at [facility]    Report Status 04/11/2015 FINAL  Final      Studies:  Ct Abdomen W Contrast  04/19/2015   CLINICAL DATA:  Pancreatic cancer with lung metastases.  EXAM: CT ABDOMEN WITH CONTRAST  TECHNIQUE: Multidetector CT imaging of the abdomen was performed using the standard protocol following bolus administration of intravenous contrast.  CONTRAST:  154mL OMNIPAQUE IOHEXOL 300 MG/ML  SOLN  COMPARISON:  03/07/2015.  FINDINGS: Lower chest: Image quality is degraded in the lung bases due to respiratory motion. Irregular nodular densities are again seen in the lower lobes, difficult to measure due to morphology and respiratory motion. Heart size normal. No pericardial or pleural effusion.  Hepatobiliary: Previously seen left hepatic lobe hemangioma is poorly visualized. Progressive intrahepatic and extrahepatic biliary ductal dilatation.  Common bile duct measures up to 1.7 cm, with fairly abrupt cut off in the pancreatic head (series 2, image 23 and coronal image 45). A common bile duct stent is in place, terminating just beyond the ampulla, as before. A faint low-attenuation lesion in the inferior right hepatic lobe measures approximately 8 mm (series 2, image 34), possibly stable. Gallbladder is unremarkable.  Pancreas: Heterogeneous mass in the pancreatic body measures 1.7 x 2.2 cm, stable. There is distal gland atrophy and mild ductal dilatation, measuring up to 4 mm, as before. A heterogeneous area previously measured in the uncinate process of the pancreas is not as readily evident on the current exam.  Spleen: Negative.  Adrenals/Urinary Tract: Adrenal glands and kidneys are unremarkable. Visualized portions of the ureters are decompressed.  Stomach/Bowel: Stomach and visualized portions of the small bowel and colon are grossly unremarkable.  Vascular/Lymphatic: Atherosclerotic calcification of the arterial vasculature without abdominal aortic aneurysm. Hepatic duodenal lymph node measures 2.2 x 4.0 cm (series 2, image 21), previously 1.5 x 3.0 cm. Retroperitoneal aortocaval lymph nodes measure up to 7 mm and appear slightly more prominent. Gastrohepatic ligament lymph nodes are not enlarged by CT size criteria. Small bowel mesenteric lymph node measures 11 mm (series 2, image 29), previously 9 mm. Peripancreatic lymph node measures 10 mm (series 2, image 20), previously 8 mm.  Other: No free fluid. Omental nodules measure up to 5 mm, likely stable. Tiny periumbilical hernia contains fat.  Musculoskeletal: A sclerotic lesion in the T10 vertebral body is unchanged. Prominent Schmorl's node and mild compression involving the superior endplate of the L1 vertebral body appear unchanged.  IMPRESSION: 1. Progressive intrahepatic and extrahepatic biliary ductal dilatation with abrupt tapering in the pancreatic head. No obvious pancreatic head mass.  2. Pancreatic body mass is stable. Lesion previously seen in the uncinate process is not as well visualized. 3. Progressive peripancreatic, hepatoduodenal and mesenteric adenopathy. 4. Stable omental nodularity. 5. Image quality in the lung bases is degraded by respiratory motion. Nodular lesions in the lung bases are grossly stable and by history, are metastatic. 6. Stable T10 metastasis.   Electronically Signed   By: Lorin Picket M.D.   On: 04/19/2015 11:46    Assessment: 68 y.o. [place] woman admitted with biliary obstruction from stage IV pancreatic cancer  (1) initial diagnosis 06/29/2014 vis FNA BX of pancreatic head  (2) on gemcitabine/ abraxane starting 07/18/2014, most recent dose 03/22/2015, with reassessment by labs and CT then showing stable/minimally progressive disease but a rising CA19  (a) additional chemotherapy incorporating platinum compounds was  planned as outpatient  (3) CT 04/19/2015 shows worsening adenopathy and intra/extrahepatic biliary ductal dilatation, with t bil currently 8.0 (was <1.0 May 2016)-- ERCP planned for 04/21/2015  (4) advanced directives-- patient is working on the papers, to be notarized when  ready   Plan:  Ms Verma is stable this AM, waiting on procedure tomorrow. I have encouraged her to ambulate in halls. [place] moderately well controlled. Will add supportive meds. Likely discharge next 48 hrs if all goes well   Chauncey Cruel, MD 04/20/2015  8:05 AM Medical Oncology and Hematology [facility] [place], [place] [address] Tel. [phone]    Fax. [phone]

## 2015-04-21 ENCOUNTER — Inpatient Hospital Stay (HOSPITAL_COMMUNITY): Payer: Medicare Other | Admitting: Anesthesiology

## 2015-04-21 ENCOUNTER — Encounter (HOSPITAL_COMMUNITY): Payer: Self-pay | Admitting: Anesthesiology

## 2015-04-21 ENCOUNTER — Encounter (HOSPITAL_COMMUNITY)
Admission: AD | Disposition: A | Discharge status: Home / Self Care | Payer: Self-pay | Source: Ambulatory Visit | Attending: Hematology & Oncology

## 2015-04-21 ENCOUNTER — Inpatient Hospital Stay (HOSPITAL_COMMUNITY): Payer: Medicare Other

## 2015-04-21 HISTORY — PX: ERCP: SHX5425

## 2015-04-21 LAB — CBC WITH DIFFERENTIAL/PLATELET
Basophils Absolute: 0.3 10*3/uL — ABNORMAL HIGH (ref 0.0–0.1)
Basophils Relative: 0 % (ref 0–1)
EOS ABS: 1.1 10*3/uL — AB (ref 0.0–0.7)
Eosinophils Relative: 1 % (ref 0–5)
HCT: 31.5 % — ABNORMAL LOW (ref 36.0–46.0)
Hemoglobin: 10 g/dL — ABNORMAL LOW (ref 12.0–15.0)
LYMPHS PCT: 17 % (ref 12–46)
Lymphs Abs: 16.8 10*3/uL — ABNORMAL HIGH (ref 0.7–4.0)
MCH: 23.1 pg — ABNORMAL LOW (ref 26.0–34.0)
MCHC: 31.7 g/dL (ref 30.0–36.0)
MCV: 72.7 fL — AB (ref 78.0–100.0)
Monocytes Absolute: 8.3 10*3/uL — ABNORMAL HIGH (ref 0.1–1.0)
Monocytes Relative: 8 % (ref 3–12)
Neutro Abs: 73.5 10*3/uL — ABNORMAL HIGH (ref 1.7–7.7)
Neutrophils Relative %: 74 % (ref 43–77)
PLATELETS: 270 10*3/uL (ref 150–400)
RBC: 4.33 MIL/uL (ref 3.87–5.11)
RDW: 18 % — ABNORMAL HIGH (ref 11.5–15.5)
WBC: 14 10*3/uL — ABNORMAL HIGH (ref 4.0–10.5)

## 2015-04-21 LAB — COMPREHENSIVE METABOLIC PANEL
ALBUMIN: 2.7 g/dL — AB (ref 3.5–5.0)
ALT: 293 U/L — ABNORMAL HIGH (ref 14–54)
ANION GAP: 14 (ref 5–15)
AST: 186 U/L — ABNORMAL HIGH (ref 15–41)
Alkaline Phosphatase: 836 U/L — ABNORMAL HIGH (ref 38–126)
BUN: 5 mg/dL — ABNORMAL LOW (ref 6–20)
CHLORIDE: 105 mmol/L (ref 101–111)
CO2: 16 mmol/L — AB (ref 22–32)
Calcium: 8.9 mg/dL (ref 8.9–10.3)
Creatinine, Ser: 0.41 mg/dL — ABNORMAL LOW (ref 0.44–1.00)
Glucose, Bld: 140 mg/dL — ABNORMAL HIGH (ref 65–99)
Potassium: 3.7 mmol/L (ref 3.5–5.1)
Sodium: 135 mmol/L (ref 135–145)
Total Bilirubin: 9.2 mg/dL — ABNORMAL HIGH (ref 0.3–1.2)
Total Protein: 6.9 g/dL (ref 6.5–8.1)

## 2015-04-21 SURGERY — ERCP, WITH INTERVENTION IF INDICATED
Anesthesia: General

## 2015-04-21 MED ORDER — LACTATED RINGERS IV SOLN
INTRAVENOUS | Status: DC | PRN
  Administered 2015-04-21: 07:00:00 via INTRAVENOUS

## 2015-04-21 MED ORDER — CIPROFLOXACIN IN D5W 400 MG/200ML IV SOLN
400.0000 mg | Freq: Once | INTRAVENOUS | Status: AC
  Administered 2015-04-21: 400 mg via INTRAVENOUS

## 2015-04-21 MED ORDER — MIDAZOLAM HCL 2 MG/2ML IJ SOLN: 0.5000 mg | Freq: Once | INTRAMUSCULAR | Status: AC | PRN

## 2015-04-21 MED ORDER — ALTEPLASE 2 MG IJ SOLR
2.0000 mg | Freq: Once | INTRAMUSCULAR | Status: DC
  Filled 2015-04-21: qty 2

## 2015-04-21 MED ORDER — DIPHENHYDRAMINE HCL 50 MG/ML IJ SOLN
INTRAMUSCULAR | Status: DC | PRN
  Administered 2015-04-21: 12.5 mg via INTRAVENOUS

## 2015-04-21 MED ORDER — CIPROFLOXACIN IN D5W 400 MG/200ML IV SOLN
INTRAVENOUS | Status: AC
  Filled 2015-04-21: qty 200

## 2015-04-21 MED ORDER — MIDAZOLAM HCL 5 MG/5ML IJ SOLN
INTRAMUSCULAR | Status: DC | PRN
  Administered 2015-04-21: 2 mg via INTRAVENOUS

## 2015-04-21 MED ORDER — SODIUM CHLORIDE 0.9 % IV SOLN: INTRAVENOUS | Status: DC

## 2015-04-21 MED ORDER — MEPERIDINE HCL 50 MG/ML IJ SOLN: 6.2500 mg | INTRAMUSCULAR | Status: DC | PRN

## 2015-04-21 MED ORDER — INDOMETHACIN 50 MG RE SUPP
100.0000 mg | Freq: Once | RECTAL | Status: DC
  Filled 2015-04-21: qty 2

## 2015-04-21 MED ORDER — SUCCINYLCHOLINE CHLORIDE 20 MG/ML IJ SOLN
INTRAMUSCULAR | Status: DC | PRN
  Administered 2015-04-21: 80 mg via INTRAVENOUS

## 2015-04-21 MED ORDER — ENSURE ENLIVE PO LIQD
237.0000 mL | Freq: Two times a day (BID) | ORAL | Status: DC
  Administered 2015-04-21 – 2015-04-22 (×3): 237 mL via ORAL

## 2015-04-21 MED ORDER — PROPOFOL 10 MG/ML IV BOLUS
INTRAVENOUS | Status: AC
  Filled 2015-04-21: qty 20

## 2015-04-21 MED ORDER — DEXAMETHASONE SODIUM PHOSPHATE 10 MG/ML IJ SOLN
INTRAMUSCULAR | Status: DC | PRN
  Administered 2015-04-21: 10 mg via INTRAVENOUS

## 2015-04-21 MED ORDER — INDOMETHACIN 50 MG RE SUPP
RECTAL | Status: AC
  Filled 2015-04-21: qty 2

## 2015-04-21 MED ORDER — MIDAZOLAM HCL 2 MG/2ML IJ SOLN
INTRAMUSCULAR | Status: AC
  Filled 2015-04-21: qty 2

## 2015-04-21 MED ORDER — FENTANYL CITRATE (PF) 100 MCG/2ML IJ SOLN
INTRAMUSCULAR | Status: AC
  Filled 2015-04-21: qty 2

## 2015-04-21 MED ORDER — LACTATED RINGERS IV SOLN
INTRAVENOUS | Status: DC
  Administered 2015-04-21: 12:00:00 via INTRAVENOUS

## 2015-04-21 MED ORDER — FENTANYL CITRATE (PF) 100 MCG/2ML IJ SOLN
INTRAMUSCULAR | Status: DC | PRN
  Administered 2015-04-21 (×2): 25 ug via INTRAVENOUS
  Administered 2015-04-21: 50 ug via INTRAVENOUS

## 2015-04-21 MED ORDER — LIDOCAINE HCL (CARDIAC) 20 MG/ML IV SOLN
INTRAVENOUS | Status: DC | PRN
  Administered 2015-04-21: 50 mg via INTRAVENOUS

## 2015-04-21 MED ORDER — ONDANSETRON HCL 4 MG/2ML IJ SOLN
INTRAMUSCULAR | Status: DC | PRN
  Administered 2015-04-21: 4 mg via INTRAVENOUS

## 2015-04-21 MED ORDER — INDOMETHACIN 50 MG RE SUPP
RECTAL | Status: DC | PRN
  Administered 2015-04-21: 50 mg via RECTAL

## 2015-04-21 MED ORDER — PROPOFOL 10 MG/ML IV BOLUS
INTRAVENOUS | Status: DC | PRN
  Administered 2015-04-21: 140 mg via INTRAVENOUS

## 2015-04-21 MED ORDER — SODIUM CHLORIDE 0.9 % IV SOLN
INTRAVENOUS | Status: DC | PRN
  Administered 2015-04-21: 20 mL

## 2015-04-21 MED ORDER — HYDROMORPHONE HCL 1 MG/ML IJ SOLN: 0.2500 mg | INTRAMUSCULAR | Status: DC | PRN

## 2015-04-21 MED ORDER — PROMETHAZINE HCL 25 MG/ML IJ SOLN: 6.2500 mg | INTRAMUSCULAR | Status: DC | PRN

## 2015-04-21 SURGICAL SUPPLY — 1 items: 10x60 Uncovered Metal Biliary Stent ×3 IMPLANT

## 2015-04-21 NOTE — H&P (View-Only) (Signed)
[facility] (covering for Dr. Adriana Mccallum this weekend)  Referring Provider: [place], Holli Humbles, NP Primary Care Physician:  Annye Asa, MD Primary Gastroenterologist:  Dr. Carol Ada  Reason for Consultation: Elevated liver tests   HPI:  Jacqueline Hahn is a 69 y.o. female with known metastatic pancreatic adenocarcinoma, diagnosed Stage IV 06/2013; underwent ERCP around time of diagnosis, Dr. Fuller Plan, placement of 10Fr 7cm plastic biliary stent.  Recently she's been feeling poorly, about a week ago she had darker than usual urine, was felt to possibly hve UTI and started on abx (including bactrim).  + fevers around that time.  LFTs were not checked.  She only took 1-2 days of the abx, tells me they made her feel sick, pruritis started around the same time.  In follow up oncology visity labs yesterday showed significant increase in her liver tests (t bili up to 8) and CT scan confirmed increased dilation of intra/extrahepatic ducts. I personally reviewed the images and report (see below).   Past Medical History  Diagnosis Date  . Hypertension   . UTI (urinary tract infection)   . History of stomach ulcers   . Diabetes mellitus without complication   . Complication of anesthesia     hx of n/v  . PONV (postoperative nausea and vomiting)   . Pancreatic cancer metastasized to lung 07/10/2014    Past Surgical History  Procedure Laterality Date  . Dental surgery    . Bunionectomy    . Ercp N/A 06/23/2014    Procedure: ENDOSCOPIC RETROGRADE CHOLANGIOPANCREATOGRAPHY (ERCP);  Surgeon: Ladene Artist, MD;  Location: [facility] ENDOSCOPY;  Service: Endoscopy;  Laterality: N/A;  . Eus N/A 06/29/2014    Procedure: UPPER ENDOSCOPIC ULTRASOUND (EUS) LINEAR;  Surgeon: Beryle Beams, MD;  Location: [facility] ENDOSCOPY;  Service: Endoscopy;  Laterality: N/A;  . Fine needle aspiration N/A 06/29/2014    Procedure: FINE NEEDLE ASPIRATION (FNA) LINEAR;  Surgeon: Beryle Beams, MD;  Location: [facility] ENDOSCOPY;   Service: Endoscopy;  Laterality: N/A;    Prior to Admission medications   Medication Sig Start Date End Date Taking? Authorizing Provider  amLODipine (NORVASC) 10 MG tablet Take 1 tablet (10 mg total) by mouth daily. 10/10/14  Yes [name], MD  Calcium Carbonate-Vitamin D (CALCIUM + D PO) Take 1 tablet by mouth daily.   Yes Historical Provider, MD  Flaxseed, Linseed, (FLAX PO) Take 1 tablet by mouth daily.   Yes Historical Provider, MD  glipiZIDE-metformin (METAGLIP) 5-500 MG per tablet Take 1 tablet by mouth 2 (two) times daily before a meal. 02/22/15  Yes [name], MD  lansoprazole (PREVACID) 15 MG capsule Take 15 mg by mouth daily as needed (for heartburn).    Yes Historical Provider, MD  lidocaine-prilocaine (EMLA) cream Apply 1 application topically as needed. Apply quarter sized amount to portacath site at least one hour prior to chemo treatment 07/10/14  Yes [name], MD  megestrol (MEGACE) 400 MG/10ML suspension Take 800 mg by mouth daily as needed (only uses when stomach is upset or feels stomach "needs" it).    Yes Historical Provider, MD  Multiple Vitamin (MULTIVITAMIN WITH MINERALS) TABS tablet Take 1 tablet by mouth daily.   Yes Historical Provider, MD  Pyridoxine HCl (VITAMIN B-6) 250 MG tablet Take 1 tablet (250 mg total) by mouth daily. Patient taking differently: Take 1,250 mg by mouth daily. Taking 1250 mg (5 tablets) daily. 02/22/15  Yes [name], MD  vitamin E 1000 UNIT capsule Take 1,000  Units by mouth daily.   Yes Historical Provider, MD  cephALEXin (KEFLEX) 500 MG capsule Take 1 capsule (500 mg total) by mouth 4 (four) times daily. Patient not taking: Reported on 04/19/2015 04/10/15   Daleen Bo, MD  sulfamethoxazole-trimethoprim (BACTRIM) 400-80 MG per tablet Take 1 tablet by mouth 2 (two) times daily. Take for 5 days. Patient not taking: Reported on 04/19/2015 04/15/15   [name], MD    Current Facility-Administered Medications  Medication  Dose Route Frequency Provider Last Rate Last Dose  . 0.9 %  sodium chloride infusion   Intravenous Continuous [name], MD 75 mL/hr at 04/19/15 1713    . amLODipine (NORVASC) tablet 10 mg  10 mg Oral Daily [name], MD   10 mg at 04/19/15 1732  . [START ON 04/21/2015] glipiZIDE (GLUCOTROL) tablet 5 mg  5 mg Oral BID AC [name], MD       And  . Derrill Memo ON 04/21/2015] metFORMIN (GLUCOPHAGE) tablet 500 mg  500 mg Oral BID AC [name], MD      . glipiZIDE (GLUCOTROL) tablet 5 mg  5 mg Oral BID AC [name], MD   5 mg at 04/19/15 1842  . HYDROmorphone (DILAUDID) injection 1 mg  1 mg Intravenous Q4H PRN [name], MD   1 mg at 04/19/15 1802  . multivitamin with minerals tablet 1 tablet  1 tablet Oral Daily [name], MD   1 tablet at 04/19/15 1834  . pantoprazole (PROTONIX) EC tablet 40 mg  40 mg Oral Daily [name], MD   40 mg at 04/19/15 1834  . polyethylene glycol (MIRALAX / GLYCOLAX) packet 17 g  17 g Oral Daily PRN [name], MD      . pyridOXINE (VITAMIN B-6) tablet 250 mg  250 mg Oral Daily [name], MD   250 mg at 04/19/15 1835    Allergies as of 04/19/2015  . (No Known Allergies)    Family History  Problem Relation Age of Onset  . Heart attack Mother   . Stroke Mother   . Heart attack Father   . Hyperlipidemia Sister   . Hypertension Sister   . Hypertension Brother   . Alcohol abuse Brother     History   Social History  . Marital Status: Single    Spouse Name: N/A  . Number of Children: N/A  . Years of Education: N/A   Occupational History  . Not on file.   Social History Main Topics  . Smoking status: Former Smoker -- 1.00 packs/day for 4 years    Types: Cigarettes    Start date: 02/03/1963    Quit date: 03/05/1967  . Smokeless tobacco: Never Used     Comment: quit 45 years ago  . Alcohol Use: No  . Drug Use: No  . Sexual Activity: Not on file   Other Topics Concern  . Not on file   Social  History Narrative     Review of Systems: Pertinent positive and negative review of systems were noted in the above HPI section. Complete review of systems was performed and was otherwise normal.   Physical Exam: Vital signs in last 24 hours: Temp:  [97.8 F (36.6 C)-99.1 F (37.3 C)] 99.1 F (37.3 C) (06/18 0532) Pulse Rate:  [96-120] 96 (06/18 0532) Resp:  [16-20] 20 (06/18 0532) BP: (131-171)/(65-95) 171/73 mmHg (06/18 0532) SpO2:  [100 %] 100 % (06/18 0532) Weight:  [  129 lb 14.4 oz (58.922 kg)-136 lb 4.8 oz (61.825 kg)] 136 lb 4.8 oz (61.825 kg) (06/18 0532)   Constitutional: generally well-appearing Psychiatric: alert and oriented x3 Eyes: extraocular movements intact Mouth: oral pharynx moist, no lesions Neck: supple no lymphadenopathy Cardiovascular: heart regular rate and rhythm Lungs: clear to auscultation bilaterally Abdomen: soft, nontender, nondistended, no obvious ascites, no peritoneal signs, normal bowel sounds Extremities: no lower extremity edema bilaterally Skin: no lesions on visible extremities  Lab Results:  Recent Labs  04/19/15 0804  WBC 17.8*  HGB 11.7  HCT 32.9*  PLT 569*  MCV 68*   BMET  Recent Labs  04/19/15 0804  NA 131  K 4.1  CL 100  CO2 21  GLUCOSE 157*  BUN 9  CREATININE 0.9  ICT   CALCIUM 10.2   LFT  Recent Labs  04/19/15 0804  BILITOT 8.00*  AST 220*  ALT 370*  ALKPHOS 947*  PROT 7.0    Imaging/Other Results: Ct Abdomen W Contrast  04/19/2015   CLINICAL DATA:  Pancreatic cancer with lung metastases.  EXAM: CT ABDOMEN WITH CONTRAST  TECHNIQUE: Multidetector CT imaging of the abdomen was performed using the standard protocol following bolus administration of intravenous contrast.  CONTRAST:  145mL OMNIPAQUE IOHEXOL 300 MG/ML  SOLN  COMPARISON:  03/07/2015.  FINDINGS: Lower chest: Image quality is degraded in the lung bases due to respiratory motion. Irregular nodular densities are again seen in the lower lobes,  difficult to measure due to morphology and respiratory motion. Heart size normal. No pericardial or pleural effusion.  Hepatobiliary: Previously seen left hepatic lobe hemangioma is poorly visualized. Progressive intrahepatic and extrahepatic biliary ductal dilatation. Common bile duct measures up to 1.7 cm, with fairly abrupt cut off in the pancreatic head (series 2, image 23 and coronal image 45). A common bile duct stent is in place, terminating just beyond the ampulla, as before. A faint low-attenuation lesion in the inferior right hepatic lobe measures approximately 8 mm (series 2, image 34), possibly stable. Gallbladder is unremarkable.  Pancreas: Heterogeneous mass in the pancreatic body measures 1.7 x 2.2 cm, stable. There is distal gland atrophy and mild ductal dilatation, measuring up to 4 mm, as before. A heterogeneous area previously measured in the uncinate process of the pancreas is not as readily evident on the current exam.  Spleen: Negative.  Adrenals/Urinary Tract: Adrenal glands and kidneys are unremarkable. Visualized portions of the ureters are decompressed.  Stomach/Bowel: Stomach and visualized portions of the small bowel and colon are grossly unremarkable.  Vascular/Lymphatic: Atherosclerotic calcification of the arterial vasculature without abdominal aortic aneurysm. Hepatic duodenal lymph node measures 2.2 x 4.0 cm (series 2, image 21), previously 1.5 x 3.0 cm. Retroperitoneal aortocaval lymph nodes measure up to 7 mm and appear slightly more prominent. Gastrohepatic ligament lymph nodes are not enlarged by CT size criteria. Small bowel mesenteric lymph node measures 11 mm (series 2, image 29), previously 9 mm. Peripancreatic lymph node measures 10 mm (series 2, image 20), previously 8 mm.  Other: No free fluid. Omental nodules measure up to 5 mm, likely stable. Tiny periumbilical hernia contains fat.  Musculoskeletal: A sclerotic lesion in the T10 vertebral body is unchanged. Prominent  Schmorl's node and mild compression involving the superior endplate of the L1 vertebral body appear unchanged.  IMPRESSION: 1. Progressive intrahepatic and extrahepatic biliary ductal dilatation with abrupt tapering in the pancreatic head. No obvious pancreatic head mass. 2. Pancreatic body mass is stable. Lesion previously seen in the uncinate  process is not as well visualized. 3. Progressive peripancreatic, hepatoduodenal and mesenteric adenopathy. 4. Stable omental nodularity. 5. Image quality in the lung bases is degraded by respiratory motion. Nodular lesions in the lung bases are grossly stable and by history, are metastatic. 6. Stable T10 metastasis.   Electronically Signed   By: Lorin Picket M.D.   On: 04/19/2015 11:46    Impression/Plan: 69 y.o. female with rising liver tests, progressive dilation of biliary tract  Interesting that she took bactrim about a week ago, this can cause cholestasis but I think most likely her jaundice, elevated liver tests are from biliary process. The previous plastic stent is likely occluded. Will plan to removed it and replace with more permanent one today. Will giv oral cipro this AM.  IF stent exchange goes well, she will be safe for d/c later today from GI perspective.    Milus Banister, MD  04/20/2015, 5:34 AM [facility] Pager [phone]

## 2015-04-21 NOTE — Progress Notes (Signed)
Initial Nutrition Assessment  DOCUMENTATION CODES:  Severe malnutrition in context of acute illness/injury  INTERVENTION: - Will order Ensure Enlive po BID, each supplement provides 350 kcal and 20 grams of protein - RD will continue to monitor for needs  NUTRITION DIAGNOSIS:  Increased nutrient needs related to cancer and cancer related treatments, chronic illness as evidenced by estimated needs.  GOAL:  Patient will meet greater than or equal to 90% of their needs  MONITOR:  PO intake, Supplement acceptance, Weight trends, Labs, I & O's  REASON FOR ASSESSMENT:  Malnutrition Screening Tool, Consult Poor PO  ASSESSMENT: 69 y.o. female with a diagnosis of metastatic adenocarcinoma of the pancreas, admitted to [facility] from the [place] at [facility] due to symptomatic jaundice, in order to continue with her cycle of chemotherapy with gemcitabine and Abraxane. Since 6/8 she has been experiencing low grade fevers and headaches, abdominal pain. CT of the abdomen and pelvis on 6/17 showed progressive intrahepatic and extrahepatic biliary ductal dilatation with abrupt tapering in the pancreatic head, no obvious pancreatic head mass. Pancreatic body mass is stable. Lesion previously seen in the uncinate process is not as well visualized Progressive peripancreatic, hepatoduodenal and mesenteric adenopathy was noted.   Pt seen for consult for poor PO with unintentional 8 lb weight loss in 1 week due to poor appetite. BMI indicates overweight status. Pt ate 50% of grits, sausage, and egg for breakfast this AM.   She reports improving appetite at this time but that PTA she had poor appetite x2 weeks. She states that she felt she had a bladder infection starting around that time and that it caused her some abdominal pain and nausea and loss of appetite. She does not feel that she lost weight during this time. Chart review indicates weight gain/stable weight since December.   In consult note  it indicated that pt does not like Ensure as it is too sweet for her. Asked pt about this to confirm. She states that she does think it is too sweet but she can drink it if she mixes it with another protein shake. Talked with her about mixing it with milk while in the hospital and pt states she is willing to try this.  She had ERCP and replaced biliary stent today. Hx of stage 4 pancreatic cancer.  Likely not meeting needs PTA. Physical assessment shows moderate muscle and mild fat wasting. Medications reviewed. Labs reviewed; BUN/creatinine low, LFTs elevated.  Height:  Ht Readings from Last 1 Encounters:  04/19/15 5' (1.524 m)    Weight:  Wt Readings from Last 1 Encounters:  04/21/15 141 lb 9.6 oz (64.229 kg)    Ideal Body Weight:  45.45 kg  Wt Readings from Last 10 Encounters:  04/21/15 141 lb 9.6 oz (64.229 kg)  04/19/15 131 lb 8 oz (59.648 kg)  04/10/15 138 lb (62.596 kg)  03/22/15 138 lb (62.596 kg)  02/22/15 136 lb (61.689 kg)  01/25/15 136 lb (61.689 kg)  12/26/14 136 lb (61.689 kg)  11/28/14 137 lb (62.143 kg)  11/14/14 134 lb (60.782 kg)  10/31/14 136 lb (61.689 kg)    BMI:  Body mass index is 27.65 kg/(m^2).  Estimated Nutritional Needs:  Kcal:  1900-2100  Protein:  95-105 grams  Fluid:  2.2-2.5 L/day  Skin:  Reviewed, no issues  Diet Order:  Diet regular Room service appropriate?: Yes; Fluid consistency:: Thin  EDUCATION NEEDS:  No education needs identified at this time   Intake/Output Summary (Last 24 hours) at  04/21/15 1055 Last data filed at 04/21/15 1012  Gross per 24 hour  Intake   1675 ml  Output   2800 ml  Net  -1125 ml    Last BM:  PTA    Jarome Matin, RD, LDN Inpatient Clinical Dietitian Pager # [phone] After hours/weekend pager # [phone]

## 2015-04-21 NOTE — Anesthesia Postprocedure Evaluation (Signed)
  Anesthesia Post-op Note  Patient: Jacqueline Hahn  Procedure(s) Performed: Procedure(s): ENDOSCOPIC RETROGRADE CHOLANGIOPANCREATOGRAPHY (ERCP) with stent removal and stent replacement (N/A)  Patient Location: PACU  Anesthesia Type:General  Level of Consciousness: awake, alert , oriented and patient cooperative  Airway and Oxygen Therapy: Patient Spontanous Breathing  Post-op Pain: none  Post-op Assessment: Post-op Vital signs reviewed, Patient's Cardiovascular Status Stable, Respiratory Function Stable, Patent Airway, No signs of Nausea or vomiting and Pain level controlled              Post-op Vital Signs: Reviewed and stable  Last Vitals:  Filed Vitals:   04/21/15 0833  BP: 170/72  Pulse: 85  Temp: 36.5 C  Resp: 16    Complications: No apparent anesthesia complications

## 2015-04-21 NOTE — Progress Notes (Signed)
Jacqueline Hahn   DOB:01-01-1946   GE#:[id number]   YYT#:[id number]  Subjective: had a difficult day yesterday but did try to ambulate some. Underwent stent exchange this AM w/o event. Denies pain, N/V, cramps. No family in room   Objective: middle aged AA woman examined in bed Filed Vitals:   04/21/15 0833  BP: 170/72  Pulse: 85  Temp: 97.7 F (36.5 C)  Resp: 16    Body mass index is 27.65 kg/(m^2).  Intake/Output Summary (Last 24 hours) at 04/21/15 1001 Last data filed at 04/21/15 0824  Gross per 24 hour  Intake   1675 ml  Output   2500 ml  Net   -825 ml     Sclerae icteric, pupils round and equal  Oropharynx no thrush  Lungs clear -- auscultated anterolaterally  Heart regular rate and rhythm  Abdomen soft, nontender, no masses palpated  Neuro nonfocal   CBG (last 3)  No results for input(s): GLUCAP in the last 72 hours.   Labs:  Lab Results  Component Value Date   WBC 14.0* 04/21/2015   HGB 10.0* 04/21/2015   HCT 31.5* 04/21/2015   MCV 72.7* 04/21/2015   PLT 270 04/21/2015   NEUTROABS 73.5* 04/21/2015    @LASTCHEMISTRY @  Urine Studies No results for input(s): UHGB, CRYS in the last 72 hours.  Invalid input(s): UACOL, UAPR, USPG, UPH, UTP, UGL, UKET, UBIL, UNIT, UROB, ULEU, UEPI, UWBC, URBC, [place], [place]  Basic Metabolic Panel:  Recent Labs Lab 04-23-15 0804 04/20/15 0706 04/21/15 0530  NA 131 136 135  K 4.1 3.3* 3.7  CL 100 107 105  CO2 21 19* 16*  GLUCOSE 157* 117* 140*  BUN 9 7 5*  CREATININE 0.9  ICT  0.36* 0.41*  CALCIUM 10.2 8.8* 8.9   GFR Estimated Creatinine Clearance: 57.1 mL/min (by C-G formula based on Cr of 0.41). Liver Function Tests:  Recent Labs Lab Apr 23, 2015 0804 04/20/15 0706 04/21/15 0530  AST 220* 192* 186*  ALT 370* 314* 293*  ALKPHOS 947* 850* 836*  BILITOT 8.00* 8.9* 9.2*  PROT 7.0 7.0 6.9  ALBUMIN  --  2.6* 2.7*   No results for input(s): LIPASE, AMYLASE in the last 168 hours. No results for input(s):  AMMONIA in the last 168 hours. Coagulation profile  Recent Labs Lab 04/20/15 0706  INR 1.16    CBC:  Recent Labs Lab 2015/04/23 0804 04/20/15 0706 04/21/15 0530  WBC 17.8* 17.7* 14.0*  NEUTROABS 12.1* 13.0* 73.5*  HGB 11.7 10.3* 10.0*  HCT 32.9* 31.3* 31.5*  MCV 68* 72.1* 72.7*  PLT 569* 638* 270   Cardiac Enzymes: No results for input(s): CKTOTAL, CKMB, CKMBINDEX, TROPONINI in the last 168 hours. BNP: Invalid input(s): POCBNP CBG: No results for input(s): GLUCAP in the last 168 hours. D-Dimer No results for input(s): DDIMER in the last 72 hours. Hgb A1c No results for input(s): HGBA1C in the last 72 hours. Lipid Profile No results for input(s): CHOL, HDL, LDLCALC, TRIG, CHOLHDL, LDLDIRECT in the last 72 hours. Thyroid function studies No results for input(s): TSH, T4TOTAL, T3FREE, THYROIDAB in the last 72 hours.  Invalid input(s): FREET3 Anemia work up No results for input(s): VITAMINB12, FOLATE, FERRITIN, TIBC, IRON, RETICCTPCT in the last 72 hours. Microbiology No results found for this or any previous visit (from the past 240 hour(s)).    Studies:  Ct Abdomen W Contrast  04-23-2015   CLINICAL DATA:  Pancreatic cancer with lung metastases.  EXAM: CT ABDOMEN WITH CONTRAST  TECHNIQUE: Multidetector  CT imaging of the abdomen was performed using the standard protocol following bolus administration of intravenous contrast.  CONTRAST:  16mL OMNIPAQUE IOHEXOL 300 MG/ML  SOLN  COMPARISON:  03/07/2015.  FINDINGS: Lower chest: Image quality is degraded in the lung bases due to respiratory motion. Irregular nodular densities are again seen in the lower lobes, difficult to measure due to morphology and respiratory motion. Heart size normal. No pericardial or pleural effusion.  Hepatobiliary: Previously seen left hepatic lobe hemangioma is poorly visualized. Progressive intrahepatic and extrahepatic biliary ductal dilatation. Common bile duct measures up to 1.7 cm, with fairly  abrupt cut off in the pancreatic head (series 2, image 23 and coronal image 45). A common bile duct stent is in place, terminating just beyond the ampulla, as before. A faint low-attenuation lesion in the inferior right hepatic lobe measures approximately 8 mm (series 2, image 34), possibly stable. Gallbladder is unremarkable.  Pancreas: Heterogeneous mass in the pancreatic body measures 1.7 x 2.2 cm, stable. There is distal gland atrophy and mild ductal dilatation, measuring up to 4 mm, as before. A heterogeneous area previously measured in the uncinate process of the pancreas is not as readily evident on the current exam.  Spleen: Negative.  Adrenals/Urinary Tract: Adrenal glands and kidneys are unremarkable. Visualized portions of the ureters are decompressed.  Stomach/Bowel: Stomach and visualized portions of the small bowel and colon are grossly unremarkable.  Vascular/Lymphatic: Atherosclerotic calcification of the arterial vasculature without abdominal aortic aneurysm. Hepatic duodenal lymph node measures 2.2 x 4.0 cm (series 2, image 21), previously 1.5 x 3.0 cm. Retroperitoneal aortocaval lymph nodes measure up to 7 mm and appear slightly more prominent. Gastrohepatic ligament lymph nodes are not enlarged by CT size criteria. Small bowel mesenteric lymph node measures 11 mm (series 2, image 29), previously 9 mm. Peripancreatic lymph node measures 10 mm (series 2, image 20), previously 8 mm.  Other: No free fluid. Omental nodules measure up to 5 mm, likely stable. Tiny periumbilical hernia contains fat.  Musculoskeletal: A sclerotic lesion in the T10 vertebral body is unchanged. Prominent Schmorl's node and mild compression involving the superior endplate of the L1 vertebral body appear unchanged.  IMPRESSION: 1. Progressive intrahepatic and extrahepatic biliary ductal dilatation with abrupt tapering in the pancreatic head. No obvious pancreatic head mass. 2. Pancreatic body mass is stable. Lesion previously  seen in the uncinate process is not as well visualized. 3. Progressive peripancreatic, hepatoduodenal and mesenteric adenopathy. 4. Stable omental nodularity. 5. Image quality in the lung bases is degraded by respiratory motion. Nodular lesions in the lung bases are grossly stable and by history, are metastatic. 6. Stable T10 metastasis.   Electronically Signed   By: Lorin Picket M.D.   On: 04/19/2015 11:46   Dg Ercp  04/21/2015   CLINICAL DATA:  Biliary stent obstruction, ERCP  EXAM: ERCP with stent replacement  TECHNIQUE: Multiple spot images obtained with the fluoroscopic device and submitted for interpretation post-procedure.  FLUOROSCOPY TIME:  Fluoroscopy Time:  1 minutes 26 seconds  COMPARISON:  CT 04/19/2015  FINDINGS: Two intraprocedural fluoroscopic images are provided, 1 of which demonstrates proximal common duct dilation. Contrast is noted within the duodenum. There is focal cut off at the level of the mid common duct. Image quality is degraded by motion and no filling defect is identified.  IMPRESSION: Common duct dilatation with suboptimal visualization but focal cut off is seen at the mid duct. Correlate with findings at real time imaging.  These images were submitted for  radiologic interpretation only. Please see the procedural report for the amount of contrast and the fluoroscopy time utilized.   Electronically Signed   By: Conchita Paris M.D.   On: 04/21/2015 09:34    Assessment: 69 y.o. [place] woman admitted with biliary obstruction from stage IV pancreatic cancer  (1) initial diagnosis 06/29/2014 vis FNA BX of pancreatic head  (2) on gemcitabine/ abraxane starting 07/18/2014, most recent dose 03/22/2015, with reassessment by labs and CT then showing stable/minimally progressive disease but a rising CA19  (a) additional chemotherapy incorporating platinum compounds was  planned as outpatient  (3) CT 04/19/2015 shows worsening adenopathy and intra/extrahepatic biliary ductal  dilatation, with t bil currently 8.0 (was <1.0 May 2016)-- ERCP performed 04/21/2015 with stent removal and replacement  (4) advanced directives-- patient is working on the papers, to be notarized when ready  . Plan:  Ms Ragin tolerated the ERCP remarkably well. If she does not develop significant pain, N/V or other complications she should be ready for discharge tomorrow-- note patient lives by herself and has no transportaton today.  Given the patient's progressive disease it is appropriate for her to "plan for the worst while hoping for the best." She will be discussing advanced directives as well as future treatment plans  with Dr Marin Olp tomorrow.  Will check t bil in AM to confirm initial response  Chauncey Cruel, MD 04/21/2015  10:01 AM Medical Oncology and Hematology [facility] [place], [place] [address] Tel. [phone]    Fax. [phone]

## 2015-04-21 NOTE — Anesthesia Preprocedure Evaluation (Addendum)
Anesthesia Evaluation  Patient identified by MRN, date of birth, ID band Patient awake    Reviewed: Allergy & Precautions, NPO status , Patient's Chart, lab work & pertinent test results  History of Anesthesia Complications (+) PONV and history of anesthetic complications  Airway Mallampati: II  TM Distance: >3 FB Neck ROM: Full    Dental  (+) Edentulous Upper, Dental Advisory Given   Pulmonary former smoker (quit 1968),  Lung mets: pancreatic cancer breath sounds clear to auscultation        Cardiovascular hypertension, Pt. on medications - anginaRhythm:Regular Rate:Normal     Neuro/Psych negative neurological ROS     GI/Hepatic GERD-  Medicated and Controlled,Elevated LFTs Metastatic pancreatic cancer: chemo   Endo/Other  diabetes (glu 116), Oral Hypoglycemic Agents  Renal/GU negative Renal ROS     Musculoskeletal   Abdominal   Peds  Hematology  (+) Blood dyscrasia (Hb 10.3), ,   Anesthesia Other Findings   Reproductive/Obstetrics                           Anesthesia Physical Anesthesia Plan  ASA: III  Anesthesia Plan: General   Post-op Pain Management:    Induction: Intravenous  Airway Management Planned: Oral ETT  Additional Equipment:   Intra-op Plan:   Post-operative Plan: Extubation in OR  Informed Consent: I have reviewed the patients History and Physical, chart, labs and discussed the procedure including the risks, benefits and alternatives for the proposed anesthesia with the patient or authorized representative who has indicated his/her understanding and acceptance.   Dental advisory given  Plan Discussed with: Surgeon and CRNA  Anesthesia Plan Comments: (Plan routine monitors, GETA)        Anesthesia Quick Evaluation

## 2015-04-21 NOTE — Transfer of Care (Signed)
Immediate Anesthesia Transfer of Care Note  Patient: Jacqueline Hahn  Procedure(s) Performed: Procedure(s): ENDOSCOPIC RETROGRADE CHOLANGIOPANCREATOGRAPHY (ERCP) with stent removal and stent replacement (N/A)  Patient Location: PACU  Anesthesia Type:General  Level of Consciousness:  sedated, patient cooperative and responds to stimulation  Airway & Oxygen Therapy:Patient Spontanous Breathing and Patient connected to face mask oxgen  Post-op Assessment:  Report given to PACU RN and Post -op Vital signs reviewed and stable  Post vital signs:  Reviewed and stable  Last Vitals:  Filed Vitals:   04/21/15 0805  BP:   Pulse: 102  Temp: 37.1 C  Resp: 18    Complications: No apparent anesthesia complications

## 2015-04-21 NOTE — Anesthesia Procedure Notes (Signed)
Procedure Name: Intubation Date/Time: 04/21/2015 7:22 AM Performed by: Anne Fu Pre-anesthesia Checklist: Patient identified, Emergency Drugs available, Suction available, Patient being monitored and Timeout performed Patient Re-evaluated:Patient Re-evaluated prior to inductionOxygen Delivery Method: Circle system utilized Preoxygenation: Pre-oxygenation with 100% oxygen Intubation Type: IV induction Ventilation: Mask ventilation without difficulty Laryngoscope Size: Mac and 4 Grade View: Grade I Tube type: Oral Tube size: 7.5 mm Number of attempts: 1 Airway Equipment and Method: Stylet Placement Confirmation: ETT inserted through vocal cords under direct vision,  positive ETCO2,  CO2 detector and breath sounds checked- equal and bilateral Secured at: 20 cm Tube secured with: Tape Dental Injury: Teeth and Oropharynx as per pre-operative assessment

## 2015-04-21 NOTE — Op Note (Signed)
[facility] [place], [address]   ERCP PROCEDURE REPORT  PATIENT: Jacqueline Hahn, Jacqueline Hahn  MR#: [id number] BIRTHDATE: 11-Jul-1946  GENDER: female ENDOSCOPIST: Milus Banister, MD PROCEDURE DATE:  04/21/2015 PROCEDURE:   ERCP with stent removal and placement (exchange) INDICATIONS:elevated liver tests, dilated bile ducts; previously placed plastic biliary stent from 07/2014 presumed to be occluded. Known metastatic pancreatic cancer (covering for Drs. Mann/Hung this weekend) MEDICATIONS: Per Anesthesia and cipro 400mg  IV, indomethacin 100mg  PR TOPICAL ANESTHETIC:   none  DESCRIPTION OF PROCEDURE:     Physical exam was performed.  Informed consent was obtained from the patient after explaining the benefits, risks, and alternatives to the procedure.  The patient was connected to the monitor and placed in the semi-prone position. IV medicine was administered through an indwelling cannula and oxygen via endotracheal tube.  After administration of sedation, the patients esophagus was intubated and the Pentax Ercp Scope [id number]  endoscope was advanced under direct visualization to the second portion of the duodenum without detailed examination of the UGI tract.  The previously placed plastic biliary stent was nearly burried in the ampulla (even prior to manipulation) and was clearly occluded (see picture below). That stent was removed with a snare. A 44 Autotome over a .035 hydrawire was used to cannulate the bile duct and dye was injected. Cholangiogram revealved 2cm distal CBD stricture with proximal biliary tree dilation. There were no stones noted.  The stricture was bridged with placement of a 6cm long 79mm diameter UNcovered metal mesh biliary stent in good position. The distal 7-46mm of the stent extended into the duodenal lumen. Throughout the case and especially following new stent placement, copious pus drained into the duodenum. The main pancreatic duct  was never injected with dye or cannulated with wire.   The scope was then completely withdrawn from the patient and the procedure terminated.   COMPLICATIONS:    None  ENDOSCOPIC IMPRESSION: Occluded plastic biliary stent, replaced with 6cm long, 38mm diameter UNCOVERED metal mesh biliary stent.  There was purulence draining from the bile duct during the case and especially following new stent placement.  RECOMMENDATIONS: Ok to d/c later today. She should complete 5 more days of twice daily cipro. Follow up with Dr. Benson Norway as needed.  _______________________________ eSignedMilus Banister, MD 04/21/2015 8:17 AM   cc: Carol Ada, MD

## 2015-04-21 NOTE — Interval H&P Note (Signed)
History and Physical Interval Note:  04/21/2015 7:06 AM  Jacqueline Hahn  has presented today for surgery, with the diagnosis of pancreatic cancer, obstructive jaundice, stent exchange  The various methods of treatment have been discussed with the patient and family. After consideration of risks, benefits and other options for treatment, the patient has consented to  Procedure(s): ENDOSCOPIC RETROGRADE CHOLANGIOPANCREATOGRAPHY (ERCP) with stent removal and stent replacement (N/A) as a surgical intervention .  The patient's history has been reviewed, patient examined, no change in status, stable for surgery.  I have reviewed the patient's chart and labs.  Questions were answered to the patient's satisfaction.     Milus Banister

## 2015-04-22 ENCOUNTER — Encounter (HOSPITAL_COMMUNITY): Payer: Self-pay | Admitting: Gastroenterology

## 2015-04-22 ENCOUNTER — Other Ambulatory Visit: Payer: Self-pay | Admitting: Hematology & Oncology

## 2015-04-22 ENCOUNTER — Telehealth: Payer: Self-pay | Admitting: Hematology & Oncology

## 2015-04-22 ENCOUNTER — Ambulatory Visit
Admission: RE | Admit: 2015-04-22 | Discharge: 2015-04-22 | Disposition: A | Discharge status: Home / Self Care | Payer: Medicare Other | Source: Ambulatory Visit | Attending: Radiation Oncology | Admitting: Radiation Oncology

## 2015-04-22 ENCOUNTER — Other Ambulatory Visit: Payer: Self-pay | Admitting: *Deleted

## 2015-04-22 DIAGNOSIS — K831 Obstruction of bile duct: Secondary | ICD-10-CM | POA: Insufficient documentation

## 2015-04-22 DIAGNOSIS — C7800 Secondary malignant neoplasm of unspecified lung: Principal | ICD-10-CM

## 2015-04-22 DIAGNOSIS — Z510 Encounter for antineoplastic radiation therapy: Secondary | ICD-10-CM | POA: Insufficient documentation

## 2015-04-22 DIAGNOSIS — E43 Unspecified severe protein-calorie malnutrition: Secondary | ICD-10-CM

## 2015-04-22 DIAGNOSIS — C259 Malignant neoplasm of pancreas, unspecified: Secondary | ICD-10-CM | POA: Insufficient documentation

## 2015-04-22 DIAGNOSIS — F458 Other somatoform disorders: Secondary | ICD-10-CM | POA: Insufficient documentation

## 2015-04-22 DIAGNOSIS — R112 Nausea with vomiting, unspecified: Secondary | ICD-10-CM

## 2015-04-22 LAB — COMPREHENSIVE METABOLIC PANEL
ALT: 239 U/L — ABNORMAL HIGH (ref 14–54)
AST: 100 U/L — ABNORMAL HIGH (ref 15–41)
Albumin: 2.6 g/dL — ABNORMAL LOW (ref 3.5–5.0)
Alkaline Phosphatase: 757 U/L — ABNORMAL HIGH (ref 38–126)
Anion gap: 9 (ref 5–15)
BUN: 13 mg/dL (ref 6–20)
CHLORIDE: 105 mmol/L (ref 101–111)
CO2: 21 mmol/L — ABNORMAL LOW (ref 22–32)
CREATININE: 0.65 mg/dL (ref 0.44–1.00)
Calcium: 9.3 mg/dL (ref 8.9–10.3)
GFR calc Af Amer: >60 mL/min (ref >60)
Glucose, Bld: 262 mg/dL — ABNORMAL HIGH (ref 65–99)
Potassium: 3.7 mmol/L (ref 3.5–5.1)
Sodium: 135 mmol/L (ref 135–145)
Total Bilirubin: 3.6 mg/dL — ABNORMAL HIGH (ref 0.3–1.2)
Total Protein: 6.9 g/dL (ref 6.5–8.1)

## 2015-04-22 LAB — CBC WITH DIFFERENTIAL/PLATELET
BASOS ABS: 0 10*3/uL (ref 0.0–0.1)
BASOS PCT: 0 % (ref 0–1)
Eosinophils Absolute: 0 10*3/uL (ref 0.0–0.7)
Eosinophils Relative: 0 % (ref 0–5)
HCT: 30.3 % — ABNORMAL LOW (ref 36.0–46.0)
Hemoglobin: 10 g/dL — ABNORMAL LOW (ref 12.0–15.0)
Lymphocytes Relative: 19 % (ref 12–46)
Lymphs Abs: 3 10*3/uL (ref 0.7–4.0)
MCH: 23.4 pg — ABNORMAL LOW (ref 26.0–34.0)
MCHC: 33 g/dL (ref 30.0–36.0)
MCV: 71 fL — ABNORMAL LOW (ref 78.0–100.0)
MONO ABS: 1.5 10*3/uL — AB (ref 0.1–1.0)
Monocytes Relative: 10 % (ref 3–12)
NEUTROS ABS: 10.9 10*3/uL — AB (ref 1.7–7.7)
Neutrophils Relative %: 71 % (ref 43–77)
Platelets: 539 10*3/uL — ABNORMAL HIGH (ref 150–400)
RBC: 4.27 MIL/uL (ref 3.87–5.11)
RDW: 18.1 % — AB (ref 11.5–15.5)
WBC: 15.3 10*3/uL — ABNORMAL HIGH (ref 4.0–10.5)

## 2015-04-22 LAB — GLUCOSE, CAPILLARY
GLUCOSE-CAPILLARY: 110 mg/dL — AB (ref 65–99)
Glucose-Capillary: 116 mg/dL — ABNORMAL HIGH (ref 65–99)

## 2015-04-22 MED ORDER — CAPECITABINE 500 MG PO TABS: ORAL_TABLET | ORAL | Status: DC

## 2015-04-22 MED ORDER — FLUCONAZOLE 100 MG PO TABS
100.0000 mg | ORAL_TABLET | Freq: Once | ORAL | Status: AC
  Administered 2015-04-22: 100 mg via ORAL
  Filled 2015-04-22: qty 1

## 2015-04-22 MED ORDER — HEPARIN SOD (PORK) LOCK FLUSH 100 UNIT/ML IV SOLN
500.0000 [IU] | Freq: Once | INTRAVENOUS | Status: AC
  Administered 2015-04-22: 500 [IU] via INTRAVENOUS
  Filled 2015-04-22: qty 5

## 2015-04-22 MED ORDER — L-METHYLFOLATE-B6-B12 3-35-2 MG PO TABS: 1.0000 | ORAL_TABLET | Freq: Every day | ORAL | Status: AC

## 2015-04-22 NOTE — Progress Notes (Signed)
  Radiation Oncology         [phone] ________________________________  Name: Jacqueline Hahn  MRN: [id number]  Date: 04/22/2015  DOB: 1946-07-21  SIMULATION AND TREATMENT PLANNING NOTE    ICD-9-CM ICD-10-CM   1. Pancreas carcinoma 157.9 C25.9     DIAGNOSIS:  69 year old woman with lymphadenopathy from pancreatic cancer causing biliary obstruction.  NARRATIVE:  The patient was brought to the [place].  Identity was confirmed.  All relevant records and images related to the planned course of therapy were reviewed.  The patient freely provided informed written consent to proceed with treatment after reviewing the details related to the planned course of therapy. The consent form was witnessed and verified by the simulation staff.  Then, the patient was set-up in a stable reproducible  supine position for radiation therapy.  CT images were obtained.  Surface markings were placed.  The CT images were loaded into the planning software.  Then the target and avoidance structures were contoured.  Treatment planning then occurred.  The radiation prescription was entered and confirmed.  Then, I designed and supervised the construction of a total of 4 medically necessary complex treatment devices.  I have requested : 3D Simulation  I have requested a DVH of the following structures: left kidney, right kidney, spinal cord, stomach, and tumor area.  I have ordered:Nutrition Consult  SPECIAL TREATMENT PROCEDURE:  The planned course of therapy using radiation constitutes a special treatment procedure. Special care is required in the management of this patient for the following reasons. This treatment constitutes a Special Treatment Procedure for the following reason: [ Concurrent chemotherapy requiring careful monitoring for increased toxicities of treatment including weekly laboratory values. The special nature of the planned course of radiotherapy will require increased physician supervision and  oversight to ensure patient's safety with optimal treatment outcomes.  PLAN:  The patient will receive 45 Gy in 25 fraction.  This document serves as a record of services personally performed by Tyler Pita, MD. It was created on his behalf by Arlyce Harman, a trained medical scribe. The creation of this record is based on the scribe's personal observations and the provider's statements to them. This document has been checked and approved by the attending provider.    ________________________________  Sheral Apley. Tammi Klippel, M.D.

## 2015-04-22 NOTE — Discharge Summary (Signed)
Patient ID: Jacqueline Hahn MRN: [id number] DOB/AGE: 1945-12-20 69 y.o.  Admit date: 04/19/2015 Discharge date: 04/22/2015  Patient Care Team: Midge Minium, MD as PCP - General (Family Medicine)  Brief History of Present Illness: For complete details please refer to admission H and P, but in brief,  Jacqueline Hahn is a 69 y.o. [place] woman admitted with biliary obstruction from stage IV pancreatic cancer, admitted for the management of progression of the disease, with symptomatic jaundice  Discharge Diagnoses/Hospital Course:  Metastatic pancreatic cancer Patient was being admitted for plans for continuation of her chemotherapy plans with Gemcitabine and Abraxane, unable to be administered as outpatient due to symptomatic jaundice CT 04/19/2015 shows progressive intrahepatic and extrahepatic biliary ductal dilatation with abrupt tapering in the pancreatic head, no obvious pancreatic head mass. Pancreatic body mass is stable. Lesion previously seen in the uncinate process is not as well visualized Progressive peripancreatic, hepatoduodenal and mesenteric adenopathy was noted.  T bil at  8.0 (was <1.0 May 2016) ERCP was performed on  04/21/2015, stent exhange, tolerating procedure well Additional chemotherapy incorporating Xeloda was planned as outpatient Radiation Oncology to see as outpatient  Anemia in neoplastic disease This is stable No transfusion is indicated at this time  Leukocytosis This is likely reactive, recent Decadron No intervention is indicated at this time Will continue to monitor as outpatient  Nausea/ vomiting In the setting of progression of disease Improved after ERCP  Severe Malnutrition Appreciate Nutrition involvement Appetite is improving  Continue Ensure Enlive po BID  DVT prophylaxis On Lovenox and SCDs   Full Code   Past Medical History  Diagnosis Date  . Hypertension   . UTI (urinary tract infection)   . History of stomach ulcers    . Diabetes mellitus without complication   . Complication of anesthesia     hx of n/v  . PONV (postoperative nausea and vomiting)   . Pancreatic cancer metastasized to lung 07/10/2014    Discharge Medications:    Medication List    STOP taking these medications        cephALEXin 500 MG capsule  Commonly known as:  KEFLEX     sulfamethoxazole-trimethoprim 400-80 MG per tablet  Commonly known as:  BACTRIM     vitamin E 1000 UNIT capsule      TAKE these medications        amLODipine 10 MG tablet  Commonly known as:  NORVASC  Take 1 tablet (10 mg total) by mouth daily.     CALCIUM + D PO  Take 1 tablet by mouth daily.     FLAX PO  Take 1 tablet by mouth daily.     glipiZIDE-metformin 5-500 MG per tablet  Commonly known as:  METAGLIP  Take 1 tablet by mouth 2 (two) times daily before a meal.     lansoprazole 15 MG capsule  Commonly known as:  PREVACID  Take 15 mg by mouth daily as needed (for heartburn).     lidocaine-prilocaine cream  Commonly known as:  EMLA  Apply 1 application topically as needed. Apply quarter sized amount to portacath site at least one hour prior to chemo treatment     megestrol 400 MG/10ML suspension  Commonly known as:  MEGACE  Take 800 mg by mouth daily as needed (only uses when stomach is upset or feels stomach "needs" it).     multivitamin with minerals Tabs tablet  Take 1 tablet by mouth daily.     vitamin B-6 250 MG  tablet  Take 1 tablet (250 mg total) by mouth daily.        Discharge Condition: Improved.  Diet recommendation: Regular.  Consults: GI on 04/20/15   Disposition and Follow-up:      Discharge Instructions    Discharge patient    Complete by:  As directed              Physical Exam at Discharge:  Subjective: Doing well this morning without GI complaints. No respiratory, cardiac or GU issues.   Objective:  BP 143/82 mmHg  Pulse 98  Temp(Src) 98.3 F (36.8 C) (Oral)  Resp 18  Ht 5' (1.524 m)  Wt  144 lb (65.318 kg)  BMI 28.12 kg/m2  SpO2 100% GENERAL:alert, no distress and comfortable SKIN: skin color, texture, turgor are normal, no rashes or significant lesions EYES: normal, conjunctiva are pink and non-injected, sclera clear OROPHARYNX:no exudate, no erythema and lips, buccal mucosa, and tongue normal  NECK: supple, thyroid normal size, non-tender, without nodularity LYMPH:  no palpable lymphadenopathy in the cervical, axillary or inguinal area LUNGS: clear to auscultation and percussion with normal breathing effort HEART: regular rate & rhythm and no murmurs and no lower extremity edema ABDOMEN:abdomen soft, non-tender and normal bowel sounds Musculoskeletal:no cyanosis of digits and no clubbing  PSYCH: alert & oriented x 3 with fluent speech NEURO: no focal motor/sensory deficits    Significant Diagnostic Studies:  Ct Abdomen W Contrast  2015/05/05   CLINICAL DATA:  Pancreatic cancer with lung metastases.  EXAM: CT ABDOMEN WITH CONTRAST  TECHNIQUE: Multidetector CT imaging of the abdomen was performed using the standard protocol following bolus administration of intravenous contrast.  CONTRAST:  125mL OMNIPAQUE IOHEXOL 300 MG/ML  SOLN  COMPARISON:  03/07/2015.  FINDINGS: Lower chest: Image quality is degraded in the lung bases due to respiratory motion. Irregular nodular densities are again seen in the lower lobes, difficult to measure due to morphology and respiratory motion. Heart size normal. No pericardial or pleural effusion.  Hepatobiliary: Previously seen left hepatic lobe hemangioma is poorly visualized. Progressive intrahepatic and extrahepatic biliary ductal dilatation. Common bile duct measures up to 1.7 cm, with fairly abrupt cut off in the pancreatic head (series 2, image 23 and coronal image 45). A common bile duct stent is in place, terminating just beyond the ampulla, as before. A faint low-attenuation lesion in the inferior right hepatic lobe measures approximately 8  mm (series 2, image 34), possibly stable. Gallbladder is unremarkable.  Pancreas: Heterogeneous mass in the pancreatic body measures 1.7 x 2.2 cm, stable. There is distal gland atrophy and mild ductal dilatation, measuring up to 4 mm, as before. A heterogeneous area previously measured in the uncinate process of the pancreas is not as readily evident on the current exam.  Spleen: Negative.  Adrenals/Urinary Tract: Adrenal glands and kidneys are unremarkable. Visualized portions of the ureters are decompressed.  Stomach/Bowel: Stomach and visualized portions of the small bowel and colon are grossly unremarkable.  Vascular/Lymphatic: Atherosclerotic calcification of the arterial vasculature without abdominal aortic aneurysm. Hepatic duodenal lymph node measures 2.2 x 4.0 cm (series 2, image 21), previously 1.5 x 3.0 cm. Retroperitoneal aortocaval lymph nodes measure up to 7 mm and appear slightly more prominent. Gastrohepatic ligament lymph nodes are not enlarged by CT size criteria. Small bowel mesenteric lymph node measures 11 mm (series 2, image 29), previously 9 mm. Peripancreatic lymph node measures 10 mm (series 2, image 20), previously 8 mm.  Other: No free fluid. Omental nodules measure  up to 5 mm, likely stable. Tiny periumbilical hernia contains fat.  Musculoskeletal: A sclerotic lesion in the T10 vertebral body is unchanged. Prominent Schmorl's node and mild compression involving the superior endplate of the L1 vertebral body appear unchanged.  IMPRESSION: 1. Progressive intrahepatic and extrahepatic biliary ductal dilatation with abrupt tapering in the pancreatic head. No obvious pancreatic head mass. 2. Pancreatic body mass is stable. Lesion previously seen in the uncinate process is not as well visualized. 3. Progressive peripancreatic, hepatoduodenal and mesenteric adenopathy. 4. Stable omental nodularity. 5. Image quality in the lung bases is degraded by respiratory motion. Nodular lesions in the lung  bases are grossly stable and by history, are metastatic. 6. Stable T10 metastasis.   Electronically Signed   By: Lorin Picket M.D.   On: 04/19/2015 11:46   Dg Ercp  04/21/2015   CLINICAL DATA:  Biliary stent obstruction, ERCP  EXAM: ERCP with stent replacement  TECHNIQUE: Multiple spot images obtained with the fluoroscopic device and submitted for interpretation post-procedure.  FLUOROSCOPY TIME:  Fluoroscopy Time:  1 minutes 26 seconds  COMPARISON:  CT 04/19/2015  FINDINGS: Two intraprocedural fluoroscopic images are provided, 1 of which demonstrates proximal common duct dilation. Contrast is noted within the duodenum. There is focal cut off at the level of the mid common duct. Image quality is degraded by motion and no filling defect is identified.  IMPRESSION: Common duct dilatation with suboptimal visualization but focal cut off is seen at the mid duct. Correlate with findings at real time imaging.  These images were submitted for radiologic interpretation only. Please see the procedural report for the amount of contrast and the fluoroscopy time utilized.   Electronically Signed   By: Conchita Paris M.D.   On: 04/21/2015 09:34    Discharge Laboratory Values:  CBC  Recent Labs Lab 04/19/15 0804 04/20/15 0706 04/21/15 0530 04/22/15 0425  WBC 17.8* 17.7* 14.0* 15.3*  HGB 11.7 10.3* 10.0* 10.0*  HCT 32.9* 31.3* 31.5* 30.3*  PLT 569* 638* 270 539*  MCV 68* 72.1* 72.7* 71.0*  MCH 24.2* 23.7* 23.1* 23.4*  MCHC 35.6 32.9 31.7 33.0  RDW 18.5* 18.0* 18.0* 18.1*  LYMPHSABS 3.7* 3.3 16.8* 3.0  MONOABS  --  1.0 8.3* 1.5*  EOSABS 0.3 0.3 1.1* 0.0  BASOSABS 0.1 0.1 0.3* 0.0     Chemistries   Recent Labs Lab 04/19/15 0804 04/20/15 0706 04/21/15 0530 04/22/15 0425  NA 131 136 135 135  K 4.1 3.3* 3.7 3.7  CL 100 107 105 105  CO2 21 19* 16* 21*  GLUCOSE 157* 117* 140* 262*  BUN 9 7 5* 13  CREATININE 0.9  ICT  0.36* 0.41* 0.65  CALCIUM 10.2 8.8* 8.9 9.3     Coagulation  profile  Recent Labs Lab 04/20/15 0706  INR 1.16    Signed: Sharene Butters, PA-C  ADDENDUM:  I review above. She had a very nice response and improvement with her bilirubin to the new stent. I appreciate gastroenterology's help.  I think she will respond well to radiation with Xeloda. I think she'll start radiation next week.  Her Xeloda dose will be 1000 mg twice a day.  I want to see her back in the office in a week or so.  I think the CA 19-9  will give [place] a good idea as to how she is doing.  On her exam upon discharge, her abdomen is soft. She has good bowel sounds. No obvious abdominal masses noted. There is no palpable  liver or spleen tip. Cardiac exam is regular rate and rhythm. Lungs are clear. Extremities shows no clubbing, cyanosis or edema.  Her blood sugar is on the high side. She has had issues with this. She is managing this with oral medications.  Pete E  04/22/2015, 9:45 AM

## 2015-04-22 NOTE — Telephone Encounter (Signed)
Spoke with pt regarding 7/5 appt for hospital f/u

## 2015-04-23 ENCOUNTER — Telehealth: Payer: Self-pay | Admitting: Radiation Oncology

## 2015-04-23 NOTE — Telephone Encounter (Signed)
Phoned patient. Confirmed appointment for port and treat on Monday, June 27th at 1435. Patient verbalized she plans to present for appointment. She denies needs at this time but, reports she is sore. Patient denies receiving Xeloda as of yet. Patient will inform this RN if she hasn't received her Xeloda by her start date. This RN's contact number was given to the patient.

## 2015-04-24 DIAGNOSIS — Z510 Encounter for antineoplastic radiation therapy: Secondary | ICD-10-CM | POA: Diagnosis present

## 2015-04-24 DIAGNOSIS — C259 Malignant neoplasm of pancreas, unspecified: Secondary | ICD-10-CM | POA: Diagnosis not present

## 2015-04-24 DIAGNOSIS — K831 Obstruction of bile duct: Secondary | ICD-10-CM | POA: Diagnosis not present

## 2015-04-24 DIAGNOSIS — C7800 Secondary malignant neoplasm of unspecified lung: Secondary | ICD-10-CM | POA: Diagnosis not present

## 2015-04-24 DIAGNOSIS — F458 Other somatoform disorders: Secondary | ICD-10-CM | POA: Diagnosis not present

## 2015-04-24 LAB — GLUCOSE, CAPILLARY: Glucose-Capillary: 145 mg/dL — ABNORMAL HIGH (ref 65–99)

## 2015-04-29 ENCOUNTER — Ambulatory Visit
Admit: 2015-04-29 | Discharge: 2015-04-29 | Disposition: A | Discharge status: Home / Self Care | Payer: Medicare Other | Attending: Radiation Oncology | Admitting: Radiation Oncology

## 2015-04-29 ENCOUNTER — Telehealth: Payer: Self-pay | Admitting: Radiation Oncology

## 2015-04-29 DIAGNOSIS — Z510 Encounter for antineoplastic radiation therapy: Secondary | ICD-10-CM | POA: Diagnosis not present

## 2015-04-29 NOTE — Telephone Encounter (Signed)
Placed orange folder with complete [employer] VERIFICATION FORM in Dr. Johny Shears inbox to sign.

## 2015-04-30 ENCOUNTER — Ambulatory Visit
Admit: 2015-04-30 | Discharge: 2015-04-30 | Disposition: A | Discharge status: Home / Self Care | Payer: Medicare Other | Attending: Radiation Oncology | Admitting: Radiation Oncology

## 2015-04-30 DIAGNOSIS — Z510 Encounter for antineoplastic radiation therapy: Secondary | ICD-10-CM | POA: Diagnosis not present

## 2015-05-01 ENCOUNTER — Ambulatory Visit
Admission: RE | Admit: 2015-05-01 | Discharge: 2015-05-01 | Disposition: A | Discharge status: Home / Self Care | Payer: Medicare Other | Source: Ambulatory Visit | Attending: Radiation Oncology | Admitting: Radiation Oncology

## 2015-05-01 ENCOUNTER — Ambulatory Visit
Admit: 2015-05-01 | Discharge: 2015-05-01 | Disposition: A | Discharge status: Home / Self Care | Payer: Medicare Other | Attending: Radiation Oncology | Admitting: Radiation Oncology

## 2015-05-01 ENCOUNTER — Telehealth: Payer: Self-pay | Admitting: Radiation Oncology

## 2015-05-01 ENCOUNTER — Encounter: Payer: Self-pay | Admitting: Radiation Oncology

## 2015-05-01 VITALS — BP 143/86 | HR 127 | Resp 16 | Wt 130.9 lb

## 2015-05-01 DIAGNOSIS — Z510 Encounter for antineoplastic radiation therapy: Secondary | ICD-10-CM | POA: Diagnosis not present

## 2015-05-01 DIAGNOSIS — C259 Malignant neoplasm of pancreas, unspecified: Secondary | ICD-10-CM

## 2015-05-01 DIAGNOSIS — C7800 Secondary malignant neoplasm of unspecified lung: Principal | ICD-10-CM

## 2015-05-01 NOTE — Telephone Encounter (Signed)
Placed completed and signed [place] in [employer].

## 2015-05-01 NOTE — Progress Notes (Signed)
Radiation Oncology         [phone] ________________________________  Name: Rache Klimaszewski  MRN: [id number]  Date: 05/01/2015  DOB: 1946/05/03  Follow-Up Visit Note  CC: Annye Asa, MD  [name], MD  Diagnosis: Arriana Lohmann is a 69 year old woman presenting to clinic in regards to her lymphadenopathy from pancreatic cancer causing biliary obstruction.  Current Dose: 4.5 Gy  Planned Dose: 45 Gy  Narrative: The patient returns today for routine follow-up.  Her weight is stable, blood pressure slightly elevated and denies pain. She reports constipation for the past two days. The patient denies nausea or vomiting but, has been experiencing increased reflux with a "nervous stomach". As a result, Lessig expressed that she has been expiercing a decreased appetite. Daquila is taking Xeloda as directed with radiation, but cannot take Megace because "it runs her sugar up." If she does self administer Megace, she "waters it down." Colson also stated, "I have this catch under my rib cage." A healthy mental disposition was expressed. The patient stated that she does not currently have a source of employment.                  ALLERGIES:  has No Known Allergies.  Meds: Current Outpatient Prescriptions  Medication Sig Dispense Refill  . amLODipine (NORVASC) 10 MG tablet Take 1 tablet (10 mg total) by mouth daily. 30 tablet 3  . capecitabine (XELODA) 500 MG tablet Take 2 tablets twice a day throughout all of the radiation. 100 tablet 0  . glipiZIDE-metformin (METAGLIP) 5-500 MG per tablet Take 1 tablet by mouth 2 (two) times daily before a meal. 60 tablet 4  . l-methylfolate-B6-B12 (METANX) 3-35-2 MG TABS Take 1 tablet by mouth daily. 30 tablet 3  . lidocaine-prilocaine (EMLA) cream Apply 1 application topically as needed. Apply quarter sized amount to portacath site at least one hour prior to chemo treatment 30 g 0  . Multiple Vitamin (MULTIVITAMIN WITH MINERALS) TABS tablet Take 1 tablet by mouth  daily.    . Pyridoxine HCl (VITAMIN B-6) 250 MG tablet Take 1 tablet (250 mg total) by mouth daily. (Patient taking differently: Take 1,250 mg by mouth daily. Taking 1250 mg (5 tablets) daily.) 30 tablet 6  . Calcium Carbonate-Vitamin D (CALCIUM + D PO) Take 1 tablet by mouth daily.    . Flaxseed, Linseed, (FLAX PO) Take 1 tablet by mouth daily.    . lansoprazole (PREVACID) 15 MG capsule Take 15 mg by mouth daily as needed (for heartburn).     . megestrol (MEGACE) 400 MG/10ML suspension Take 800 mg by mouth daily as needed (only uses when stomach is upset or feels stomach "needs" it).      No current facility-administered medications for this encounter.    Physical Findings: The patient is in no acute distress. Patient is alert and oriented X3.  weight is 130 lb 14.4 oz (59.376 kg). Her blood pressure is 143/86 and her pulse is 127. Her respiration is 16. .  No significant changes.  Lab Findings: Lab Results  Component Value Date   WBC 15.3* 04/22/2015   WBC 17.8* 04/19/2015   HGB 10.0* 04/22/2015   HGB 11.7 04/19/2015   HCT 30.3* 04/22/2015   HCT 32.9* 04/19/2015   PLT 539* 04/22/2015   PLT 569* 04/19/2015    Lab Results  Component Value Date   NA 135 04/22/2015   NA 131 04/19/2015   K 3.7 04/22/2015   K 4.1 04/19/2015  CO2 21* 04/22/2015   CO2 21 04/19/2015   GLUCOSE 262* 04/22/2015   GLUCOSE 157* 04/19/2015   BUN 13 04/22/2015   BUN 9 04/19/2015   CREATININE 0.65 04/22/2015   CREATININE 0.9  ICT  04/19/2015   BILITOT 3.6* 04/22/2015   BILITOT 8.00* 04/19/2015   ALKPHOS 757* 04/22/2015   ALKPHOS 947* 04/19/2015   AST 100* 04/22/2015   AST 220* 04/19/2015   ALT 239* 04/22/2015   ALT 370* 04/19/2015   PROT 6.9 04/22/2015   PROT 7.0 04/19/2015   ALBUMIN 2.6* 04/22/2015   CALCIUM 9.3 04/22/2015   CALCIUM 10.2 04/19/2015   ANIONGAP 9 04/22/2015    Radiographic Findings: Ct Abdomen W Contrast  04/19/2015   CLINICAL DATA:  Pancreatic cancer with lung metastases.   EXAM: CT ABDOMEN WITH CONTRAST  TECHNIQUE: Multidetector CT imaging of the abdomen was performed using the standard protocol following bolus administration of intravenous contrast.  CONTRAST:  137mL OMNIPAQUE IOHEXOL 300 MG/ML  SOLN  COMPARISON:  03/07/2015.  FINDINGS: Lower chest: Image quality is degraded in the lung bases due to respiratory motion. Irregular nodular densities are again seen in the lower lobes, difficult to measure due to morphology and respiratory motion. Heart size normal. No pericardial or pleural effusion.  Hepatobiliary: Previously seen left hepatic lobe hemangioma is poorly visualized. Progressive intrahepatic and extrahepatic biliary ductal dilatation. Common bile duct measures up to 1.7 cm, with fairly abrupt cut off in the pancreatic head (series 2, image 23 and coronal image 45). A common bile duct stent is in place, terminating just beyond the ampulla, as before. A faint low-attenuation lesion in the inferior right hepatic lobe measures approximately 8 mm (series 2, image 34), possibly stable. Gallbladder is unremarkable.  Pancreas: Heterogeneous mass in the pancreatic body measures 1.7 x 2.2 cm, stable. There is distal gland atrophy and mild ductal dilatation, measuring up to 4 mm, as before. A heterogeneous area previously measured in the uncinate process of the pancreas is not as readily evident on the current exam.  Spleen: Negative.  Adrenals/Urinary Tract: Adrenal glands and kidneys are unremarkable. Visualized portions of the ureters are decompressed.  Stomach/Bowel: Stomach and visualized portions of the small bowel and colon are grossly unremarkable.  Vascular/Lymphatic: Atherosclerotic calcification of the arterial vasculature without abdominal aortic aneurysm. Hepatic duodenal lymph node measures 2.2 x 4.0 cm (series 2, image 21), previously 1.5 x 3.0 cm. Retroperitoneal aortocaval lymph nodes measure up to 7 mm and appear slightly more prominent. Gastrohepatic ligament  lymph nodes are not enlarged by CT size criteria. Small bowel mesenteric lymph node measures 11 mm (series 2, image 29), previously 9 mm. Peripancreatic lymph node measures 10 mm (series 2, image 20), previously 8 mm.  Other: No free fluid. Omental nodules measure up to 5 mm, likely stable. Tiny periumbilical hernia contains fat.  Musculoskeletal: A sclerotic lesion in the T10 vertebral body is unchanged. Prominent Schmorl's node and mild compression involving the superior endplate of the L1 vertebral body appear unchanged.  IMPRESSION: 1. Progressive intrahepatic and extrahepatic biliary ductal dilatation with abrupt tapering in the pancreatic head. No obvious pancreatic head mass. 2. Pancreatic body mass is stable. Lesion previously seen in the uncinate process is not as well visualized. 3. Progressive peripancreatic, hepatoduodenal and mesenteric adenopathy. 4. Stable omental nodularity. 5. Image quality in the lung bases is degraded by respiratory motion. Nodular lesions in the lung bases are grossly stable and by history, are metastatic. 6. Stable T10 metastasis.   Electronically Signed  By: Lorin Picket M.D.   On: 04/19/2015 11:46   Dg Ercp  04/21/2015   CLINICAL DATA:  Biliary stent obstruction, ERCP  EXAM: ERCP with stent replacement  TECHNIQUE: Multiple spot images obtained with the fluoroscopic device and submitted for interpretation post-procedure.  FLUOROSCOPY TIME:  Fluoroscopy Time:  1 minutes 26 seconds  COMPARISON:  CT 04/19/2015  FINDINGS: Two intraprocedural fluoroscopic images are provided, 1 of which demonstrates proximal common duct dilation. Contrast is noted within the duodenum. There is focal cut off at the level of the mid common duct. Image quality is degraded by motion and no filling defect is identified.  IMPRESSION: Common duct dilatation with suboptimal visualization but focal cut off is seen at the mid duct. Correlate with findings at real time imaging.  These images were  submitted for radiologic interpretation only. Please see the procedural report for the amount of contrast and the fluoroscopy time utilized.   Electronically Signed   By: Conchita Paris M.D.   On: 04/21/2015 09:34    Impression: Shawni Volkov is a 69 year old woman presenting to clinic in regards to her lymphadenopathy from pancreatic cancer. The patient is tolerating radiation.  Plan: We discussed options in managing the new side effects most likely caused by her current prescribed medication, which included loss of appetite and constipation. Treatment will continue as planned. If Haglund as any further questions or concerns, she is encouraged to contact us.   This document serves as a record of services personally performed by Tyler Pita, MD. It was created on his behalf by Lenn Cal, a trained medical scribe. The creation of this record is based on the scribe's personal observations and the provider's statements to them. This document has been checked and approved by the attending provider.  _____________________________________  Sheral Apley. Tammi Klippel, M.D.

## 2015-05-01 NOTE — Progress Notes (Addendum)
Weight stable. BP slightly elevated. Denies pain. Reports constipated x 2 days. Reports taking Xeloda as directed with radiation. Denies nausea or vomiting but, does reports a "nervous stomach." Reports decreased appetite. Explains she is unable to take megace because "it runs her sugar up." reports increased reflux.  BP 143/86 mmHg  Pulse 127  Resp 16  Wt 130 lb 14.4 oz (59.376 kg) Wt Readings from Last 3 Encounters:  05/01/15 130 lb 14.4 oz (59.376 kg)  04/22/15 144 lb (65.318 kg)  04/19/15 131 lb 8 oz (59.648 kg)   Oriented patient to staff and routine of the clinic. Provided patient with RADIATION THERAPY AND YOU handbook then, reviewed pertinent information. Educated patient reference potential side effects and management such as, fatigue, skin irritation, vomiting, dehydration, diarrhea, nausea, and weight loss. Provided patient with radiaplex and directed upon use. Patient verbalized understanding of all reviewed.

## 2015-05-02 ENCOUNTER — Ambulatory Visit
Admit: 2015-05-02 | Discharge: 2015-05-02 | Disposition: A | Discharge status: Home / Self Care | Payer: Medicare Other | Attending: Radiation Oncology | Admitting: Radiation Oncology

## 2015-05-02 DIAGNOSIS — Z510 Encounter for antineoplastic radiation therapy: Secondary | ICD-10-CM | POA: Diagnosis not present

## 2015-05-03 ENCOUNTER — Ambulatory Visit
Admit: 2015-05-03 | Discharge: 2015-05-03 | Disposition: A | Discharge status: Home / Self Care | Payer: Medicare Other | Attending: Radiation Oncology | Admitting: Radiation Oncology

## 2015-05-03 DIAGNOSIS — Z510 Encounter for antineoplastic radiation therapy: Secondary | ICD-10-CM | POA: Diagnosis not present

## 2015-05-07 ENCOUNTER — Other Ambulatory Visit (HOSPITAL_BASED_OUTPATIENT_CLINIC_OR_DEPARTMENT_OTHER): Payer: Medicare Other

## 2015-05-07 ENCOUNTER — Ambulatory Visit (HOSPITAL_BASED_OUTPATIENT_CLINIC_OR_DEPARTMENT_OTHER): Payer: Medicare Other | Admitting: Family

## 2015-05-07 ENCOUNTER — Other Ambulatory Visit: Payer: Self-pay | Admitting: Hematology & Oncology

## 2015-05-07 ENCOUNTER — Encounter: Payer: Self-pay | Admitting: Family

## 2015-05-07 ENCOUNTER — Ambulatory Visit
Admit: 2015-05-07 | Discharge: 2015-05-07 | Disposition: A | Discharge status: Home / Self Care | Payer: Medicare Other | Attending: Radiation Oncology | Admitting: Radiation Oncology

## 2015-05-07 VITALS — BP 150/74 | HR 107 | Temp 98.2°F | Resp 16 | Ht 60.0 in | Wt 132.0 lb

## 2015-05-07 DIAGNOSIS — C7800 Secondary malignant neoplasm of unspecified lung: Secondary | ICD-10-CM

## 2015-05-07 DIAGNOSIS — C259 Malignant neoplasm of pancreas, unspecified: Secondary | ICD-10-CM

## 2015-05-07 DIAGNOSIS — Z510 Encounter for antineoplastic radiation therapy: Secondary | ICD-10-CM | POA: Diagnosis not present

## 2015-05-07 LAB — CMP (CANCER CENTER ONLY)
ALBUMIN: 3.4 g/dL (ref 3.3–5.5)
ALT(SGPT): 52 U/L — ABNORMAL HIGH (ref 10–47)
AST: 41 U/L — AB (ref 11–38)
Alkaline Phosphatase: 211 U/L — ABNORMAL HIGH (ref 26–84)
BUN: 13 mg/dL (ref 7–22)
CALCIUM: 10 mg/dL (ref 8.0–10.3)
CHLORIDE: 104 meq/L (ref 98–108)
CO2: 21 meq/L (ref 18–33)
CREATININE: 0.8 mg/dL (ref 0.6–1.2)
Glucose, Bld: 216 mg/dL — ABNORMAL HIGH (ref 73–118)
POTASSIUM: 3.9 meq/L (ref 3.3–4.7)
Sodium: 143 mEq/L (ref 128–145)
Total Bilirubin: 1.5 mg/dl (ref 0.20–1.60)
Total Protein: 8 g/dL (ref 6.4–8.1)

## 2015-05-07 LAB — CBC WITH DIFFERENTIAL (CANCER CENTER ONLY)
BASO#: 0 10*3/uL (ref 0.0–0.2)
BASO%: 0.5 % (ref 0.0–2.0)
EOS ABS: 0.2 10*3/uL (ref 0.0–0.5)
EOS%: 2.3 % (ref 0.0–7.0)
HCT: 35.3 % (ref 34.8–46.6)
HEMOGLOBIN: 11.6 g/dL (ref 11.6–15.9)
LYMPH#: 2.4 10*3/uL (ref 0.9–3.3)
LYMPH%: 29.7 % (ref 14.0–48.0)
MCH: 24.2 pg — ABNORMAL LOW (ref 26.0–34.0)
MCHC: 32.9 g/dL (ref 32.0–36.0)
MCV: 74 fL — AB (ref 81–101)
MONO#: 0.9 10*3/uL (ref 0.1–0.9)
MONO%: 11.5 % (ref 0.0–13.0)
NEUT#: 4.6 10*3/uL (ref 1.5–6.5)
NEUT%: 56 % (ref 39.6–80.0)
Platelets: 448 10*3/uL — ABNORMAL HIGH (ref 145–400)
RBC: 4.79 10*6/uL (ref 3.70–5.32)
RDW: 18.1 % — AB (ref 11.1–15.7)
WBC: 8.2 10*3/uL (ref 3.9–10.0)

## 2015-05-07 MED ORDER — RADIAPLEXRX EX GEL
Freq: Once | CUTANEOUS | Status: AC
  Administered 2015-05-07: 11:00:00 via TOPICAL

## 2015-05-07 NOTE — Addendum Note (Signed)
Encounter addended by: Heywood Footman, RN on: 05/07/2015 10:46 AM<BR>     Documentation filed: Inpatient MAR

## 2015-05-07 NOTE — Progress Notes (Signed)
Hematology and Oncology Follow Up Visit  Jacqueline Hahn [id number] 01-04-1946 69 y.o. 05/07/2015   Principle Diagnosis:  Metastatic adenocarcinoma of the pancreas  Current Therapy:   Xeloda 1,000 mg BID Radiation s/p 5 of 25 fractions    Interim History: Jacqueline Hahn is here today for Cycle 7 of treatment. She is feeling much better since having her bilary stent replaced. She is no longer jaundiced. Her bilirubin is back to normal and her LFT's are coming back down nicely.  She taking Xeloda twice daily and has not experienced any side effects.  She has completed 5 of 25 radiation treatments. She has had some fatigue but found that taking B 12 daily has helped with this.  There has been no problem with infections. No fever, chills, n/v, cough, rash, headache, dizziness, SOB, chest pain, palpitations, abdominal pain, diarrhea, blood in urine or stool. She is still having constipation and will start taking Miralax BID to see if that will help.  No episodes of bleeding or bruising.  No swelling, tenderness, numbness or tingling in her extremities. No new aches or pains. Her appetite is good and she is staying hydrated. She has gained back 2 lbs.  She is still walking and doing her best to stay active.   Medications:    Medication List       This list is accurate as of: 05/07/15 11:27 AM.  Always use your most recent med list.               amLODipine 10 MG tablet  Commonly known as:  NORVASC  TAKE 1 TABLET (10 MG TOTAL) BY MOUTH DAILY.     CALCIUM + D PO  Take 1 tablet by mouth daily.     capecitabine 500 MG tablet  Commonly known as:  XELODA  Take 2 tablets twice a day throughout all of the radiation.     FLAX PO  Take 1 tablet by mouth daily.     glipiZIDE-metformin 5-500 MG per tablet  Commonly known as:  METAGLIP  Take 1 tablet by mouth 2 (two) times daily before a meal.     l-methylfolate-B6-B12 3-35-2 MG Tabs  Commonly known as:  METANX  Take 1 tablet by mouth daily.       lansoprazole 15 MG capsule  Commonly known as:  PREVACID  Take 15 mg by mouth daily as needed (for heartburn).     lidocaine-prilocaine cream  Commonly known as:  EMLA  Apply 1 application topically as needed. Apply quarter sized amount to portacath site at least one hour prior to chemo treatment     megestrol 400 MG/10ML suspension  Commonly known as:  MEGACE  Take 800 mg by mouth daily as needed (only uses when stomach is upset or feels stomach "needs" it).     multivitamin with minerals Tabs tablet  Take 1 tablet by mouth daily.     vitamin B-6 250 MG tablet  Take 1 tablet (250 mg total) by mouth daily.        Allergies: No Known Allergies  Past Medical History, Surgical history, Social history, and Family History were reviewed and updated.  Review of Systems: All other 10 point review of systems is negative.   Physical Exam:  height is 5' (1.524 m) and weight is 132 lb (59.875 kg). Her oral temperature is 98.2 F (36.8 C). Her blood pressure is 150/74 and her pulse is 107. Her respiration is 16.   Wt Readings from Last 3 Encounters:  05/07/15 132 lb (59.875 kg)  05/01/15 130 lb 14.4 oz (59.376 kg)  04/22/15 144 lb (65.318 kg)    Ocular: Sclerae unicteric, pupils equal, round and reactive to light Ear-nose-throat: Oropharynx clear, dentition fair Lymphatic: No cervical or supraclavicular adenopathy Lungs no rales or rhonchi, good excursion bilaterally Heart regular rate and rhythm, no murmur appreciated Abd soft, nontender, positive bowel sounds MSK no focal spinal tenderness, no joint edema Neuro: non-focal, well-oriented, appropriate affect Breasts: Deferred  Lab Results  Component Value Date   WBC 8.2 05/07/2015   HGB 11.6 05/07/2015   HCT 35.3 05/07/2015   MCV 74* 05/07/2015   PLT 448* 05/07/2015   Lab Results  Component Value Date   FERRITIN 205 06/25/2014   IRON 44 06/25/2014   TIBC 301 06/25/2014   UIBC 257 06/25/2014   IRONPCTSAT 15*  06/25/2014   Lab Results  Component Value Date   RBC 4.79 05/07/2015   No results found for: KPAFRELGTCHN, LAMBDASER, KAPLAMBRATIO No results found for: IGGSERUM, IGA, IGMSERUM No results found for: Odetta Pink, SPEI   Chemistry      Component Value Date/Time   NA 135 04/22/2015 0425   NA 131 04/19/2015 0804   K 3.7 04/22/2015 0425   K 4.1 04/19/2015 0804   CL 105 04/22/2015 0425   CL 100 04/19/2015 0804   CO2 21* 04/22/2015 0425   CO2 21 04/19/2015 0804   BUN 13 04/22/2015 0425   BUN 9 04/19/2015 0804   CREATININE 0.65 04/22/2015 0425   CREATININE 0.9  ICT  04/19/2015 0804      Component Value Date/Time   CALCIUM 9.3 04/22/2015 0425   CALCIUM 10.2 04/19/2015 0804   ALKPHOS 757* 04/22/2015 0425   ALKPHOS 947* 04/19/2015 0804   AST 100* 04/22/2015 0425   AST 220* 04/19/2015 0804   ALT 239* 04/22/2015 0425   ALT 370* 04/19/2015 0804   BILITOT 3.6* 04/22/2015 0425   BILITOT 8.00* 04/19/2015 0804     Impression and Plan: Jacqueline Hahn is 69 year old female with metastatic adenocarcinoma of pancreas. She is feeling much better since having her biliary stent replaced.  Her bilirubin is now back to normal and her LFTs continue to improve.  She will continue taking her Xeloda PO BID.  She has had some fatigue with radiation but is managing this with vitamin B 12. She has completed 5 out of 25 fractions.  We will plan to see her back in 3 weeks for labs and follow-up.  She knows to call here with any questions or concerns. We can certainly see her sooner if need be.   Eliezer Bottom, NP 7/5/201611:27 AM

## 2015-05-07 NOTE — Addendum Note (Signed)
Encounter addended by: Heywood Footman, RN on: 05/07/2015 10:45 AM<BR>     Documentation filed: Notes Section, Inpatient Patient Education, Visit Diagnoses, Dx Association, Orders

## 2015-05-08 ENCOUNTER — Ambulatory Visit
Admit: 2015-05-08 | Discharge: 2015-05-08 | Disposition: A | Discharge status: Home / Self Care | Payer: Medicare Other | Attending: Radiation Oncology | Admitting: Radiation Oncology

## 2015-05-08 DIAGNOSIS — Z510 Encounter for antineoplastic radiation therapy: Secondary | ICD-10-CM | POA: Diagnosis not present

## 2015-05-08 LAB — CANCER ANTIGEN 19-9: CA 19-9: 14426.1 U/mL — ABNORMAL HIGH (ref <35.0)

## 2015-05-08 LAB — PREALBUMIN: PREALBUMIN: 30 mg/dL (ref 17–34)

## 2015-05-09 ENCOUNTER — Ambulatory Visit
Admit: 2015-05-09 | Discharge: 2015-05-09 | Disposition: A | Discharge status: Home / Self Care | Payer: Medicare Other | Attending: Radiation Oncology | Admitting: Radiation Oncology

## 2015-05-09 DIAGNOSIS — Z510 Encounter for antineoplastic radiation therapy: Secondary | ICD-10-CM | POA: Diagnosis not present

## 2015-05-10 ENCOUNTER — Encounter: Payer: Self-pay | Admitting: Radiation Oncology

## 2015-05-10 ENCOUNTER — Ambulatory Visit
Admission: RE | Admit: 2015-05-10 | Discharge: 2015-05-10 | Disposition: A | Discharge status: Home / Self Care | Payer: Medicare Other | Source: Ambulatory Visit | Attending: Radiation Oncology | Admitting: Radiation Oncology

## 2015-05-10 ENCOUNTER — Ambulatory Visit
Admit: 2015-05-10 | Discharge: 2015-05-10 | Disposition: A | Discharge status: Home / Self Care | Payer: Medicare Other | Attending: Radiation Oncology | Admitting: Radiation Oncology

## 2015-05-10 VITALS — BP 124/80 | HR 117 | Resp 16 | Wt 130.0 lb

## 2015-05-10 DIAGNOSIS — Z510 Encounter for antineoplastic radiation therapy: Secondary | ICD-10-CM | POA: Diagnosis not present

## 2015-05-10 DIAGNOSIS — C259 Malignant neoplasm of pancreas, unspecified: Secondary | ICD-10-CM

## 2015-05-10 NOTE — Progress Notes (Signed)
  Radiation Oncology         [id number]   Name: Jacqueline Hahn MRN: [id number]   Date: 05/10/2015  DOB: July 13, 1946   Weekly Radiation Therapy Management    ICD-9-CM ICD-10-CM   1. Pancreas carcinoma 157.9 C25.9     Current Dose: 16.2 Gy  Planned Dose:  45 Gy  Narrative The patient presents for routine under treatment assessment. Weight and vitals stable. Reports taking xeloda bid as directed with 2 in the morning and 2 at night. Reports abdominal cramping after taking the xeloda and brief nausea that quickly resolves. Reports chronic constipation but, that she did have a bowel movement yesterday. The patient is without additional complaints. Set-up films were reviewed. The chart was checked.  Physical Findings  weight is 130 lb (58.968 kg). Her blood pressure is 124/80 and her pulse is 117. Her respiration is 16. . Weight essentially stable.  No significant changes.  Impression The patient is tolerating radiation.  Plan Continue treatment as planned.    This document serves as a record of services personally performed by Tyler Pita, MD. It was created on his behalf by Darcus Austin, a trained medical scribe. The creation of this record is based on the scribe's personal observations and the provider's statements to them. This document has been checked and approved by the attending provider.      Sheral Apley Tammi Klippel, M.D.

## 2015-05-10 NOTE — Progress Notes (Addendum)
Weight and vitals stable. Reports taking xeloda bid as directed. Question if she has to take all four tablet in a 24 hour period. Reports abdominal cramping after taking the xeloda and brief nausea that quickly resolves. Reports chronic constipation but, that she did have a bowel movement yesterday.  BP 124/80 mmHg  Pulse 117  Resp 16  Wt 130 lb (58.968 kg)  Wt Readings from Last 3 Encounters:  05/10/15 130 lb (58.968 kg)  05/07/15 132 lb (59.875 kg)  05/01/15 130 lb 14.4 oz (59.376 kg)

## 2015-05-13 ENCOUNTER — Ambulatory Visit
Admit: 2015-05-13 | Discharge: 2015-05-13 | Disposition: A | Discharge status: Home / Self Care | Payer: Medicare Other | Attending: Radiation Oncology | Admitting: Radiation Oncology

## 2015-05-13 DIAGNOSIS — Z510 Encounter for antineoplastic radiation therapy: Secondary | ICD-10-CM | POA: Diagnosis not present

## 2015-05-14 ENCOUNTER — Ambulatory Visit
Admission: RE | Admit: 2015-05-14 | Discharge: 2015-05-14 | Disposition: A | Discharge status: Home / Self Care | Payer: Medicare Other | Source: Ambulatory Visit | Attending: Radiation Oncology | Admitting: Radiation Oncology

## 2015-05-14 DIAGNOSIS — Z510 Encounter for antineoplastic radiation therapy: Secondary | ICD-10-CM | POA: Diagnosis not present

## 2015-05-15 ENCOUNTER — Ambulatory Visit
Admission: RE | Admit: 2015-05-15 | Discharge: 2015-05-15 | Disposition: A | Discharge status: Home / Self Care | Payer: Medicare Other | Source: Ambulatory Visit | Attending: Radiation Oncology | Admitting: Radiation Oncology

## 2015-05-15 DIAGNOSIS — Z510 Encounter for antineoplastic radiation therapy: Secondary | ICD-10-CM | POA: Diagnosis not present

## 2015-05-16 ENCOUNTER — Ambulatory Visit
Admission: RE | Admit: 2015-05-16 | Discharge: 2015-05-16 | Disposition: A | Discharge status: Home / Self Care | Payer: Medicare Other | Source: Ambulatory Visit | Attending: Radiation Oncology | Admitting: Radiation Oncology

## 2015-05-16 DIAGNOSIS — Z510 Encounter for antineoplastic radiation therapy: Secondary | ICD-10-CM | POA: Diagnosis not present

## 2015-05-17 ENCOUNTER — Ambulatory Visit
Admission: RE | Admit: 2015-05-17 | Discharge: 2015-05-17 | Disposition: A | Discharge status: Home / Self Care | Payer: Medicare Other | Source: Ambulatory Visit | Attending: Radiation Oncology | Admitting: Radiation Oncology

## 2015-05-17 ENCOUNTER — Ambulatory Visit
Admit: 2015-05-17 | Discharge: 2015-05-17 | Disposition: A | Discharge status: Home / Self Care | Payer: Medicare Other | Attending: Radiation Oncology | Admitting: Radiation Oncology

## 2015-05-17 ENCOUNTER — Encounter: Payer: Self-pay | Admitting: Radiation Oncology

## 2015-05-17 VITALS — BP 135/82 | HR 115 | Resp 16 | Wt 132.7 lb

## 2015-05-17 DIAGNOSIS — C7800 Secondary malignant neoplasm of unspecified lung: Principal | ICD-10-CM

## 2015-05-17 DIAGNOSIS — Z510 Encounter for antineoplastic radiation therapy: Secondary | ICD-10-CM | POA: Diagnosis not present

## 2015-05-17 DIAGNOSIS — C259 Malignant neoplasm of pancreas, unspecified: Secondary | ICD-10-CM

## 2015-05-17 NOTE — Progress Notes (Signed)
Weight and vitals stable. Denies pain. Reports occasional constipation continues. Reports taking Xeloda daily. Denies nausea or vomiting. Reports nervous stomach resolved when po intake increased. Reports reflux is less.  BP 135/82 mmHg  Pulse 115  Resp 16  Wt 132 lb 11.2 oz (60.192 kg) Wt Readings from Last 3 Encounters:  05/17/15 132 lb 11.2 oz (60.192 kg)  05/10/15 130 lb (58.968 kg)  05/07/15 132 lb (59.875 kg)

## 2015-05-17 NOTE — Progress Notes (Signed)
  Radiation Oncology         [phone]   Name: Jacqueline Hahn MRN: [id number]   Date: 05/17/2015  DOB: 09-19-1946   Weekly Radiation Therapy Management    ICD-9-CM ICD-10-CM   1. Pancreatic cancer metastasized to lung 157.9 C25.9    197.0 C78.00     Current Dose: 25.2 Gy  Planned Dose:  45 Gy  Narrative The patient presents for routine under treatment assessment. Denies pain. Reports occasional constipation continues. Reports taking Xeloda daily. Denies nausea or vomiting. Reports nervous stomach resolved when po intake increased. Reports reflux is less.  The patient is without complaint. Set-up films were reviewed. The chart was checked.  Physical Findings  weight is 132 lb 11.2 oz (60.192 kg). Her blood pressure is 135/82 and her pulse is 115. Her respiration is 16. . Weight essentially stable.  No significant changes.  Impression The patient is tolerating radiation.  Plan Continue treatment as planned.      Sheral Apley Tammi Klippel, M.D.

## 2015-05-20 ENCOUNTER — Ambulatory Visit
Admission: RE | Admit: 2015-05-20 | Discharge: 2015-05-20 | Disposition: A | Discharge status: Home / Self Care | Payer: Medicare Other | Source: Ambulatory Visit | Attending: Radiation Oncology | Admitting: Radiation Oncology

## 2015-05-20 DIAGNOSIS — Z510 Encounter for antineoplastic radiation therapy: Secondary | ICD-10-CM | POA: Diagnosis not present

## 2015-05-21 ENCOUNTER — Ambulatory Visit
Admit: 2015-05-21 | Discharge: 2015-05-21 | Disposition: A | Discharge status: Home / Self Care | Payer: Medicare Other | Attending: Radiation Oncology | Admitting: Radiation Oncology

## 2015-05-21 DIAGNOSIS — Z510 Encounter for antineoplastic radiation therapy: Secondary | ICD-10-CM | POA: Diagnosis not present

## 2015-05-22 ENCOUNTER — Ambulatory Visit
Admit: 2015-05-22 | Discharge: 2015-05-22 | Disposition: A | Discharge status: Home / Self Care | Payer: Medicare Other | Attending: Radiation Oncology | Admitting: Radiation Oncology

## 2015-05-22 DIAGNOSIS — Z510 Encounter for antineoplastic radiation therapy: Secondary | ICD-10-CM | POA: Diagnosis not present

## 2015-05-23 ENCOUNTER — Ambulatory Visit
Admit: 2015-05-23 | Discharge: 2015-05-23 | Disposition: A | Discharge status: Home / Self Care | Payer: Medicare Other | Attending: Radiation Oncology | Admitting: Radiation Oncology

## 2015-05-23 DIAGNOSIS — Z510 Encounter for antineoplastic radiation therapy: Secondary | ICD-10-CM | POA: Diagnosis not present

## 2015-05-24 ENCOUNTER — Encounter: Payer: Self-pay | Admitting: Radiation Oncology

## 2015-05-24 ENCOUNTER — Ambulatory Visit
Admission: RE | Admit: 2015-05-24 | Discharge: 2015-05-24 | Disposition: A | Discharge status: Home / Self Care | Payer: Medicare Other | Source: Ambulatory Visit | Attending: Radiation Oncology | Admitting: Radiation Oncology

## 2015-05-24 ENCOUNTER — Ambulatory Visit
Admit: 2015-05-24 | Discharge: 2015-05-24 | Disposition: A | Discharge status: Home / Self Care | Payer: Medicare Other | Attending: Radiation Oncology | Admitting: Radiation Oncology

## 2015-05-24 VITALS — BP 116/55 | HR 115 | Resp 16 | Wt 133.3 lb

## 2015-05-24 DIAGNOSIS — Z510 Encounter for antineoplastic radiation therapy: Secondary | ICD-10-CM | POA: Diagnosis not present

## 2015-05-24 DIAGNOSIS — C259 Malignant neoplasm of pancreas, unspecified: Secondary | ICD-10-CM

## 2015-05-24 NOTE — Progress Notes (Signed)
Weight and vitals stable. Denies pain. Reports constipation continues despite Miralax, increased fluids intake, enema, and suppository. Reports taking Xeloda daily as directed. Denies nausea or vomiting. Reports fatigue.  BP 116/55 mmHg  Pulse 115  Resp 16  Wt 133 lb 4.8 oz (60.464 kg) Wt Readings from Last 3 Encounters:  05/24/15 133 lb 4.8 oz (60.464 kg)  05/17/15 132 lb 11.2 oz (60.192 kg)  05/10/15 130 lb (58.968 kg)

## 2015-05-24 NOTE — Progress Notes (Signed)
  Radiation Oncology         [phone]   Name: Jacqueline Hahn MRN: [id number]   Date: 05/24/2015  DOB: [birth year]     Weekly Radiation Therapy Management    ICD-9-CM ICD-10-CM   1. Pancreas carcinoma 157.9 C25.9     Current Dose: 34.2 Gy  Planned Dose:  45 Gy  Narrative The patient presents for routine under treatment assessment. Weight and vitals stable. Denies pain. Reports constipation continues despite Miralax, increased fluids intake, enema, and suppository. Reports taking Xeloda daily as directed. Denies nausea or vomiting. Reports fatigue. The patient is without complaint. Set-up films were reviewed. The chart was checked.  Physical Findings  weight is 133 lb 4.8 oz (60.464 kg). Her blood pressure is 116/55 and her pulse is 115. Her respiration is 16. . Weight essentially stable.  No significant changes.  Impression The patient is tolerating radiation.  Plan Continue treatment as planned.   This document serves as a record of services personally performed by Tyler Pita, MD. It was created on his behalf by Arlyce Harman, a trained medical scribe. The creation of this record is based on the scribe's personal observations and the provider's statements to them. This document has been checked and approved by the attending provider.      Sheral Apley Tammi Klippel, M.D.

## 2015-05-27 ENCOUNTER — Ambulatory Visit
Admit: 2015-05-27 | Discharge: 2015-05-27 | Disposition: A | Discharge status: Home / Self Care | Payer: Medicare Other | Attending: Radiation Oncology | Admitting: Radiation Oncology

## 2015-05-27 DIAGNOSIS — Z510 Encounter for antineoplastic radiation therapy: Secondary | ICD-10-CM | POA: Diagnosis not present

## 2015-05-28 ENCOUNTER — Ambulatory Visit
Admit: 2015-05-28 | Discharge: 2015-05-28 | Disposition: A | Discharge status: Home / Self Care | Payer: Medicare Other | Attending: Radiation Oncology | Admitting: Radiation Oncology

## 2015-05-28 ENCOUNTER — Other Ambulatory Visit (HOSPITAL_BASED_OUTPATIENT_CLINIC_OR_DEPARTMENT_OTHER): Payer: Medicare Other

## 2015-05-28 ENCOUNTER — Encounter: Payer: Self-pay | Admitting: Hematology & Oncology

## 2015-05-28 ENCOUNTER — Ambulatory Visit (HOSPITAL_BASED_OUTPATIENT_CLINIC_OR_DEPARTMENT_OTHER): Payer: Medicare Other | Admitting: Hematology & Oncology

## 2015-05-28 VITALS — BP 150/84 | HR 107 | Temp 97.7°F | Resp 18 | Ht 61.0 in | Wt 132.0 lb

## 2015-05-28 DIAGNOSIS — C259 Malignant neoplasm of pancreas, unspecified: Secondary | ICD-10-CM

## 2015-05-28 DIAGNOSIS — C7800 Secondary malignant neoplasm of unspecified lung: Principal | ICD-10-CM

## 2015-05-28 DIAGNOSIS — Z510 Encounter for antineoplastic radiation therapy: Secondary | ICD-10-CM | POA: Diagnosis not present

## 2015-05-28 LAB — COMPREHENSIVE METABOLIC PANEL
ALK PHOS: 97 U/L (ref 33–130)
ALT: 22 U/L (ref 6–29)
AST: 23 U/L (ref 10–35)
Albumin: 4 g/dL (ref 3.6–5.1)
BILIRUBIN TOTAL: 0.6 mg/dL (ref 0.2–1.2)
BUN: 18 mg/dL (ref 7–25)
CALCIUM: 9.8 mg/dL (ref 8.6–10.4)
CHLORIDE: 105 meq/L (ref 98–110)
CO2: 22 meq/L (ref 20–31)
CREATININE: 0.75 mg/dL (ref 0.50–0.99)
Glucose, Bld: 166 mg/dL — ABNORMAL HIGH (ref 65–99)
Potassium: 4.5 mEq/L (ref 3.5–5.3)
Sodium: 138 mEq/L (ref 135–146)
TOTAL PROTEIN: 7.1 g/dL (ref 6.1–8.1)

## 2015-05-28 LAB — CBC WITH DIFFERENTIAL (CANCER CENTER ONLY)
BASO#: 0 10*3/uL (ref 0.0–0.2)
BASO%: 0.4 % (ref 0.0–2.0)
EOS ABS: 0.3 10*3/uL (ref 0.0–0.5)
EOS%: 3.6 % (ref 0.0–7.0)
HCT: 38 % (ref 34.8–46.6)
HEMOGLOBIN: 12.7 g/dL (ref 11.6–15.9)
LYMPH#: 1.2 10*3/uL (ref 0.9–3.3)
LYMPH%: 15.9 % (ref 14.0–48.0)
MCH: 25 pg — AB (ref 26.0–34.0)
MCHC: 33.4 g/dL (ref 32.0–36.0)
MCV: 75 fL — ABNORMAL LOW (ref 81–101)
MONO#: 1 10*3/uL — AB (ref 0.1–0.9)
MONO%: 13.7 % — ABNORMAL HIGH (ref 0.0–13.0)
NEUT%: 66.4 % (ref 39.6–80.0)
NEUTROS ABS: 4.8 10*3/uL (ref 1.5–6.5)
Platelets: 244 10*3/uL (ref 145–400)
RBC: 5.07 10*6/uL (ref 3.70–5.32)
RDW: 18.2 % — ABNORMAL HIGH (ref 11.1–15.7)
WBC: 7.2 10*3/uL (ref 3.9–10.0)

## 2015-05-28 LAB — PREALBUMIN: Prealbumin: 35 mg/dL — ABNORMAL HIGH (ref 17–34)

## 2015-05-28 LAB — CANCER ANTIGEN 19-9: CA 19 9: 10039.6 U/mL — AB (ref <35.0)

## 2015-05-29 ENCOUNTER — Ambulatory Visit
Admit: 2015-05-29 | Discharge: 2015-05-29 | Disposition: A | Discharge status: Home / Self Care | Payer: Medicare Other | Attending: Radiation Oncology | Admitting: Radiation Oncology

## 2015-05-29 DIAGNOSIS — Z510 Encounter for antineoplastic radiation therapy: Secondary | ICD-10-CM | POA: Diagnosis not present

## 2015-05-29 NOTE — Progress Notes (Signed)
Hematology and Oncology Follow Up Visit  Jissel Slavens [id number] 11/01/1946 69 y.o. 05/29/2015   Principle Diagnosis:  Metastatic adenocarcinoma of the pancreas  Current Therapy:   Radiation therapy with [facility] finish on August 1    Interim History: Ms. Lacross is back for follow-up. She looks pretty good. She is having some tough time with the Xeloda. She has a lot of abdominal discomfort. This nausea but no vomiting.  I told her to go down to one pill twice a day with the Xeloda. She will finish up her radiation on August 1 so I told her to stop the Xeloda this Friday.  She's had no pain issues. She's had no diarrhea. She actually has some constipation.  There's not been any bleeding. Per she's had no fever. Per she's had no cough or shortness of breath.  Overall, her performance status is ECOG 1.  Medications:    Medication List       This list is accurate as of: 05/28/15 11:59 PM.  Always use your most recent med list.               amLODipine 10 MG tablet  Commonly known as:  NORVASC  TAKE 1 TABLET (10 MG TOTAL) BY MOUTH DAILY.     CALCIUM + D PO  Take 1 tablet by mouth daily.     capecitabine 500 MG tablet  Commonly known as:  XELODA  Take 2 tablets twice a day throughout all of the radiation.     FLAX PO  Take 1 tablet by mouth daily.     glipiZIDE-metformin 5-500 MG per tablet  Commonly known as:  METAGLIP  Take 1 tablet by mouth 2 (two) times daily before a meal.     l-methylfolate-B6-B12 3-35-2 MG Tabs  Commonly known as:  METANX  Take 1 tablet by mouth daily.     lansoprazole 15 MG capsule  Commonly known as:  PREVACID  Take 15 mg by mouth daily as needed (for heartburn).     lidocaine-prilocaine cream  Commonly known as:  EMLA  Apply 1 application topically as needed. Apply quarter sized amount to portacath site at least one hour prior to chemo treatment     megestrol 400 MG/10ML suspension  Commonly known as:  MEGACE  Take 800 mg by mouth  daily as needed (only uses when stomach is upset or feels stomach "needs" it).     multivitamin with minerals Tabs tablet  Take 1 tablet by mouth daily.     vitamin B-6 250 MG tablet  Take 1 tablet (250 mg total) by mouth daily.        Allergies: No Known Allergies  Past Medical History, Surgical history, Social history, and Family History were reviewed and updated.  Review of Systems: All other 10 point review of systems is negative.   Physical Exam:  height is 5\' 1"  (1.549 m) and weight is 132 lb (59.875 kg). Her oral temperature is 97.7 F (36.5 C). Her blood pressure is 150/84 and her pulse is 107. Her respiration is 18.   Wt Readings from Last 3 Encounters:  05/28/15 132 lb (59.875 kg)  05/24/15 133 lb 4.8 oz (60.464 kg)  05/17/15 132 lb 11.2 oz (60.192 kg)    Head and neck exam shows no ocular or oral lesions. She has no scleral icterus. Lungs are clear bilaterally. Cardiac exam regular rate and rhythm with no murmurs, rubs or bruits. Abdomen is soft. She has good bowel sounds. There is  no fluid wave. She has no palpable abdominal mass.There is no palpable liver or spleen tip. Back exam shows no tenderness over the spine, ribs or hips. Extremities shows no clubbing, cyanosis or edema. She has good range of motion of her joints. Skin exam shows no rashes, ecchymoses or petechia. Neurological exam shows no focal neurological deficits. Lab Results  Component Value Date   WBC 7.2 05/28/2015   HGB 12.7 05/28/2015   HCT 38.0 05/28/2015   MCV 75* 05/28/2015   PLT 244 05/28/2015   Lab Results  Component Value Date   FERRITIN 205 06/25/2014   IRON 44 06/25/2014   TIBC 301 06/25/2014   UIBC 257 06/25/2014   IRONPCTSAT 15* 06/25/2014   Lab Results  Component Value Date   RBC 5.07 05/28/2015   No results found for: KPAFRELGTCHN, LAMBDASER, KAPLAMBRATIO No results found for: IGGSERUM, IGA, IGMSERUM No results found for: Odetta Pink, SPEI   Chemistry      Component Value Date/Time   NA 138 05/28/2015 1043   NA 143 05/07/2015 1034   K 4.5 05/28/2015 1043   K 3.9 05/07/2015 1034   CL 105 05/28/2015 1043   CL 104 05/07/2015 1034   CO2 22 05/28/2015 1043   CO2 21 05/07/2015 1034   BUN 18 05/28/2015 1043   BUN 13 05/07/2015 1034   CREATININE 0.75 05/28/2015 1043   CREATININE 0.8 05/07/2015 1034      Component Value Date/Time   CALCIUM 9.8 05/28/2015 1043   CALCIUM 10.0 05/07/2015 1034   ALKPHOS 97 05/28/2015 1043   ALKPHOS 211* 05/07/2015 1034   AST 23 05/28/2015 1043   AST 41* 05/07/2015 1034   ALT 22 05/28/2015 1043   ALT 52* 05/07/2015 1034   BILITOT 0.6 05/28/2015 1043   BILITOT 1.50 05/07/2015 1034     Impression and Plan: Ms. Zahniser is 69 year old female. She is metastatic adenocarcinoma of pancreas.   I think that she is responding to radiation with Xeloda. She's having some problems with the Xeloda so I told her to take 1 pill twice a day until Friday. She finishes the radiation next Monday so stop in on Friday would be okay.  Her CA 19-9 is down to 10,039.  I think this also is a good sign for response.  I would then plan to give her a month off. I would then repeat her scans to see how well she has responded. Again, I have believe that she is responding despite her look and by her labs.  I think she really needs a "break" from treatment.  Hopefully we will see a good result on the CT scan and then be able to give her a break from treatment. She would like this.  I spent about 30 minutes with her today talking to her.  [name], MD 7/27/20167:29 AM

## 2015-05-30 ENCOUNTER — Ambulatory Visit
Admission: RE | Admit: 2015-05-30 | Discharge: 2015-05-30 | Disposition: A | Discharge status: Home / Self Care | Payer: Medicare Other | Source: Ambulatory Visit | Attending: Radiation Oncology | Admitting: Radiation Oncology

## 2015-05-30 ENCOUNTER — Ambulatory Visit
Admit: 2015-05-30 | Discharge: 2015-05-30 | Disposition: A | Discharge status: Home / Self Care | Payer: Medicare Other | Attending: Radiation Oncology | Admitting: Radiation Oncology

## 2015-05-30 DIAGNOSIS — C259 Malignant neoplasm of pancreas, unspecified: Secondary | ICD-10-CM

## 2015-05-30 DIAGNOSIS — C7800 Secondary malignant neoplasm of unspecified lung: Principal | ICD-10-CM

## 2015-05-30 DIAGNOSIS — Z510 Encounter for antineoplastic radiation therapy: Secondary | ICD-10-CM | POA: Diagnosis not present

## 2015-05-30 NOTE — Progress Notes (Signed)
  Radiation Oncology         [phone]   Name: Jacqueline Hahn MRN: [id number]   Date: 05/30/2015  DOB: December 25, 1945     Weekly Radiation Therapy Management  Diagnosis: Jacqueline Hahn is a 69 year old woman presenting in clinic in regards to her pancreatic cancer metastasized to the lung.  Current Dose: 41.4 Gy  Planned Dose:  45 Gy  Narrative The patient presents for routine under treatment assessment and has completed 23 fractions to her upper abdomen out of 40. She currently denies any pain, N&V, or weight loss. However, she is experiencing symptoms of bloating with gas, constipation, and fatigue. The patient is self administering Miralax and or MOM with results every other day to alleviate and address these developing symptoms. To address fatigue, she goes to bed early but does not partake in day napping. Set-up films were reviewed and the chart was checked.  Physical Findings  Her weight is currently essentially stable.  No jaundice  Impression Jacqueline Hahn is a 69 year old woman presenting in clinic in regards to her pancreatic cancer metastasized to the lymph nodes and lung. The patient is tolerating radiation.  Plan All questions vocalized by the patient were fully addressed. Common symptoms to expect at this time in the patient's recovery were discussed and reviewed. Healthy methods to manage these symptoms if they are to occur were addressed. The patient has been advised to continue treatment as planned. If he develops any further questions or concerns in regards to her treatment and recovery, he has been encouraged to contact Dr. Tammi Klippel, MD.   This document serves as a record of services personally performed by Tyler Pita, MD. It was created on his behalf by Lenn Cal, a trained medical scribe. The creation of this record is based on the scribe's personal observations and the provider's statements to them. This document has been checked and approved by the attending  provider.  ______________________________________________   Sheral Apley. Tammi Klippel, M.D.

## 2015-05-30 NOTE — Progress Notes (Signed)
Jacqueline Hahn has received 23 to her upper abdomen.  She denies any pain, nor N&V.  Reports bloating with gas.  Does have constipation and is taking Miralax and or MOM with results every other day.  She reports fatigue and goes to bed earlier, no naps.

## 2015-05-31 ENCOUNTER — Ambulatory Visit
Admit: 2015-05-31 | Discharge: 2015-05-31 | Disposition: A | Discharge status: Home / Self Care | Payer: Medicare Other | Attending: Radiation Oncology | Admitting: Radiation Oncology

## 2015-05-31 DIAGNOSIS — Z510 Encounter for antineoplastic radiation therapy: Secondary | ICD-10-CM | POA: Diagnosis not present

## 2015-06-03 ENCOUNTER — Encounter: Payer: Self-pay | Admitting: Radiation Oncology

## 2015-06-03 ENCOUNTER — Ambulatory Visit
Admit: 2015-06-03 | Discharge: 2015-06-03 | Disposition: A | Discharge status: Home / Self Care | Payer: Medicare Other | Attending: Radiation Oncology | Admitting: Radiation Oncology

## 2015-06-03 DIAGNOSIS — Z510 Encounter for antineoplastic radiation therapy: Secondary | ICD-10-CM | POA: Diagnosis not present

## 2015-06-12 NOTE — Progress Notes (Signed)
  Radiation Oncology         [phone] ________________________________  Name: Jacqueline Hahn MRN: [id number]  Date: 06/03/2015  DOB: Oct 01, 1946  End of Treatment Note   ICD-9-CM ICD-10-CM    1. Pancreas carcinoma 157.9 C25.9     DIAGNOSIS: 69 year old woman with lymphadenopathy from pancreatic cancer causing biliary obstruction.     Indication for treatment:  Palliation, Local Control       Radiation treatment dates:   04/29/2015-06/03/2015  Site/dose:   The pancreas and peripancreatic adenopathy were treated to 45 Gy in 25 fractions of 1.8 Gy  Beams/energy:   A 4-field technique was used with 15 MV X-rays and image-guidance CTs.  Narrative: The patient tolerated radiation treatment relatively well.   She had some bloating and constipation which were managed during radiotherapy.  Weight was relatively steady.  Plan: The patient has completed radiation treatment. The patient will return to radiation oncology clinic for routine followup in one month. I advised her to call or return sooner if she has any questions or concerns related to her recovery or treatment. ________________________________  Sheral Apley. Tammi Klippel, M.D.

## 2015-06-24 ENCOUNTER — Other Ambulatory Visit (HOSPITAL_BASED_OUTPATIENT_CLINIC_OR_DEPARTMENT_OTHER): Payer: Medicare Other

## 2015-06-24 DIAGNOSIS — C7800 Secondary malignant neoplasm of unspecified lung: Secondary | ICD-10-CM

## 2015-06-24 DIAGNOSIS — C259 Malignant neoplasm of pancreas, unspecified: Secondary | ICD-10-CM

## 2015-06-24 LAB — CBC WITH DIFFERENTIAL (CANCER CENTER ONLY)
BASO#: 0 10*3/uL (ref 0.0–0.2)
BASO%: 0.4 % (ref 0.0–2.0)
EOS ABS: 0.2 10*3/uL (ref 0.0–0.5)
EOS%: 2 % (ref 0.0–7.0)
HCT: 39.8 % (ref 34.8–46.6)
HEMOGLOBIN: 13.3 g/dL (ref 11.6–15.9)
LYMPH#: 1.7 10*3/uL (ref 0.9–3.3)
LYMPH%: 21.7 % (ref 14.0–48.0)
MCH: 24.8 pg — ABNORMAL LOW (ref 26.0–34.0)
MCHC: 33.4 g/dL (ref 32.0–36.0)
MCV: 74 fL — ABNORMAL LOW (ref 81–101)
MONO#: 1.2 10*3/uL — ABNORMAL HIGH (ref 0.1–0.9)
MONO%: 14.6 % — AB (ref 0.0–13.0)
NEUT%: 61.3 % (ref 39.6–80.0)
NEUTROS ABS: 4.8 10*3/uL (ref 1.5–6.5)
Platelets: 304 10*3/uL (ref 145–400)
RBC: 5.36 10*6/uL — ABNORMAL HIGH (ref 3.70–5.32)
RDW: 16.5 % — ABNORMAL HIGH (ref 11.1–15.7)
WBC: 7.9 10*3/uL (ref 3.9–10.0)

## 2015-06-24 LAB — CMP (CANCER CENTER ONLY)
ALT(SGPT): 32 U/L (ref 10–47)
AST: 30 U/L (ref 11–38)
Albumin: 3.6 g/dL (ref 3.3–5.5)
Alkaline Phosphatase: 98 U/L — ABNORMAL HIGH (ref 26–84)
BILIRUBIN TOTAL: 0.5 mg/dL (ref 0.20–1.60)
BUN: 12 mg/dL (ref 7–22)
CO2: 22 mEq/L (ref 18–33)
CREATININE: 0.6 mg/dL (ref 0.6–1.2)
Calcium: 9.6 mg/dL (ref 8.0–10.3)
Chloride: 101 mEq/L (ref 98–108)
GLUCOSE: 109 mg/dL (ref 73–118)
Potassium: 3.6 mEq/L (ref 3.3–4.7)
Sodium: 136 mEq/L (ref 128–145)
Total Protein: 7.7 g/dL (ref 6.4–8.1)

## 2015-06-25 LAB — PREALBUMIN: PREALBUMIN: 26 mg/dL (ref 17–34)

## 2015-06-25 LAB — CANCER ANTIGEN 19-9: CA 19 9: 10956.7 U/mL — AB (ref <35.0)

## 2015-07-01 ENCOUNTER — Ambulatory Visit (HOSPITAL_BASED_OUTPATIENT_CLINIC_OR_DEPARTMENT_OTHER)
Admission: RE | Admit: 2015-07-01 | Discharge: 2015-07-01 | Disposition: A | Discharge status: Home / Self Care | Payer: Medicare Other | Source: Ambulatory Visit | Attending: Hematology & Oncology | Admitting: Hematology & Oncology

## 2015-07-01 ENCOUNTER — Encounter: Payer: Self-pay | Admitting: Hematology & Oncology

## 2015-07-01 ENCOUNTER — Ambulatory Visit (HOSPITAL_BASED_OUTPATIENT_CLINIC_OR_DEPARTMENT_OTHER): Payer: Medicare Other | Admitting: Hematology & Oncology

## 2015-07-01 ENCOUNTER — Encounter (HOSPITAL_BASED_OUTPATIENT_CLINIC_OR_DEPARTMENT_OTHER): Payer: Self-pay

## 2015-07-01 VITALS — BP 180/106 | HR 103 | Temp 98.2°F | Resp 18 | Ht 61.0 in | Wt 130.0 lb

## 2015-07-01 DIAGNOSIS — C7800 Secondary malignant neoplasm of unspecified lung: Secondary | ICD-10-CM | POA: Insufficient documentation

## 2015-07-01 DIAGNOSIS — K5909 Other constipation: Secondary | ICD-10-CM

## 2015-07-01 DIAGNOSIS — C259 Malignant neoplasm of pancreas, unspecified: Secondary | ICD-10-CM

## 2015-07-01 DIAGNOSIS — T402X5A Adverse effect of other opioids, initial encounter: Secondary | ICD-10-CM

## 2015-07-01 DIAGNOSIS — R590 Localized enlarged lymph nodes: Secondary | ICD-10-CM | POA: Insufficient documentation

## 2015-07-01 DIAGNOSIS — K5903 Drug induced constipation: Secondary | ICD-10-CM

## 2015-07-01 MED ORDER — NALOXEGOL OXALATE 25 MG PO TABS: 25.0000 mg | ORAL_TABLET | Freq: Every day | ORAL | Status: DC

## 2015-07-01 MED ORDER — IOHEXOL 300 MG/ML  SOLN
100.0000 mL | Freq: Once | INTRAMUSCULAR | Status: AC | PRN
  Administered 2015-07-01: 100 mL via INTRAVENOUS

## 2015-07-01 NOTE — Progress Notes (Signed)
Hematology and Oncology Follow Up Visit  Jacqueline Hahn [id number] [birth year] 69 y.o. 07/01/2015   Principle Diagnosis:  Metastatic adenocarcinoma of the pancreas  Current Therapy:   Completed radiation therapy with Xeloda for local progression August 1    Interim History: Jacqueline Hahn is back for follow-up. She looks pretty good. She got to the radiation with Xeloda. We had to stop the Xeloda a little bit early because of toxicity.  However, it worked very nicely. Her bilirubin has normalized.  We did go ahead and get scans on her. Unfortunately, the scans shows that she has significant pulmonary progression of her disease. She has more pulmonary nodules and larger ones. In the abdomen, her disease appears to have improved. The localized recurrence causing biliary obstruction has improved nicely. Her tumor mass now measures 1.8 x 1.6 cm. There is noted to be a new 0.7 cm peritoneal nodule. There is no ascites. There is really no abdominal or pelvic lymphadenopathy.  Her last CA 19-9 was going up a little bit at 10,960.  She's really had no problems pain wise. She is not gaining weight. Her appetite is all right. She did have a some vague abdominal discomfort.  There is some constipation. I gave her a prescription for Movantik.   Overall, her performance status is ECOG 1.  Medications:    Medication List       This list is accurate as of: 07/01/15  5:00 PM.  Always use your most recent med list.               amLODipine 10 MG tablet  Commonly known as:  NORVASC  TAKE 1 TABLET (10 MG TOTAL) BY MOUTH DAILY.     CALCIUM + D PO  Take 1 tablet by mouth daily.     capecitabine 500 MG tablet  Commonly known as:  XELODA  Take 2 tablets twice a day throughout all of the radiation.     FLAX PO  Take 1 tablet by mouth daily.     glipiZIDE-metformin 5-500 MG per tablet  Commonly known as:  METAGLIP  Take 1 tablet by mouth 2 (two) times daily before a meal.     l-methylfolate-B6-B12  3-35-2 MG Tabs  Commonly known as:  METANX  Take 1 tablet by mouth daily.     lansoprazole 15 MG capsule  Commonly known as:  PREVACID  Take 15 mg by mouth daily as needed (for heartburn).     lidocaine-prilocaine cream  Commonly known as:  EMLA  Apply 1 application topically as needed. Apply quarter sized amount to portacath site at least one hour prior to chemo treatment     megestrol 400 MG/10ML suspension  Commonly known as:  MEGACE  Take 800 mg by mouth daily as needed (only uses when stomach is upset or feels stomach "needs" it).     multivitamin with minerals Tabs tablet  Take 1 tablet by mouth daily.     naloxegol oxalate 25 MG Tabs tablet  Commonly known as:  MOVANTIK  Take 1 tablet (25 mg total) by mouth daily.     vitamin B-6 250 MG tablet  Take 1 tablet (250 mg total) by mouth daily.        Allergies: No Known Allergies  Past Medical History, Surgical history, Social history, and Family History were reviewed and updated.  Review of Systems: All other 10 point review of systems is negative.   Physical Exam:  height is 5\' 1"  (1.549 m) and weight  is 130 lb (58.968 kg). Her oral temperature is 98.2 F (36.8 C). Her blood pressure is 180/106 and her pulse is 103. Her respiration is 18.   Wt Readings from Last 3 Encounters:  07/01/15 130 lb (58.968 kg)  05/28/15 132 lb (59.875 kg)  05/24/15 133 lb 4.8 oz (60.464 kg)    Head and neck exam shows no ocular or oral lesions. She has no scleral icterus. Lungs are clear bilaterally. Cardiac exam regular rate and rhythm with no murmurs, rubs or bruits. Abdomen is soft. She has good bowel sounds. There is no fluid wave. She has no palpable abdominal mass.There is no palpable liver or spleen tip. Back exam shows no tenderness over the spine, ribs or hips. Extremities shows no clubbing, cyanosis or edema. She has good range of motion of her joints. Skin exam shows no rashes, ecchymoses or petechia. Neurological exam shows no  focal neurological deficits. Lab Results  Component Value Date   WBC 7.9 06/24/2015   HGB 13.3 06/24/2015   HCT 39.8 06/24/2015   MCV 74* 06/24/2015   PLT 304 06/24/2015   Lab Results  Component Value Date   FERRITIN 205 06/25/2014   IRON 44 06/25/2014   TIBC 301 06/25/2014   UIBC 257 06/25/2014   IRONPCTSAT 15* 06/25/2014   Lab Results  Component Value Date   RBC 5.36* 06/24/2015   No results found for: KPAFRELGTCHN, LAMBDASER, KAPLAMBRATIO No results found for: IGGSERUM, IGA, IGMSERUM No results found for: Odetta Pink, SPEI   Chemistry      Component Value Date/Time   NA 136 06/24/2015 0955   NA 138 05/28/2015 1043   K 3.6 06/24/2015 0955   K 4.5 05/28/2015 1043   CL 101 06/24/2015 0955   CL 105 05/28/2015 1043   CO2 22 06/24/2015 0955   CO2 22 05/28/2015 1043   BUN 12 06/24/2015 0955   BUN 18 05/28/2015 1043   CREATININE 0.6 06/24/2015 0955   CREATININE 0.75 05/28/2015 1043      Component Value Date/Time   CALCIUM 9.6 06/24/2015 0955   CALCIUM 9.8 05/28/2015 1043   ALKPHOS 98* 06/24/2015 0955   ALKPHOS 97 05/28/2015 1043   AST 30 06/24/2015 0955   AST 23 05/28/2015 1043   ALT 32 06/24/2015 0955   ALT 22 05/28/2015 1043   BILITOT 0.50 06/24/2015 0955   BILITOT 0.6 05/28/2015 1043     Impression and Plan: Jacqueline Hahn is 69 year old female. She is metastatic adenocarcinoma of pancreas.   I think that she has done quite well. Has been a year since we first saw her.  Clearly, her disease is more progressive now.  She is not having a lot of symptoms.  As far as treating this, our only option I think would be FOLFOX or FOLFOXIRI but this would require a Port-A-Cath. She has tried to avoid this.  I will have to speak with her because I do not have the results back from the CT scan when she was here this morning. Once I speak with her, we'll probably have to have her come back in so we can figure out how we  can try to manage this.  One option would certainly be a clinical trial enrollment.  I spent about 30 minutes with her today talking to her.  [name], MD 8/29/20165:00 PM

## 2015-07-02 ENCOUNTER — Telehealth: Payer: Self-pay | Admitting: Hematology & Oncology

## 2015-07-02 NOTE — Telephone Encounter (Signed)
Contacted pt regarding 9/1 appt at 12:45. Pt confirmed appt

## 2015-07-04 ENCOUNTER — Encounter: Payer: Self-pay | Admitting: Hematology & Oncology

## 2015-07-04 ENCOUNTER — Ambulatory Visit (HOSPITAL_BASED_OUTPATIENT_CLINIC_OR_DEPARTMENT_OTHER): Payer: Medicare Other | Admitting: Hematology & Oncology

## 2015-07-04 VITALS — BP 137/92 | HR 127 | Temp 98.2°F | Resp 16 | Ht 61.0 in | Wt 128.0 lb

## 2015-07-04 DIAGNOSIS — C7800 Secondary malignant neoplasm of unspecified lung: Secondary | ICD-10-CM

## 2015-07-04 DIAGNOSIS — C259 Malignant neoplasm of pancreas, unspecified: Secondary | ICD-10-CM | POA: Diagnosis not present

## 2015-07-04 DIAGNOSIS — K5900 Constipation, unspecified: Secondary | ICD-10-CM

## 2015-07-04 DIAGNOSIS — K5909 Other constipation: Secondary | ICD-10-CM

## 2015-07-04 MED ORDER — LACTULOSE 10 GM/15ML PO SOLN: 20.0000 g | Freq: Three times a day (TID) | ORAL | Status: DC

## 2015-07-04 NOTE — Progress Notes (Signed)
Hematology and Oncology Follow Up Visit  Jacqueline Hahn [id number] 1945/12/11 69 y.o. 07/04/2015   Principle Diagnosis:  Metastatic adenocarcinoma of the pancreas  Current Therapy:   Completed radiation therapy with Xeloda for local progression August 1  I discussed with her FOLFOX    Interim History: Jacqueline Hahn is back fora visit. Unfortunately, her CT scan that she had earlier this week shows that she has progressive disease. She has pulmonary progression. Down the abdomen, everything looks pretty good. She had a very nice response which she had the radiation. This is why she is no longer jaundiced. All was also noted in the abdomen was a new 7 mm peritoneal nodule. There is no ascites.  Her main problem has been constipation. She has been on different treatments for this. So far, nothing has helped. She is not yet tried lactulose. I will give her some lactulose. She will try this 3 times a day.  She is having some abdominal discomfort because of constipation. She really does not like taking medications for pain.  She's had no bleeding.  She's had no burning or urinary frequency.  She's had no cough. She's had no chest wall pain. She's had no swallowing difficulties.  Overall, her performance status is ECOG 1..  She's really had no problems pain wise. She is not gaining weight. Her appetite is all right. She did have a some vague abdominal discomfort.  There is some constipation. I gave her a prescription for Movantik.   Overall, her performance status is ECOG 1.  Medications:    Medication List       This list is accurate as of: 07/04/15  1:34 PM.  Always use your most recent med list.               amLODipine 10 MG tablet  Commonly known as:  NORVASC  TAKE 1 TABLET (10 MG TOTAL) BY MOUTH DAILY.     CALCIUM + D PO  Take 1 tablet by mouth daily.     capecitabine 500 MG tablet  Commonly known as:  XELODA  Take 2 tablets twice a day throughout all of the radiation.     FLAX PO  Take 1 tablet by mouth daily.     glipiZIDE-metformin 5-500 MG per tablet  Commonly known as:  METAGLIP  Take 1 tablet by mouth 2 (two) times daily before a meal.     l-methylfolate-B6-B12 3-35-2 MG Tabs  Commonly known as:  METANX  Take 1 tablet by mouth daily.     lactulose 10 GM/15ML solution  Commonly known as:  CHRONULAC  Take 30 mLs (20 g total) by mouth 3 (three) times daily.     lidocaine-prilocaine cream  Commonly known as:  EMLA  Apply 1 application topically as needed. Apply quarter sized amount to portacath site at least one hour prior to chemo treatment     megestrol 400 MG/10ML suspension  Commonly known as:  MEGACE  Take 800 mg by mouth daily as needed (only uses when stomach is upset or feels stomach "needs" it).     multivitamin with minerals Tabs tablet  Take 1 tablet by mouth daily.     naloxegol oxalate 25 MG Tabs tablet  Commonly known as:  MOVANTIK  Take 1 tablet (25 mg total) by mouth daily.        Allergies: No Known Allergies  Past Medical History, Surgical history, Social history, and Family History were reviewed and updated.  Review of Systems: All other  10 point review of systems is negative.   Physical Exam:  height is 5\' 1"  (1.549 m) and weight is 128 lb (58.06 kg). Her oral temperature is 98.2 F (36.8 C). Her blood pressure is 137/92 and her pulse is 127. Her respiration is 16.   Wt Readings from Last 3 Encounters:  07/04/15 128 lb (58.06 kg)  07/01/15 130 lb (58.968 kg)  05/28/15 132 lb (59.875 kg)    Head and neck exam shows no ocular or oral lesions. She has no scleral icterus. Lungs are clear bilaterally. Cardiac exam regular rate and rhythm with no murmurs, rubs or bruits. Abdomen is soft. She has good bowel sounds. There is no fluid wave. She has no palpable abdominal mass.There is no palpable liver or spleen tip. Back exam shows no tenderness over the spine, ribs or hips. Extremities shows no clubbing, cyanosis or  edema. She has good range of motion of her joints. Skin exam shows no rashes, ecchymoses or petechia. Neurological exam shows no focal neurological deficits. Lab Results  Component Value Date   WBC 7.9 06/24/2015   HGB 13.3 06/24/2015   HCT 39.8 06/24/2015   MCV 74* 06/24/2015   PLT 304 06/24/2015   Lab Results  Component Value Date   FERRITIN 205 06/25/2014   IRON 44 06/25/2014   TIBC 301 06/25/2014   UIBC 257 06/25/2014   IRONPCTSAT 15* 06/25/2014   Lab Results  Component Value Date   RBC 5.36* 06/24/2015   No results found for: KPAFRELGTCHN, LAMBDASER, KAPLAMBRATIO No results found for: IGGSERUM, IGA, IGMSERUM No results found for: Odetta Pink, SPEI   Chemistry      Component Value Date/Time   NA 136 06/24/2015 0955   NA 138 05/28/2015 1043   K 3.6 06/24/2015 0955   K 4.5 05/28/2015 1043   CL 101 06/24/2015 0955   CL 105 05/28/2015 1043   CO2 22 06/24/2015 0955   CO2 22 05/28/2015 1043   BUN 12 06/24/2015 0955   BUN 18 05/28/2015 1043   CREATININE 0.6 06/24/2015 0955   CREATININE 0.75 05/28/2015 1043      Component Value Date/Time   CALCIUM 9.6 06/24/2015 0955   CALCIUM 9.8 05/28/2015 1043   ALKPHOS 98* 06/24/2015 0955   ALKPHOS 97 05/28/2015 1043   AST 30 06/24/2015 0955   AST 23 05/28/2015 1043   ALT 32 06/24/2015 0955   ALT 22 05/28/2015 1043   BILITOT 0.50 06/24/2015 0955   BILITOT 0.6 05/28/2015 1043     Impression and Plan: Jacqueline Hahn is 69 year old female. She is metastatic adenocarcinoma of pancreas.   I spent about 45 minutes talking with her today.  I showed her the scan results. Again, she is progressing with pulmonary metastasis.  Her CA 19-9 is also able bit. I think it was 10,900 we checked it.  I think that given the fact that she has already had Gemzar/Abraxane, the next option for her would be FOLFOX. I think this would be reasonable. She should be able to tolerate this. She would not  lose her hair.  The one problem that she is having his constipation. This chemotherapy can certainly help with the constipation. However, I'm going to give her a prescription for lactulose to take. She will do this 3 times a day.  I told her that I would try 4 cycles of treatment and then repeat the scan and see how everything looks.  I went over some of the  other side effects with her. I told her that she cannot drink anything cold for 3 or 4 days after each treatment. I told her that this is from the oxaliplatin. I also told her that with cooler weather coming in, that she needs to wear gloves if the temperature gets below 40.  I think that we will know how things are working by the CA 19-9.  We will start treatment on her next week on September 7. We'll have her come back and see [place] on September 21    .[name], MD 9/1/20161:34 PM

## 2015-07-10 ENCOUNTER — Ambulatory Visit (HOSPITAL_BASED_OUTPATIENT_CLINIC_OR_DEPARTMENT_OTHER): Payer: Medicare Other

## 2015-07-10 DIAGNOSIS — C259 Malignant neoplasm of pancreas, unspecified: Secondary | ICD-10-CM | POA: Diagnosis not present

## 2015-07-10 DIAGNOSIS — K8689 Other specified diseases of pancreas: Secondary | ICD-10-CM

## 2015-07-10 DIAGNOSIS — Z5111 Encounter for antineoplastic chemotherapy: Secondary | ICD-10-CM

## 2015-07-10 DIAGNOSIS — C7800 Secondary malignant neoplasm of unspecified lung: Secondary | ICD-10-CM | POA: Diagnosis not present

## 2015-07-10 MED ORDER — FLUOROURACIL CHEMO INJECTION 2.5 GM/50ML
400.0000 mg/m2 | Freq: Once | INTRAVENOUS | Status: AC
  Administered 2015-07-10: 650 mg via INTRAVENOUS
  Filled 2015-07-10: qty 13

## 2015-07-10 MED ORDER — OXALIPLATIN CHEMO INJECTION 100 MG/20ML
76.5000 mg/m2 | Freq: Once | INTRAVENOUS | Status: AC
  Administered 2015-07-10: 120 mg via INTRAVENOUS
  Filled 2015-07-10: qty 24

## 2015-07-10 MED ORDER — FLUOROURACIL CHEMO INJECTION 5 GM/100ML
2000.0000 mg/m2 | INTRAVENOUS | Status: DC
  Administered 2015-07-10: 3150 mg via INTRAVENOUS
  Filled 2015-07-10: qty 63

## 2015-07-10 MED ORDER — SODIUM CHLORIDE 0.9 % IV SOLN
Freq: Once | INTRAVENOUS | Status: AC
  Administered 2015-07-10: 12:00:00 via INTRAVENOUS
  Filled 2015-07-10: qty 4

## 2015-07-10 MED ORDER — LEUCOVORIN CALCIUM INJECTION 350 MG
405.0000 mg/m2 | Freq: Once | INTRAVENOUS | Status: AC
  Administered 2015-07-10: 640 mg via INTRAVENOUS
  Filled 2015-07-10: qty 32

## 2015-07-10 MED ORDER — DEXTROSE 5 % IV SOLN
Freq: Once | INTRAVENOUS | Status: AC
  Administered 2015-07-10: 12:00:00 via INTRAVENOUS

## 2015-07-10 NOTE — Patient Instructions (Addendum)
[facility] Discharge Instructions for Patients Receiving Chemotherapy  Today you received the following chemotherapy agents Oxaliplatin, 5FU  To help prevent nausea and vomiting after your treatment, we encourage you to take your nausea medication   1) Zofran 8 mg.  Take 1 tablet by mouth 2 times daily.  Start the day after chemo for 2 days.  Then take as needed for nausea or vomiting.  2) Dexamethasone 4 mg tablet.  Take 2 tablets by mouth 2 times daily with a meal.  Start the day after chemotherapy for 2 days.  Take with food.  3) Prochlorperazine.  Take 1 tablet by mouth every 6 hours as needed for nausea.    If you develop nausea and vomiting that is not controlled by your nausea medication, call the clinic.   BELOW ARE SYMPTOMS THAT SHOULD BE REPORTED IMMEDIATELY:  *FEVER GREATER THAN 100.5 F  *CHILLS WITH OR WITHOUT FEVER  NAUSEA AND VOMITING THAT IS NOT CONTROLLED WITH YOUR NAUSEA MEDICATION  *UNUSUAL SHORTNESS OF BREATH  *UNUSUAL BRUISING OR BLEEDING  TENDERNESS IN MOUTH AND THROAT WITH OR WITHOUT PRESENCE OF ULCERS  *URINARY PROBLEMS  *BOWEL PROBLEMS  UNUSUAL RASH Items with * indicate a potential emergency and should be followed up as soon as possible.  Feel free to call the clinic you have any questions or concerns. The clinic phone number is [phone].  Please show the [facility] at check-in to the Emergency Department and triage nurse.

## 2015-07-11 ENCOUNTER — Telehealth: Payer: Self-pay | Admitting: *Deleted

## 2015-07-11 NOTE — Telephone Encounter (Signed)
Called patient to check on post chemo status.  Patient threw up last night but has not thrown up since.  Took Zofran and Decadron as ordered this am and has not thrown up. Still somewhat nauseated.  Encouraged patient to take her Compazine or Ativan as ordered prn.  Patient also stated she has a headache and told patient she could take Tylenol 650 mg.  Encouraged patient to call us if not resolved

## 2015-07-12 ENCOUNTER — Ambulatory Visit (HOSPITAL_BASED_OUTPATIENT_CLINIC_OR_DEPARTMENT_OTHER): Payer: Medicare Other

## 2015-07-12 VITALS — BP 165/93 | HR 112 | Temp 98.2°F | Resp 18

## 2015-07-12 DIAGNOSIS — C259 Malignant neoplasm of pancreas, unspecified: Secondary | ICD-10-CM | POA: Diagnosis not present

## 2015-07-12 DIAGNOSIS — Z452 Encounter for adjustment and management of vascular access device: Secondary | ICD-10-CM | POA: Diagnosis not present

## 2015-07-12 DIAGNOSIS — C7800 Secondary malignant neoplasm of unspecified lung: Principal | ICD-10-CM

## 2015-07-12 DIAGNOSIS — K8689 Other specified diseases of pancreas: Secondary | ICD-10-CM

## 2015-07-12 MED ORDER — HEPARIN SOD (PORK) LOCK FLUSH 100 UNIT/ML IV SOLN
500.0000 [IU] | Freq: Once | INTRAVENOUS | Status: AC | PRN
  Administered 2015-07-12: 500 [IU]
  Filled 2015-07-12: qty 5

## 2015-07-12 MED ORDER — SODIUM CHLORIDE 0.9 % IJ SOLN
10.0000 mL | INTRAMUSCULAR | Status: DC | PRN
  Administered 2015-07-12: 10 mL
  Filled 2015-07-12: qty 10

## 2015-07-12 NOTE — Patient Instructions (Signed)

## 2015-07-24 ENCOUNTER — Encounter: Payer: Self-pay | Admitting: Family

## 2015-07-24 ENCOUNTER — Other Ambulatory Visit (HOSPITAL_BASED_OUTPATIENT_CLINIC_OR_DEPARTMENT_OTHER): Payer: Medicare Other

## 2015-07-24 ENCOUNTER — Other Ambulatory Visit: Payer: Medicare Other

## 2015-07-24 ENCOUNTER — Ambulatory Visit (HOSPITAL_BASED_OUTPATIENT_CLINIC_OR_DEPARTMENT_OTHER): Payer: Medicare Other

## 2015-07-24 ENCOUNTER — Ambulatory Visit: Payer: Medicare Other

## 2015-07-24 ENCOUNTER — Ambulatory Visit (HOSPITAL_BASED_OUTPATIENT_CLINIC_OR_DEPARTMENT_OTHER): Payer: Medicare Other | Admitting: Family

## 2015-07-24 ENCOUNTER — Ambulatory Visit: Payer: Medicare Other | Admitting: Hematology & Oncology

## 2015-07-24 ENCOUNTER — Ambulatory Visit: Payer: Medicare Other | Admitting: Family

## 2015-07-24 VITALS — BP 172/93 | HR 109 | Temp 98.4°F | Resp 16 | Ht 61.0 in | Wt 126.0 lb

## 2015-07-24 VITALS — BP 149/77 | HR 105 | Resp 18

## 2015-07-24 DIAGNOSIS — C7800 Secondary malignant neoplasm of unspecified lung: Secondary | ICD-10-CM

## 2015-07-24 DIAGNOSIS — C259 Malignant neoplasm of pancreas, unspecified: Secondary | ICD-10-CM | POA: Diagnosis not present

## 2015-07-24 DIAGNOSIS — K8689 Other specified diseases of pancreas: Secondary | ICD-10-CM

## 2015-07-24 DIAGNOSIS — C250 Malignant neoplasm of head of pancreas: Secondary | ICD-10-CM | POA: Diagnosis not present

## 2015-07-24 DIAGNOSIS — K5903 Drug induced constipation: Secondary | ICD-10-CM

## 2015-07-24 DIAGNOSIS — T402X5A Adverse effect of other opioids, initial encounter: Secondary | ICD-10-CM

## 2015-07-24 DIAGNOSIS — Z5111 Encounter for antineoplastic chemotherapy: Secondary | ICD-10-CM

## 2015-07-24 DIAGNOSIS — K5909 Other constipation: Secondary | ICD-10-CM

## 2015-07-24 LAB — CBC WITH DIFFERENTIAL (CANCER CENTER ONLY)
BASO#: 0 10*3/uL (ref 0.0–0.2)
BASO%: 0.4 % (ref 0.0–2.0)
EOS ABS: 0.1 10*3/uL (ref 0.0–0.5)
EOS%: 1.6 % (ref 0.0–7.0)
HEMATOCRIT: 37.2 % (ref 34.8–46.6)
HEMOGLOBIN: 12.6 g/dL (ref 11.6–15.9)
LYMPH#: 1.6 10*3/uL (ref 0.9–3.3)
LYMPH%: 32.1 % (ref 14.0–48.0)
MCH: 24.2 pg — AB (ref 26.0–34.0)
MCHC: 33.9 g/dL (ref 32.0–36.0)
MCV: 72 fL — ABNORMAL LOW (ref 81–101)
MONO#: 1 10*3/uL — ABNORMAL HIGH (ref 0.1–0.9)
MONO%: 20.4 % — AB (ref 0.0–13.0)
NEUT%: 45.5 % (ref 39.6–80.0)
NEUTROS ABS: 2.3 10*3/uL (ref 1.5–6.5)
Platelets: 267 10*3/uL (ref 145–400)
RBC: 5.2 10*6/uL (ref 3.70–5.32)
RDW: 15.5 % (ref 11.1–15.7)
WBC: 5.1 10*3/uL (ref 3.9–10.0)

## 2015-07-24 LAB — CMP (CANCER CENTER ONLY)
ALBUMIN: 3.4 g/dL (ref 3.3–5.5)
ALT(SGPT): 21 U/L (ref 10–47)
AST: 25 U/L (ref 11–38)
Alkaline Phosphatase: 96 U/L — ABNORMAL HIGH (ref 26–84)
BILIRUBIN TOTAL: 0.4 mg/dL (ref 0.20–1.60)
BUN, Bld: 10 mg/dL (ref 7–22)
CALCIUM: 9.5 mg/dL (ref 8.0–10.3)
CHLORIDE: 107 meq/L (ref 98–108)
CO2: 22 meq/L (ref 18–33)
CREATININE: 0.6 mg/dL (ref 0.6–1.2)
Glucose, Bld: 288 mg/dL — ABNORMAL HIGH (ref 73–118)
Potassium: 3.3 mEq/L (ref 3.3–4.7)
SODIUM: 137 meq/L (ref 128–145)
TOTAL PROTEIN: 6.9 g/dL (ref 6.4–8.1)

## 2015-07-24 MED ORDER — DEXTROSE 5 % IV SOLN
Freq: Once | INTRAVENOUS | Status: AC
  Administered 2015-07-24: 11:00:00 via INTRAVENOUS

## 2015-07-24 MED ORDER — FLUOROURACIL CHEMO INJECTION 2.5 GM/50ML
400.0000 mg/m2 | Freq: Once | INTRAVENOUS | Status: AC
  Administered 2015-07-24: 650 mg via INTRAVENOUS
  Filled 2015-07-24: qty 13

## 2015-07-24 MED ORDER — TRAMADOL HCL 50 MG PO TABS: 50.0000 mg | ORAL_TABLET | Freq: Three times a day (TID) | ORAL | Status: DC | PRN

## 2015-07-24 MED ORDER — DEXTROSE 5 % IV SOLN
400.0000 mg/m2 | Freq: Once | INTRAVENOUS | Status: AC
  Administered 2015-07-24: 632 mg via INTRAVENOUS
  Filled 2015-07-24: qty 31.6

## 2015-07-24 MED ORDER — FLUOROURACIL CHEMO INJECTION 5 GM/100ML
2000.0000 mg/m2 | INTRAVENOUS | Status: DC
  Administered 2015-07-24: 3150 mg via INTRAVENOUS
  Filled 2015-07-24: qty 63

## 2015-07-24 MED ORDER — OXALIPLATIN CHEMO INJECTION 100 MG/20ML
76.5000 mg/m2 | Freq: Once | INTRAVENOUS | Status: AC
  Administered 2015-07-24: 120 mg via INTRAVENOUS
  Filled 2015-07-24: qty 24

## 2015-07-24 MED ORDER — SODIUM CHLORIDE 0.9 % IV SOLN
Freq: Once | INTRAVENOUS | Status: AC
  Administered 2015-07-24: 11:00:00 via INTRAVENOUS
  Filled 2015-07-24: qty 4

## 2015-07-24 NOTE — Progress Notes (Signed)
Hematology and Oncology Follow Up Visit  Jacqueline Hahn [id number] 01-28-1946 69 y.o. 07/24/2015   Principle Diagnosis:  Metastatic adenocarcinoma of the pancreas  Current Therapy:   Completed radiation therapy with Xeloda for local progression August 1 FOLFOX q 14 days s/p cycle 1     Interim History: Jacqueline Hahn is here today for follow-up and cycle 2 of FOLFOX. She is still experiencing constipation. She has had to do several water enemas to get things started. She has also had gas and abdominal cramping. She has a good appetite on the Megace but with the constipation she does not want to eat much.  She has seen GI and started taking Linzess and Dexilant daily. This has helped some but she states that her stool is "an orange color." No blood in her stool. She is not anemic at this time.   She has some mid back pain that she has been taking Aleve for.  She has not experienced any fever, chills, n/v, cough, rash, headache, dizziness, chest pain, palpitations.  She has an appointment with Dr. Benson Norway on October 2nd to evaluate stent placement in the common bile duct.  No swelling, tenderness, numbness or tingling in her extremities.   Medications:    Medication List       This list is accurate as of: 07/24/15 10:49 AM.  Always use your most recent med list.               amLODipine 10 MG tablet  Commonly known as:  NORVASC  TAKE 1 TABLET (10 MG TOTAL) BY MOUTH DAILY.     CALCIUM + D PO  Take 1 tablet by mouth daily.     capecitabine 500 MG tablet  Commonly known as:  XELODA  Take 2 tablets twice a day throughout all of the radiation.     FLAX PO  Take 1 tablet by mouth daily.     GENERLAC 10 GM/15ML Soln  Generic drug:  lactulose (encephalopathy)     glipiZIDE-metformin 5-500 MG per tablet  Commonly known as:  METAGLIP  Take 1 tablet by mouth 2 (two) times daily before a meal.     l-methylfolate-B6-B12 3-35-2 MG Tabs  Commonly known as:  METANX  Take 1 tablet by mouth  daily.     lactulose 10 GM/15ML solution  Commonly known as:  CHRONULAC  Take 30 mLs (20 g total) by mouth 3 (three) times daily.     lidocaine-prilocaine cream  Commonly known as:  EMLA  Apply 1 application topically as needed. Apply quarter sized amount to portacath site at least one hour prior to chemo treatment     megestrol 400 MG/10ML suspension  Commonly known as:  MEGACE  Take 800 mg by mouth daily as needed (only uses when stomach is upset or feels stomach "needs" it).     multivitamin with minerals Tabs tablet  Take 1 tablet by mouth daily.     naloxegol oxalate 25 MG Tabs tablet  Commonly known as:  MOVANTIK  Take 1 tablet (25 mg total) by mouth daily.     traMADol 50 MG tablet  Commonly known as:  ULTRAM  Take 1-2 tablets (50-100 mg total) by mouth every 8 (eight) hours as needed for moderate pain.        Allergies: No Known Allergies  Past Medical History, Surgical history, Social history, and Family History were reviewed and updated.  Review of Systems: All other 10 point review of systems is negative.  Physical Exam:  height is 5\' 1"  (1.549 m) and weight is 126 lb (57.153 kg). Her oral temperature is 98.4 F (36.9 C). Her blood pressure is 172/93 and her pulse is 109. Her respiration is 16.   Wt Readings from Last 3 Encounters:  07/24/15 126 lb (57.153 kg)  07/04/15 128 lb (58.06 kg)  07/01/15 130 lb (58.968 kg)    Ocular: Sclerae unicteric, pupils equal, round and reactive to light Ear-nose-throat: Oropharynx clear, dentition fair Lymphatic: No cervical or supraclavicular adenopathy Lungs no rales or rhonchi, good excursion bilaterally Heart regular rate and rhythm, no murmur appreciated Abd soft, nontender, positive bowel sounds MSK no focal spinal tenderness, no joint edema Neuro: non-focal, well-oriented, appropriate affect Breasts: Deferred  Lab Results  Component Value Date   WBC 5.1 07/24/2015   HGB 12.6 07/24/2015   HCT 37.2  07/24/2015   MCV 72* 07/24/2015   PLT 267 07/24/2015   Lab Results  Component Value Date   FERRITIN 205 06/25/2014   IRON 44 06/25/2014   TIBC 301 06/25/2014   UIBC 257 06/25/2014   IRONPCTSAT 15* 06/25/2014   Lab Results  Component Value Date   RBC 5.20 07/24/2015   No results found for: KPAFRELGTCHN, LAMBDASER, KAPLAMBRATIO No results found for: IGGSERUM, IGA, IGMSERUM No results found for: Odetta Pink, SPEI   Chemistry      Component Value Date/Time   NA 137 07/24/2015 1004   NA 138 05/28/2015 1043   K 3.3 07/24/2015 1004   K 4.5 05/28/2015 1043   CL 107 07/24/2015 1004   CL 105 05/28/2015 1043   CO2 22 07/24/2015 1004   CO2 22 05/28/2015 1043   BUN 10 07/24/2015 1004   BUN 18 05/28/2015 1043   CREATININE 0.6 07/24/2015 1004   CREATININE 0.75 05/28/2015 1043      Component Value Date/Time   CALCIUM 9.5 07/24/2015 1004   CALCIUM 9.8 05/28/2015 1043   ALKPHOS 96* 07/24/2015 1004   ALKPHOS 97 05/28/2015 1043   AST 25 07/24/2015 1004   AST 23 05/28/2015 1043   ALT 21 07/24/2015 1004   ALT 22 05/28/2015 1043   BILITOT 0.40 07/24/2015 1004   BILITOT 0.6 05/28/2015 1043     Impression and Plan: Jacqueline Hahn is 69 year old female with metastatic adenocarcinoma of pancreas. She had some progression of disease and is now receiving FOLFOX.  She continues to have constipation, abdominal cramping and flatus. She saw GI and has now started Linzess and Dexilant dialy. She has also had some mid back pain and has been taking Aleve. We will have her stop taking the Aleve and try Ultram 50-100 mg q 8 hours PRN.   Her bilirubin and LFT's today are improved. She will keep her appointment with Dr. Benson Norway for October 2nd.  We will proceed with cycle 2 of treatment and plan to re scan her in October.  She has her current appointment/treatment schedule.  She knows to call here with any questions or concerns. We can certainly see her  sooner if need be.   Eliezer Bottom, NP 9/21/201610:49 AM

## 2015-07-24 NOTE — Patient Instructions (Signed)
[facility] Discharge Instructions for Patients Receiving Chemotherapy  Today you received the following chemotherapy agents Oxaliplatin, Leucovorin and 5FU.  To help prevent nausea and vomiting after your treatment, we encourage you to take your nausea medication.   If you develop nausea and vomiting that is not controlled by your nausea medication, call the clinic.   BELOW ARE SYMPTOMS THAT SHOULD BE REPORTED IMMEDIATELY:  *FEVER GREATER THAN 100.5 F  *CHILLS WITH OR WITHOUT FEVER  NAUSEA AND VOMITING THAT IS NOT CONTROLLED WITH YOUR NAUSEA MEDICATION  *UNUSUAL SHORTNESS OF BREATH  *UNUSUAL BRUISING OR BLEEDING  TENDERNESS IN MOUTH AND THROAT WITH OR WITHOUT PRESENCE OF ULCERS  *URINARY PROBLEMS  *BOWEL PROBLEMS  UNUSUAL RASH Items with * indicate a potential emergency and should be followed up as soon as possible.  Feel free to call the clinic you have any questions or concerns. The clinic phone number is [phone].  Please show the CHEMO ALERT CARD at check-in to the Emergency Department and triage nurse.   

## 2015-07-26 ENCOUNTER — Ambulatory Visit (HOSPITAL_BASED_OUTPATIENT_CLINIC_OR_DEPARTMENT_OTHER): Payer: Medicare Other

## 2015-07-26 VITALS — BP 129/84 | HR 103 | Temp 98.4°F | Resp 18

## 2015-07-26 DIAGNOSIS — C259 Malignant neoplasm of pancreas, unspecified: Secondary | ICD-10-CM

## 2015-07-26 DIAGNOSIS — C250 Malignant neoplasm of head of pancreas: Secondary | ICD-10-CM

## 2015-07-26 DIAGNOSIS — C7800 Secondary malignant neoplasm of unspecified lung: Secondary | ICD-10-CM

## 2015-07-26 DIAGNOSIS — K8689 Other specified diseases of pancreas: Secondary | ICD-10-CM

## 2015-07-26 LAB — PREALBUMIN: PREALBUMIN: 30 mg/dL (ref 17–34)

## 2015-07-26 LAB — CANCER ANTIGEN 19-9: CA 19-9: 20552.2 U/mL — ABNORMAL HIGH (ref <35.0)

## 2015-07-26 MED ORDER — HEPARIN SOD (PORK) LOCK FLUSH 100 UNIT/ML IV SOLN
500.0000 [IU] | Freq: Once | INTRAVENOUS | Status: AC | PRN
  Administered 2015-07-26: 500 [IU]
  Filled 2015-07-26: qty 5

## 2015-07-26 MED ORDER — SODIUM CHLORIDE 0.9 % IJ SOLN
10.0000 mL | INTRAMUSCULAR | Status: DC | PRN
  Administered 2015-07-26: 10 mL
  Filled 2015-07-26: qty 10

## 2015-07-26 NOTE — Patient Instructions (Signed)
5FU pump discontinue

## 2015-08-02 ENCOUNTER — Ambulatory Visit: Payer: Medicare Other | Admitting: Hematology & Oncology

## 2015-08-02 ENCOUNTER — Other Ambulatory Visit: Payer: Medicare Other

## 2015-08-07 ENCOUNTER — Telehealth: Payer: Self-pay | Admitting: *Deleted

## 2015-08-07 ENCOUNTER — Other Ambulatory Visit (HOSPITAL_BASED_OUTPATIENT_CLINIC_OR_DEPARTMENT_OTHER): Payer: Medicare Other

## 2015-08-07 ENCOUNTER — Ambulatory Visit (HOSPITAL_BASED_OUTPATIENT_CLINIC_OR_DEPARTMENT_OTHER): Payer: Medicare Other

## 2015-08-07 VITALS — BP 156/88 | HR 118 | Temp 98.4°F | Resp 16

## 2015-08-07 DIAGNOSIS — K5903 Drug induced constipation: Secondary | ICD-10-CM

## 2015-08-07 DIAGNOSIS — C250 Malignant neoplasm of head of pancreas: Secondary | ICD-10-CM | POA: Diagnosis not present

## 2015-08-07 DIAGNOSIS — C259 Malignant neoplasm of pancreas, unspecified: Secondary | ICD-10-CM

## 2015-08-07 DIAGNOSIS — Z5111 Encounter for antineoplastic chemotherapy: Secondary | ICD-10-CM | POA: Diagnosis not present

## 2015-08-07 DIAGNOSIS — C7800 Secondary malignant neoplasm of unspecified lung: Secondary | ICD-10-CM

## 2015-08-07 DIAGNOSIS — T402X5A Adverse effect of other opioids, initial encounter: Secondary | ICD-10-CM

## 2015-08-07 DIAGNOSIS — K8689 Other specified diseases of pancreas: Secondary | ICD-10-CM

## 2015-08-07 LAB — CBC WITH DIFFERENTIAL (CANCER CENTER ONLY)
BASO#: 0 10*3/uL (ref 0.0–0.2)
BASO%: 0.4 % (ref 0.0–2.0)
EOS%: 1.1 % (ref 0.0–7.0)
Eosinophils Absolute: 0.1 10*3/uL (ref 0.0–0.5)
HCT: 36.1 % (ref 34.8–46.6)
HEMOGLOBIN: 12.3 g/dL (ref 11.6–15.9)
LYMPH#: 1.7 10*3/uL (ref 0.9–3.3)
LYMPH%: 24 % (ref 14.0–48.0)
MCH: 24.1 pg — ABNORMAL LOW (ref 26.0–34.0)
MCHC: 34.1 g/dL (ref 32.0–36.0)
MCV: 71 fL — ABNORMAL LOW (ref 81–101)
MONO#: 1.4 10*3/uL — ABNORMAL HIGH (ref 0.1–0.9)
MONO%: 19.9 % — AB (ref 0.0–13.0)
NEUT%: 54.6 % (ref 39.6–80.0)
NEUTROS ABS: 3.9 10*3/uL (ref 1.5–6.5)
PLATELETS: 236 10*3/uL (ref 145–400)
RBC: 5.1 10*6/uL (ref 3.70–5.32)
RDW: 15.6 % (ref 11.1–15.7)
WBC: 7.2 10*3/uL (ref 3.9–10.0)

## 2015-08-07 LAB — CMP (CANCER CENTER ONLY)
ALT(SGPT): 24 U/L (ref 10–47)
AST: 33 U/L (ref 11–38)
Albumin: 3.6 g/dL (ref 3.3–5.5)
Alkaline Phosphatase: 94 U/L — ABNORMAL HIGH (ref 26–84)
BUN: 10 mg/dL (ref 7–22)
CHLORIDE: 109 meq/L — AB (ref 98–108)
CO2: 22 meq/L (ref 18–33)
CREATININE: 0.5 mg/dL — AB (ref 0.6–1.2)
Calcium: 9.4 mg/dL (ref 8.0–10.3)
GLUCOSE: 81 mg/dL (ref 73–118)
Potassium: 3 mEq/L — CL (ref 3.3–4.7)
SODIUM: 137 meq/L (ref 128–145)
TOTAL PROTEIN: 7.2 g/dL (ref 6.4–8.1)
Total Bilirubin: 0.4 mg/dl (ref 0.20–1.60)

## 2015-08-07 LAB — CANCER ANTIGEN 19-9: CA 19 9: 16208.9 U/mL — AB (ref <35.0)

## 2015-08-07 MED ORDER — SODIUM CHLORIDE 0.9 % IJ SOLN
10.0000 mL | INTRAMUSCULAR | Status: DC | PRN
  Filled 2015-08-07: qty 10

## 2015-08-07 MED ORDER — SODIUM CHLORIDE 0.9 % IV SOLN
Freq: Once | INTRAVENOUS | Status: AC
  Administered 2015-08-07: 10:00:00 via INTRAVENOUS
  Filled 2015-08-07: qty 4

## 2015-08-07 MED ORDER — HEPARIN SOD (PORK) LOCK FLUSH 100 UNIT/ML IV SOLN
500.0000 [IU] | Freq: Once | INTRAVENOUS | Status: DC | PRN
  Filled 2015-08-07: qty 5

## 2015-08-07 MED ORDER — DEXTROSE 5 % IV SOLN
Freq: Once | INTRAVENOUS | Status: AC
  Administered 2015-08-07: 08:00:00 via INTRAVENOUS

## 2015-08-07 MED ORDER — DEXTROSE 5 % IV SOLN
76.5000 mg/m2 | Freq: Once | INTRAVENOUS | Status: AC
  Administered 2015-08-07: 120 mg via INTRAVENOUS
  Filled 2015-08-07: qty 24

## 2015-08-07 MED ORDER — SODIUM CHLORIDE 0.9 % IV SOLN
2000.0000 mg/m2 | INTRAVENOUS | Status: DC
  Administered 2015-08-07: 3150 mg via INTRAVENOUS
  Filled 2015-08-07: qty 63

## 2015-08-07 MED ORDER — LEUCOVORIN CALCIUM INJECTION 350 MG
400.0000 mg/m2 | Freq: Once | INTRAVENOUS | Status: AC
  Administered 2015-08-07: 632 mg via INTRAVENOUS
  Filled 2015-08-07: qty 31.6

## 2015-08-07 MED ORDER — FLUOROURACIL CHEMO INJECTION 2.5 GM/50ML
400.0000 mg/m2 | Freq: Once | INTRAVENOUS | Status: AC
  Administered 2015-08-07: 650 mg via INTRAVENOUS
  Filled 2015-08-07: qty 13

## 2015-08-07 NOTE — Patient Instructions (Signed)
[facility] Discharge Instructions for Patients Receiving Chemotherapy  Today you received the following chemotherapy agents Oxaliplatin, Leucovorin and 5FU.  To help prevent nausea and vomiting after your treatment, we encourage you to take your nausea medication.   If you develop nausea and vomiting that is not controlled by your nausea medication, call the clinic.   BELOW ARE SYMPTOMS THAT SHOULD BE REPORTED IMMEDIATELY:  *FEVER GREATER THAN 100.5 F  *CHILLS WITH OR WITHOUT FEVER  NAUSEA AND VOMITING THAT IS NOT CONTROLLED WITH YOUR NAUSEA MEDICATION  *UNUSUAL SHORTNESS OF BREATH  *UNUSUAL BRUISING OR BLEEDING  TENDERNESS IN MOUTH AND THROAT WITH OR WITHOUT PRESENCE OF ULCERS  *URINARY PROBLEMS  *BOWEL PROBLEMS  UNUSUAL RASH Items with * indicate a potential emergency and should be followed up as soon as possible.  Feel free to call the clinic you have any questions or concerns. The clinic phone number is [phone].  Please show the CHEMO ALERT CARD at check-in to the Emergency Department and triage nurse.   

## 2015-08-07 NOTE — Telephone Encounter (Signed)
Critical Value Potassium 3.0 Dr Marin Olp notified, No orders at this time

## 2015-08-09 ENCOUNTER — Ambulatory Visit (HOSPITAL_BASED_OUTPATIENT_CLINIC_OR_DEPARTMENT_OTHER): Payer: Medicare Other

## 2015-08-09 VITALS — BP 152/89 | HR 121 | Temp 98.2°F | Resp 18

## 2015-08-09 DIAGNOSIS — K8689 Other specified diseases of pancreas: Secondary | ICD-10-CM

## 2015-08-09 DIAGNOSIS — C250 Malignant neoplasm of head of pancreas: Secondary | ICD-10-CM

## 2015-08-09 DIAGNOSIS — C259 Malignant neoplasm of pancreas, unspecified: Secondary | ICD-10-CM

## 2015-08-09 DIAGNOSIS — C7800 Secondary malignant neoplasm of unspecified lung: Principal | ICD-10-CM

## 2015-08-09 MED ORDER — SODIUM CHLORIDE 0.9 % IJ SOLN
10.0000 mL | INTRAMUSCULAR | Status: DC | PRN
  Administered 2015-08-09: 10 mL
  Filled 2015-08-09: qty 10

## 2015-08-09 MED ORDER — HEPARIN SOD (PORK) LOCK FLUSH 100 UNIT/ML IV SOLN
500.0000 [IU] | Freq: Once | INTRAVENOUS | Status: AC | PRN
  Administered 2015-08-09: 500 [IU]
  Filled 2015-08-09: qty 5

## 2015-08-09 NOTE — Patient Instructions (Signed)
[facility] Discharge Instructions for Patients Receiving Chemotherapy  Today you received the following chemotherapy agents 5-FU.  To help prevent nausea and vomiting after your treatment, we encourage you to take your nausea medication.   If you develop nausea and vomiting that is not controlled by your nausea medication, call the clinic.   BELOW ARE SYMPTOMS THAT SHOULD BE REPORTED IMMEDIATELY:  *FEVER GREATER THAN 100.5 F  *CHILLS WITH OR WITHOUT FEVER  NAUSEA AND VOMITING THAT IS NOT CONTROLLED WITH YOUR NAUSEA MEDICATION  *UNUSUAL SHORTNESS OF BREATH  *UNUSUAL BRUISING OR BLEEDING  TENDERNESS IN MOUTH AND THROAT WITH OR WITHOUT PRESENCE OF ULCERS  *URINARY PROBLEMS  *BOWEL PROBLEMS  UNUSUAL RASH Items with * indicate a potential emergency and should be followed up as soon as possible.  Feel free to call the clinic you have any questions or concerns. The clinic phone number is [phone].  Please show the CHEMO ALERT CARD at check-in to the Emergency Department and triage nurse.   

## 2015-08-20 ENCOUNTER — Other Ambulatory Visit: Payer: Self-pay | Admitting: *Deleted

## 2015-08-20 DIAGNOSIS — C259 Malignant neoplasm of pancreas, unspecified: Secondary | ICD-10-CM

## 2015-08-20 DIAGNOSIS — C7800 Secondary malignant neoplasm of unspecified lung: Principal | ICD-10-CM

## 2015-08-21 ENCOUNTER — Ambulatory Visit (HOSPITAL_BASED_OUTPATIENT_CLINIC_OR_DEPARTMENT_OTHER): Payer: Medicare Other

## 2015-08-21 ENCOUNTER — Ambulatory Visit (HOSPITAL_BASED_OUTPATIENT_CLINIC_OR_DEPARTMENT_OTHER): Payer: Medicare Other | Admitting: Hematology & Oncology

## 2015-08-21 ENCOUNTER — Encounter: Payer: Self-pay | Admitting: Hematology & Oncology

## 2015-08-21 ENCOUNTER — Other Ambulatory Visit (HOSPITAL_BASED_OUTPATIENT_CLINIC_OR_DEPARTMENT_OTHER): Payer: Medicare Other

## 2015-08-21 VITALS — BP 143/87 | HR 113 | Temp 98.1°F | Resp 18 | Ht 61.0 in | Wt 123.0 lb

## 2015-08-21 DIAGNOSIS — C250 Malignant neoplasm of head of pancreas: Secondary | ICD-10-CM

## 2015-08-21 DIAGNOSIS — C7800 Secondary malignant neoplasm of unspecified lung: Secondary | ICD-10-CM

## 2015-08-21 DIAGNOSIS — Z5111 Encounter for antineoplastic chemotherapy: Secondary | ICD-10-CM

## 2015-08-21 DIAGNOSIS — C259 Malignant neoplasm of pancreas, unspecified: Secondary | ICD-10-CM

## 2015-08-21 DIAGNOSIS — K8689 Other specified diseases of pancreas: Secondary | ICD-10-CM

## 2015-08-21 LAB — CBC WITH DIFFERENTIAL (CANCER CENTER ONLY)
BASO#: 0 10*3/uL (ref 0.0–0.2)
BASO%: 0.6 % (ref 0.0–2.0)
EOS%: 1.7 % (ref 0.0–7.0)
Eosinophils Absolute: 0.1 10*3/uL (ref 0.0–0.5)
HCT: 35.9 % (ref 34.8–46.6)
HGB: 12.1 g/dL (ref 11.6–15.9)
LYMPH#: 1.9 10*3/uL (ref 0.9–3.3)
LYMPH%: 35.5 % (ref 14.0–48.0)
MCH: 24.2 pg — AB (ref 26.0–34.0)
MCHC: 33.7 g/dL (ref 32.0–36.0)
MCV: 72 fL — ABNORMAL LOW (ref 81–101)
MONO#: 1.4 10*3/uL — ABNORMAL HIGH (ref 0.1–0.9)
MONO%: 26.9 % — AB (ref 0.0–13.0)
NEUT#: 1.9 10*3/uL (ref 1.5–6.5)
NEUT%: 35.3 % — ABNORMAL LOW (ref 39.6–80.0)
PLATELETS: 240 10*3/uL (ref 145–400)
RBC: 5 10*6/uL (ref 3.70–5.32)
RDW: 16.7 % — ABNORMAL HIGH (ref 11.1–15.7)
WBC: 5.2 10*3/uL (ref 3.9–10.0)

## 2015-08-21 LAB — CMP (CANCER CENTER ONLY)
ALBUMIN: 3.4 g/dL (ref 3.3–5.5)
ALT(SGPT): 34 U/L (ref 10–47)
AST: 43 U/L — ABNORMAL HIGH (ref 11–38)
Alkaline Phosphatase: 105 U/L — ABNORMAL HIGH (ref 26–84)
BUN, Bld: 16 mg/dL (ref 7–22)
CALCIUM: 9.7 mg/dL (ref 8.0–10.3)
CHLORIDE: 108 meq/L (ref 98–108)
CO2: 21 meq/L (ref 18–33)
Creat: 0.8 mg/dl (ref 0.6–1.2)
Glucose, Bld: 121 mg/dL — ABNORMAL HIGH (ref 73–118)
POTASSIUM: 3.5 meq/L (ref 3.3–4.7)
SODIUM: 138 meq/L (ref 128–145)
Total Bilirubin: 0.6 mg/dl (ref 0.20–1.60)
Total Protein: 7.1 g/dL (ref 6.4–8.1)

## 2015-08-21 MED ORDER — HEPARIN SOD (PORK) LOCK FLUSH 100 UNIT/ML IV SOLN
250.0000 [IU] | Freq: Once | INTRAVENOUS | Status: DC | PRN
  Filled 2015-08-21: qty 5

## 2015-08-21 MED ORDER — DEXTROSE 5 % IV SOLN
Freq: Once | INTRAVENOUS | Status: AC
  Administered 2015-08-21: 09:00:00 via INTRAVENOUS

## 2015-08-21 MED ORDER — LEUCOVORIN CALCIUM INJECTION 350 MG
400.0000 mg/m2 | Freq: Once | INTRAVENOUS | Status: AC
  Administered 2015-08-21: 632 mg via INTRAVENOUS
  Filled 2015-08-21: qty 31.6

## 2015-08-21 MED ORDER — ALTEPLASE 2 MG IJ SOLR
2.0000 mg | Freq: Once | INTRAMUSCULAR | Status: DC | PRN
  Filled 2015-08-21: qty 2

## 2015-08-21 MED ORDER — SODIUM CHLORIDE 0.9 % IJ SOLN
3.0000 mL | INTRAMUSCULAR | Status: DC | PRN
  Filled 2015-08-21: qty 10

## 2015-08-21 MED ORDER — SODIUM CHLORIDE 0.9 % IV SOLN
Freq: Once | INTRAVENOUS | Status: AC
  Administered 2015-08-21: 09:00:00 via INTRAVENOUS
  Filled 2015-08-21: qty 4

## 2015-08-21 MED ORDER — FLUOROURACIL CHEMO INJECTION 2.5 GM/50ML
400.0000 mg/m2 | Freq: Once | INTRAVENOUS | Status: AC
  Administered 2015-08-21: 650 mg via INTRAVENOUS
  Filled 2015-08-21: qty 13

## 2015-08-21 MED ORDER — SODIUM CHLORIDE 0.9 % IV SOLN
2000.0000 mg/m2 | INTRAVENOUS | Status: DC
  Administered 2015-08-21: 3150 mg via INTRAVENOUS
  Filled 2015-08-21: qty 63

## 2015-08-21 MED ORDER — HEPARIN SOD (PORK) LOCK FLUSH 100 UNIT/ML IV SOLN
500.0000 [IU] | Freq: Once | INTRAVENOUS | Status: DC | PRN
  Filled 2015-08-21: qty 5

## 2015-08-21 MED ORDER — SODIUM CHLORIDE 0.9 % IJ SOLN
10.0000 mL | INTRAMUSCULAR | Status: DC | PRN
  Filled 2015-08-21: qty 10

## 2015-08-21 MED ORDER — OXALIPLATIN CHEMO INJECTION 100 MG/20ML
76.5000 mg/m2 | Freq: Once | INTRAVENOUS | Status: AC
  Administered 2015-08-21: 120 mg via INTRAVENOUS
  Filled 2015-08-21: qty 10

## 2015-08-21 NOTE — Patient Instructions (Signed)

## 2015-08-21 NOTE — Progress Notes (Signed)
Hematology and Oncology Follow Up Visit  Jacqueline Hahn [id number] [birth year] 69 y.o. 08/21/2015   Principle Diagnosis:  Metastatic adenocarcinoma of the pancreas  Current Therapy:   FOLFOX q 14 days s/p cycle 3     Interim History: Jacqueline Hahn is here today for follow-up . She was pretty good. She does feel a little tired after the first 3 or 4 days of treatment.  She is not complaining of pain. There is no nausea or vomiting. There is no change in bowel or bladder habits. She problems with constipation. However, this seems to be better with Linnzess and MiraLAX.  Her last CA 19-9 was down to 16,200. Hold, we'll see it continued to come down.  She's had no rashes. She's had no itching.  She did see her gastroenterologist. He did not think anything had to done with the biliary stent.  She has had no problems with leg swelling.  She's had no bleeding.   Overall, her performance status is ECOG 1..   Medications:    Medication List       This list is accurate as of: 08/21/15  8:52 AM.  Always use your most recent med list.               amLODipine 10 MG tablet  Commonly known as:  NORVASC  TAKE 1 TABLET (10 MG TOTAL) BY MOUTH DAILY.     CALCIUM + D PO  Take 1 tablet by mouth daily.     FLAX PO  Take 1 tablet by mouth daily.     GENERLAC 10 GM/15ML Soln  Generic drug:  lactulose (encephalopathy)     glipiZIDE-metformin 5-500 MG tablet  Commonly known as:  METAGLIP  Take 1 tablet by mouth 2 (two) times daily before a meal.     l-methylfolate-B6-B12 3-35-2 MG Tabs tablet  Commonly known as:  METANX  Take 1 tablet by mouth daily.     lactulose 10 GM/15ML solution  Commonly known as:  CHRONULAC  Take 30 mLs (20 g total) by mouth 3 (three) times daily.     lidocaine-prilocaine cream  Commonly known as:  EMLA  Apply 1 application topically as needed. Apply quarter sized amount to portacath site at least one hour prior to chemo treatment     LINZESS 145 MCG Caps  capsule  Generic drug:  Linaclotide     megestrol 400 MG/10ML suspension  Commonly known as:  MEGACE  Take 800 mg by mouth daily as needed (only uses when stomach is upset or feels stomach "needs" it).     multivitamin with minerals Tabs tablet  Take 1 tablet by mouth daily.     naloxegol oxalate 25 MG Tabs tablet  Commonly known as:  MOVANTIK  Take 1 tablet (25 mg total) by mouth daily.     traMADol 50 MG tablet  Commonly known as:  ULTRAM  Take 1-2 tablets (50-100 mg total) by mouth every 8 (eight) hours as needed for moderate pain.        Allergies: No Known Allergies  Past Medical History, Surgical history, Social history, and Family History were reviewed and updated.  Review of Systems: All other 10 point review of systems is negative.   Physical Exam:  height is 5\' 1"  (1.549 m) and weight is 123 lb (55.792 kg). Her oral temperature is 98.1 F (36.7 C). Her blood pressure is 143/87 and her pulse is 113. Her respiration is 18.   Wt Readings from Last 3 Encounters:  08/21/15 123 lb (55.792 kg)  07/24/15 126 lb (57.153 kg)  07/04/15 128 lb (58.06 kg)    Ocular: Sclerae anicteric, pupils equal, round and reactive to light Ear-nose-throat: Oropharynx clear, dentition fair Lymphatic: No cervical or supraclavicular adenopathy Lungs no rales or rhonchi, good excursion bilaterally Heart regular rate and rhythm, no murmur appreciated Abd soft, nontender, positive bowel sounds MSK no focal spinal tenderness, no joint edema Neuro: non-focal, well-oriented, appropriate affect Breasts: Deferred  Lab Results  Component Value Date   WBC 5.2 08/21/2015   HGB 12.1 08/21/2015   HCT 35.9 08/21/2015   MCV 72* 08/21/2015   PLT 240 08/21/2015   Lab Results  Component Value Date   FERRITIN 205 06/25/2014   IRON 44 06/25/2014   TIBC 301 06/25/2014   UIBC 257 06/25/2014   IRONPCTSAT 15* 06/25/2014   Lab Results  Component Value Date   RBC 5.00 08/21/2015   No results  found for: KPAFRELGTCHN, LAMBDASER, KAPLAMBRATIO No results found for: Kandis Cocking, IGMSERUM No results found for: Odetta Pink, SPEI   Chemistry      Component Value Date/Time   NA 137 08/07/2015 0805   NA 138 05/28/2015 1043   K 3.0* 08/07/2015 0805   K 4.5 05/28/2015 1043   CL 109* 08/07/2015 0805   CL 105 05/28/2015 1043   CO2 22 08/07/2015 0805   CO2 22 05/28/2015 1043   BUN 10 08/07/2015 0805   BUN 18 05/28/2015 1043   CREATININE 0.5* 08/07/2015 0805   CREATININE 0.75 05/28/2015 1043      Component Value Date/Time   CALCIUM 9.4 08/07/2015 0805   CALCIUM 9.8 05/28/2015 1043   ALKPHOS 94* 08/07/2015 0805   ALKPHOS 97 05/28/2015 1043   AST 33 08/07/2015 0805   AST 23 05/28/2015 1043   ALT 24 08/07/2015 0805   ALT 22 05/28/2015 1043   BILITOT 0.40 08/07/2015 0805   BILITOT 0.6 05/28/2015 1043     Impression and Plan: Jacqueline Hahn is a 69 year old African-American female. She is metastatic adenocarcinoma of the pancreas.  We did have to treat her with radiation and Xeloda to help with biliary obstruction. This worked quite nicely.  I will set her up with another set of scans. We'll see how things look.  We will plan to get her back in 3 weeks for her next cycle. Over, we will find that her cancer is shrinking. Hopefully, the CA 19-9 will continue to improve.    [name], MD 10/19/20168:52 AM

## 2015-08-23 ENCOUNTER — Ambulatory Visit (HOSPITAL_BASED_OUTPATIENT_CLINIC_OR_DEPARTMENT_OTHER): Payer: Medicare Other

## 2015-08-23 VITALS — BP 147/78 | HR 121 | Temp 98.5°F | Resp 16

## 2015-08-23 DIAGNOSIS — C7800 Secondary malignant neoplasm of unspecified lung: Secondary | ICD-10-CM

## 2015-08-23 DIAGNOSIS — C259 Malignant neoplasm of pancreas, unspecified: Secondary | ICD-10-CM

## 2015-08-23 DIAGNOSIS — K8689 Other specified diseases of pancreas: Secondary | ICD-10-CM

## 2015-08-23 DIAGNOSIS — C250 Malignant neoplasm of head of pancreas: Secondary | ICD-10-CM

## 2015-08-23 MED ORDER — SODIUM CHLORIDE 0.9 % IJ SOLN
10.0000 mL | INTRAMUSCULAR | Status: DC | PRN
  Administered 2015-08-23: 10 mL
  Filled 2015-08-23: qty 10

## 2015-08-23 MED ORDER — HEPARIN SOD (PORK) LOCK FLUSH 100 UNIT/ML IV SOLN
500.0000 [IU] | Freq: Once | INTRAVENOUS | Status: AC | PRN
  Administered 2015-08-23: 500 [IU]
  Filled 2015-08-23: qty 5

## 2015-08-23 NOTE — Patient Instructions (Signed)
Implanted Port Home Guide An implanted port is a type of central line that is placed under the skin. Central lines are used to provide IV access when treatment or nutrition needs to be given through a person's veins. Implanted ports are used for long-term IV access. An implanted port may be placed because:   You need IV medicine that would be irritating to the small veins in your hands or arms.   You need long-term IV medicines, such as antibiotics.   You need IV nutrition for a long period.   You need frequent blood draws for lab tests.   You need dialysis.  Implanted ports are usually placed in the chest area, but they can also be placed in the upper arm, the abdomen, or the leg. An implanted port has two main parts:   Reservoir. The reservoir is round and will appear as a small, raised area under your skin. The reservoir is the part where a needle is inserted to give medicines or draw blood.   Catheter. The catheter is a thin, flexible tube that extends from the reservoir. The catheter is placed into a large vein. Medicine that is inserted into the reservoir goes into the catheter and then into the vein.  HOW WILL I CARE FOR MY INCISION SITE? Do not get the incision site wet. Bathe or shower as directed by your health care provider.  HOW IS MY PORT ACCESSED? Special steps must be taken to access the port:   Before the port is accessed, a numbing cream can be placed on the skin. This helps numb the skin over the port site.   Your health care provider uses a sterile technique to access the port.  Your health care provider must put on a mask and sterile gloves.  The skin over your port is cleaned carefully with an antiseptic and allowed to dry.  The port is gently pinched between sterile gloves, and a needle is inserted into the port.  Only "non-coring" port needles should be used to access the port. Once the port is accessed, a blood return should be checked. This helps  ensure that the port is in the vein and is not clogged.   If your port needs to remain accessed for a constant infusion, a clear (transparent) bandage will be placed over the needle site. The bandage and needle will need to be changed every week, or as directed by your health care provider.   Keep the bandage covering the needle clean and dry. Do not get it wet. Follow your health care provider's instructions on how to take a shower or bath while the port is accessed.   If your port does not need to stay accessed, no bandage is needed over the port.  WHAT IS FLUSHING? Flushing helps keep the port from getting clogged. Follow your health care provider's instructions on how and when to flush the port. Ports are usually flushed with saline solution or a medicine called heparin. The need for flushing will depend on how the port is used.   If the port is used for intermittent medicines or blood draws, the port will need to be flushed:   After medicines have been given.   After blood has been drawn.   As part of routine maintenance.   If a constant infusion is running, the port may not need to be flushed.  HOW LONG WILL MY PORT STAY IMPLANTED? The port can stay in for as long as your health care   provider thinks it is needed. When it is time for the port to come out, surgery will be done to remove it. The procedure is similar to the one performed when the port was put in.  WHEN SHOULD I SEEK IMMEDIATE MEDICAL CARE? When you have an implanted port, you should seek immediate medical care if:   You notice a bad smell coming from the incision site.   You have swelling, redness, or drainage at the incision site.   You have more swelling or pain at the port site or the surrounding area.   You have a fever that is not controlled with medicine.   This information is not intended to replace advice given to you by your health care provider. Make sure you discuss any questions you have with  your health care provider.   Document Released: 10/19/2005 Document Revised: 08/09/2013 Document Reviewed: 06/26/2013 Elsevier Interactive Patient Education 2016 [employer] Fluorouracil, 5-FU injection What is this medicine? FLUOROURACIL, 5-FU (flure oh YOOR a sil) is a chemotherapy drug. It slows the growth of cancer cells. This medicine is used to treat many types of cancer like breast cancer, colon or rectal cancer, pancreatic cancer, and stomach cancer. This medicine may be used for other purposes; ask your health care provider or pharmacist if you have questions. What should I tell my health care provider before I take this medicine? They need to know if you have any of these conditions: -blood disorders -dihydropyrimidine dehydrogenase (DPD) deficiency -infection (especially a virus infection such as chickenpox, cold sores, or herpes) -kidney disease -liver disease -malnourished, poor nutrition -recent or ongoing radiation therapy -an unusual or allergic reaction to fluorouracil, other chemotherapy, other medicines, foods, dyes, or preservatives -pregnant or trying to get pregnant -breast-feeding How should I use this medicine? This drug is given as an infusion or injection into a vein. It is administered in a hospital or clinic by a specially trained health care professional. Talk to your pediatrician regarding the use of this medicine in children. Special care may be needed. Overdosage: If you think you have taken too much of this medicine contact a poison control center or emergency room at once. NOTE: This medicine is only for you. Do not share this medicine with others. What if I miss a dose? It is important not to miss your dose. Call your doctor or health care professional if you are unable to keep an appointment. What may interact with this medicine? -allopurinol -cimetidine -dapsone -digoxin -hydroxyurea -leucovorin -levamisole -medicines for seizures like  ethotoin, fosphenytoin, phenytoin -medicines to increase blood counts like filgrastim, pegfilgrastim, sargramostim -medicines that treat or prevent blood clots like warfarin, enoxaparin, and dalteparin -methotrexate -metronidazole -pyrimethamine -some other chemotherapy drugs like busulfan, cisplatin, estramustine, vinblastine -trimethoprim -trimetrexate -vaccines Talk to your doctor or health care professional before taking any of these medicines: -acetaminophen -aspirin -ibuprofen -ketoprofen -naproxen This list may not describe all possible interactions. Give your health care provider a list of all the medicines, herbs, non-prescription drugs, or dietary supplements you use. Also tell them if you smoke, drink alcohol, or use illegal drugs. Some items may interact with your medicine. What should I watch for while using this medicine? Visit your doctor for checks on your progress. This drug may make you feel generally unwell. This is not uncommon, as chemotherapy can affect healthy cells as well as cancer cells. Report any side effects. Continue your course of treatment even though you feel ill unless your doctor tells you to stop. In  some cases, you may be given additional medicines to help with side effects. Follow all directions for their use. Call your doctor or health care professional for advice if you get a fever, chills or sore throat, or other symptoms of a cold or flu. Do not treat yourself. This drug decreases your body's ability to fight infections. Try to avoid being around people who are sick. This medicine may increase your risk to bruise or bleed. Call your doctor or health care professional if you notice any unusual bleeding. Be careful brushing and flossing your teeth or using a toothpick because you may get an infection or bleed more easily. If you have any dental work done, tell your dentist you are receiving this medicine. Avoid taking products that contain aspirin,  acetaminophen, ibuprofen, naproxen, or ketoprofen unless instructed by your doctor. These medicines may hide a fever. Do not become pregnant while taking this medicine. Women should inform their doctor if they wish to become pregnant or think they might be pregnant. There is a potential for serious side effects to an unborn child. Talk to your health care professional or pharmacist for more information. Do not breast-feed an infant while taking this medicine. Men should inform their doctor if they wish to father a child. This medicine may lower sperm counts. Do not treat diarrhea with over the counter products. Contact your doctor if you have diarrhea that lasts more than 2 days or if it is severe and watery. This medicine can make you more sensitive to the sun. Keep out of the sun. If you cannot avoid being in the sun, wear protective clothing and use sunscreen. Do not use sun lamps or tanning beds/booths. What side effects may I notice from receiving this medicine? Side effects that you should report to your doctor or health care professional as soon as possible: -allergic reactions like skin rash, itching or hives, swelling of the face, lips, or tongue -low blood counts - this medicine may decrease the number of white blood cells, red blood cells and platelets. You may be at increased risk for infections and bleeding. -signs of infection - fever or chills, cough, sore throat, pain or difficulty passing urine -signs of decreased platelets or bleeding - bruising, pinpoint red spots on the skin, black, tarry stools, blood in the urine -signs of decreased red blood cells - unusually weak or tired, fainting spells, lightheadedness -breathing problems -changes in vision -chest pain -mouth sores -nausea and vomiting -pain, swelling, redness at site where injected -pain, tingling, numbness in the hands or feet -redness, swelling, or sores on hands or feet -stomach pain -unusual bleeding Side effects  that usually do not require medical attention (report to your doctor or health care professional if they continue or are bothersome): -changes in finger or toe nails -diarrhea -dry or itchy skin -hair loss -headache -loss of appetite -sensitivity of eyes to the light -stomach upset -unusually teary eyes This list may not describe all possible side effects. Call your doctor for medical advice about side effects. You may report side effects to FDA at 1-800-FDA-1088. Where should I keep my medicine? This drug is given in a hospital or clinic and will not be stored at home. NOTE: This sheet is a summary. It may not cover all possible information. If you have questions about this medicine, talk to your doctor, pharmacist, or health care provider.    2016, Elsevier/Gold Standard. (2008-02-22 13:53:16)

## 2015-09-04 ENCOUNTER — Ambulatory Visit (HOSPITAL_BASED_OUTPATIENT_CLINIC_OR_DEPARTMENT_OTHER): Payer: Medicare Other

## 2015-09-04 ENCOUNTER — Other Ambulatory Visit (HOSPITAL_BASED_OUTPATIENT_CLINIC_OR_DEPARTMENT_OTHER): Payer: Medicare Other

## 2015-09-04 ENCOUNTER — Ambulatory Visit (HOSPITAL_BASED_OUTPATIENT_CLINIC_OR_DEPARTMENT_OTHER)
Admission: RE | Admit: 2015-09-04 | Discharge: 2015-09-04 | Disposition: A | Discharge status: Home / Self Care | Payer: Medicare Other | Source: Ambulatory Visit | Attending: Hematology & Oncology | Admitting: Hematology & Oncology

## 2015-09-04 ENCOUNTER — Telehealth: Payer: Self-pay | Admitting: *Deleted

## 2015-09-04 ENCOUNTER — Encounter (HOSPITAL_BASED_OUTPATIENT_CLINIC_OR_DEPARTMENT_OTHER): Payer: Self-pay

## 2015-09-04 DIAGNOSIS — K760 Fatty (change of) liver, not elsewhere classified: Secondary | ICD-10-CM | POA: Diagnosis not present

## 2015-09-04 DIAGNOSIS — C250 Malignant neoplasm of head of pancreas: Secondary | ICD-10-CM

## 2015-09-04 DIAGNOSIS — C259 Malignant neoplasm of pancreas, unspecified: Secondary | ICD-10-CM

## 2015-09-04 DIAGNOSIS — C7800 Secondary malignant neoplasm of unspecified lung: Secondary | ICD-10-CM | POA: Diagnosis not present

## 2015-09-04 DIAGNOSIS — C7951 Secondary malignant neoplasm of bone: Secondary | ICD-10-CM | POA: Diagnosis not present

## 2015-09-04 DIAGNOSIS — K7689 Other specified diseases of liver: Secondary | ICD-10-CM | POA: Diagnosis not present

## 2015-09-04 DIAGNOSIS — R590 Localized enlarged lymph nodes: Secondary | ICD-10-CM | POA: Diagnosis not present

## 2015-09-04 DIAGNOSIS — Z452 Encounter for adjustment and management of vascular access device: Secondary | ICD-10-CM | POA: Diagnosis not present

## 2015-09-04 MED ORDER — IOHEXOL 300 MG/ML  SOLN
100.0000 mL | Freq: Once | INTRAMUSCULAR | Status: AC | PRN
  Administered 2015-09-04: 100 mL via INTRAVENOUS

## 2015-09-04 MED ORDER — HEPARIN SOD (PORK) LOCK FLUSH 100 UNIT/ML IV SOLN
500.0000 [IU] | Freq: Once | INTRAVENOUS | Status: AC
  Administered 2015-09-04: 500 [IU] via INTRAVENOUS
  Filled 2015-09-04: qty 5

## 2015-09-04 MED ORDER — SODIUM CHLORIDE 0.9 % IJ SOLN
10.0000 mL | INTRAMUSCULAR | Status: DC | PRN
  Administered 2015-09-04: 10 mL via INTRAVENOUS
  Filled 2015-09-04: qty 10

## 2015-09-04 NOTE — Patient Instructions (Signed)

## 2015-09-04 NOTE — Telephone Encounter (Addendum)
Patient aware of results.   ----- Message from Jacqueline Napoleon, MD sent at 09/04/2015 11:05 AM EDT ----- Call - cancer is better in the lungs!!!!  Her pancreatic tumor is stable and NOT getting larger!!!  [name]!!!!  [name]

## 2015-09-04 NOTE — Progress Notes (Signed)
Sherril Cong presented for Portacath access and flush. Proper placement of portacath confirmed by CXR. Portacath located in the right chest wall accessed with  H 20 needle. Clean, Dry and Intact Good blood return present. Portacath flushed with 41ml NS and 500U/57ml Heparin per protocol and needle removed intact. Procedure without incident. Patient tolerated procedure well.

## 2015-09-07 IMAGING — CT CT CHEST W/ CM
4 of 5 series · 9 of 46 positions shown, 13 images · IV contrast (APPLIED)
Comparison: 03/07/2015 CT of the chest, abdomen and pelvis.

CLINICAL DATA: 69-year-old female presenting for follow-up of
metastatic pancreatic cancer, status post chemotherapy and radiation
therapy. Common bile duct stent placement in [REDACTED]. Abdominal pain
for 3 days.

EXAM:
CT CHEST, ABDOMEN, AND PELVIS WITH CONTRAST
TECHNIQUE: Multidetector CT imaging of the chest, abdomen and pelvis was
performed following the standard protocol during bolus
administration of intravenous contrast.
CONTRAST:  100mL OMNIPAQUE IOHEXOL 300 MG/ML  SOLN

[Series 2: chest/abd/pel 5.0 b31f · axial · 0.69mm/px · z∈[+682,+752]mm · 2 of 123 slices shown]
[im 14/123  soft-tissue]
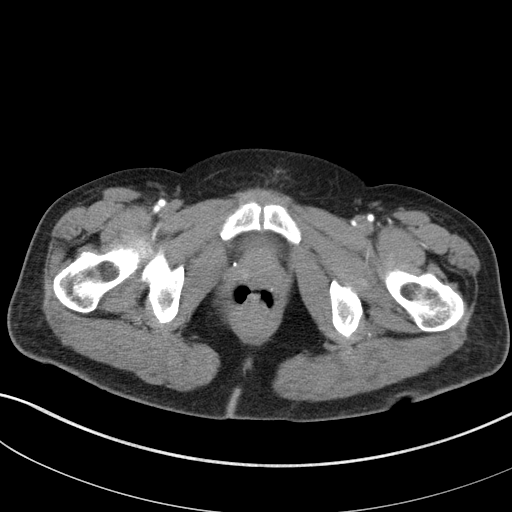
[im 28/123  soft-tissue]
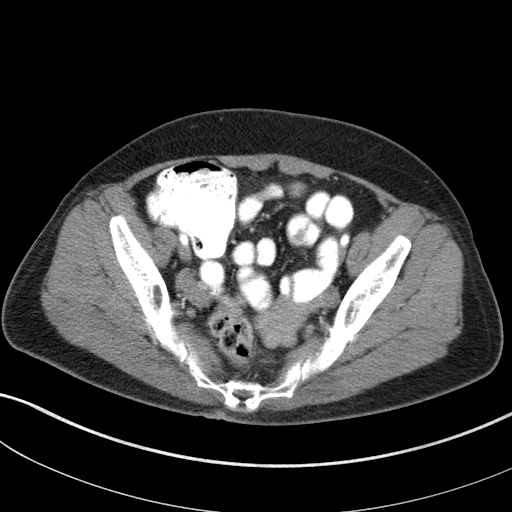

[Series 5: chest/abd/pel 3.0 coronal · coronal · 0.80mm/px · 3 of 82 slices shown]
[im 28/82  soft-tissue]
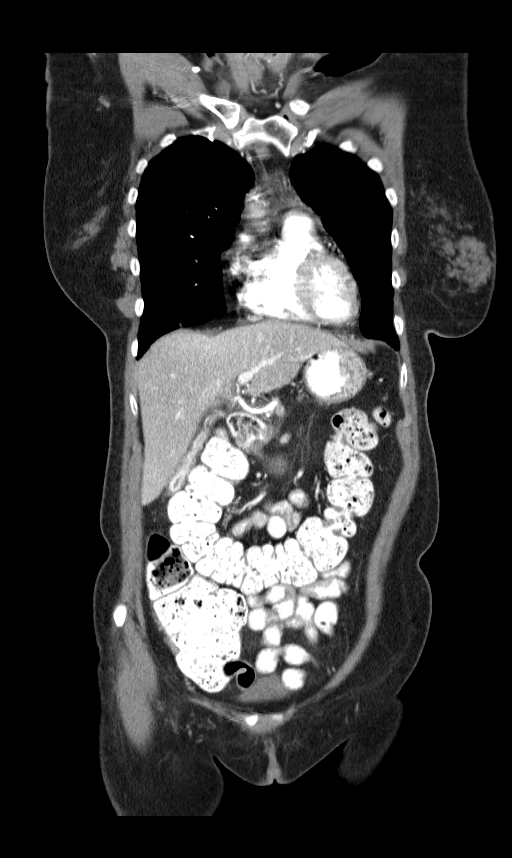
[im 37/82  soft-tissue]
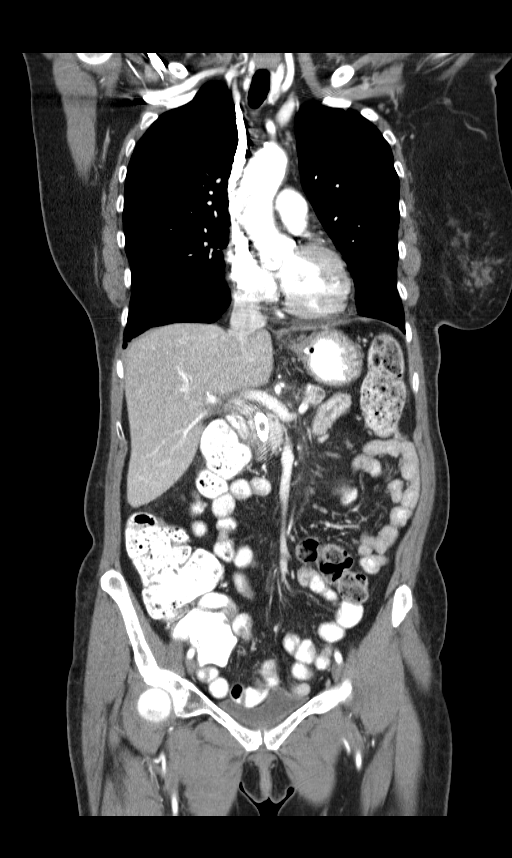
[im 46/82  soft-tissue]
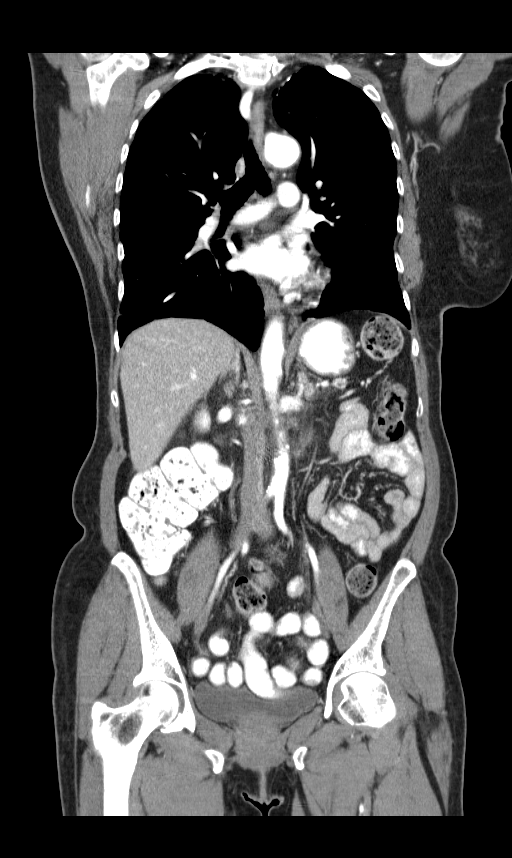

[Series 6: chest/abd/pel 3.0 sagittal · sagittal · 0.61mm/px · 1 of 124 slices shown]
[im 42/124  soft-tissue]
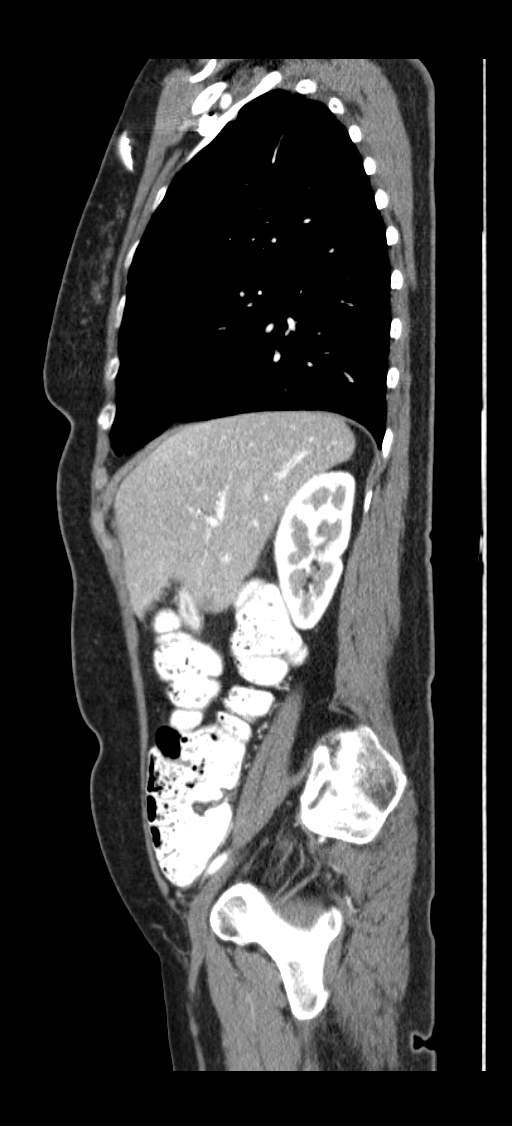

[Series 7: renal delay 5.0 b30f · axial · delayed · 0.61mm/px · z∈[+900,+970]mm · 3 of 29 slices shown, 7 images]
[im 8/29  soft-tissue]
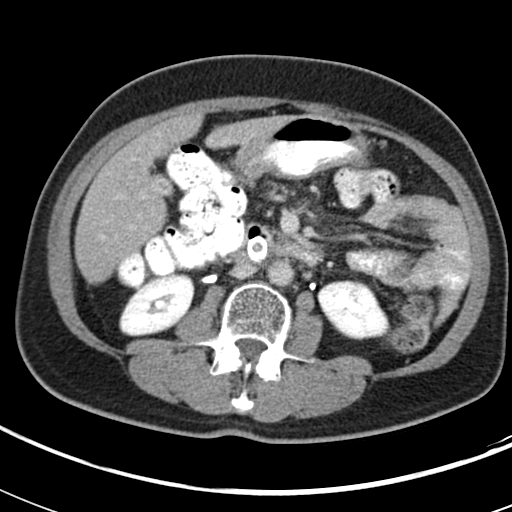
[im 8/29  lung]
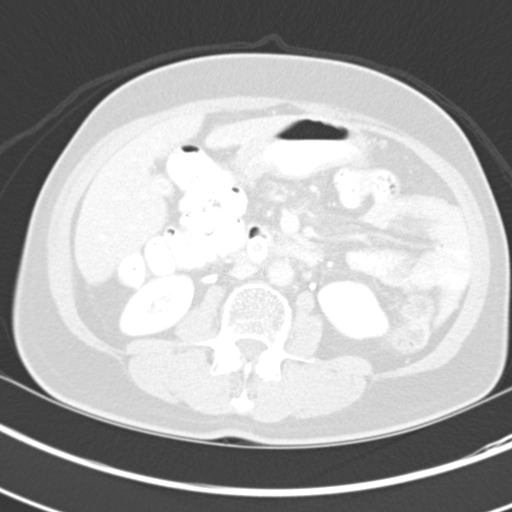
[im 8/29  bone]
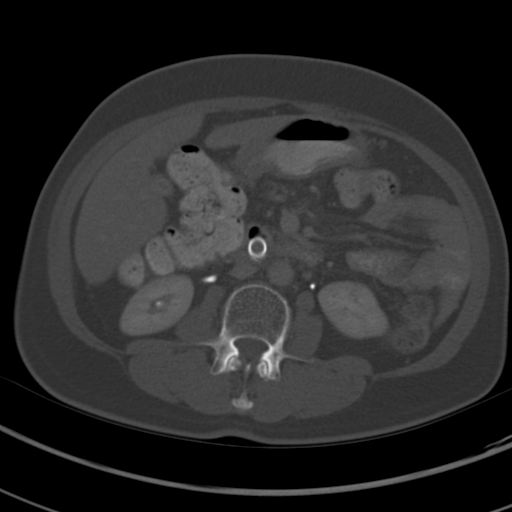
[im 15/29  soft-tissue]
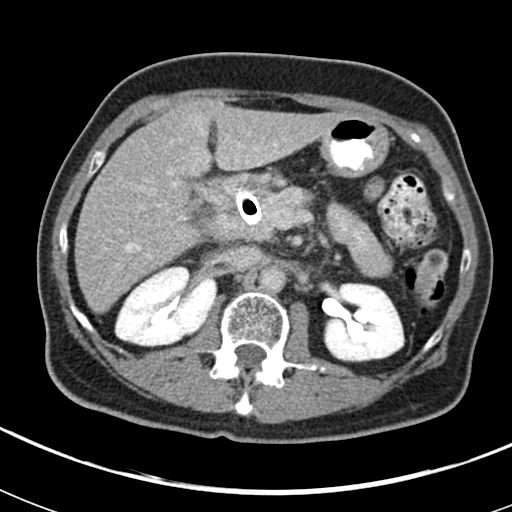
[im 15/29  lung]
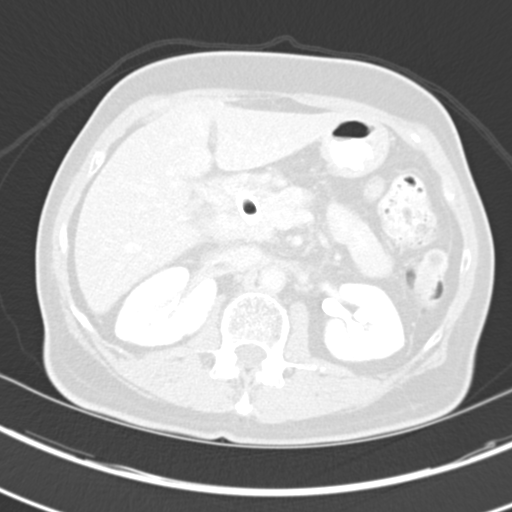
[im 22/29  soft-tissue]
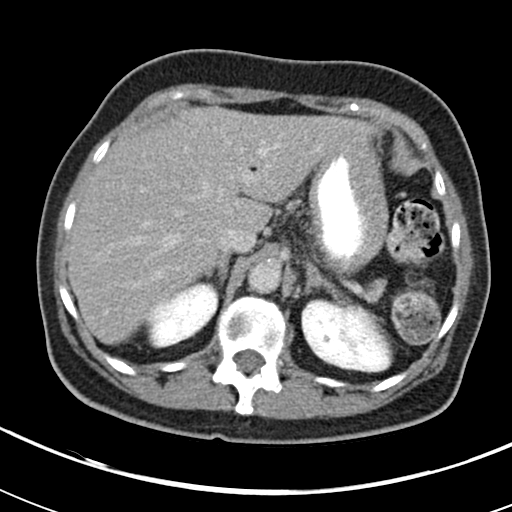
[im 22/29  lung]
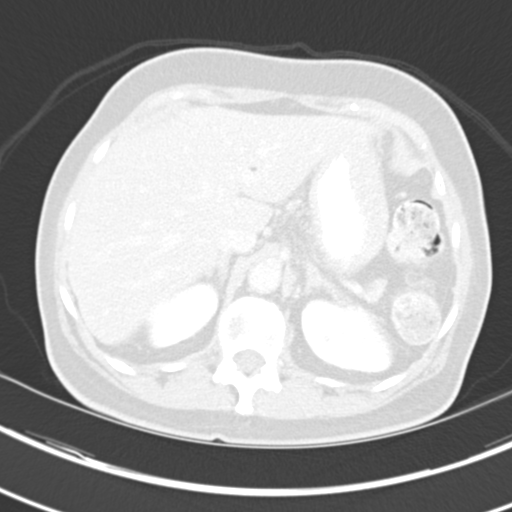

[9 of 46 positions shown; findings below may reference images not displayed]

FINDINGS: CT CHEST FINDINGS

Mediastinum/Nodes: Normal heart size. No pericardial
fluid/thickening. Right internal jugular MediPort terminates in the
lower third of the superior vena cava. Re- demonstrated is an
aberrant right subclavian artery arising from the distal aortic arch
and traversing posterior to the esophagus, with no associated
aneurysm. Nonaneurysmal thoracic aorta. Normal caliber main
pulmonary artery. No central pulmonary emboli. Stable hypodense
cm posterior right thyroid lobe nodule. Normal esophagus. No
axillary, mediastinal or hilar lymphadenopathy. There is triangular
soft tissue in the anterior mediastinum with internal interspersed
fat density, (series 2/ image 23) in keeping with mild thymic
rebound hyperplasia.

Lungs/Pleura: There are innumerable (> than 50) randomly distributed
pulmonary nodules throughout both lungs, which are increased in size
and number, in keeping with progressive pulmonary metastases. For
example a 1.0 cm posterior right upper lobe nodule (series 4/ image
10) is increased from 0.5 cm. A 1.0 cm posterior right lower lobe
nodule (4/35) is increased from 0.6 cm. A 1.1 cm right lower lobe
nodule (4/38) is increased from 0.8 cm. 82.3 cm left lower lobe
nodule (4/33) is increased from 0.9 cm. No pneumothorax. No pleural
effusion.

Musculoskeletal: Stable sclerotic lesion in the posterior right T10
vertebral body. No new aggressive appearing osseous lesions in the
chest.

CT ABDOMEN AND PELVIS FINDINGS

Hepatobiliary: Stable heterogeneously enhancing 2.5 cm liver mass in
segment 4B, previously characterized as a benign hemangioma on
06/22/2014 CT abdomen study. Otherwise normal liver. Contracted and
otherwise grossly normal gallbladder. Interval placement of a distal
common bile duct stent, which appears well positioned. There is
expected scattered pneumobilia within the non dilated intrahepatic
bile ducts.

Pancreas: There is a 1.8 x 1.6 cm hypodense mass in the pancreatic
body (series 2/ image 57), slightly decreased from 2.2 x 1.7 cm on
04/19/2015. There is stable main pancreatic duct dilation and
pancreatic parenchymal atrophy within the distal pancreatic body and
pancreatic tail.

Spleen: Normal.

Adrenals/Urinary Tract: Stable bilateral subcentimeter adrenal
nodules measuring 0.7 cm on the right (2/55) and 0.8 cm on the left
(2/55). Normal kidneys with no hydronephrosis and no renal mass. On
delayed imaging, there is no urothelial wall thickening and there
are no filling defects in the bilateral opacified portions of the
collecting systems or visualized proximal ureters.

Stomach/Bowel: Grossly normal stomach. Normal caliber small bowel,
with no small bowel wall thickening. Normal appendix. Normal large
bowel.

Vascular/Lymphatic: Atherosclerotic nonaneurysmal abdominal aorta.
There is a stable 1.1 cm short axis central mesenteric node (2/69).
A 1.7 cm short axis portacaval node (2/50) is decreased from 2.2 cm.
The previously described 1.0 cm short axis peripancreatic node is
decreased to 0.5 cm (2/59). Additional subcentimeter gastrohepatic
ligament nodes appear slightly decreased in size. There is a new
cm short axis peritoneal nodule anterior to the liver (2/58) .

Reproductive: Stable appearance of the anteverted uterus, with
likely small posterior uterine fibroids. No abnormal adnexal mass.

Other: No pneumoperitoneum, ascites or focal fluid collection.

Musculoskeletal: No aggressive appearing focal osseous lesions.
IMPRESSION: 1. Interval progression of extensive pulmonary metastases.
2. Mixed response in the abdomen, with decreased primary pancreatic
body neoplasm, decreased retroperitoneal and central mesenteric
lymphadenopathy, and a new perihepatic peritoneal tumor nodule.
3. Stable sclerotic T10 vertebral body metastasis.
4. Well-positioned distal common bile duct stent, with no
intrahepatic biliary ductal dilatation.

## 2015-09-10 ENCOUNTER — Other Ambulatory Visit (HOSPITAL_BASED_OUTPATIENT_CLINIC_OR_DEPARTMENT_OTHER): Payer: Medicare Other

## 2015-09-10 ENCOUNTER — Encounter: Payer: Self-pay | Admitting: Hematology & Oncology

## 2015-09-10 ENCOUNTER — Ambulatory Visit (HOSPITAL_BASED_OUTPATIENT_CLINIC_OR_DEPARTMENT_OTHER): Payer: Medicare Other | Admitting: Hematology & Oncology

## 2015-09-10 ENCOUNTER — Ambulatory Visit (HOSPITAL_BASED_OUTPATIENT_CLINIC_OR_DEPARTMENT_OTHER): Payer: Medicare Other

## 2015-09-10 VITALS — BP 171/86 | HR 114 | Temp 97.9°F | Resp 16 | Ht 61.0 in | Wt 126.0 lb

## 2015-09-10 DIAGNOSIS — C250 Malignant neoplasm of head of pancreas: Secondary | ICD-10-CM

## 2015-09-10 DIAGNOSIS — C7800 Secondary malignant neoplasm of unspecified lung: Secondary | ICD-10-CM

## 2015-09-10 DIAGNOSIS — K8689 Other specified diseases of pancreas: Secondary | ICD-10-CM

## 2015-09-10 DIAGNOSIS — Z5111 Encounter for antineoplastic chemotherapy: Secondary | ICD-10-CM | POA: Diagnosis not present

## 2015-09-10 DIAGNOSIS — C259 Malignant neoplasm of pancreas, unspecified: Secondary | ICD-10-CM

## 2015-09-10 LAB — CBC WITH DIFFERENTIAL (CANCER CENTER ONLY)
BASO#: 0 10*3/uL (ref 0.0–0.2)
BASO%: 0.5 % (ref 0.0–2.0)
EOS ABS: 0.1 10*3/uL (ref 0.0–0.5)
EOS%: 1.5 % (ref 0.0–7.0)
HEMATOCRIT: 35.2 % (ref 34.8–46.6)
HEMOGLOBIN: 11.6 g/dL (ref 11.6–15.9)
LYMPH#: 1.8 10*3/uL (ref 0.9–3.3)
LYMPH%: 31.3 % (ref 14.0–48.0)
MCH: 24.8 pg — AB (ref 26.0–34.0)
MCHC: 33 g/dL (ref 32.0–36.0)
MCV: 75 fL — ABNORMAL LOW (ref 81–101)
MONO#: 2 10*3/uL — ABNORMAL HIGH (ref 0.1–0.9)
MONO%: 34.4 % — ABNORMAL HIGH (ref 0.0–13.0)
NEUT#: 1.9 10*3/uL (ref 1.5–6.5)
NEUT%: 32.3 % — AB (ref 39.6–80.0)
Platelets: 286 10*3/uL (ref 145–400)
RBC: 4.67 10*6/uL (ref 3.70–5.32)
RDW: 19.2 % — AB (ref 11.1–15.7)
WBC: 5.8 10*3/uL (ref 3.9–10.0)

## 2015-09-10 LAB — CMP (CANCER CENTER ONLY)
ALBUMIN: 3.4 g/dL (ref 3.3–5.5)
ALT(SGPT): 32 U/L (ref 10–47)
AST: 39 U/L — ABNORMAL HIGH (ref 11–38)
Alkaline Phosphatase: 112 U/L — ABNORMAL HIGH (ref 26–84)
BUN, Bld: 12 mg/dL (ref 7–22)
CALCIUM: 9.7 mg/dL (ref 8.0–10.3)
CHLORIDE: 108 meq/L (ref 98–108)
CO2: 20 meq/L (ref 18–33)
CREATININE: 0.6 mg/dL (ref 0.6–1.2)
Glucose, Bld: 88 mg/dL (ref 73–118)
POTASSIUM: 3.4 meq/L (ref 3.3–4.7)
SODIUM: 138 meq/L (ref 128–145)
TOTAL PROTEIN: 7.1 g/dL (ref 6.4–8.1)
Total Bilirubin: 0.6 mg/dl (ref 0.20–1.60)

## 2015-09-10 LAB — LACTATE DEHYDROGENASE (CC13): LDH: 268 U/L — AB (ref 125–245)

## 2015-09-10 LAB — TECHNOLOGIST REVIEW CHCC SATELLITE

## 2015-09-10 MED ORDER — FLUOROURACIL CHEMO INJECTION 5 GM/100ML
2000.0000 mg/m2 | INTRAVENOUS | Status: DC
  Administered 2015-09-10: 3150 mg via INTRAVENOUS
  Filled 2015-09-10: qty 63

## 2015-09-10 MED ORDER — FLUOROURACIL CHEMO INJECTION 2.5 GM/50ML
400.0000 mg/m2 | Freq: Once | INTRAVENOUS | Status: AC
  Administered 2015-09-10: 650 mg via INTRAVENOUS
  Filled 2015-09-10: qty 13

## 2015-09-10 MED ORDER — LEUCOVORIN CALCIUM INJECTION 350 MG
400.0000 mg/m2 | Freq: Once | INTRAMUSCULAR | Status: AC
  Administered 2015-09-10: 632 mg via INTRAVENOUS
  Filled 2015-09-10: qty 31.6

## 2015-09-10 MED ORDER — SODIUM CHLORIDE 0.9 % IV SOLN
Freq: Once | INTRAVENOUS | Status: AC
  Administered 2015-09-10: 11:00:00 via INTRAVENOUS
  Filled 2015-09-10: qty 4

## 2015-09-10 MED ORDER — DEXTROSE 5 % IV SOLN
Freq: Once | INTRAVENOUS | Status: AC
  Administered 2015-09-10: 10:00:00 via INTRAVENOUS

## 2015-09-10 MED ORDER — DEXTROSE 5 % IV SOLN
76.5000 mg/m2 | Freq: Once | INTRAVENOUS | Status: AC
  Administered 2015-09-10: 120 mg via INTRAVENOUS
  Filled 2015-09-10: qty 20

## 2015-09-10 NOTE — Progress Notes (Signed)
Hematology and Oncology Follow Up Visit  Jacqueline Hahn [id number] Mar 09, 1946 69 y.o. 09/10/2015   Principle Diagnosis:  Metastatic adenocarcinoma of the pancreas  Current Therapy:   FOLFOX q 14 days s/p cycle 4    Interim History: Jacqueline Hahn is here today for follow-up . She is doing pretty well today. She already went to both.  We did go ahead and repeat a CT scan. This actually showed improvement in her disease. Her pulmonary metastases were less. Her pancreatic tumor was about stable in size.  She's had no abdominal pain. She's had no problems with constipation. She is on stool softeners which seemed to help.  She's had no jaundice. She's had no pruritus with her skin.  She's had no leg swelling.  She's had no bleeding. She has had no fever. She's had no cough or shortness of breath.   Overall, her performance status is ECOG 1..   Medications:    Medication List       This list is accurate as of: 09/10/15  9:37 AM.  Always use your most recent med list.               amLODipine 10 MG tablet  Commonly known as:  NORVASC  TAKE 1 TABLET (10 MG TOTAL) BY MOUTH DAILY.     CALCIUM + D PO  Take 1 tablet by mouth daily.     FLAX PO  Take 1 tablet by mouth daily.     GENERLAC 10 GM/15ML Soln  Generic drug:  lactulose (encephalopathy)     glipiZIDE-metformin 5-500 MG tablet  Commonly known as:  METAGLIP  Take 1 tablet by mouth 2 (two) times daily before a meal.     l-methylfolate-B6-B12 3-35-2 MG Tabs tablet  Commonly known as:  METANX  Take 1 tablet by mouth daily.     lactulose 10 GM/15ML solution  Commonly known as:  CHRONULAC  Take 30 mLs (20 g total) by mouth 3 (three) times daily.     lidocaine-prilocaine cream  Commonly known as:  EMLA  Apply 1 application topically as needed. Apply quarter sized amount to portacath site at least one hour prior to chemo treatment     LINZESS 145 MCG Caps capsule  Generic drug:  Linaclotide     megestrol 400 MG/10ML  suspension  Commonly known as:  MEGACE  Take 800 mg by mouth daily as needed (only uses when stomach is upset or feels stomach "needs" it).     multivitamin with minerals Tabs tablet  Take 1 tablet by mouth daily.     naloxegol oxalate 25 MG Tabs tablet  Commonly known as:  MOVANTIK  Take 1 tablet (25 mg total) by mouth daily.     traMADol 50 MG tablet  Commonly known as:  ULTRAM  Take 1-2 tablets (50-100 mg total) by mouth every 8 (eight) hours as needed for moderate pain.        Allergies: No Known Allergies  Past Medical History, Surgical history, Social history, and Family History were reviewed and updated.  Review of Systems: All other 10 point review of systems is negative.   Physical Exam:  height is 5\' 1"  (1.549 m) and weight is 126 lb (57.153 kg). Her oral temperature is 97.9 F (36.6 C). Her blood pressure is 171/86 and her pulse is 114. Her respiration is 16.   Wt Readings from Last 3 Encounters:  09/10/15 126 lb (57.153 kg)  08/21/15 123 lb (55.792 kg)  07/24/15 126 lb (  57.153 kg)    Ocular: Sclerae anicteric, pupils equal, round and reactive to light Ear-nose-throat: Oropharynx clear, dentition fair Lymphatic: No cervical or supraclavicular adenopathy Lungs no rales or rhonchi, good excursion bilaterally Heart regular rate and rhythm, no murmur appreciated Abd soft, nontender, positive bowel sounds MSK no focal spinal tenderness, no joint edema Neuro: non-focal, well-oriented, appropriate affect Breasts: Deferred  Lab Results  Component Value Date   WBC 5.2 08/21/2015   HGB 12.1 08/21/2015   HCT 35.9 08/21/2015   MCV 72* 08/21/2015   PLT 240 08/21/2015   Lab Results  Component Value Date   FERRITIN 205 06/25/2014   IRON 44 06/25/2014   TIBC 301 06/25/2014   UIBC 257 06/25/2014   IRONPCTSAT 15* 06/25/2014   Lab Results  Component Value Date   RBC 5.00 08/21/2015   No results found for: KPAFRELGTCHN, LAMBDASER, KAPLAMBRATIO No results found  for: Kandis Cocking, IGMSERUM No results found for: Odetta Pink, SPEI   Chemistry      Component Value Date/Time   NA 138 08/21/2015 0811   NA 138 05/28/2015 1043   K 3.5 08/21/2015 0811   K 4.5 05/28/2015 1043   CL 108 08/21/2015 0811   CL 105 05/28/2015 1043   CO2 21 08/21/2015 0811   CO2 22 05/28/2015 1043   BUN 16 08/21/2015 0811   BUN 18 05/28/2015 1043   CREATININE 0.8 08/21/2015 0811   CREATININE 0.75 05/28/2015 1043      Component Value Date/Time   CALCIUM 9.7 08/21/2015 0811   CALCIUM 9.8 05/28/2015 1043   ALKPHOS 105* 08/21/2015 0811   ALKPHOS 97 05/28/2015 1043   AST 43* 08/21/2015 0811   AST 23 05/28/2015 1043   ALT 34 08/21/2015 0811   ALT 22 05/28/2015 1043   BILITOT 0.60 08/21/2015 0811   BILITOT 0.6 05/28/2015 1043     Impression and Plan: Jacqueline Hahn is a 69 year old African-American female. She is metastatic adenocarcinoma of the pancreas.  She is responding by the CT scans. Her clinical performance status is also improving.  Thankfully, her CA 19-9 also is coming down. Back in October it was 13,200.   We will go ahead and treat her today.   I offered her a one-week break so that she does not have a treated the week of Thanksgiving. She does not want to do this. She wants to keep her treatments on schedule.  [name], MD 11/8/20169:37 AM

## 2015-09-10 NOTE — Patient Instructions (Signed)
[facility] Discharge Instructions for Patients Receiving Chemotherapy  Today you received the following chemotherapy agents LEUCOVORIN, OXALIPLATIN, 5FU.  To help prevent nausea and vomiting after your treatment, we encourage you to take your nausea medication AS PRESCRIBED.    If you develop nausea and vomiting that is not controlled by your nausea medication, call the clinic.   BELOW ARE SYMPTOMS THAT SHOULD BE REPORTED IMMEDIATELY:  *FEVER GREATER THAN 100.5 F  *CHILLS WITH OR WITHOUT FEVER  NAUSEA AND VOMITING THAT IS NOT CONTROLLED WITH YOUR NAUSEA MEDICATION  *UNUSUAL SHORTNESS OF BREATH  *UNUSUAL BRUISING OR BLEEDING  TENDERNESS IN MOUTH AND THROAT WITH OR WITHOUT PRESENCE OF ULCERS  *URINARY PROBLEMS  *BOWEL PROBLEMS  UNUSUAL RASH Items with * indicate a potential emergency and should be followed up as soon as possible.  Feel free to call the clinic you have any questions or concerns. The clinic phone number is [phone].  Please show the [facility] at check-in to the Emergency Department and triage nurse.

## 2015-09-11 LAB — PREALBUMIN: PREALBUMIN: 27 mg/dL (ref 17–34)

## 2015-09-11 LAB — CANCER ANTIGEN 19-9: CA 19-9: 9285.6 U/mL — ABNORMAL HIGH (ref <35.0)

## 2015-09-12 ENCOUNTER — Ambulatory Visit (HOSPITAL_BASED_OUTPATIENT_CLINIC_OR_DEPARTMENT_OTHER): Payer: Medicare Other

## 2015-09-12 VITALS — BP 155/79 | HR 114 | Temp 98.2°F | Resp 20

## 2015-09-12 DIAGNOSIS — Z452 Encounter for adjustment and management of vascular access device: Secondary | ICD-10-CM | POA: Diagnosis not present

## 2015-09-12 DIAGNOSIS — K8689 Other specified diseases of pancreas: Secondary | ICD-10-CM

## 2015-09-12 DIAGNOSIS — C259 Malignant neoplasm of pancreas, unspecified: Secondary | ICD-10-CM

## 2015-09-12 DIAGNOSIS — C7800 Secondary malignant neoplasm of unspecified lung: Secondary | ICD-10-CM

## 2015-09-12 MED ORDER — SODIUM CHLORIDE 0.9 % IJ SOLN
10.0000 mL | INTRAMUSCULAR | Status: DC | PRN
  Administered 2015-09-12: 10 mL
  Filled 2015-09-12: qty 10

## 2015-09-12 MED ORDER — HEPARIN SOD (PORK) LOCK FLUSH 100 UNIT/ML IV SOLN
500.0000 [IU] | Freq: Once | INTRAVENOUS | Status: AC | PRN
  Administered 2015-09-12: 500 [IU]
  Filled 2015-09-12: qty 5

## 2015-09-12 NOTE — Patient Instructions (Signed)

## 2015-09-16 ENCOUNTER — Telehealth: Payer: Self-pay | Admitting: *Deleted

## 2015-09-16 NOTE — Telephone Encounter (Signed)
Patient c/o pain in her throat. She feels like her neck has some swelling. Her R ear is also sore. Spoke to Dr Marin Olp who wants patient to take an OTC decongestant. Notified patient and she will start this evening.  Also reviewed with patient to call the office back if she declines, or to head to the ED if she feels like her breathing is compromised. She is in understanding.

## 2015-09-25 ENCOUNTER — Encounter: Payer: Self-pay | Admitting: Family

## 2015-09-25 ENCOUNTER — Ambulatory Visit: Payer: Medicare Other

## 2015-09-25 ENCOUNTER — Ambulatory Visit (HOSPITAL_BASED_OUTPATIENT_CLINIC_OR_DEPARTMENT_OTHER): Payer: Medicare Other

## 2015-09-25 ENCOUNTER — Ambulatory Visit (HOSPITAL_BASED_OUTPATIENT_CLINIC_OR_DEPARTMENT_OTHER): Payer: Medicare Other | Admitting: Family

## 2015-09-25 ENCOUNTER — Other Ambulatory Visit (HOSPITAL_BASED_OUTPATIENT_CLINIC_OR_DEPARTMENT_OTHER): Payer: Medicare Other

## 2015-09-25 ENCOUNTER — Other Ambulatory Visit: Payer: Medicare Other

## 2015-09-25 VITALS — BP 174/96 | HR 126 | Temp 97.9°F | Resp 18 | Ht 61.0 in | Wt 125.0 lb

## 2015-09-25 DIAGNOSIS — Z5111 Encounter for antineoplastic chemotherapy: Secondary | ICD-10-CM

## 2015-09-25 DIAGNOSIS — C7800 Secondary malignant neoplasm of unspecified lung: Secondary | ICD-10-CM

## 2015-09-25 DIAGNOSIS — C259 Malignant neoplasm of pancreas, unspecified: Secondary | ICD-10-CM

## 2015-09-25 DIAGNOSIS — K8689 Other specified diseases of pancreas: Secondary | ICD-10-CM

## 2015-09-25 LAB — CMP (CANCER CENTER ONLY)
ALK PHOS: 92 U/L — AB (ref 26–84)
ALT(SGPT): 25 U/L (ref 10–47)
AST: 38 U/L (ref 11–38)
Albumin: 3.5 g/dL (ref 3.3–5.5)
BUN, Bld: 11 mg/dL (ref 7–22)
CALCIUM: 9.7 mg/dL (ref 8.0–10.3)
CO2: 23 meq/L (ref 18–33)
Chloride: 108 mEq/L (ref 98–108)
Creat: 0.6 mg/dl (ref 0.6–1.2)
GLUCOSE: 132 mg/dL — AB (ref 73–118)
POTASSIUM: 3.3 meq/L (ref 3.3–4.7)
SODIUM: 144 meq/L (ref 128–145)
Total Bilirubin: 0.6 mg/dl (ref 0.20–1.60)
Total Protein: 7.4 g/dL (ref 6.4–8.1)

## 2015-09-25 LAB — CBC WITH DIFFERENTIAL (CANCER CENTER ONLY)
BASO#: 0 10*3/uL (ref 0.0–0.2)
BASO%: 0.6 % (ref 0.0–2.0)
EOS%: 1.9 % (ref 0.0–7.0)
Eosinophils Absolute: 0.1 10*3/uL (ref 0.0–0.5)
HCT: 33.8 % — ABNORMAL LOW (ref 34.8–46.6)
HEMOGLOBIN: 11 g/dL — AB (ref 11.6–15.9)
LYMPH#: 1.5 10*3/uL (ref 0.9–3.3)
LYMPH%: 32 % (ref 14.0–48.0)
MCH: 24.9 pg — AB (ref 26.0–34.0)
MCHC: 32.5 g/dL (ref 32.0–36.0)
MCV: 77 fL — ABNORMAL LOW (ref 81–101)
MONO#: 1.4 10*3/uL — ABNORMAL HIGH (ref 0.1–0.9)
MONO%: 31.2 % — AB (ref 0.0–13.0)
NEUT%: 34.3 % — AB (ref 39.6–80.0)
NEUTROS ABS: 1.6 10*3/uL (ref 1.5–6.5)
PLATELETS: 299 10*3/uL (ref 145–400)
RBC: 4.41 10*6/uL (ref 3.70–5.32)
RDW: 19.3 % — ABNORMAL HIGH (ref 11.1–15.7)
WBC: 4.6 10*3/uL (ref 3.9–10.0)

## 2015-09-25 MED ORDER — SODIUM CHLORIDE 0.9 % IV SOLN
2000.0000 mg/m2 | INTRAVENOUS | Status: DC
  Administered 2015-09-25: 3150 mg via INTRAVENOUS
  Filled 2015-09-25: qty 63

## 2015-09-25 MED ORDER — SODIUM CHLORIDE 0.9 % IJ SOLN
3.0000 mL | INTRAMUSCULAR | Status: DC | PRN
  Filled 2015-09-25: qty 10

## 2015-09-25 MED ORDER — SODIUM CHLORIDE 0.9 % IV SOLN
Freq: Once | INTRAVENOUS | Status: AC
  Administered 2015-09-25: 10:00:00 via INTRAVENOUS
  Filled 2015-09-25: qty 4

## 2015-09-25 MED ORDER — HEPARIN SOD (PORK) LOCK FLUSH 100 UNIT/ML IV SOLN
500.0000 [IU] | Freq: Once | INTRAVENOUS | Status: DC | PRN
  Filled 2015-09-25: qty 5

## 2015-09-25 MED ORDER — LEUCOVORIN CALCIUM INJECTION 350 MG
400.0000 mg/m2 | Freq: Once | INTRAVENOUS | Status: AC
  Administered 2015-09-25: 632 mg via INTRAVENOUS
  Filled 2015-09-25: qty 31.6

## 2015-09-25 MED ORDER — DEXTROSE 5 % IV SOLN
Freq: Once | INTRAVENOUS | Status: AC
  Administered 2015-09-25: 10:00:00 via INTRAVENOUS

## 2015-09-25 MED ORDER — FLUOROURACIL CHEMO INJECTION 2.5 GM/50ML
400.0000 mg/m2 | Freq: Once | INTRAVENOUS | Status: AC
  Administered 2015-09-25: 650 mg via INTRAVENOUS
  Filled 2015-09-25: qty 13

## 2015-09-25 MED ORDER — HEPARIN SOD (PORK) LOCK FLUSH 100 UNIT/ML IV SOLN
250.0000 [IU] | Freq: Once | INTRAVENOUS | Status: DC | PRN
  Filled 2015-09-25: qty 5

## 2015-09-25 MED ORDER — SODIUM CHLORIDE 0.9 % IJ SOLN
10.0000 mL | INTRAMUSCULAR | Status: DC | PRN
  Filled 2015-09-25: qty 10

## 2015-09-25 MED ORDER — ALTEPLASE 2 MG IJ SOLR
2.0000 mg | Freq: Once | INTRAMUSCULAR | Status: DC | PRN
  Filled 2015-09-25: qty 2

## 2015-09-25 MED ORDER — DEXTROSE 5 % IV SOLN
76.5000 mg/m2 | Freq: Once | INTRAVENOUS | Status: AC
  Administered 2015-09-25: 120 mg via INTRAVENOUS
  Filled 2015-09-25: qty 24

## 2015-09-25 NOTE — Patient Instructions (Signed)
Leucovorin injection What is this medicine? LEUCOVORIN (loo koe VOR in) is used to prevent or treat the harmful effects of some medicines. This medicine is used to treat anemia caused by a low amount of folic acid in the body. It is also used with 5-fluorouracil (5-FU) to treat colon cancer. This medicine may be used for other purposes; ask your health care provider or pharmacist if you have questions. What should I tell my health care provider before I take this medicine? They need to know if you have any of these conditions: -anemia from low levels of vitamin B-12 in the blood -an unusual or allergic reaction to leucovorin, folic acid, other medicines, foods, dyes, or preservatives -pregnant or trying to get pregnant -breast-feeding How should I use this medicine? This medicine is for injection into a muscle or into a vein. It is given by a health care professional in a hospital or clinic setting. Talk to your pediatrician regarding the use of this medicine in children. Special care may be needed. Overdosage: If you think you have taken too much of this medicine contact a poison control center or emergency room at once. NOTE: This medicine is only for you. Do not share this medicine with others. What if I miss a dose? This does not apply. What may interact with this medicine? -capecitabine -fluorouracil -phenobarbital -phenytoin -primidone -trimethoprim-sulfamethoxazole This list may not describe all possible interactions. Give your health care provider a list of all the medicines, herbs, non-prescription drugs, or dietary supplements you use. Also tell them if you smoke, drink alcohol, or use illegal drugs. Some items may interact with your medicine. What should I watch for while using this medicine? Your condition will be monitored carefully while you are receiving this medicine. This medicine may increase the side effects of 5-fluorouracil, 5-FU. Tell your doctor or health care  professional if you have diarrhea or mouth sores that do not get better or that get worse. What side effects may I notice from receiving this medicine? Side effects that you should report to your doctor or health care professional as soon as possible: -allergic reactions like skin rash, itching or hives, swelling of the face, lips, or tongue -breathing problems -fever, infection -mouth sores -unusual bleeding or bruising -unusually weak or tired Side effects that usually do not require medical attention (report to your doctor or health care professional if they continue or are bothersome): -constipation or diarrhea -loss of appetite -nausea, vomiting This list may not describe all possible side effects. Call your doctor for medical advice about side effects. You may report side effects to FDA at 1-800-FDA-1088. Where should I keep my medicine? This drug is given in a hospital or clinic and will not be stored at home. NOTE: This sheet is a summary. It may not cover all possible information. If you have questions about this medicine, talk to your doctor, pharmacist, or health care provider.    2016, Elsevier/Gold Standard. (2008-04-24 16:50:29) Oxaliplatin Injection What is this medicine? OXALIPLATIN (ox AL i PLA tin) is a chemotherapy drug. It targets fast dividing cells, like cancer cells, and causes these cells to die. This medicine is used to treat cancers of the colon and rectum, and many other cancers. This medicine may be used for other purposes; ask your health care provider or pharmacist if you have questions. What should I tell my health care provider before I take this medicine? They need to know if you have any of these conditions: -kidney disease -an  unusual or allergic reaction to oxaliplatin, other chemotherapy, other medicines, foods, dyes, or preservatives -pregnant or trying to get pregnant -breast-feeding How should I use this medicine? This drug is given as an  infusion into a vein. It is administered in a hospital or clinic by a specially trained health care professional. Talk to your pediatrician regarding the use of this medicine in children. Special care may be needed. Overdosage: If you think you have taken too much of this medicine contact a poison control center or emergency room at once. NOTE: This medicine is only for you. Do not share this medicine with others. What if I miss a dose? It is important not to miss a dose. Call your doctor or health care professional if you are unable to keep an appointment. What may interact with this medicine? -medicines to increase blood counts like filgrastim, pegfilgrastim, sargramostim -probenecid -some antibiotics like amikacin, gentamicin, neomycin, polymyxin B, streptomycin, tobramycin -zalcitabine Talk to your doctor or health care professional before taking any of these medicines: -acetaminophen -aspirin -ibuprofen -ketoprofen -naproxen This list may not describe all possible interactions. Give your health care provider a list of all the medicines, herbs, non-prescription drugs, or dietary supplements you use. Also tell them if you smoke, drink alcohol, or use illegal drugs. Some items may interact with your medicine. What should I watch for while using this medicine? Your condition will be monitored carefully while you are receiving this medicine. You will need important blood work done while you are taking this medicine. This medicine can make you more sensitive to cold. Do not drink cold drinks or use ice. Cover exposed skin before coming in contact with cold temperatures or cold objects. When out in cold weather wear warm clothing and cover your mouth and nose to warm the air that goes into your lungs. Tell your doctor if you get sensitive to the cold. This drug may make you feel generally unwell. This is not uncommon, as chemotherapy can affect healthy cells as well as cancer cells. Report any  side effects. Continue your course of treatment even though you feel ill unless your doctor tells you to stop. In some cases, you may be given additional medicines to help with side effects. Follow all directions for their use. Call your doctor or health care professional for advice if you get a fever, chills or sore throat, or other symptoms of a cold or flu. Do not treat yourself. This drug decreases your body's ability to fight infections. Try to avoid being around people who are sick. This medicine may increase your risk to bruise or bleed. Call your doctor or health care professional if you notice any unusual bleeding. Be careful brushing and flossing your teeth or using a toothpick because you may get an infection or bleed more easily. If you have any dental work done, tell your dentist you are receiving this medicine. Avoid taking products that contain aspirin, acetaminophen, ibuprofen, naproxen, or ketoprofen unless instructed by your doctor. These medicines may hide a fever. Do not become pregnant while taking this medicine. Women should inform their doctor if they wish to become pregnant or think they might be pregnant. There is a potential for serious side effects to an unborn child. Talk to your health care professional or pharmacist for more information. Do not breast-feed an infant while taking this medicine. Call your doctor or health care professional if you get diarrhea. Do not treat yourself. What side effects may I notice from receiving this medicine?   fever.  Do not become pregnant while taking this medicine. Women should inform their doctor if they wish to become pregnant or think they might be pregnant. There is a potential for serious side effects to an unborn child. Talk to your health care professional or pharmacist for more information. Do not breast-feed an infant while taking this medicine.  Call your doctor or health care professional if you get diarrhea. Do not treat yourself.  What side effects may I notice from receiving this medicine?  Side effects that you should report to your doctor or health care professional as soon as possible:  -allergic reactions like skin rash, itching or hives, swelling of the face, lips, or tongue  -low blood counts - This drug may decrease the number of white blood cells, red blood cells and platelets. You may be at increased risk for infections and bleeding.  -signs of infection - fever or chills, cough, sore throat, pain or difficulty passing  urine  -signs of decreased platelets or bleeding - bruising, pinpoint red spots on the skin, black, tarry stools, nosebleeds  -signs of decreased red blood cells - unusually weak or tired, fainting spells, lightheadedness  -breathing problems  -chest pain, pressure  -cough  -diarrhea  -jaw tightness  -mouth sores  -nausea and vomiting  -pain, swelling, redness or irritation at the injection site  -pain, tingling, numbness in the hands or feet  -problems with balance, talking, walking  -redness, blistering, peeling or loosening of the skin, including inside the mouth  -trouble passing urine or change in the amount of urine  Side effects that usually do not require medical attention (report to your doctor or health care professional if they continue or are bothersome):  -changes in vision  -constipation  -hair loss  -loss of appetite  -metallic taste in the mouth or changes in taste  -stomach pain  This list may not describe all possible side effects. Call your doctor for medical advice about side effects. You may report side effects to FDA at 1-800-FDA-1088.  Where should I keep my medicine?  This drug is given in a hospital or clinic and will not be stored at home.  NOTE: This sheet is a summary. It may not cover all possible information. If you have questions about this medicine, talk to your doctor, pharmacist, or health care provider.      2016, Elsevier/Gold Standard. (2008-05-15 17:22:47)

## 2015-09-25 NOTE — Progress Notes (Signed)
Hematology and Oncology Follow Up Visit  Jacqueline Hahn [id number] May 04, 1946 69 y.o. 09/25/2015   Principle Diagnosis:  Metastatic adenocarcinoma of the pancreas  Current Therapy:   FOLFOX q 14 days s/p cycle 5    Interim History: Jacqueline Hahn is here today for follow-up and cycle 6 of FOLFOX. She has had some fatigue at this time. Unfortunately she is under family related stress. She plans to spend Thanksgiving at home by herself to have a break.  Her scans earlier this month showed a nice response to treatment. Her CA 19.9 was also coming down nicely.  She did not take her blood pressure medication until after she arrived in our office. She was hypertensive at 174/96 and experiencing some dizziness and "fuzzy" vision. These symptoms are beginning to improve.  No fever, chills, n/v, cough, rash, headache, dizziness, chest pain, palpitations.  She has some abdominal pain due to gas periodically. She states that the Movantik and stool softeners have helped with her constipation. She is no longer taking the Linzess.  No swelling in her extremities. She has some pain at times in her right ankle. This comes and goes.  She has neuropathy in her feet off and on after each treatment. This has not affected her gait and she has had no falls.  She is monitoring her blood sugars and states that they average 110's-130's. She is on Metaglip daily.   Medications:    Medication List       This list is accurate as of: 09/25/15  8:44 AM.  Always use your most recent med list.               amLODipine 10 MG tablet  Commonly known as:  NORVASC  TAKE 1 TABLET (10 MG TOTAL) BY MOUTH DAILY.     CALCIUM + D PO  Take 1 tablet by mouth daily.     FLAX PO  Take 1 tablet by mouth daily.     GENERLAC 10 GM/15ML Soln  Generic drug:  lactulose (encephalopathy)     glipiZIDE-metformin 5-500 MG tablet  Commonly known as:  METAGLIP  Take 1 tablet by mouth 2 (two) times daily before a meal.     l-methylfolate-B6-B12 3-35-2 MG Tabs tablet  Commonly known as:  METANX  Take 1 tablet by mouth daily.     lactulose 10 GM/15ML solution  Commonly known as:  CHRONULAC  Take 30 mLs (20 g total) by mouth 3 (three) times daily.     lidocaine-prilocaine cream  Commonly known as:  EMLA  Apply 1 application topically as needed. Apply quarter sized amount to portacath site at least one hour prior to chemo treatment     LINZESS 145 MCG Caps capsule  Generic drug:  Linaclotide     megestrol 400 MG/10ML suspension  Commonly known as:  MEGACE  Take 800 mg by mouth daily as needed (only uses when stomach is upset or feels stomach "needs" it).     multivitamin with minerals Tabs tablet  Take 1 tablet by mouth daily.     naloxegol oxalate 25 MG Tabs tablet  Commonly known as:  MOVANTIK  Take 1 tablet (25 mg total) by mouth daily.     traMADol 50 MG tablet  Commonly known as:  ULTRAM  Take 1-2 tablets (50-100 mg total) by mouth every 8 (eight) hours as needed for moderate pain.        Allergies: No Known Allergies  Past Medical History, Surgical history, Social history, and  Family History were reviewed and updated.  Review of Systems: All other 10 point review of systems is negative.   Physical Exam:  vitals were not taken for this visit.  Wt Readings from Last 3 Encounters:  09/10/15 126 lb (57.153 kg)  08/21/15 123 lb (55.792 kg)  07/24/15 126 lb (57.153 kg)    Ocular: Sclerae unicteric, pupils equal, round and reactive to light Ear-nose-throat: Oropharynx clear, dentition fair Lymphatic: No cervical supraclavicular or axillary adenopathy Lungs no rales or rhonchi, good excursion bilaterally Heart regular rate and rhythm, no murmur appreciated Abd soft, nontender, positive bowel sounds MSK no focal spinal tenderness, no joint edema Neuro: non-focal, well-oriented, appropriate affect Breasts: Deferred  Lab Results  Component Value Date   WBC 5.8 09/10/2015   HGB 11.6  09/10/2015   HCT 35.2 09/10/2015   MCV 75* 09/10/2015   PLT 286 09/10/2015   Lab Results  Component Value Date   FERRITIN 205 06/25/2014   IRON 44 06/25/2014   TIBC 301 06/25/2014   UIBC 257 06/25/2014   IRONPCTSAT 15* 06/25/2014   Lab Results  Component Value Date   RBC 4.67 09/10/2015   No results found for: KPAFRELGTCHN, LAMBDASER, KAPLAMBRATIO No results found for: Kandis Cocking, IGMSERUM No results found for: Odetta Pink, SPEI   Chemistry      Component Value Date/Time   NA 138 09/10/2015 0835   NA 138 05/28/2015 1043   K 3.4 09/10/2015 0835   K 4.5 05/28/2015 1043   CL 108 09/10/2015 0835   CL 105 05/28/2015 1043   CO2 20 09/10/2015 0835   CO2 22 05/28/2015 1043   BUN 12 09/10/2015 0835   BUN 18 05/28/2015 1043   CREATININE 0.6 09/10/2015 0835   CREATININE 0.75 05/28/2015 1043      Component Value Date/Time   CALCIUM 9.7 09/10/2015 0835   CALCIUM 9.8 05/28/2015 1043   ALKPHOS 112* 09/10/2015 0835   ALKPHOS 97 05/28/2015 1043   AST 39* 09/10/2015 0835   AST 23 05/28/2015 1043   ALT 32 09/10/2015 0835   ALT 22 05/28/2015 1043   BILITOT 0.60 09/10/2015 0835   BILITOT 0.6 05/28/2015 1043     Impression and Plan: Jacqueline Hahn is 69 year old female with metastatic adenocarcinoma of pancreas. Recent scans showed a nice response to treatment. She has had some fatigue and neuropathy that lasts for several days after each cycle.  We will proceed with cycle 6 of treatment.  We rechecked her CA 19.9 today and results are pending.  She has her current appointment/treatment schedule.  She will contact us with any questions or concerns. We can certainly see her sooner if need be.   Eliezer Bottom, NP 11/23/20168:44 AM

## 2015-09-27 ENCOUNTER — Ambulatory Visit (HOSPITAL_BASED_OUTPATIENT_CLINIC_OR_DEPARTMENT_OTHER): Payer: Medicare Other

## 2015-09-27 VITALS — BP 157/79 | HR 107 | Temp 98.2°F | Resp 16

## 2015-09-27 DIAGNOSIS — C7800 Secondary malignant neoplasm of unspecified lung: Secondary | ICD-10-CM | POA: Diagnosis not present

## 2015-09-27 DIAGNOSIS — C259 Malignant neoplasm of pancreas, unspecified: Secondary | ICD-10-CM

## 2015-09-27 DIAGNOSIS — K8689 Other specified diseases of pancreas: Secondary | ICD-10-CM

## 2015-09-27 MED ORDER — SODIUM CHLORIDE 0.9 % IJ SOLN
10.0000 mL | INTRAMUSCULAR | Status: DC | PRN
  Administered 2015-09-27: 10 mL
  Filled 2015-09-27: qty 10

## 2015-09-27 MED ORDER — HEPARIN SOD (PORK) LOCK FLUSH 100 UNIT/ML IV SOLN
500.0000 [IU] | Freq: Once | INTRAVENOUS | Status: AC | PRN
  Administered 2015-09-27: 500 [IU]
  Filled 2015-09-27: qty 5

## 2015-09-27 NOTE — Patient Instructions (Signed)

## 2015-10-02 ENCOUNTER — Other Ambulatory Visit: Payer: Self-pay | Admitting: Hematology & Oncology

## 2015-10-09 ENCOUNTER — Other Ambulatory Visit: Payer: Self-pay | Admitting: Hematology & Oncology

## 2015-10-09 ENCOUNTER — Other Ambulatory Visit (HOSPITAL_BASED_OUTPATIENT_CLINIC_OR_DEPARTMENT_OTHER): Payer: Medicare Other

## 2015-10-09 ENCOUNTER — Ambulatory Visit: Payer: Medicare Other | Admitting: Family

## 2015-10-09 ENCOUNTER — Ambulatory Visit (HOSPITAL_BASED_OUTPATIENT_CLINIC_OR_DEPARTMENT_OTHER): Payer: Medicare Other

## 2015-10-09 ENCOUNTER — Other Ambulatory Visit: Payer: Medicare Other

## 2015-10-09 VITALS — BP 149/75 | HR 111 | Temp 97.8°F | Resp 20

## 2015-10-09 DIAGNOSIS — C7800 Secondary malignant neoplasm of unspecified lung: Secondary | ICD-10-CM

## 2015-10-09 DIAGNOSIS — C259 Malignant neoplasm of pancreas, unspecified: Secondary | ICD-10-CM

## 2015-10-09 DIAGNOSIS — K8689 Other specified diseases of pancreas: Secondary | ICD-10-CM

## 2015-10-09 DIAGNOSIS — Z5111 Encounter for antineoplastic chemotherapy: Secondary | ICD-10-CM | POA: Diagnosis not present

## 2015-10-09 LAB — CBC WITH DIFFERENTIAL (CANCER CENTER ONLY)
BASO#: 0 10*3/uL (ref 0.0–0.2)
BASO%: 0.8 % (ref 0.0–2.0)
EOS ABS: 0.1 10*3/uL (ref 0.0–0.5)
EOS%: 2.3 % (ref 0.0–7.0)
HEMATOCRIT: 33.5 % — AB (ref 34.8–46.6)
HGB: 10.9 g/dL — ABNORMAL LOW (ref 11.6–15.9)
LYMPH#: 1.6 10*3/uL (ref 0.9–3.3)
LYMPH%: 30.8 % (ref 14.0–48.0)
MCH: 25 pg — AB (ref 26.0–34.0)
MCHC: 32.5 g/dL (ref 32.0–36.0)
MCV: 77 fL — AB (ref 81–101)
MONO#: 1.5 10*3/uL — AB (ref 0.1–0.9)
MONO%: 27.9 % — ABNORMAL HIGH (ref 0.0–13.0)
NEUT#: 2 10*3/uL (ref 1.5–6.5)
NEUT%: 38.2 % — AB (ref 39.6–80.0)
Platelets: 262 10*3/uL (ref 145–400)
RBC: 4.36 10*6/uL (ref 3.70–5.32)
RDW: 18.4 % — AB (ref 11.1–15.7)
WBC: 5.2 10*3/uL (ref 3.9–10.0)

## 2015-10-09 LAB — CMP (CANCER CENTER ONLY)
ALBUMIN: 3.2 g/dL — AB (ref 3.3–5.5)
ALK PHOS: 94 U/L — AB (ref 26–84)
ALT(SGPT): 30 U/L (ref 10–47)
AST: 42 U/L — AB (ref 11–38)
BILIRUBIN TOTAL: 0.6 mg/dL (ref 0.20–1.60)
BUN, Bld: 14 mg/dL (ref 7–22)
CALCIUM: 9.4 mg/dL (ref 8.0–10.3)
CO2: 22 meq/L (ref 18–33)
Chloride: 104 mEq/L (ref 98–108)
Creat: 0.7 mg/dl (ref 0.6–1.2)
Glucose, Bld: 145 mg/dL — ABNORMAL HIGH (ref 73–118)
Potassium: 3.2 mEq/L — ABNORMAL LOW (ref 3.3–4.7)
Sodium: 137 mEq/L (ref 128–145)
Total Protein: 6.9 g/dL (ref 6.4–8.1)

## 2015-10-09 LAB — CANCER ANTIGEN 19-9: CA 19-9: 6273.7 U/mL — ABNORMAL HIGH (ref <35.0)

## 2015-10-09 MED ORDER — FLUOROURACIL CHEMO INJECTION 2.5 GM/50ML
400.0000 mg/m2 | Freq: Once | INTRAVENOUS | Status: AC
  Administered 2015-10-09: 650 mg via INTRAVENOUS
  Filled 2015-10-09: qty 13

## 2015-10-09 MED ORDER — SODIUM CHLORIDE 0.9 % IV SOLN
2000.0000 mg/m2 | INTRAVENOUS | Status: DC
  Administered 2015-10-09: 3150 mg via INTRAVENOUS
  Filled 2015-10-09: qty 63

## 2015-10-09 MED ORDER — OXALIPLATIN CHEMO INJECTION 100 MG/20ML
76.5000 mg/m2 | Freq: Once | INTRAVENOUS | Status: AC
  Administered 2015-10-09: 120 mg via INTRAVENOUS
  Filled 2015-10-09: qty 20

## 2015-10-09 MED ORDER — SODIUM CHLORIDE 0.9 % IJ SOLN
10.0000 mL | INTRAMUSCULAR | Status: DC | PRN
  Filled 2015-10-09: qty 10

## 2015-10-09 MED ORDER — SODIUM CHLORIDE 0.9 % IV SOLN
Freq: Once | INTRAVENOUS | Status: AC
  Administered 2015-10-09: 10:00:00 via INTRAVENOUS
  Filled 2015-10-09: qty 4

## 2015-10-09 MED ORDER — DEXTROSE 5 % IV SOLN
Freq: Once | INTRAVENOUS | Status: AC
  Administered 2015-10-09: 09:00:00 via INTRAVENOUS

## 2015-10-09 MED ORDER — HEPARIN SOD (PORK) LOCK FLUSH 100 UNIT/ML IV SOLN
500.0000 [IU] | Freq: Once | INTRAVENOUS | Status: DC | PRN
  Filled 2015-10-09: qty 5

## 2015-10-09 MED ORDER — DEXTROSE 5 % IV SOLN
400.0000 mg/m2 | Freq: Once | INTRAVENOUS | Status: AC
  Administered 2015-10-09: 632 mg via INTRAVENOUS
  Filled 2015-10-09: qty 31.6

## 2015-10-09 NOTE — Patient Instructions (Signed)
[facility] Discharge Instructions for Patients Receiving Chemotherapy  Today you received the following chemotherapy agents Oxaliplatin, Leucovorin and 5FU.  To help prevent nausea and vomiting after your treatment, we encourage you to take your nausea medication.   If you develop nausea and vomiting that is not controlled by your nausea medication, call the clinic.   BELOW ARE SYMPTOMS THAT SHOULD BE REPORTED IMMEDIATELY:  *FEVER GREATER THAN 100.5 F  *CHILLS WITH OR WITHOUT FEVER  NAUSEA AND VOMITING THAT IS NOT CONTROLLED WITH YOUR NAUSEA MEDICATION  *UNUSUAL SHORTNESS OF BREATH  *UNUSUAL BRUISING OR BLEEDING  TENDERNESS IN MOUTH AND THROAT WITH OR WITHOUT PRESENCE OF ULCERS  *URINARY PROBLEMS  *BOWEL PROBLEMS  UNUSUAL RASH Items with * indicate a potential emergency and should be followed up as soon as possible.  Feel free to call the clinic you have any questions or concerns. The clinic phone number is [phone].  Please show the CHEMO ALERT CARD at check-in to the Emergency Department and triage nurse.   

## 2015-10-11 ENCOUNTER — Ambulatory Visit (HOSPITAL_BASED_OUTPATIENT_CLINIC_OR_DEPARTMENT_OTHER): Payer: Medicare Other

## 2015-10-11 VITALS — BP 149/78 | HR 110 | Temp 98.1°F

## 2015-10-11 DIAGNOSIS — Z452 Encounter for adjustment and management of vascular access device: Secondary | ICD-10-CM | POA: Diagnosis not present

## 2015-10-11 DIAGNOSIS — C7800 Secondary malignant neoplasm of unspecified lung: Principal | ICD-10-CM

## 2015-10-11 DIAGNOSIS — K8689 Other specified diseases of pancreas: Secondary | ICD-10-CM

## 2015-10-11 DIAGNOSIS — C259 Malignant neoplasm of pancreas, unspecified: Secondary | ICD-10-CM | POA: Diagnosis not present

## 2015-10-11 MED ORDER — HEPARIN SOD (PORK) LOCK FLUSH 100 UNIT/ML IV SOLN
500.0000 [IU] | Freq: Once | INTRAVENOUS | Status: AC | PRN
  Administered 2015-10-11: 500 [IU]
  Filled 2015-10-11: qty 5

## 2015-10-11 MED ORDER — SODIUM CHLORIDE 0.9 % IJ SOLN
10.0000 mL | INTRAMUSCULAR | Status: DC | PRN
  Administered 2015-10-11: 10 mL
  Filled 2015-10-11: qty 10

## 2015-10-11 NOTE — Patient Instructions (Signed)
[facility] Discharge Instructions for Patients Receiving Chemotherapy  Today you received the following chemotherapy agents 5-FU.  To help prevent nausea and vomiting after your treatment, we encourage you to take your nausea medication.   If you develop nausea and vomiting that is not controlled by your nausea medication, call the clinic.   BELOW ARE SYMPTOMS THAT SHOULD BE REPORTED IMMEDIATELY:  *FEVER GREATER THAN 100.5 F  *CHILLS WITH OR WITHOUT FEVER  NAUSEA AND VOMITING THAT IS NOT CONTROLLED WITH YOUR NAUSEA MEDICATION  *UNUSUAL SHORTNESS OF BREATH  *UNUSUAL BRUISING OR BLEEDING  TENDERNESS IN MOUTH AND THROAT WITH OR WITHOUT PRESENCE OF ULCERS  *URINARY PROBLEMS  *BOWEL PROBLEMS  UNUSUAL RASH Items with * indicate a potential emergency and should be followed up as soon as possible.  Feel free to call the clinic you have any questions or concerns. The clinic phone number is [phone].  Please show the CHEMO ALERT CARD at check-in to the Emergency Department and triage nurse.   

## 2015-10-15 ENCOUNTER — Other Ambulatory Visit: Payer: Self-pay | Admitting: *Deleted

## 2015-10-15 DIAGNOSIS — C259 Malignant neoplasm of pancreas, unspecified: Secondary | ICD-10-CM

## 2015-10-15 DIAGNOSIS — C7800 Secondary malignant neoplasm of unspecified lung: Principal | ICD-10-CM

## 2015-10-15 MED ORDER — TRAMADOL HCL 50 MG PO TABS: 50.0000 mg | ORAL_TABLET | Freq: Three times a day (TID) | ORAL | Status: DC | PRN

## 2015-10-22 ENCOUNTER — Other Ambulatory Visit: Payer: Self-pay | Admitting: *Deleted

## 2015-10-22 DIAGNOSIS — C259 Malignant neoplasm of pancreas, unspecified: Secondary | ICD-10-CM

## 2015-10-22 DIAGNOSIS — C7800 Secondary malignant neoplasm of unspecified lung: Principal | ICD-10-CM

## 2015-10-23 ENCOUNTER — Ambulatory Visit: Payer: Medicare Other

## 2015-10-23 ENCOUNTER — Ambulatory Visit: Payer: Medicare Other | Admitting: Hematology & Oncology

## 2015-10-23 ENCOUNTER — Other Ambulatory Visit: Payer: Medicare Other

## 2015-10-30 ENCOUNTER — Ambulatory Visit (HOSPITAL_BASED_OUTPATIENT_CLINIC_OR_DEPARTMENT_OTHER): Payer: Medicare Other

## 2015-10-30 ENCOUNTER — Other Ambulatory Visit (HOSPITAL_BASED_OUTPATIENT_CLINIC_OR_DEPARTMENT_OTHER): Payer: Medicare Other

## 2015-10-30 ENCOUNTER — Ambulatory Visit (HOSPITAL_BASED_OUTPATIENT_CLINIC_OR_DEPARTMENT_OTHER): Payer: Medicare Other | Admitting: Family

## 2015-10-30 VITALS — BP 157/81 | HR 107 | Temp 97.9°F

## 2015-10-30 DIAGNOSIS — Z5111 Encounter for antineoplastic chemotherapy: Secondary | ICD-10-CM

## 2015-10-30 DIAGNOSIS — C7800 Secondary malignant neoplasm of unspecified lung: Secondary | ICD-10-CM

## 2015-10-30 DIAGNOSIS — K8689 Other specified diseases of pancreas: Secondary | ICD-10-CM

## 2015-10-30 DIAGNOSIS — C259 Malignant neoplasm of pancreas, unspecified: Secondary | ICD-10-CM

## 2015-10-30 LAB — CMP (CANCER CENTER ONLY)
ALT(SGPT): 90 U/L — ABNORMAL HIGH (ref 10–47)
AST: 67 U/L — AB (ref 11–38)
Albumin: 3.1 g/dL — ABNORMAL LOW (ref 3.3–5.5)
Alkaline Phosphatase: 227 U/L — ABNORMAL HIGH (ref 26–84)
BILIRUBIN TOTAL: 0.5 mg/dL (ref 0.20–1.60)
BUN: 12 mg/dL (ref 7–22)
CHLORIDE: 109 meq/L — AB (ref 98–108)
CO2: 25 mEq/L (ref 18–33)
CREATININE: 0.8 mg/dL (ref 0.6–1.2)
Calcium: 9.8 mg/dL (ref 8.0–10.3)
Glucose, Bld: 75 mg/dL (ref 73–118)
Potassium: 3.1 mEq/L — ABNORMAL LOW (ref 3.3–4.7)
SODIUM: 141 meq/L (ref 128–145)
TOTAL PROTEIN: 7.3 g/dL (ref 6.4–8.1)

## 2015-10-30 LAB — CBC WITH DIFFERENTIAL (CANCER CENTER ONLY)
BASO#: 0 10*3/uL (ref 0.0–0.2)
BASO%: 0.2 % (ref 0.0–2.0)
EOS ABS: 0.1 10*3/uL (ref 0.0–0.5)
EOS%: 1.2 % (ref 0.0–7.0)
HCT: 35.1 % (ref 34.8–46.6)
HEMOGLOBIN: 11.6 g/dL (ref 11.6–15.9)
LYMPH#: 2.3 10*3/uL (ref 0.9–3.3)
LYMPH%: 21.5 % (ref 14.0–48.0)
MCH: 25.1 pg — AB (ref 26.0–34.0)
MCHC: 33 g/dL (ref 32.0–36.0)
MCV: 76 fL — ABNORMAL LOW (ref 81–101)
MONO#: 2.6 10*3/uL — ABNORMAL HIGH (ref 0.1–0.9)
MONO%: 24.3 % — AB (ref 0.0–13.0)
NEUT%: 52.8 % (ref 39.6–80.0)
NEUTROS ABS: 5.6 10*3/uL (ref 1.5–6.5)
PLATELETS: 303 10*3/uL (ref 145–400)
RBC: 4.63 10*6/uL (ref 3.70–5.32)
RDW: 17 % — ABNORMAL HIGH (ref 11.1–15.7)
WBC: 10.7 10*3/uL — AB (ref 3.9–10.0)

## 2015-10-30 LAB — TECHNOLOGIST REVIEW CHCC SATELLITE

## 2015-10-30 MED ORDER — SODIUM CHLORIDE 0.9 % IV SOLN
Freq: Once | INTRAVENOUS | Status: AC
  Administered 2015-10-30: 11:00:00 via INTRAVENOUS
  Filled 2015-10-30: qty 4

## 2015-10-30 MED ORDER — HEPARIN SOD (PORK) LOCK FLUSH 100 UNIT/ML IV SOLN
500.0000 [IU] | Freq: Once | INTRAVENOUS | Status: DC | PRN
  Filled 2015-10-30: qty 5

## 2015-10-30 MED ORDER — LEUCOVORIN CALCIUM INJECTION 100 MG
20.0000 mg/m2 | Freq: Once | INTRAMUSCULAR | Status: AC
  Administered 2015-10-30: 30 mg via INTRAVENOUS
  Filled 2015-10-30: qty 1.5

## 2015-10-30 MED ORDER — FLUOROURACIL CHEMO INJECTION 2.5 GM/50ML
400.0000 mg/m2 | Freq: Once | INTRAVENOUS | Status: AC
  Administered 2015-10-30: 650 mg via INTRAVENOUS
  Filled 2015-10-30: qty 13

## 2015-10-30 MED ORDER — CYPROHEPTADINE HCL 4 MG PO TABS: 2.0000 mg | ORAL_TABLET | Freq: Three times a day (TID) | ORAL | Status: AC | PRN

## 2015-10-30 MED ORDER — SODIUM CHLORIDE 0.9 % IV SOLN
2000.0000 mg/m2 | INTRAVENOUS | Status: DC
  Administered 2015-10-30: 3150 mg via INTRAVENOUS
  Filled 2015-10-30: qty 63

## 2015-10-30 MED ORDER — SODIUM CHLORIDE 0.9 % IJ SOLN
10.0000 mL | INTRAMUSCULAR | Status: DC | PRN
  Filled 2015-10-30: qty 10

## 2015-10-30 MED ORDER — LEUCOVORIN CALCIUM INJECTION 350 MG: 400.0000 mg/m2 | Freq: Once | INTRAVENOUS | Status: DC

## 2015-10-30 MED ORDER — DEXTROSE 5 % IV SOLN
Freq: Once | INTRAVENOUS | Status: AC
  Administered 2015-10-30: 11:00:00 via INTRAVENOUS

## 2015-10-30 MED ORDER — OXALIPLATIN CHEMO INJECTION 100 MG/20ML
76.5000 mg/m2 | Freq: Once | INTRAVENOUS | Status: AC
  Administered 2015-10-30: 120 mg via INTRAVENOUS
  Filled 2015-10-30: qty 24

## 2015-10-30 NOTE — Progress Notes (Signed)
Hematology and Oncology Follow Up Visit  Jacqueline Hahn [id number] 09/13/1946 69 y.o. 10/30/2015   Principle Diagnosis:  Metastatic adenocarcinoma of the pancreas  Current Therapy:   FOLFOX q 14 days s/p cycle 7    Interim History: Jacqueline Hahn is here today for follow-up and cycle 8 of FOLFOX. She is feeling fatigued today. She is still having some right sided abdominal discomfort and flatus at times. She feels that this has been worse the last several days because she ate things over the holiday weekend she isn't used to like Gumbo.  Her blood sugars have been well controlled on Metaglip.  Her BMs have improved with the Movantik and stool softeners. She is using the Megace but states that she had a better appetite with the periactin and would like a prescription.  She had 1 episode of nausea without vomiting that resolved with zofran.  Her scans in November showed a nice response to treatment. Her CA 19-9 has continued to come down nicely.  No fever, chills, cough, rash, headache, dizziness, chest pain, palpitations.  No swelling or tenderness in her extremities at this time.  The neuropathy in her feet is unchanged. No falls.    Medications:    Medication List       This list is accurate as of: 10/30/15  8:46 AM.  Always use your most recent med list.               amLODipine 10 MG tablet  Commonly known as:  NORVASC  TAKE 1 TABLET (10 MG TOTAL) BY MOUTH DAILY.     CALCIUM + D PO  Take 1 tablet by mouth daily.     FLAX PO  Take 1 tablet by mouth daily. Reported on 10/30/2015     GENERLAC 10 GM/15ML Soln  Generic drug:  lactulose (encephalopathy)  Reported on 10/30/2015     glipiZIDE-metformin 5-500 MG tablet  Commonly known as:  METAGLIP  Take 1 tablet by mouth 2 (two) times daily before a meal.     l-methylfolate-B6-B12 3-35-2 MG Tabs tablet  Commonly known as:  METANX  Take 1 tablet by mouth daily.     lactulose 10 GM/15ML solution  Commonly known as:  CHRONULAC    Take 30 mLs (20 g total) by mouth 3 (three) times daily.     lidocaine-prilocaine cream  Commonly known as:  EMLA  Apply 1 application topically as needed. Apply quarter sized amount to portacath site at least one hour prior to chemo treatment     LINZESS 145 MCG Caps capsule  Generic drug:  Linaclotide  Reported on 10/30/2015     megestrol 400 MG/10ML suspension  Commonly known as:  MEGACE  Take 800 mg by mouth daily as needed (only uses when stomach is upset or feels stomach "needs" it).     multivitamin with minerals Tabs tablet  Take 1 tablet by mouth daily. Reported on 10/30/2015     naloxegol oxalate 25 MG Tabs tablet  Commonly known as:  MOVANTIK  Take 1 tablet (25 mg total) by mouth daily.     traMADol 50 MG tablet  Commonly known as:  ULTRAM  Take 1-2 tablets (50-100 mg total) by mouth every 8 (eight) hours as needed for moderate pain.        Allergies: No Known Allergies  Past Medical History, Surgical history, Social history, and Family History were reviewed and updated.  Review of Systems: All other 10 point review of systems is negative.  Physical Exam:  tympanic temperature is 97.9 F (36.6 C). Her blood pressure is 157/81 and her pulse is 107.   Wt Readings from Last 3 Encounters:  09/25/15 125 lb (56.7 kg)  09/10/15 126 lb (57.153 kg)  08/21/15 123 lb (55.792 kg)    Ocular: Sclerae unicteric, pupils equal, round and reactive to light Ear-nose-throat: Oropharynx clear, dentition fair Lymphatic: No cervical supraclavicular or axillary adenopathy Lungs no rales or rhonchi, good excursion bilaterally Heart regular rate and rhythm, no murmur appreciated Abd soft, right lower quadrant tender on palpation, positive bowel sounds, no liver or spleen tip palpated on exam MSK no focal spinal tenderness, no joint edema Neuro: non-focal, well-oriented, appropriate affect Breasts: Deferred  Lab Results  Component Value Date   WBC 5.2 10/09/2015   HGB  10.9* 10/09/2015   HCT 33.5* 10/09/2015   MCV 77* 10/09/2015   PLT 262 10/09/2015   Lab Results  Component Value Date   FERRITIN 205 06/25/2014   IRON 44 06/25/2014   TIBC 301 06/25/2014   UIBC 257 06/25/2014   IRONPCTSAT 15* 06/25/2014   Lab Results  Component Value Date   RBC 4.36 10/09/2015   No results found for: KPAFRELGTCHN, LAMBDASER, KAPLAMBRATIO No results found for: Kandis Cocking, IGMSERUM No results found for: Odetta Pink, SPEI   Chemistry      Component Value Date/Time   NA 137 10/09/2015 0850   NA 138 05/28/2015 1043   K 3.2* 10/09/2015 0850   K 4.5 05/28/2015 1043   CL 104 10/09/2015 0850   CL 105 05/28/2015 1043   CO2 22 10/09/2015 0850   CO2 22 05/28/2015 1043   BUN 14 10/09/2015 0850   BUN 18 05/28/2015 1043   CREATININE 0.7 10/09/2015 0850   CREATININE 0.75 05/28/2015 1043      Component Value Date/Time   CALCIUM 9.4 10/09/2015 0850   CALCIUM 9.8 05/28/2015 1043   ALKPHOS 94* 10/09/2015 0850   ALKPHOS 97 05/28/2015 1043   AST 42* 10/09/2015 0850   AST 23 05/28/2015 1043   ALT 30 10/09/2015 0850   ALT 22 05/28/2015 1043   BILITOT 0.60 10/09/2015 0850   BILITOT 0.6 05/28/2015 1043     Impression and Plan: Jacqueline Hahn is 70 year old female with metastatic adenocarcinoma of pancreas. Scans in November showed a nice response to treatment. She is still feeling fatigued and having some mild right abdominal discomfort that comes and goes.  Her LFT's today are quite elevated. We will plan to repeat her scans next week.  We will proceed with cycle 8 of treatment as planned.   Prescription for Periactin for appetite was sent to her pharmacy. We will give her a new treatment and appointment schedule today.  She will contact us with any questions or concerns. We can certainly see her sooner if need be.   Eliezer Bottom, NP 12/28/20168:46 AM

## 2015-10-30 NOTE — Patient Instructions (Signed)
[facility] Discharge Instructions for Patients Receiving Chemotherapy  Today you received the following chemotherapy agents Oxaliplatin, Leucovorin and 5FU.  To help prevent nausea and vomiting after your treatment, we encourage you to take your nausea medication.   If you develop nausea and vomiting that is not controlled by your nausea medication, call the clinic.   BELOW ARE SYMPTOMS THAT SHOULD BE REPORTED IMMEDIATELY:  *FEVER GREATER THAN 100.5 F  *CHILLS WITH OR WITHOUT FEVER  NAUSEA AND VOMITING THAT IS NOT CONTROLLED WITH YOUR NAUSEA MEDICATION  *UNUSUAL SHORTNESS OF BREATH  *UNUSUAL BRUISING OR BLEEDING  TENDERNESS IN MOUTH AND THROAT WITH OR WITHOUT PRESENCE OF ULCERS  *URINARY PROBLEMS  *BOWEL PROBLEMS  UNUSUAL RASH Items with * indicate a potential emergency and should be followed up as soon as possible.  Feel free to call the clinic you have any questions or concerns. The clinic phone number is [phone].  Please show the CHEMO ALERT CARD at check-in to the Emergency Department and triage nurse.   

## 2015-11-01 ENCOUNTER — Ambulatory Visit (HOSPITAL_BASED_OUTPATIENT_CLINIC_OR_DEPARTMENT_OTHER): Payer: Medicare Other

## 2015-11-01 VITALS — BP 147/72 | HR 90 | Temp 97.6°F | Resp 18

## 2015-11-01 DIAGNOSIS — C259 Malignant neoplasm of pancreas, unspecified: Secondary | ICD-10-CM | POA: Diagnosis not present

## 2015-11-01 DIAGNOSIS — C7800 Secondary malignant neoplasm of unspecified lung: Secondary | ICD-10-CM | POA: Diagnosis not present

## 2015-11-01 DIAGNOSIS — K8689 Other specified diseases of pancreas: Secondary | ICD-10-CM

## 2015-11-01 MED ORDER — HEPARIN SOD (PORK) LOCK FLUSH 100 UNIT/ML IV SOLN
500.0000 [IU] | Freq: Once | INTRAVENOUS | Status: AC | PRN
  Administered 2015-11-01: 500 [IU]
  Filled 2015-11-01: qty 5

## 2015-11-01 MED ORDER — SODIUM CHLORIDE 0.9 % IJ SOLN
10.0000 mL | INTRAMUSCULAR | Status: DC | PRN
  Administered 2015-11-01: 10 mL
  Filled 2015-11-01: qty 10

## 2015-11-01 NOTE — Patient Instructions (Signed)

## 2015-11-06 ENCOUNTER — Ambulatory Visit (HOSPITAL_BASED_OUTPATIENT_CLINIC_OR_DEPARTMENT_OTHER)
Admission: RE | Admit: 2015-11-06 | Discharge: 2015-11-06 | Disposition: A | Discharge status: Home / Self Care | Payer: Commercial Managed Care - HMO | Source: Ambulatory Visit | Attending: Family | Admitting: Family

## 2015-11-06 ENCOUNTER — Encounter (HOSPITAL_BASED_OUTPATIENT_CLINIC_OR_DEPARTMENT_OTHER): Payer: Self-pay

## 2015-11-06 ENCOUNTER — Ambulatory Visit (HOSPITAL_BASED_OUTPATIENT_CLINIC_OR_DEPARTMENT_OTHER): Payer: Commercial Managed Care - HMO

## 2015-11-06 VITALS — BP 146/87 | HR 120 | Temp 98.2°F | Resp 18

## 2015-11-06 DIAGNOSIS — Z452 Encounter for adjustment and management of vascular access device: Secondary | ICD-10-CM | POA: Diagnosis not present

## 2015-11-06 DIAGNOSIS — C259 Malignant neoplasm of pancreas, unspecified: Secondary | ICD-10-CM

## 2015-11-06 DIAGNOSIS — R109 Unspecified abdominal pain: Secondary | ICD-10-CM | POA: Diagnosis not present

## 2015-11-06 DIAGNOSIS — R188 Other ascites: Secondary | ICD-10-CM | POA: Diagnosis not present

## 2015-11-06 DIAGNOSIS — C786 Secondary malignant neoplasm of retroperitoneum and peritoneum: Secondary | ICD-10-CM | POA: Diagnosis not present

## 2015-11-06 DIAGNOSIS — K838 Other specified diseases of biliary tract: Secondary | ICD-10-CM | POA: Diagnosis not present

## 2015-11-06 DIAGNOSIS — R918 Other nonspecific abnormal finding of lung field: Secondary | ICD-10-CM | POA: Insufficient documentation

## 2015-11-06 DIAGNOSIS — K869 Disease of pancreas, unspecified: Secondary | ICD-10-CM | POA: Insufficient documentation

## 2015-11-06 DIAGNOSIS — C7800 Secondary malignant neoplasm of unspecified lung: Principal | ICD-10-CM

## 2015-11-06 DIAGNOSIS — C257 Malignant neoplasm of other parts of pancreas: Secondary | ICD-10-CM

## 2015-11-06 DIAGNOSIS — C7951 Secondary malignant neoplasm of bone: Secondary | ICD-10-CM | POA: Diagnosis not present

## 2015-11-06 MED ORDER — SODIUM CHLORIDE 0.9 % IJ SOLN
10.0000 mL | INTRAMUSCULAR | Status: DC | PRN
  Administered 2015-11-06: 10 mL via INTRAVENOUS
  Filled 2015-11-06: qty 10

## 2015-11-06 MED ORDER — IOHEXOL 300 MG/ML  SOLN
100.0000 mL | Freq: Once | INTRAMUSCULAR | Status: AC | PRN
  Administered 2015-11-06: 100 mL via INTRAVENOUS

## 2015-11-06 MED ORDER — HEPARIN SOD (PORK) LOCK FLUSH 100 UNIT/ML IV SOLN
500.0000 [IU] | Freq: Once | INTRAVENOUS | Status: AC
  Administered 2015-11-06: 500 [IU] via INTRAVENOUS
  Filled 2015-11-06: qty 5

## 2015-11-06 NOTE — Patient Instructions (Signed)
Implanted Port Insertion An implanted port is a central line that has a round shape and is placed under the skin. It is used as a long-term IV access for:   Medicines, such as chemotherapy.   Fluids.   Liquid nutrition, such as total parenteral nutrition (TPN).   Blood samples.  LET YOUR HEALTH CARE PROVIDER KNOW ABOUT:  Allergies to food or medicine.   Medicines taken, including vitamins, herbs, eye drops, creams, and over-the-counter medicines.   Any allergies to heparin.  Use of steroids (by mouth or creams).   Previous problems with anesthetics or numbing medicines.   History of bleeding problems or blood clots.   Previous surgery.   Other health problems, including diabetes and kidney problems.   Possibility of pregnancy, if this applies. RISKS AND COMPLICATIONS Generally, this is a safe procedure. However, as with any procedure, problems can occur. Possible problems include:  Damage to the blood vessel, bruising, or bleeding at the puncture site.   Infection.  Blood clot in the vessel that the port is in.  Breakdown of the skin over your port.  Very rarely a person may develop a condition called a pneumothorax, a collection of air in the chest that may cause one of the lungs to collapse. The placement of these catheters with the appropriate imaging guidance significantly decreases the risk of a pneumothorax.  BEFORE THE PROCEDURE   Your health care provider may want you to have blood tests. These tests can help tell how well your kidneys and liver are working. They can also show how well your blood clots.   If you take blood thinners (anticoagulant medicines), ask your health care provider when you should stop taking them.   Make arrangements for someone to drive you home. This is necessary if you have been sedated for your procedure.  PROCEDURE  Port insertion usually takes about 30-45 minutes.   An IV needle will be inserted in your arm.  Medicine for pain and medicine to help relax you (sedative) will flow directly into your body through this needle.   You will lie on an exam table, and you will be connected to monitors to keep track of your heart rate, blood pressure, and breathing throughout the procedure.  An oxygen monitoring device may be attached to your finger. Oxygen will be given.   Everything will be kept as germ free (sterile) as possible during the procedure. The skin near the point of the incision will be cleansed with antiseptic, and the area will be draped with sterile towels. The skin and deeper tissues over the port area will be made numb with a local anesthetic.  Two small cuts (incisions) will be made in the skin to insert the port. One will be made in the neck to obtain access to the vein where the catheter will lie.   Because the port reservoir will be placed under the skin, a small skin incision will be made in the upper chest, and a small pocket for the port will be made under the skin. The catheter that will be connected to the port tunnels to a large central vein in the chest. A small, raised area will remain on your body at the site of the reservoir when the procedure is complete.  The port placement will be done under imaging guidance to ensure the proper placement.  The reservoir has a silicone covering that can be punctured with a special needle.   The port will be flushed with normal   saline, and blood will be drawn to make sure it is working properly.  There will be nothing remaining outside the skin when the procedure is finished.   Incisions will be held together by stitches, surgical glue, or a special tape. AFTER THE PROCEDURE  You will stay in a recovery area until the anesthesia has worn off. Your blood pressure and pulse will be checked.  A final chest X-ray will be taken to check the placement of the port and to ensure that there is no injury to your lung.   This information is  not intended to replace advice given to you by your health care provider. Make sure you discuss any questions you have with your health care provider.   Document Released: 08/09/2013 Document Revised: 11/09/2014 Document Reviewed: 08/09/2013 Elsevier Interactive Patient Education 2016 Elsevier Inc.  

## 2015-11-08 ENCOUNTER — Telehealth: Payer: Self-pay | Admitting: Hematology & Oncology

## 2015-11-08 ENCOUNTER — Telehealth: Payer: Self-pay | Admitting: Family

## 2015-11-08 NOTE — Telephone Encounter (Signed)
Brien [id number] Requesting Provider: Dr. Annye Asa Treating Provider: Dr. Burney Gauze Visits: 6 Status: Approved Dates: 11/06/2015 - 05/04/2016      COPY SCANNED

## 2015-11-08 NOTE — Telephone Encounter (Signed)
Spoke with Jacqueline Hahn and went over her CT scan results and progression of peritoneal carcinomatosis. We will move her follow-up appointment to next week with Dr. Marin Olp to discuss change in therapy. All questions were answered and she is in agreement with this.

## 2015-11-14 ENCOUNTER — Ambulatory Visit (HOSPITAL_BASED_OUTPATIENT_CLINIC_OR_DEPARTMENT_OTHER): Payer: Commercial Managed Care - HMO | Admitting: Family

## 2015-11-14 ENCOUNTER — Other Ambulatory Visit (HOSPITAL_BASED_OUTPATIENT_CLINIC_OR_DEPARTMENT_OTHER): Payer: Commercial Managed Care - HMO

## 2015-11-14 ENCOUNTER — Telehealth: Payer: Self-pay | Admitting: *Deleted

## 2015-11-14 ENCOUNTER — Ambulatory Visit (HOSPITAL_BASED_OUTPATIENT_CLINIC_OR_DEPARTMENT_OTHER): Payer: Commercial Managed Care - HMO

## 2015-11-14 VITALS — BP 142/79 | HR 111 | Temp 97.9°F | Wt 114.1 lb

## 2015-11-14 DIAGNOSIS — C786 Secondary malignant neoplasm of retroperitoneum and peritoneum: Secondary | ICD-10-CM

## 2015-11-14 DIAGNOSIS — Z452 Encounter for adjustment and management of vascular access device: Secondary | ICD-10-CM

## 2015-11-14 DIAGNOSIS — C259 Malignant neoplasm of pancreas, unspecified: Secondary | ICD-10-CM | POA: Diagnosis not present

## 2015-11-14 DIAGNOSIS — R634 Abnormal weight loss: Secondary | ICD-10-CM

## 2015-11-14 DIAGNOSIS — C7800 Secondary malignant neoplasm of unspecified lung: Principal | ICD-10-CM

## 2015-11-14 DIAGNOSIS — E876 Hypokalemia: Secondary | ICD-10-CM

## 2015-11-14 LAB — CBC WITH DIFFERENTIAL (CANCER CENTER ONLY)
BASO#: 0 10*3/uL (ref 0.0–0.2)
BASO%: 0.6 % (ref 0.0–2.0)
EOS ABS: 0.2 10*3/uL (ref 0.0–0.5)
EOS%: 3.2 % (ref 0.0–7.0)
HCT: 32.9 % — ABNORMAL LOW (ref 34.8–46.6)
HGB: 10.8 g/dL — ABNORMAL LOW (ref 11.6–15.9)
LYMPH#: 2 10*3/uL (ref 0.9–3.3)
LYMPH%: 28.8 % (ref 14.0–48.0)
MCH: 24.5 pg — AB (ref 26.0–34.0)
MCHC: 32.8 g/dL (ref 32.0–36.0)
MCV: 75 fL — ABNORMAL LOW (ref 81–101)
MONO#: 2.2 10*3/uL — ABNORMAL HIGH (ref 0.1–0.9)
MONO%: 31.8 % — AB (ref 0.0–13.0)
NEUT#: 2.5 10*3/uL (ref 1.5–6.5)
NEUT%: 35.6 % — AB (ref 39.6–80.0)
PLATELETS: 411 10*3/uL — AB (ref 145–400)
RBC: 4.41 10*6/uL (ref 3.70–5.32)
RDW: 15.8 % — ABNORMAL HIGH (ref 11.1–15.7)
WBC: 6.9 10*3/uL (ref 3.9–10.0)

## 2015-11-14 LAB — COMPREHENSIVE METABOLIC PANEL
ALT: 130 U/L — ABNORMAL HIGH (ref 0–55)
AST: 79 U/L — AB (ref 5–34)
Albumin: 3.1 g/dL — ABNORMAL LOW (ref 3.5–5.0)
Alkaline Phosphatase: 356 U/L — ABNORMAL HIGH (ref 40–150)
Anion Gap: 12 mEq/L — ABNORMAL HIGH (ref 3–11)
BILIRUBIN TOTAL: 0.33 mg/dL (ref 0.20–1.20)
BUN: 11.2 mg/dL (ref 7.0–26.0)
CO2: 20 meq/L — AB (ref 22–29)
Calcium: 10 mg/dL (ref 8.4–10.4)
Chloride: 109 mEq/L (ref 98–109)
Creatinine: 0.7 mg/dL (ref 0.6–1.1)
GLUCOSE: 62 mg/dL — AB (ref 70–140)
POTASSIUM: 2.9 meq/L — AB (ref 3.5–5.1)
SODIUM: 141 meq/L (ref 136–145)
TOTAL PROTEIN: 7.5 g/dL (ref 6.4–8.3)

## 2015-11-14 LAB — LACTATE DEHYDROGENASE: LDH: 277 U/L — ABNORMAL HIGH (ref 125–245)

## 2015-11-14 LAB — TECHNOLOGIST REVIEW CHCC SATELLITE

## 2015-11-14 MED ORDER — PANCRELIPASE (LIP-PROT-AMYL) 36000-114000 UNITS PO CPEP: 36000.0000 [IU] | ORAL_CAPSULE | Freq: Three times a day (TID) | ORAL | Status: AC

## 2015-11-14 MED ORDER — SODIUM CHLORIDE 0.9 % IJ SOLN
10.0000 mL | INTRAMUSCULAR | Status: DC | PRN
  Administered 2015-11-14: 10 mL via INTRAVENOUS
  Filled 2015-11-14: qty 10

## 2015-11-14 MED ORDER — STERILE WATER FOR INJECTION IJ SOLN
INTRAMUSCULAR | Status: AC
  Filled 2015-11-14: qty 10

## 2015-11-14 MED ORDER — ALTEPLASE 2 MG IJ SOLR
2.0000 mg | Freq: Once | INTRAMUSCULAR | Status: AC
  Administered 2015-11-14: 2 mg
  Filled 2015-11-14: qty 2

## 2015-11-14 MED ORDER — POTASSIUM CHLORIDE CRYS ER 20 MEQ PO TBCR: 40.0000 meq | EXTENDED_RELEASE_TABLET | Freq: Two times a day (BID) | ORAL | Status: DC

## 2015-11-14 MED ORDER — ALTEPLASE 2 MG IJ SOLR
INTRAMUSCULAR | Status: AC
  Filled 2015-11-14: qty 2

## 2015-11-14 MED ORDER — HEPARIN SOD (PORK) LOCK FLUSH 100 UNIT/ML IV SOLN
500.0000 [IU] | Freq: Once | INTRAVENOUS | Status: AC
  Administered 2015-11-14: 500 [IU] via INTRAVENOUS
  Filled 2015-11-14: qty 5

## 2015-11-14 MED FILL — CREON DR 36,000 UNITS CAP: 36000 | 30 days supply | Qty: 90 | Fill #0

## 2015-11-14 MED FILL — POTASSIUM CL ER 20 MEQ TABL: 20 | 30 days supply | Qty: 120 | Fill #0

## 2015-11-14 NOTE — Patient Instructions (Signed)
Alteplase, TPA injection What is this medicine? ALTEPLASE (AL te plase) can dissolve blood clots that form in the heart, blood vessels, or lungs after a heart attack. This medicine is also given to improve recovery and decrease the chance of disability in patients having symptoms of a stroke. This medicine may be used for other purposes; ask your health care provider or pharmacist if you have questions. What should I tell my health care provider before I take this medicine? They need to know if you have any of these conditions: -aneurysm -bleeding problems or problems with blood clotting -blood vessel disease or damaged blood vessels -diabetic retinopathy -head injury or tumor -high blood pressure -infection -irregular heartbeats -previous stroke -recent biopsy or surgery -an unusual or allergic reaction to alteplase, other medicines, foods, dyes, or preservatives -pregnant or trying to get pregnant -breast-feeding How should I use this medicine? This medicine is for injection into a vein. It is given by a health care professional in a hospital or clinic setting. Talk to your pediatrician regarding the use of this medicine in children. Special care may be needed. Overdosage: If you think you have taken too much of this medicine contact a poison control center or emergency room at once. NOTE: This medicine is only for you. Do not share this medicine with others. What if I miss a dose? This does not apply. What may interact with this medicine? Do not take this medicine with any of the following medications: -aminocaproic acid -aprotinin -tranexamic acid This medicine may also interact with the following medications: -antiinflammatory drugs, NSAIDs like ibuprofen -aspirin and aspirin-like medicines -blood thinners, like warfarin, heparin or enoxaparin -dipyridamole This list may not describe all possible interactions. Give your health care provider a list of all the medicines, herbs,  non-prescription drugs, or dietary supplements you use. Also tell them if you smoke, drink alcohol, or use illegal drugs. Some items may interact with your medicine. What should I watch for while using this medicine? Your condition will be monitored carefully while you are receiving this medicine. Follow the advice of your doctor or health care professional exactly. You may need bed rest to minimize the risk of bleeding. This medicine can make you bleed more easily. This effect can last for several days. Take special care brushing or flossing your teeth. Do not take aspirin, ibuprofen, or other nonprescription pain relievers during or for several days after alteplase treatment unless otherwise instructed by your doctor or health care professional. You may feel dizzy or lightheaded. To avoid the risk of dizzy or fainting spells, sit or stand up slowly, especially if you are an older patient. What side effects may I notice from receiving this medicine? Side effects that you should report to your doctor or health care professional as soon as possible: -allergic reactions like skin rash, itching or hives, swelling of the face, lips, or tongue -signs and symptoms of bleeding such as bloody or black, tarry stools; red or dark-brown urine; spitting up blood or brown material that looks like coffee grounds; red spots on the skin; unusual bruising or bleeding from the eye, gums, or nose -signs and symptoms of a stroke such as changes in vision; confusion; trouble speaking or understanding; severe headaches; sudden numbness or weakness of the face, arm, or leg; trouble walking; dizziness; loss of balance or coordination -slow or fast heart rate Side effects that usually do not require medical attention (report to your doctor or health care professional if they continue or are bothersome): -  dizziness -fever -nausea, vomiting This list may not describe all possible side effects. Call your doctor for medical  advice about side effects. You may report side effects to FDA at 1-800-FDA-1088. Where should I keep my medicine? This does not apply. You will not be given this medicine to store at home. NOTE: This sheet is a summary. It may not cover all possible information. If you have questions about this medicine, talk to your doctor, pharmacist, or health care provider.    2016, Elsevier/Gold Standard. (2013-02-14 19:16:18) Implanted Port Insertion, Care After Refer to this sheet in the next few weeks. These instructions provide you with information on caring for yourself after your procedure. Your health care provider may also give you more specific instructions. Your treatment has been planned according to current medical practices, but problems sometimes occur. Call your health care provider if you have any problems or questions after your procedure. WHAT TO EXPECT AFTER THE PROCEDURE After your procedure, it is typical to have the following:   Discomfort at the port insertion site. Ice packs to the area will help.  Bruising on the skin over the port. This will subside in 3-4 days. HOME CARE INSTRUCTIONS  After your port is placed, you will get a manufacturer's information card. The card has information about your port. Keep this card with you at all times.   Know what kind of port you have. There are many types of ports available.   Wear a medical alert bracelet in case of an emergency. This can help alert health care workers that you have a port.   The port can stay in for as long as your health care provider believes it is necessary.   A home health care nurse may give medicines and take care of the port.   You or a family member can get special training and directions for giving medicine and taking care of the port at home.  SEEK MEDICAL CARE IF:   Your port does not flush or you are unable to get a blood return.   You have a fever or chills. SEEK IMMEDIATE MEDICAL CARE  IF:  You have new fluid or pus coming from your incision.   You notice a bad smell coming from your incision site.   You have swelling, pain, or more redness at the incision or port site.   You have chest pain or shortness of breath.   This information is not intended to replace advice given to you by your health care provider. Make sure you discuss any questions you have with your health care provider.   Document Released: 08/09/2013 Document Revised: 10/24/2013 Document Reviewed: 08/09/2013 Elsevier Interactive Patient Education [employer].

## 2015-11-14 NOTE — Telephone Encounter (Signed)
Critical Value 2.9 Dr Marin Olp notified. Orders placed. Patient aware.

## 2015-11-14 NOTE — Progress Notes (Signed)
1000  Cathflo placed in portacath since no blood return noted.  1030.  Good blood return noted from port.  Labs drawn

## 2015-11-14 NOTE — Progress Notes (Signed)
Hematology and Oncology Follow Up Visit  Jacqueline Hahn [birth year] 70 y.o. 11/14/2015   Principle Diagnosis:  Metastatic adenocarcinoma of the pancreas  Current Therapy:   Completed 8 cycles of FOLFOX     Interim History: Jacqueline Hahn is here today for follow-up and to discuss a new treatment plan in light of  progression of her peritoneal carcinomatosis on recent CT scan.  She has had some n/v this week. Her weight is down 11 lbs since her last visit. She has not been taking her Megace every day. She has no appetite. She states that she is drinking Boost and Protein shakes daily.  No fever, chills, cough, rash, headache, dizziness, chest pain, palpitations or changes in bladder habits.  She has had no diarrhea.  She has some SOB with exertion and will take breaks as needed until it resolves.  No lymphadenopathy found on exam.  No swelling or tenderness in her extremities at this time. The neuropathy in her feet is unchanged. No falls or syncopal episodes.    Medications:    Medication List       This list is accurate as of: 11/14/15 11:15 AM.  Always use your most recent med list.               amLODipine 10 MG tablet  Commonly known as:  NORVASC  TAKE 1 TABLET (10 MG TOTAL) BY MOUTH DAILY.     CALCIUM + D PO  Take 1 tablet by mouth daily.     cyproheptadine 4 MG tablet  Commonly known as:  PERIACTIN  Take 0.5 tablets (2 mg total) by mouth 3 (three) times daily as needed for allergies.     FLAX PO  Take 1 tablet by mouth daily. Reported on 10/30/2015     GENERLAC 10 GM/15ML Soln  Generic drug:  lactulose (encephalopathy)  Reported on 10/30/2015     glipiZIDE-metformin 5-500 MG tablet  Commonly known as:  METAGLIP  Take 1 tablet by mouth 2 (two) times daily before a meal.     l-methylfolate-B6-B12 3-35-2 MG Tabs tablet  Commonly known as:  METANX  Take 1 tablet by mouth daily.     lactulose 10 GM/15ML solution  Commonly known as:  CHRONULAC  Take 30 mLs  (20 g total) by mouth 3 (three) times daily.     lidocaine-prilocaine cream  Commonly known as:  EMLA  Apply 1 application topically as needed. Apply quarter sized amount to portacath site at least one hour prior to chemo treatment     LINZESS 145 MCG Caps capsule  Generic drug:  Linaclotide  Reported on 10/30/2015     lipase/protease/amylase 36000 UNITS Cpep capsule  Commonly known as:  CREON  Take 1 capsule (36,000 Units total) by mouth 3 (three) times daily with meals.     megestrol 400 MG/10ML suspension  Commonly known as:  MEGACE  Take 800 mg by mouth daily as needed (only uses when stomach is upset or feels stomach "needs" it).     multivitamin with minerals Tabs tablet  Take 1 tablet by mouth daily. Reported on 10/30/2015     naloxegol oxalate 25 MG Tabs tablet  Commonly known as:  MOVANTIK  Take 1 tablet (25 mg total) by mouth daily.     traMADol 50 MG tablet  Commonly known as:  ULTRAM  Take 1-2 tablets (50-100 mg total) by mouth every 8 (eight) hours as needed for moderate pain.        Allergies:  No Known Allergies  Past Medical History, Surgical history, Social history, and Family History were reviewed and updated.  Review of Systems: All other 10 point review of systems is negative.   Physical Exam:  weight is 114 lb 1.3 oz (51.746 kg). Her oral temperature is 97.9 F (36.6 C). Her blood pressure is 142/79 and her pulse is 111.   Wt Readings from Last 3 Encounters:  11/14/15 114 lb 1.3 oz (51.746 kg)  09/25/15 125 lb (56.7 kg)  09/10/15 126 lb (57.153 kg)    Ocular: Sclerae unicteric, pupils equal, round and reactive to light Ear-nose-throat: Oropharynx clear, dentition fair Lymphatic: No cervical supraclavicular or axillary adenopathy Lungs no rales or rhonchi, good excursion bilaterally Heart regular rate and rhythm, no murmur appreciated Abd soft, right lower quadrant tender on palpation, positive bowel sounds, no liver or spleen tip palpated on  exam MSK no focal spinal tenderness, no joint edema Neuro: non-focal, well-oriented, appropriate affect Breasts: Deferred  Lab Results  Component Value Date   WBC 10.7* 10/30/2015   HGB 11.6 10/30/2015   HCT 35.1 10/30/2015   MCV 76* 10/30/2015   PLT 303 10/30/2015   Lab Results  Component Value Date   FERRITIN 205 06/25/2014   IRON 44 06/25/2014   TIBC 301 06/25/2014   UIBC 257 06/25/2014   IRONPCTSAT 15* 06/25/2014   Lab Results  Component Value Date   RBC 4.63 10/30/2015   No results found for: KPAFRELGTCHN, LAMBDASER, KAPLAMBRATIO No results found for: IGGSERUM, IGA, IGMSERUM No results found for: Odetta Pink, SPEI   Chemistry      Component Value Date/Time   NA 141 10/30/2015 0822   NA 138 05/28/2015 1043   K 3.1* 10/30/2015 0822   K 4.5 05/28/2015 1043   CL 109* 10/30/2015 0822   CL 105 05/28/2015 1043   CO2 25 10/30/2015 0822   CO2 22 05/28/2015 1043   BUN 12 10/30/2015 0822   BUN 18 05/28/2015 1043   CREATININE 0.8 10/30/2015 0822   CREATININE 0.75 05/28/2015 1043      Component Value Date/Time   CALCIUM 9.8 10/30/2015 0822   CALCIUM 9.8 05/28/2015 1043   ALKPHOS 227* 10/30/2015 0822   ALKPHOS 97 05/28/2015 1043   AST 67* 10/30/2015 0822   AST 23 05/28/2015 1043   ALT 90* 10/30/2015 0822   ALT 22 05/28/2015 1043   BILITOT 0.50 10/30/2015 0822   BILITOT 0.6 05/28/2015 1043     Impression and Plan: Jacqueline Hahn is 70 year old female with metastatic adenocarcinoma of pancreas. Scans in November showed a nice response to treatment. She is still feeling fatigued and having some mild right abdominal discomfort that comes and goes.  Unfortunately she had progression of her peritoneal carcinomatosis. She will now have an adjustment made to her treatment by Dr. Marin Olp that will include Onivyde. Once this is approved by her insurance company we will get her started.  We will also try her on Creon and see if  this helps with her weight gain. Prescription was sent to her pharmacy. She will try to increase her calorie count and supplement with Boost and protein shakes as needed.  She will contact us with any questions or concerns. We can certainly see her sooner if need be.  This was a shared visit with Dr. Marin Olp and he is in agreement with the aforementioned.    Eliezer Bottom, NP 1/12/201711:15 AM    Addendum:  I saw and examined the  patient with Judson Roch. Unfortunately, she does appear to be progressing. This really is no surprise. She has lost more weight. This, to me, is very troublesome. She is not taking her Megace which has helped her appetite.  Her last CT scan showed progression of peritoneal carcinomatosis. Her CA 19-9 was up to 7420.  She has been through most standard lines of chemotherapy. She finally told her family that she had pancreatic cancer. She has This from them for quite a while.  She still has a good performance status (ECOG 1) so I think she probably could tolerate another line of chemotherapy.  The FDA recently approved liposomal irinotecan for use in combination with 5-FU for metastatic pancreatic cancer.  She's not yet received FOLFOXIRI, which certainly would be reasonable.  We talked her for about 35-40 minutes. She still wants to try therapy. She knows that she has disease as not curable.  We will see back in started next week.  I talked to her about side effects. She understands that she will lose her hair. She may have some more diarrhea. She may have blood counts which might need to be transfused. Fatigue might be a problem. Abdominal pain might also be an issue. She understands all this.  We will is started next week and then see her back for her second cycle of treatment. We will give her 4 cycles of treatment and then repeat her scans. The CA 19-9 Will give [place] a good idea as to how well she is doing.  Lum Keas

## 2015-11-15 LAB — PREALBUMIN: PREALBUMIN: 13 mg/dL (ref 10–36)

## 2015-11-15 LAB — CANCER ANTIGEN 19-9: CA 19-9: 4379 U/mL — ABNORMAL HIGH (ref 0–35)

## 2015-11-15 LAB — CANCER ANTIGEN 19-9 (PARALLEL TESTING): CA 19 9: 7419.2 U/mL — AB (ref <35.0)

## 2015-11-19 ENCOUNTER — Other Ambulatory Visit: Payer: Self-pay

## 2015-11-19 DIAGNOSIS — C7800 Secondary malignant neoplasm of unspecified lung: Principal | ICD-10-CM

## 2015-11-19 DIAGNOSIS — C259 Malignant neoplasm of pancreas, unspecified: Secondary | ICD-10-CM

## 2015-11-19 MED FILL — GLIPIZIDE-METFORMIN 5-500 M: 5-500 | 30 days supply | Qty: 60 | Fill #4

## 2015-11-20 ENCOUNTER — Other Ambulatory Visit: Payer: Commercial Managed Care - HMO

## 2015-11-20 ENCOUNTER — Ambulatory Visit: Payer: Commercial Managed Care - HMO

## 2015-11-20 ENCOUNTER — Telehealth: Payer: Self-pay | Admitting: Hematology & Oncology

## 2015-11-20 ENCOUNTER — Other Ambulatory Visit: Payer: Self-pay | Admitting: *Deleted

## 2015-11-20 DIAGNOSIS — K8689 Other specified diseases of pancreas: Secondary | ICD-10-CM

## 2015-11-20 DIAGNOSIS — C7800 Secondary malignant neoplasm of unspecified lung: Principal | ICD-10-CM

## 2015-11-20 DIAGNOSIS — E876 Hypokalemia: Secondary | ICD-10-CM

## 2015-11-20 DIAGNOSIS — C259 Malignant neoplasm of pancreas, unspecified: Secondary | ICD-10-CM

## 2015-11-20 DIAGNOSIS — I10 Essential (primary) hypertension: Secondary | ICD-10-CM

## 2015-11-20 MED ORDER — AMLODIPINE BESYLATE 10 MG PO TABS: 10.0000 mg | ORAL_TABLET | Freq: Every day | ORAL | Status: AC

## 2015-11-20 MED ORDER — POTASSIUM CHLORIDE CRYS ER 20 MEQ PO TBCR: 40.0000 meq | EXTENDED_RELEASE_TABLET | Freq: Two times a day (BID) | ORAL | Status: DC

## 2015-11-20 MED ORDER — GLIPIZIDE-METFORMIN HCL 5-500 MG PO TABS: 1.0000 | ORAL_TABLET | Freq: Two times a day (BID) | ORAL | Status: AC

## 2015-11-20 NOTE — Telephone Encounter (Signed)
SILVERBACKBrien FewZI:[id number] Visits: 12 Status: Approved Dates: 11/20/2015 - 05/19/2016  Chemo: [id number] - Atropine [id number] - Irinotecan J0640 - Leucovorin J9190 - Fluorouracil J2405 - Ondansetron J1100 - Dexamethasone    COPY SCANNED

## 2015-11-22 ENCOUNTER — Other Ambulatory Visit: Payer: Self-pay | Admitting: *Deleted

## 2015-12-02 ENCOUNTER — Other Ambulatory Visit: Payer: Self-pay | Admitting: *Deleted

## 2015-12-02 ENCOUNTER — Encounter: Payer: Self-pay | Admitting: Family

## 2015-12-02 ENCOUNTER — Telehealth: Payer: Self-pay | Admitting: *Deleted

## 2015-12-02 ENCOUNTER — Ambulatory Visit (HOSPITAL_BASED_OUTPATIENT_CLINIC_OR_DEPARTMENT_OTHER): Payer: Commercial Managed Care - HMO

## 2015-12-02 ENCOUNTER — Other Ambulatory Visit (HOSPITAL_BASED_OUTPATIENT_CLINIC_OR_DEPARTMENT_OTHER): Payer: Commercial Managed Care - HMO

## 2015-12-02 ENCOUNTER — Ambulatory Visit (HOSPITAL_BASED_OUTPATIENT_CLINIC_OR_DEPARTMENT_OTHER): Payer: Commercial Managed Care - HMO | Admitting: Family

## 2015-12-02 VITALS — BP 160/89 | Temp 97.9°F | Resp 16 | Ht 61.0 in | Wt 111.0 lb

## 2015-12-02 DIAGNOSIS — C259 Malignant neoplasm of pancreas, unspecified: Secondary | ICD-10-CM | POA: Diagnosis not present

## 2015-12-02 DIAGNOSIS — N390 Urinary tract infection, site not specified: Secondary | ICD-10-CM

## 2015-12-02 DIAGNOSIS — R531 Weakness: Secondary | ICD-10-CM | POA: Diagnosis not present

## 2015-12-02 DIAGNOSIS — R5383 Other fatigue: Secondary | ICD-10-CM

## 2015-12-02 DIAGNOSIS — C7800 Secondary malignant neoplasm of unspecified lung: Secondary | ICD-10-CM | POA: Diagnosis not present

## 2015-12-02 LAB — URINALYSIS, MICROSCOPIC (CHCC SATELLITE)
Bilirubin (Urine): NEGATIVE
Blood: NEGATIVE
GLUCOSE UR: NEGATIVE mg/dL
KETONES: NEGATIVE mg/dL
Nitrite: NEGATIVE
PH: 7 (ref 4.60–8.00)
PROTEIN: 30 mg/dL
SPECIFIC GRAVITY, URINE: 1.015 (ref 1.003–1.035)
UROBILINOGEN UR: 0.2 mg/dL (ref 0.2–1)

## 2015-12-02 LAB — CBC WITH DIFFERENTIAL (CANCER CENTER ONLY)
BASO#: 0 10*3/uL (ref 0.0–0.2)
BASO%: 0.2 % (ref 0.0–2.0)
EOS%: 1.7 % (ref 0.0–7.0)
Eosinophils Absolute: 0.2 10*3/uL (ref 0.0–0.5)
HCT: 34.9 % (ref 34.8–46.6)
HEMOGLOBIN: 11.6 g/dL (ref 11.6–15.9)
LYMPH#: 1.4 10*3/uL (ref 0.9–3.3)
LYMPH%: 9.9 % — AB (ref 14.0–48.0)
MCH: 23.7 pg — ABNORMAL LOW (ref 26.0–34.0)
MCHC: 33.2 g/dL (ref 32.0–36.0)
MCV: 71 fL — ABNORMAL LOW (ref 81–101)
MONO#: 2 10*3/uL — ABNORMAL HIGH (ref 0.1–0.9)
MONO%: 13.5 % — AB (ref 0.0–13.0)
NEUT%: 74.7 % (ref 39.6–80.0)
NEUTROS ABS: 10.8 10*3/uL — AB (ref 1.5–6.5)
PLATELETS: 540 10*3/uL — AB (ref 145–400)
RBC: 4.89 10*6/uL (ref 3.70–5.32)
RDW: 15.9 % — ABNORMAL HIGH (ref 11.1–15.7)
WBC: 14.5 10*3/uL — AB (ref 3.9–10.0)

## 2015-12-02 LAB — COMPREHENSIVE METABOLIC PANEL
ALBUMIN: 2.7 g/dL — AB (ref 3.5–5.0)
ALT: 32 U/L (ref 0–55)
AST: 33 U/L (ref 5–34)
Alkaline Phosphatase: 260 U/L — ABNORMAL HIGH (ref 40–150)
Anion Gap: 16 mEq/L — ABNORMAL HIGH (ref 3–11)
BILIRUBIN TOTAL: 0.41 mg/dL (ref 0.20–1.20)
BUN: 11.3 mg/dL (ref 7.0–26.0)
CALCIUM: 9.7 mg/dL (ref 8.4–10.4)
CO2: 18 meq/L — AB (ref 22–29)
CREATININE: 0.7 mg/dL (ref 0.6–1.1)
Chloride: 102 mEq/L (ref 98–109)
EGFR: >90 mL/min/{1.73_m2} (ref >90)
GLUCOSE: 110 mg/dL (ref 70–140)
Potassium: 3.5 mEq/L (ref 3.5–5.1)
Sodium: 135 mEq/L — ABNORMAL LOW (ref 136–145)
Total Protein: 7.5 g/dL (ref 6.4–8.3)

## 2015-12-02 MED ORDER — HEPARIN SOD (PORK) LOCK FLUSH 100 UNIT/ML IV SOLN
500.0000 [IU] | Freq: Once | INTRAVENOUS | Status: AC
  Administered 2015-12-02: 500 [IU] via INTRAVENOUS
  Filled 2015-12-02: qty 5

## 2015-12-02 MED ORDER — CIPROFLOXACIN IN D5W 400 MG/200ML IV SOLN
400.0000 mg | Freq: Once | INTRAVENOUS | Status: AC
  Administered 2015-12-02: 400 mg via INTRAVENOUS
  Filled 2015-12-02: qty 200

## 2015-12-02 MED ORDER — AMOXICILLIN-POT CLAVULANATE 875-125 MG PO TABS: 1.0000 | ORAL_TABLET | Freq: Two times a day (BID) | ORAL | Status: DC

## 2015-12-02 MED ORDER — SODIUM CHLORIDE 0.9% FLUSH
10.0000 mL | INTRAVENOUS | Status: DC | PRN
  Administered 2015-12-02: 10 mL via INTRAVENOUS
  Filled 2015-12-02: qty 10

## 2015-12-02 MED ORDER — SODIUM CHLORIDE 0.9 % IV SOLN
Freq: Once | INTRAVENOUS | Status: AC
  Administered 2015-12-02: 12:00:00 via INTRAVENOUS

## 2015-12-02 MED FILL — AMOX-CLAV 875-125 MG TABLET: 875-125 | 5 days supply | Qty: 10 | Fill #0

## 2015-12-02 NOTE — Telephone Encounter (Signed)
Patient c/o difficulty urinating and pain to the R side of her back. She states she may have also had a temperature, but didn't take it. She has no PCP. Spoke to Dr Marin Olp who wants patient to come in today for evaluation with the NP. Patient aware of appointment.

## 2015-12-02 NOTE — Patient Instructions (Signed)

## 2015-12-02 NOTE — Progress Notes (Signed)
Hematology and Oncology Follow Up Visit  [id number] 18-Jul-1946 70 y.o. 12/02/2015   Principle Diagnosis:  Metastatic adenocarcinoma of the pancreas  Current Therapy:   Completed 8 cycles of FOLFOX     Interim History: Ms. Lawton is here today with c/o pain radiating from the right umbilicus around into her right flank. She has not been eating or drinking much and states she has not voided much as a result. Her urine is quite dark.  She is feeling fatigued and weak. Her HR is tachycardic in the 140's. We are currently giving her fluids.  She has quite a few white cells and trace bacteria in her urine indicating a UTI.  No fever, chills, n/v, cough, rash, headache, dizziness, chest pain, palpitations or changes in bladder habits. She still has some SOB with exertion.  No syncopal episodes or falls. No lymphadenopathy found on exam.  The neuropathy in her feet is unchanged.   Medications:    Medication List       This list is accurate as of: 12/02/15 11:27 AM.  Always use your most recent med list.               amLODipine 10 MG tablet  Commonly known as:  NORVASC  Take 1 tablet (10 mg total) by mouth daily. 90 day supply     CALCIUM + D PO  Take 1 tablet by mouth daily.     cyproheptadine 4 MG tablet  Commonly known as:  PERIACTIN  Take 0.5 tablets (2 mg total) by mouth 3 (three) times daily as needed for allergies.     FLAX PO  Take 1 tablet by mouth daily. Reported on 10/30/2015     GENERLAC 10 GM/15ML Soln  Generic drug:  lactulose (encephalopathy)  Reported on 10/30/2015     glipiZIDE-metformin 5-500 MG tablet  Commonly known as:  METAGLIP  Take 1 tablet by mouth 2 (two) times daily before a meal. 90 day supply.     l-methylfolate-B6-B12 3-35-2 MG Tabs tablet  Commonly known as:  METANX  Take 1 tablet by mouth daily.     lactulose 10 GM/15ML solution  Commonly known as:  CHRONULAC  Take 30 mLs (20 g total) by mouth 3 (three) times daily.     lidocaine-prilocaine cream  Commonly known as:  EMLA  Apply 1 application topically as needed. Apply quarter sized amount to portacath site at least one hour prior to chemo treatment     LINZESS 145 MCG Caps capsule  Generic drug:  Linaclotide  Reported on 10/30/2015     lipase/protease/amylase 36000 UNITS Cpep capsule  Commonly known as:  CREON  Take 1 capsule (36,000 Units total) by mouth 3 (three) times daily with meals.     megestrol 400 MG/10ML suspension  Commonly known as:  MEGACE  Take 800 mg by mouth daily as needed (only uses when stomach is upset or feels stomach "needs" it).     multivitamin with minerals Tabs tablet  Take 1 tablet by mouth daily. Reported on 10/30/2015     naloxegol oxalate 25 MG Tabs tablet  Commonly known as:  MOVANTIK  Take 1 tablet (25 mg total) by mouth daily.     potassium chloride SA 20 MEQ tablet  Commonly known as:  K-DUR,KLOR-CON  Take 2 tablets (40 mEq total) by mouth 2 (two) times daily. 90 day supply     traMADol 50 MG tablet  Commonly known as:  ULTRAM  Take 1-2 tablets (50-100  mg total) by mouth every 8 (eight) hours as needed for moderate pain.        Allergies: No Known Allergies  Past Medical History, Surgical history, Social history, and Family History were reviewed and updated.  Review of Systems: All other 10 point review of systems is negative.   Physical Exam:  vitals were not taken for this visit.  Wt Readings from Last 3 Encounters:  11/14/15 114 lb 1.3 oz (51.746 kg)  09/25/15 125 lb (56.7 kg)  09/10/15 126 lb (57.153 kg)    Ocular: Sclerae unicteric, pupils equal, round and reactive to light Ear-nose-throat: Oropharynx clear, dentition fair Lymphatic: No cervical supraclavicular or axillary adenopathy Lungs no rales or rhonchi, good excursion bilaterally Heart regular rate and rhythm, no murmur appreciated Abd soft, tenderness radiating from the right umbilicus around to the right flank, positive bowel  sounds, no liver or spleen tip palpated on exam MSK no focal spinal tenderness, no joint edema Neuro: non-focal, well-oriented, appropriate affect Breasts: Deferred  Lab Results  Component Value Date   WBC 6.9 11/14/2015   HGB 10.8* 11/14/2015   HCT 32.9* 11/14/2015   MCV 75* 11/14/2015   PLT 411* 11/14/2015   Lab Results  Component Value Date   FERRITIN 205 06/25/2014   IRON 44 06/25/2014   TIBC 301 06/25/2014   UIBC 257 06/25/2014   IRONPCTSAT 15* 06/25/2014   Lab Results  Component Value Date   RBC 4.41 11/14/2015   No results found for: KPAFRELGTCHN, LAMBDASER, KAPLAMBRATIO No results found for: IGGSERUM, IGA, IGMSERUM No results found for: Kathrynn Ducking, MSPIKE, SPEI   Chemistry      Component Value Date/Time   NA 141 11/14/2015 0906   NA 141 10/30/2015 0822   NA 138 05/28/2015 1043   K 2.9* 11/14/2015 0906   K 3.1* 10/30/2015 0822   K 4.5 05/28/2015 1043   CL 109* 10/30/2015 0822   CL 105 05/28/2015 1043   CO2 20* 11/14/2015 0906   CO2 25 10/30/2015 0822   CO2 22 05/28/2015 1043   BUN 11.2 11/14/2015 0906   BUN 12 10/30/2015 0822   BUN 18 05/28/2015 1043   CREATININE 0.7 11/14/2015 0906   CREATININE 0.8 10/30/2015 0822   CREATININE 0.75 05/28/2015 1043      Component Value Date/Time   CALCIUM 10.0 11/14/2015 0906   CALCIUM 9.8 10/30/2015 0822   CALCIUM 9.8 05/28/2015 1043   ALKPHOS 356* 11/14/2015 0906   ALKPHOS 227* 10/30/2015 0822   ALKPHOS 97 05/28/2015 1043   AST 79* 11/14/2015 0906   AST 67* 10/30/2015 0822   AST 23 05/28/2015 1043   ALT 130* 11/14/2015 0906   ALT 90* 10/30/2015 0822   ALT 22 05/28/2015 1043   BILITOT 0.33 11/14/2015 0906   BILITOT 0.50 10/30/2015 0822   BILITOT 0.6 05/28/2015 1043     Impression and Plan: Ms. Zucco is 70 year old female with metastatic adenocarcinoma of pancreas. She is here today with fatigue, weakness, tachycardia and right flank pain. She was positive for a  UTI.  We are currently giving her a liter of fluids and with also give her Cipro IV while she is here.  She will then go home on Augmentin 850 mg PO BID for 5 days.  She has an appointment with Dr. Marin Olp on Thursday so we will keep that. She is supposed to start her new treatment with FOLFIRI that day. We will see how she is feeling by then and how  her labs look before proceeding.  She will contact us with any questions or concerns. We can certainly see her sooner if need be.   Eliezer Bottom, NP 1/30/201711:27 AM

## 2015-12-03 ENCOUNTER — Ambulatory Visit: Payer: Medicare Other | Admitting: Family

## 2015-12-03 ENCOUNTER — Other Ambulatory Visit: Payer: Medicare Other

## 2015-12-03 LAB — URINE CULTURE: Organism ID, Bacteria: NO GROWTH

## 2015-12-04 ENCOUNTER — Other Ambulatory Visit (HOSPITAL_BASED_OUTPATIENT_CLINIC_OR_DEPARTMENT_OTHER): Payer: Commercial Managed Care - HMO

## 2015-12-04 ENCOUNTER — Encounter: Payer: Self-pay | Admitting: Hematology & Oncology

## 2015-12-04 ENCOUNTER — Ambulatory Visit (HOSPITAL_BASED_OUTPATIENT_CLINIC_OR_DEPARTMENT_OTHER): Payer: Commercial Managed Care - HMO | Admitting: Hematology & Oncology

## 2015-12-04 ENCOUNTER — Ambulatory Visit (HOSPITAL_BASED_OUTPATIENT_CLINIC_OR_DEPARTMENT_OTHER): Payer: Commercial Managed Care - HMO

## 2015-12-04 ENCOUNTER — Other Ambulatory Visit: Payer: Self-pay | Admitting: *Deleted

## 2015-12-04 VITALS — BP 156/91 | HR 129 | Temp 97.5°F | Resp 16 | Ht 61.0 in | Wt 112.0 lb

## 2015-12-04 DIAGNOSIS — C7800 Secondary malignant neoplasm of unspecified lung: Principal | ICD-10-CM

## 2015-12-04 DIAGNOSIS — I10 Essential (primary) hypertension: Secondary | ICD-10-CM

## 2015-12-04 DIAGNOSIS — C786 Secondary malignant neoplasm of retroperitoneum and peritoneum: Secondary | ICD-10-CM

## 2015-12-04 DIAGNOSIS — C259 Malignant neoplasm of pancreas, unspecified: Secondary | ICD-10-CM

## 2015-12-04 DIAGNOSIS — Z5111 Encounter for antineoplastic chemotherapy: Secondary | ICD-10-CM | POA: Diagnosis not present

## 2015-12-04 DIAGNOSIS — I471 Supraventricular tachycardia: Secondary | ICD-10-CM

## 2015-12-04 DIAGNOSIS — K8689 Other specified diseases of pancreas: Secondary | ICD-10-CM

## 2015-12-04 DIAGNOSIS — R634 Abnormal weight loss: Secondary | ICD-10-CM

## 2015-12-04 LAB — COMPREHENSIVE METABOLIC PANEL
ALT: 26 U/L (ref 0–55)
AST: 27 U/L (ref 5–34)
Albumin: 2.5 g/dL — ABNORMAL LOW (ref 3.5–5.0)
Alkaline Phosphatase: 210 U/L — ABNORMAL HIGH (ref 40–150)
Anion Gap: 13 mEq/L — ABNORMAL HIGH (ref 3–11)
BUN: 6.4 mg/dL — AB (ref 7.0–26.0)
CO2: 19 meq/L — AB (ref 22–29)
Calcium: 9.4 mg/dL (ref 8.4–10.4)
Chloride: 103 mEq/L (ref 98–109)
Creatinine: 0.7 mg/dL (ref 0.6–1.1)
GLUCOSE: 108 mg/dL (ref 70–140)
POTASSIUM: 3.4 meq/L — AB (ref 3.5–5.1)
SODIUM: 136 meq/L (ref 136–145)
Total Bilirubin: 0.39 mg/dL (ref 0.20–1.20)
Total Protein: 7.1 g/dL (ref 6.4–8.3)

## 2015-12-04 LAB — CBC WITH DIFFERENTIAL (CANCER CENTER ONLY)
BASO#: 0 10*3/uL (ref 0.0–0.2)
BASO%: 0.1 % (ref 0.0–2.0)
EOS%: 1.8 % (ref 0.0–7.0)
Eosinophils Absolute: 0.3 10*3/uL (ref 0.0–0.5)
HCT: 33.8 % — ABNORMAL LOW (ref 34.8–46.6)
HGB: 11.2 g/dL — ABNORMAL LOW (ref 11.6–15.9)
LYMPH#: 1.4 10*3/uL (ref 0.9–3.3)
LYMPH%: 9.3 % — AB (ref 14.0–48.0)
MCH: 23.6 pg — ABNORMAL LOW (ref 26.0–34.0)
MCHC: 33.1 g/dL (ref 32.0–36.0)
MCV: 71 fL — ABNORMAL LOW (ref 81–101)
MONO#: 2 10*3/uL — ABNORMAL HIGH (ref 0.1–0.9)
MONO%: 13.5 % — AB (ref 0.0–13.0)
NEUT#: 11.4 10*3/uL — ABNORMAL HIGH (ref 1.5–6.5)
NEUT%: 75.3 % (ref 39.6–80.0)
PLATELETS: 574 10*3/uL — AB (ref 145–400)
RBC: 4.74 10*6/uL (ref 3.70–5.32)
RDW: 16 % — AB (ref 11.1–15.7)
WBC: 15.2 10*3/uL — ABNORMAL HIGH (ref 3.9–10.0)

## 2015-12-04 LAB — TECHNOLOGIST REVIEW CHCC SATELLITE

## 2015-12-04 MED ORDER — SODIUM CHLORIDE 0.9 % IV SOLN
1920.0000 mg/m2 | INTRAVENOUS | Status: DC
  Administered 2015-12-04: 2850 mg via INTRAVENOUS
  Filled 2015-12-04: qty 57

## 2015-12-04 MED ORDER — LORAZEPAM 0.5 MG PO TABS: 0.5000 mg | ORAL_TABLET | Freq: Four times a day (QID) | ORAL | Status: DC | PRN

## 2015-12-04 MED ORDER — ATENOLOL 50 MG PO TABS: 50.0000 mg | ORAL_TABLET | Freq: Every day | ORAL | Status: AC

## 2015-12-04 MED ORDER — LOPERAMIDE HCL 2 MG PO TABS: ORAL_TABLET | ORAL | Status: DC

## 2015-12-04 MED ORDER — FLUOROURACIL CHEMO INJECTION 2.5 GM/50ML
400.0000 mg/m2 | Freq: Once | INTRAVENOUS | Status: AC
  Administered 2015-12-04: 600 mg via INTRAVENOUS
  Filled 2015-12-04: qty 12

## 2015-12-04 MED ORDER — SODIUM CHLORIDE 0.9 % IV SOLN
Freq: Once | INTRAVENOUS | Status: AC
  Administered 2015-12-04: 09:00:00 via INTRAVENOUS

## 2015-12-04 MED ORDER — ATROPINE SULFATE 1 MG/ML IJ SOLN
INTRAMUSCULAR | Status: AC
  Filled 2015-12-04: qty 1

## 2015-12-04 MED ORDER — SODIUM CHLORIDE 0.9 % IV SOLN
Freq: Once | INTRAVENOUS | Status: AC
  Administered 2015-12-04: 10:00:00 via INTRAVENOUS
  Filled 2015-12-04: qty 8

## 2015-12-04 MED ORDER — ATROPINE SULFATE 1 MG/ML IJ SOLN
0.5000 mg | Freq: Once | INTRAMUSCULAR | Status: AC | PRN
Start: 2015-12-04 — End: 2015-12-04
  Administered 2015-12-04: 0.5 mg via INTRAVENOUS

## 2015-12-04 MED ORDER — IRINOTECAN HCL CHEMO INJECTION 100 MG/5ML
161.0000 mg/m2 | Freq: Once | INTRAVENOUS | Status: AC
  Administered 2015-12-04: 240 mg via INTRAVENOUS
  Filled 2015-12-04: qty 12

## 2015-12-04 MED ORDER — PROCHLORPERAZINE MALEATE 10 MG PO TABS: 10.0000 mg | ORAL_TABLET | Freq: Four times a day (QID) | ORAL | Status: DC | PRN

## 2015-12-04 MED ORDER — DEXAMETHASONE 4 MG PO TABS: 8.0000 mg | ORAL_TABLET | Freq: Two times a day (BID) | ORAL | Status: DC

## 2015-12-04 MED ORDER — SODIUM CHLORIDE 0.9 % IJ SOLN
10.0000 mL | INTRAMUSCULAR | Status: DC | PRN
  Filled 2015-12-04: qty 10

## 2015-12-04 MED ORDER — LEUCOVORIN CALCIUM INJECTION 100 MG
20.0000 mg/m2 | Freq: Once | INTRAMUSCULAR | Status: AC
  Administered 2015-12-04: 30 mg via INTRAVENOUS
  Filled 2015-12-04: qty 1.5

## 2015-12-04 MED ORDER — HEPARIN SOD (PORK) LOCK FLUSH 100 UNIT/ML IV SOLN
500.0000 [IU] | Freq: Once | INTRAVENOUS | Status: DC | PRN
  Filled 2015-12-04: qty 5

## 2015-12-04 MED ORDER — ONDANSETRON HCL 8 MG PO TABS: 8.0000 mg | ORAL_TABLET | Freq: Two times a day (BID) | ORAL | Status: DC

## 2015-12-04 NOTE — Patient Instructions (Signed)
[facility] Discharge Instructions for Patients Receiving Chemotherapy  Today you received the following chemotherapy agents Irinotecan, Fluorouracil, and Leucovorin.  To help prevent nausea and vomiting after your treatment, we encourage you to take your nausea medications as directed on the bottle, I have call these in at the [facility], please pick up the Immodium for diarrhea.   If you develop nausea and vomiting that is not controlled by your nausea medication, call the clinic.   BELOW ARE SYMPTOMS THAT SHOULD BE REPORTED IMMEDIATELY:  *FEVER GREATER THAN 100.5 F  *CHILLS WITH OR WITHOUT FEVER  NAUSEA AND VOMITING THAT IS NOT CONTROLLED WITH YOUR NAUSEA MEDICATION  *UNUSUAL SHORTNESS OF BREATH  *UNUSUAL BRUISING OR BLEEDING  TENDERNESS IN MOUTH AND THROAT WITH OR WITHOUT PRESENCE OF ULCERS  *URINARY PROBLEMS  *BOWEL PROBLEMS  UNUSUAL RASH Items with * indicate a potential emergency and should be followed up as soon as possible.  Feel free to call the clinic you have any questions or concerns. The clinic phone number is [phone].  Please show the [facility] at check-in to the Emergency Department and triage nurse.  Leucovorin injection What is this medicine? LEUCOVORIN (loo koe VOR in) is used to prevent or treat the harmful effects of some medicines. This medicine is used to treat anemia caused by a low amount of folic acid in the body. It is also used with 5-fluorouracil (5-FU) to treat colon cancer. This medicine may be used for other purposes; ask your health care provider or pharmacist if you have questions. What should I tell my health care provider before I take this medicine? They need to know if you have any of these conditions: -anemia from low levels of vitamin B-12 in the blood -an unusual or allergic reaction to leucovorin, folic acid, other medicines, foods, dyes, or preservatives -pregnant or trying to get  pregnant -breast-feeding How should I use this medicine? This medicine is for injection into a muscle or into a vein. It is given by a health care professional in a hospital or clinic setting. Talk to your pediatrician regarding the use of this medicine in children. Special care may be needed. Overdosage: If you think you have taken too much of this medicine contact a poison control center or emergency room at once. NOTE: This medicine is only for you. Do not share this medicine with others. What if I miss a dose? This does not apply. What may interact with this medicine? -capecitabine -fluorouracil -phenobarbital -phenytoin -primidone -trimethoprim-sulfamethoxazole This list may not describe all possible interactions. Give your health care provider a list of all the medicines, herbs, non-prescription drugs, or dietary supplements you use. Also tell them if you smoke, drink alcohol, or use illegal drugs. Some items may interact with your medicine. What should I watch for while using this medicine? Your condition will be monitored carefully while you are receiving this medicine. This medicine may increase the side effects of 5-fluorouracil, 5-FU. Tell your doctor or health care professional if you have diarrhea or mouth sores that do not get better or that get worse. What side effects may I notice from receiving this medicine? Side effects that you should report to your doctor or health care professional as soon as possible: -allergic reactions like skin rash, itching or hives, swelling of the face, lips, or tongue -breathing problems -fever, infection -mouth sores -unusual bleeding or bruising -unusually weak or tired Side effects that usually do not require medical attention (report  to your doctor or health care professional if they continue or are bothersome): -constipation or diarrhea -loss of appetite -nausea, vomiting This list may not describe all possible side effects. Call  your doctor for medical advice about side effects. You may report side effects to FDA at 1-800-FDA-1088. Where should I keep my medicine? This drug is given in a hospital or clinic and will not be stored at home. NOTE: This sheet is a summary. It may not cover all possible information. If you have questions about this medicine, talk to your doctor, pharmacist, or health care provider.    2016, [employer]vier/[employer] Standard. (2008-04-24 16:50:29) Fluorouracil, 5-FU injection What is this medicine? FLUOROURACIL, 5-FU (flure oh YOOR a sil) is a chemotherapy drug. It slows the growth of cancer cells. This medicine is used to treat many types of cancer like breast cancer, colon or rectal cancer, pancreatic cancer, and stomach cancer. This medicine may be used for other purposes; ask your health care provider or pharmacist if you have questions. What should I tell my health care provider before I take this medicine? They need to know if you have any of these conditions: -blood disorders -dihydropyrimidine dehydrogenase (DPD) deficiency -infection (especially a virus infection such as chickenpox, cold sores, or herpes) -kidney disease -liver disease -malnourished, poor nutrition -recent or ongoing radiation therapy -an unusual or allergic reaction to fluorouracil, other chemotherapy, other medicines, foods, dyes, or preservatives -pregnant or trying to get pregnant -breast-feeding How should I use this medicine? This drug is given as an infusion or injection into a vein. It is administered in a hospital or clinic by a specially trained health care professional. Talk to your pediatrician regarding the use of this medicine in children. Special care may be needed. Overdosage: If you think you have taken too much of this medicine contact a poison control center or emergency room at once. NOTE: This medicine is only for you. Do not share this medicine with others. What if I miss a dose? It is important not  to miss your dose. Call your doctor or health care professional if you are unable to keep an appointment. What may interact with this medicine? -allopurinol -cimetidine -dapsone -digoxin -hydroxyurea -leucovorin -levamisole -medicines for seizures like ethotoin, fosphenytoin, phenytoin -medicines to increase blood counts like filgrastim, pegfilgrastim, sargramostim -medicines that treat or prevent blood clots like warfarin, enoxaparin, and dalteparin -methotrexate -metronidazole -pyrimethamine -some other chemotherapy drugs like busulfan, cisplatin, estramustine, vinblastine -trimethoprim -trimetrexate -vaccines Talk to your doctor or health care professional before taking any of these medicines: -acetaminophen -aspirin -ibuprofen -ketoprofen -naproxen This list may not describe all possible interactions. Give your health care provider a list of all the medicines, herbs, non-prescription drugs, or dietary supplements you use. Also tell them if you smoke, drink alcohol, or use illegal drugs. Some items may interact with your medicine. What should I watch for while using this medicine? Visit your doctor for checks on your progress. This drug may make you feel generally unwell. This is not uncommon, as chemotherapy can affect healthy cells as well as cancer cells. Report any side effects. Continue your course of treatment even though you feel ill unless your doctor tells you to stop. In some cases, you may be given additional medicines to help with side effects. Follow all directions for their use. Call your doctor or health care professional for advice if you get a fever, chills or sore throat, or other symptoms of a cold or flu. Do not treat yourself. This drug  decreases your body's ability to fight infections. Try to avoid being around people who are sick. This medicine may increase your risk to bruise or bleed. Call your doctor or health care professional if you notice any unusual  bleeding. Be careful brushing and flossing your teeth or using a toothpick because you may get an infection or bleed more easily. If you have any dental work done, tell your dentist you are receiving this medicine. Avoid taking products that contain aspirin, acetaminophen, ibuprofen, naproxen, or ketoprofen unless instructed by your doctor. These medicines may hide a fever. Do not become pregnant while taking this medicine. Women should inform their doctor if they wish to become pregnant or think they might be pregnant. There is a potential for serious side effects to an unborn child. Talk to your health care professional or pharmacist for more information. Do not breast-feed an infant while taking this medicine. Men should inform their doctor if they wish to father a child. This medicine may lower sperm counts. Do not treat diarrhea with over the counter products. Contact your doctor if you have diarrhea that lasts more than 2 days or if it is severe and watery. This medicine can make you more sensitive to the sun. Keep out of the sun. If you cannot avoid being in the sun, wear protective clothing and use sunscreen. Do not use sun lamps or tanning beds/booths. What side effects may I notice from receiving this medicine? Side effects that you should report to your doctor or health care professional as soon as possible: -allergic reactions like skin rash, itching or hives, swelling of the face, lips, or tongue -low blood counts - this medicine may decrease the number of white blood cells, red blood cells and platelets. You may be at increased risk for infections and bleeding. -signs of infection - fever or chills, cough, sore throat, pain or difficulty passing urine -signs of decreased platelets or bleeding - bruising, pinpoint red spots on the skin, black, tarry stools, blood in the urine -signs of decreased red blood cells - unusually weak or tired, fainting spells, lightheadedness -breathing  problems -changes in vision -chest pain -mouth sores -nausea and vomiting -pain, swelling, redness at site where injected -pain, tingling, numbness in the hands or feet -redness, swelling, or sores on hands or feet -stomach pain -unusual bleeding Side effects that usually do not require medical attention (report to your doctor or health care professional if they continue or are bothersome): -changes in finger or toe nails -diarrhea -dry or itchy skin -hair loss -headache -loss of appetite -sensitivity of eyes to the light -stomach upset -unusually teary eyes This list may not describe all possible side effects. Call your doctor for medical advice about side effects. You may report side effects to FDA at 1-800-FDA-1088. Where should I keep my medicine? This drug is given in a hospital or clinic and will not be stored at home. NOTE: This sheet is a summary. It may not cover all possible information. If you have questions about this medicine, talk to your doctor, pharmacist, or health care provider.    2016, [employer] Standard. (2008-02-22 13:53:16) Irinotecan injection What is this medicine? IRINOTECAN (ir in oh TEE kan ) is a chemotherapy drug. It is used to treat colon and rectal cancer. This medicine may be used for other purposes; ask your health care provider or pharmacist if you have questions. What should I tell my health care provider before I take this medicine? They need to know if  you have any of these conditions: -blood disorders -dehydration -diarrhea -infection (especially a virus infection such as chickenpox, cold sores, or herpes) -liver disease -low blood counts, like low white cell, platelet, or red cell counts -recent or ongoing radiation therapy -an unusual or allergic reaction to irinotecan, sorbitol, other chemotherapy, other medicines, foods, dyes, or preservatives -pregnant or trying to get pregnant -breast-feeding How should I use this  medicine? This drug is given as an infusion into a vein. It is administered in a hospital or clinic by a specially trained health care professional. Talk to your pediatrician regarding the use of this medicine in children. Special care may be needed. Overdosage: If you think you have taken too much of this medicine contact a poison control center or emergency room at once. NOTE: This medicine is only for you. Do not share this medicine with others. What if I miss a dose? It is important not to miss your dose. Call your doctor or health care professional if you are unable to keep an appointment. What may interact with this medicine? Do not take this medicine with any of the following medications: -atazanavir -certain medicines for fungal infections like itraconazole and ketoconazole -St. John's Wort This medicine may also interact with the following medications: -dexamethasone -diuretics -laxatives -medicines for seizures like carbamazepine, mephobarbital, phenobarbital, phenytoin, primidone -medicines to increase blood counts like filgrastim, pegfilgrastim, sargramostim -prochlorperazine -vaccines This list may not describe all possible interactions. Give your health care provider a list of all the medicines, herbs, non-prescription drugs, or dietary supplements you use. Also tell them if you smoke, drink alcohol, or use illegal drugs. Some items may interact with your medicine. What should I watch for while using this medicine? Your condition will be monitored carefully while you are receiving this medicine. You will need important blood work done while you are taking this medicine. This drug may make you feel generally unwell. This is not uncommon, as chemotherapy can affect healthy cells as well as cancer cells. Report any side effects. Continue your course of treatment even though you feel ill unless your doctor tells you to stop. In some cases, you may be given additional medicines to  help with side effects. Follow all directions for their use. You may get drowsy or dizzy. Do not drive, use machinery, or do anything that needs mental alertness until you know how this medicine affects you. Do not stand or sit up quickly, especially if you are an older patient. This reduces the risk of dizzy or fainting spells. Call your doctor or health care professional for advice if you get a fever, chills or sore throat, or other symptoms of a cold or flu. Do not treat yourself. This drug decreases your body's ability to fight infections. Try to avoid being around people who are sick. This medicine may increase your risk to bruise or bleed. Call your doctor or health care professional if you notice any unusual bleeding. Be careful brushing and flossing your teeth or using a toothpick because you may get an infection or bleed more easily. If you have any dental work done, tell your dentist you are receiving this medicine. Avoid taking products that contain aspirin, acetaminophen, ibuprofen, naproxen, or ketoprofen unless instructed by your doctor. These medicines may hide a fever. Do not become pregnant while taking this medicine. Women should inform their doctor if they wish to become pregnant or think they might be pregnant. There is a potential for serious side effects to an unborn child.  Talk to your health care professional or pharmacist for more information. Do not breast-feed an infant while taking this medicine. What side effects may I notice from receiving this medicine? Side effects that you should report to your doctor or health care professional as soon as possible: -allergic reactions like skin rash, itching or hives, swelling of the face, lips, or tongue -low blood counts - this medicine may decrease the number of white blood cells, red blood cells and platelets. You may be at increased risk for infections and bleeding. -signs of infection - fever or chills, cough, sore throat, pain or  difficulty passing urine -signs of decreased platelets or bleeding - bruising, pinpoint red spots on the skin, black, tarry stools, blood in the urine -signs of decreased red blood cells - unusually weak or tired, fainting spells, lightheadedness -breathing problems -chest pain -diarrhea -feeling faint or lightheaded, falls -flushing, runny nose, sweating during infusion -mouth sores or pain -pain, swelling, redness or irritation where injected -pain, swelling, warmth in the leg -pain, tingling, numbness in the hands or feet -problems with balance, talking, walking -stomach cramps, pain -trouble passing urine or change in the amount of urine -vomiting as to be unable to hold down drinks or food -yellowing of the eyes or skin Side effects that usually do not require medical attention (report to your doctor or health care professional if they continue or are bothersome): -constipation -hair loss -headache -loss of appetite -nausea, vomiting -stomach upset This list may not describe all possible side effects. Call your doctor for medical advice about side effects. You may report side effects to FDA at 1-800-FDA-1088. Where should I keep my medicine? This drug is given in a hospital or clinic and will not be stored at home. NOTE: This sheet is a summary. It may not cover all possible information. If you have questions about this medicine, talk to your doctor, pharmacist, or health care provider.    2016, [employer] Standard. (2013-04-17 16:29:32)

## 2015-12-04 NOTE — Progress Notes (Signed)
Hematology and Oncology Follow Up Visit  Jacqueline Hahn [id number] [birth year] 70 y.o. 12/04/2015   Principle Diagnosis:  Metastatic adenocarcinoma of the pancreas  Current Therapy:   Completed 8 cycles of FOLFOX  FOLFIRI - To start today    Interim History: Jacqueline Hahn is here today she is losing some weight. This really troubles me. Her weight is down to 112 pounds. 2 months ago, she weighed about 125 pounds.  Her last CT scan showed some disease progression. It looked like she may have had some carcinomatous peritonitis.  Her CA 19-9 has been going up. 2 weeks ago, it was 7400.  We have been treating her now for almost a year and a half area and she has done incredibly well. However, I really think that her cancer is becoming more difficult to treat.  I think that we're going to have to make a change. She has not had FOLFIRI. I think this would be reasonable for her.  She really has to gain some more weight. She is on Megace. I'm not sure how often she is taking this. I do have her on some Creon.  She's had no fever. She's had no bleeding. She's had no diarrhea or constipation.   Overall, her performance status is ECOG 1.      Medications:    Medication List       This list is accurate as of: 12/04/15  9:27 AM.  Always use your most recent med list.               amLODipine 10 MG tablet  Commonly known as:  NORVASC  Take 1 tablet (10 mg total) by mouth daily. 90 day supply     amoxicillin-clavulanate 875-125 MG tablet  Commonly known as:  AUGMENTIN  Take 1 tablet by mouth 2 (two) times daily.     CALCIUM + D PO  Take 1 tablet by mouth daily.     cyproheptadine 4 MG tablet  Commonly known as:  PERIACTIN  Take 0.5 tablets (2 mg total) by mouth 3 (three) times daily as needed for allergies.     dexamethasone 4 MG tablet  Commonly known as:  DECADRON  Take 2 tablets (8 mg total) by mouth 2 (two) times daily with a meal. Start the day after chemotherapy for 3 days.  Take with food.     FLAX PO  Take 1 tablet by mouth daily. Reported on 10/30/2015     GENERLAC 10 GM/15ML Soln  Generic drug:  lactulose (encephalopathy)  Reported on 10/30/2015     glipiZIDE-metformin 5-500 MG tablet  Commonly known as:  METAGLIP  Take 1 tablet by mouth 2 (two) times daily before a meal. 90 day supply.     l-methylfolate-B6-B12 3-35-2 MG Tabs tablet  Commonly known as:  METANX  Take 1 tablet by mouth daily.     lactulose 10 GM/15ML solution  Commonly known as:  CHRONULAC  Take 30 mLs (20 g total) by mouth 3 (three) times daily.     lidocaine-prilocaine cream  Commonly known as:  EMLA  Apply 1 application topically as needed. Apply quarter sized amount to portacath site at least one hour prior to chemo treatment     LINZESS 145 MCG Caps capsule  Generic drug:  Linaclotide  Reported on 10/30/2015     lipase/protease/amylase 36000 UNITS Cpep capsule  Commonly known as:  CREON  Take 1 capsule (36,000 Units total) by mouth 3 (three) times daily with meals.  loperamide 2 MG tablet  Commonly known as:  IMODIUM A-D  Take 2 at onset of diarrhea, then 1 every 2hrs until 12hr without a BM. May take 2 tab every 4hrs at bedtime. If diarrhea recurs repeat.     LORazepam 0.5 MG tablet  Commonly known as:  ATIVAN  Take 1 tablet (0.5 mg total) by mouth every 6 (six) hours as needed for anxiety.     megestrol 400 MG/10ML suspension  Commonly known as:  MEGACE  Take 800 mg by mouth daily as needed (only uses when stomach is upset or feels stomach "needs" it).     multivitamin with minerals Tabs tablet  Take 1 tablet by mouth daily. Reported on 10/30/2015     naloxegol oxalate 25 MG Tabs tablet  Commonly known as:  MOVANTIK  Take 1 tablet (25 mg total) by mouth daily.     ondansetron 8 MG tablet  Commonly known as:  ZOFRAN  Take 1 tablet (8 mg total) by mouth 2 (two) times daily. Start the day after chemo for 3 days. Then take as needed for nausea or vomiting.       potassium chloride SA 20 MEQ tablet  Commonly known as:  K-DUR,KLOR-CON  Take 2 tablets (40 mEq total) by mouth 2 (two) times daily. 90 day supply     prochlorperazine 10 MG tablet  Commonly known as:  COMPAZINE  Take 1 tablet (10 mg total) by mouth every 6 (six) hours as needed (Nausea or vomiting).     traMADol 50 MG tablet  Commonly known as:  ULTRAM  Take 1-2 tablets (50-100 mg total) by mouth every 8 (eight) hours as needed for moderate pain.        Allergies: No Known Allergies  Past Medical History, Surgical history, Social history, and Family History were reviewed and updated.  Review of Systems: All other 10 point review of systems is negative.   Physical Exam:  height is 5\' 1"  (1.549 m) and weight is 112 lb (50.803 kg). Her oral temperature is 97.5 F (36.4 C). Her blood pressure is 156/91 and her pulse is 129. Her respiration is 16.   Wt Readings from Last 3 Encounters:  12/04/15 112 lb (50.803 kg)  12/02/15 111 lb (50.349 kg)  11/14/15 114 lb 1.3 oz (51.746 kg)    Normocephalic and atraumatic skull. There is no sclerae Troost. She has no mucositis. There is no adenopathy in the neck. Lungs are clear to percussion and auscultation bilaterally. Cardiac exam tachycardic but regular. She has no murmurs, rubs or bruits. Abdomen is slightly distended. There is no fluid wave. There is no palpable abdominal mass. There is no palpable liver or spleen tip. Back exam shows no tenderness over the spine, ribs or hips. Extremity shows some muscle atrophy in the upper and lower extremity. This is symmetric. She has good range of motion of her joints. Skin exam shows no rashes, ecchymoses or petechia. Neurological exam shows no focal deficits.   Lab Results  Component Value Date   WBC 15.2* 12/04/2015   HGB 11.2* 12/04/2015   HCT 33.8* 12/04/2015   MCV 71* 12/04/2015   PLT 574* 12/04/2015   Lab Results  Component Value Date   FERRITIN 205 06/25/2014   IRON 44 06/25/2014    TIBC 301 06/25/2014   UIBC 257 06/25/2014   IRONPCTSAT 15* 06/25/2014   Lab Results  Component Value Date   RBC 4.74 12/04/2015   No results found for: KPAFRELGTCHN, LAMBDASER, KAPLAMBRATIO  No results found for: IGGSERUM, IGA, IGMSERUM No results found for: Kathrynn Ducking, MSPIKE, SPEI   Chemistry      Component Value Date/Time   NA 135* 12/02/2015 1119   NA 141 10/30/2015 0822   NA 138 05/28/2015 1043   K 3.5 12/02/2015 1119   K 3.1* 10/30/2015 0822   K 4.5 05/28/2015 1043   CL 109* 10/30/2015 0822   CL 105 05/28/2015 1043   CO2 18* 12/02/2015 1119   CO2 25 10/30/2015 0822   CO2 22 05/28/2015 1043   BUN 11.3 12/02/2015 1119   BUN 12 10/30/2015 0822   BUN 18 05/28/2015 1043   CREATININE 0.7 12/02/2015 1119   CREATININE 0.8 10/30/2015 0822   CREATININE 0.75 05/28/2015 1043      Component Value Date/Time   CALCIUM 9.7 12/02/2015 1119   CALCIUM 9.8 10/30/2015 0822   CALCIUM 9.8 05/28/2015 1043   ALKPHOS 260* 12/02/2015 1119   ALKPHOS 227* 10/30/2015 0822   ALKPHOS 97 05/28/2015 1043   AST 33 12/02/2015 1119   AST 67* 10/30/2015 0822   AST 23 05/28/2015 1043   ALT 32 12/02/2015 1119   ALT 90* 10/30/2015 0822   ALT 22 05/28/2015 1043   BILITOT 0.41 12/02/2015 1119   BILITOT 0.50 10/30/2015 0822   BILITOT 0.6 05/28/2015 1043     Impression and Plan: Jacqueline Hahn is 70 year old Afro-American female. She has metastatic pancreatic cancer. We initially saw her back in July 2015.  Again, she's done quite well. However, I do think the weight loss is a sign that things might be coming more resistant to therapy.  We will see how she does with the FOLFIRI. She's quite tachycardic. Her blood pressure is up. I'll try her on some atenolol to see this helps.    We will plan to her back in 2 more weeks. Hopefully, she will gain a little more weight.   I spent about 30 minutes with her today.  [name], MD 2/1/20179:27 AM

## 2015-12-05 ENCOUNTER — Other Ambulatory Visit: Payer: Self-pay | Admitting: *Deleted

## 2015-12-05 DIAGNOSIS — C7800 Secondary malignant neoplasm of unspecified lung: Principal | ICD-10-CM

## 2015-12-05 DIAGNOSIS — K8689 Other specified diseases of pancreas: Secondary | ICD-10-CM

## 2015-12-05 DIAGNOSIS — C259 Malignant neoplasm of pancreas, unspecified: Secondary | ICD-10-CM

## 2015-12-05 MED ORDER — PROCHLORPERAZINE MALEATE 10 MG PO TABS: 10.0000 mg | ORAL_TABLET | Freq: Four times a day (QID) | ORAL | Status: AC | PRN

## 2015-12-05 MED ORDER — ONDANSETRON HCL 8 MG PO TABS: 8.0000 mg | ORAL_TABLET | Freq: Two times a day (BID) | ORAL | Status: AC

## 2015-12-05 MED FILL — PROCHLORPERAZINE 10 MG TAB: 10 | 8 days supply | Qty: 30 | Fill #0

## 2015-12-05 MED FILL — ONDANSETRON HCL 8 MG TABLET: 8 | 15 days supply | Qty: 30 | Fill #0

## 2015-12-06 ENCOUNTER — Ambulatory Visit (HOSPITAL_BASED_OUTPATIENT_CLINIC_OR_DEPARTMENT_OTHER): Payer: Commercial Managed Care - HMO

## 2015-12-06 VITALS — BP 127/69 | HR 130 | Temp 97.8°F | Resp 18

## 2015-12-06 DIAGNOSIS — C259 Malignant neoplasm of pancreas, unspecified: Secondary | ICD-10-CM | POA: Diagnosis not present

## 2015-12-06 DIAGNOSIS — C7800 Secondary malignant neoplasm of unspecified lung: Secondary | ICD-10-CM

## 2015-12-06 DIAGNOSIS — K8689 Other specified diseases of pancreas: Secondary | ICD-10-CM

## 2015-12-06 MED ORDER — HEPARIN SOD (PORK) LOCK FLUSH 100 UNIT/ML IV SOLN
500.0000 [IU] | Freq: Once | INTRAVENOUS | Status: AC | PRN
  Administered 2015-12-06: 500 [IU]
  Filled 2015-12-06: qty 5

## 2015-12-06 MED ORDER — SODIUM CHLORIDE 0.9 % IJ SOLN
10.0000 mL | INTRAMUSCULAR | Status: DC | PRN
  Administered 2015-12-06: 10 mL
  Filled 2015-12-06: qty 10

## 2015-12-06 NOTE — Patient Instructions (Signed)

## 2015-12-10 ENCOUNTER — Ambulatory Visit (HOSPITAL_BASED_OUTPATIENT_CLINIC_OR_DEPARTMENT_OTHER): Payer: Commercial Managed Care - HMO

## 2015-12-10 ENCOUNTER — Other Ambulatory Visit: Payer: Self-pay | Admitting: *Deleted

## 2015-12-10 ENCOUNTER — Other Ambulatory Visit: Payer: Self-pay | Admitting: Family

## 2015-12-10 ENCOUNTER — Ambulatory Visit (HOSPITAL_BASED_OUTPATIENT_CLINIC_OR_DEPARTMENT_OTHER)
Admission: RE | Admit: 2015-12-10 | Discharge: 2015-12-10 | Disposition: A | Discharge status: Home / Self Care | Payer: Commercial Managed Care - HMO | Source: Ambulatory Visit | Attending: Hematology & Oncology | Admitting: Hematology & Oncology

## 2015-12-10 ENCOUNTER — Telehealth: Payer: Self-pay | Admitting: *Deleted

## 2015-12-10 ENCOUNTER — Ambulatory Visit (HOSPITAL_BASED_OUTPATIENT_CLINIC_OR_DEPARTMENT_OTHER): Payer: Commercial Managed Care - HMO | Admitting: Nurse Practitioner

## 2015-12-10 VITALS — BP 158/94 | HR 131 | Temp 98.2°F | Resp 18

## 2015-12-10 DIAGNOSIS — C259 Malignant neoplasm of pancreas, unspecified: Secondary | ICD-10-CM

## 2015-12-10 DIAGNOSIS — C7800 Secondary malignant neoplasm of unspecified lung: Principal | ICD-10-CM

## 2015-12-10 DIAGNOSIS — R1110 Vomiting, unspecified: Secondary | ICD-10-CM | POA: Diagnosis present

## 2015-12-10 DIAGNOSIS — R109 Unspecified abdominal pain: Secondary | ICD-10-CM | POA: Insufficient documentation

## 2015-12-10 DIAGNOSIS — K2100 Gastro-esophageal reflux disease with esophagitis, without bleeding: Secondary | ICD-10-CM

## 2015-12-10 DIAGNOSIS — R110 Nausea: Secondary | ICD-10-CM

## 2015-12-10 LAB — COMPREHENSIVE METABOLIC PANEL
ALT: 66 U/L — ABNORMAL HIGH (ref 0–55)
AST: 54 U/L — AB (ref 5–34)
Albumin: 2.5 g/dL — ABNORMAL LOW (ref 3.5–5.0)
Alkaline Phosphatase: 291 U/L — ABNORMAL HIGH (ref 40–150)
Anion Gap: 13 mEq/L — ABNORMAL HIGH (ref 3–11)
BUN: 13.3 mg/dL (ref 7.0–26.0)
CALCIUM: 8.6 mg/dL (ref 8.4–10.4)
CHLORIDE: 101 meq/L (ref 98–109)
CO2: 22 meq/L (ref 22–29)
Creatinine: 0.6 mg/dL (ref 0.6–1.1)
EGFR: >90 mL/min/{1.73_m2} (ref >90)
Glucose: 156 mg/dl — ABNORMAL HIGH (ref 70–140)
POTASSIUM: 3.6 meq/L (ref 3.5–5.1)
Sodium: 136 mEq/L (ref 136–145)
Total Bilirubin: 0.58 mg/dL (ref 0.20–1.20)
Total Protein: 6.4 g/dL (ref 6.4–8.3)

## 2015-12-10 LAB — CBC WITH DIFFERENTIAL (CANCER CENTER ONLY)
BASO#: 0 10*3/uL (ref 0.0–0.2)
BASO%: 0.1 % (ref 0.0–2.0)
EOS ABS: 0 10*3/uL (ref 0.0–0.5)
EOS%: 0.2 % (ref 0.0–7.0)
HEMATOCRIT: 32.7 % — AB (ref 34.8–46.6)
HEMOGLOBIN: 10.7 g/dL — AB (ref 11.6–15.9)
LYMPH#: 0.8 10*3/uL — AB (ref 0.9–3.3)
LYMPH%: 9.4 % — ABNORMAL LOW (ref 14.0–48.0)
MCH: 23.5 pg — AB (ref 26.0–34.0)
MCHC: 32.7 g/dL (ref 32.0–36.0)
MCV: 72 fL — ABNORMAL LOW (ref 81–101)
MONO#: 0.3 10*3/uL (ref 0.1–0.9)
MONO%: 3.7 % (ref 0.0–13.0)
NEUT%: 86.6 % — ABNORMAL HIGH (ref 39.6–80.0)
NEUTROS ABS: 7.2 10*3/uL — AB (ref 1.5–6.5)
Platelets: 256 10*3/uL (ref 145–400)
RBC: 4.55 10*6/uL (ref 3.70–5.32)
RDW: 15.9 % — ABNORMAL HIGH (ref 11.1–15.7)
WBC: 8.3 10*3/uL (ref 3.9–10.0)

## 2015-12-10 MED ORDER — SODIUM CHLORIDE 0.9% FLUSH
10.0000 mL | INTRAVENOUS | Status: DC | PRN
  Administered 2015-12-10: 10 mL via INTRAVENOUS
  Filled 2015-12-10: qty 10

## 2015-12-10 MED ORDER — METOCLOPRAMIDE HCL 5 MG/ML IJ SOLN
10.0000 mg | Freq: Once | INTRAMUSCULAR | Status: AC
  Administered 2015-12-10: 10 mg via INTRAVENOUS

## 2015-12-10 MED ORDER — KETOROLAC TROMETHAMINE 15 MG/ML IJ SOLN
15.0000 mg | Freq: Once | INTRAMUSCULAR | Status: AC
  Administered 2015-12-10: 15 mg via INTRAVENOUS

## 2015-12-10 MED ORDER — METOCLOPRAMIDE HCL 5 MG/ML IJ SOLN: 20.0000 mg | Freq: Once | INTRAVENOUS | Status: DC

## 2015-12-10 MED ORDER — SODIUM CHLORIDE 0.9 % IV SOLN
INTRAVENOUS | Status: DC
  Administered 2015-12-10: 12:00:00 via INTRAVENOUS

## 2015-12-10 MED ORDER — METOCLOPRAMIDE HCL 10 MG PO TABS: 10.0000 mg | ORAL_TABLET | Freq: Four times a day (QID) | ORAL | Status: DC

## 2015-12-10 MED ORDER — KETOROLAC TROMETHAMINE 15 MG/ML IJ SOLN
INTRAMUSCULAR | Status: AC
  Filled 2015-12-10: qty 1

## 2015-12-10 MED ORDER — HEPARIN SOD (PORK) LOCK FLUSH 100 UNIT/ML IV SOLN
500.0000 [IU] | Freq: Once | INTRAVENOUS | Status: AC
  Administered 2015-12-10: 500 [IU] via INTRAVENOUS
  Filled 2015-12-10: qty 5

## 2015-12-10 MED FILL — METOCLOPRAMIDE 10 MG TABLET: 10 | 30 days supply | Qty: 120 | Fill #0

## 2015-12-10 NOTE — Telephone Encounter (Signed)
Patient c/o intense abdominal pain as well as vomiting for several days. She isn't able to keep foods or medications down. Spoke to Dr Marin Olp who wants patient to have an abdominal series as well as come in for labs and fluids.  Appointment made. Patient aware.

## 2015-12-10 NOTE — Patient Instructions (Signed)
Ileus ° Ileus is a condition in which the intestines, also called the bowels, stop working and moving correctly. If the intestines stop working, food cannot pass through to get digested. The intestines are hollow organs that digest food after the food leaves the stomach. These organs are long, muscular tubes that connect the stomach to the rectum. When ileus occurs, the muscular contractions that cause food to move through the intestines stop happening as they normally would. °Ileus can occur for various reasons. This condition is a serious problem that usually requires hospitalization. It can cause symptoms such as nausea, abdominal pain, and bloating. Ileus can last from a few hours to a few days. If the intestines stop working because of a blockage, that is a different condition that is called a bowel obstruction. °CAUSES °This condition may be caused by: °· Surgery on the abdomen. °· An infection or inflammation in the abdomen. This includes inflammation of the lining of the abdomen (peritonitis). °· Infection or inflammation in other parts of the body, such as pneumonia or pancreatitis. °· Passage of gallstones or kidney stones. °· Damage to the nerves or blood vessels that go to the intestines. °· A collection of blood within the abdominal cavity. °· Imbalance in the salts in the blood (electrolytes). °· Injury to the brain or spinal cord. °· Medicines. Many medicines, including strong pain medicines, can cause ileus or make it worse. °SYMPTOMS °Symptoms of this condition include: °· Bloating of the abdomen. °· Pain or discomfort in the abdomen. °· Poor appetite. °· Nausea and vomiting. °· Lack of normal bowel sounds, such as "growling" in the stomach. °DIAGNOSIS °This condition may be diagnosed with: °· A physical exam and medical history. °· X-rays or a CT scan of the abdomen. °You may also have other tests to help find the cause of the condition. °TREATMENT °Treatment for this condition may  include: °· Resting the intestines until they start to work again. This is often done by: °¨ Stopping oral intake of food and drink. You will be given fluid through an IV tube to prevent dehydration. °¨ Placing a small tube (nasogastric tube or NG tube) that is passed through your nose and into your stomach. The tube is attached to a suction device and keeps the stomach emptied out. This allows the bowels to rest and also helps to reduce nausea and vomiting. °· Correcting any electrolyte imbalance by giving supplements in the IV fluid. °· Stopping any medicines that might make ileus worse. °· Treating any condition that may have caused ileus. °HOME CARE INSTRUCTIONS °· Follow instructions from your health care provider about diet and fluid intake. Usually, you will be told to: °¨ Drink plenty of clear fluids. °¨ Avoid alcohol. °¨ Avoid caffeine. °¨ Eat a bland diet. °· Get plenty of rest. Return to your normal activities as told by your health care provider. °· Take over-the-counter and prescription medicines only as told by your health care provider. °· Keep all follow-up visits as told by your health care provider. This is important. °SEEK MEDICAL CARE IF: °· You have nausea, vomiting, or abdominal discomfort. °· You have a fever. °SEEK IMMEDIATE MEDICAL CARE IF: °· You have severe abdominal pain or bloating. °· You cannot eat or drink without vomiting. °  °This information is not intended to replace advice given to you by your health care provider. Make sure you discuss any questions you have with your health care provider. °  °Document Released: 10/22/2003 Document Revised: 07/10/2015 Document   Reviewed: 12/13/2014 °[employer] Interactive Patient Education ©2016 [employer] ° °

## 2015-12-17 ENCOUNTER — Inpatient Hospital Stay (HOSPITAL_COMMUNITY)
Admission: EM | Admit: 2015-12-17 | Discharge: 2015-12-24 | DRG: 300 | Disposition: A | Discharge status: Home / Self Care | Payer: Commercial Managed Care - HMO | Attending: Internal Medicine | Admitting: Internal Medicine

## 2015-12-17 ENCOUNTER — Emergency Department (HOSPITAL_COMMUNITY): Payer: Commercial Managed Care - HMO

## 2015-12-17 ENCOUNTER — Other Ambulatory Visit: Payer: Self-pay | Admitting: Oncology

## 2015-12-17 ENCOUNTER — Telehealth: Payer: Self-pay | Admitting: *Deleted

## 2015-12-17 ENCOUNTER — Encounter (HOSPITAL_COMMUNITY): Payer: Self-pay | Admitting: Emergency Medicine

## 2015-12-17 DIAGNOSIS — D630 Anemia in neoplastic disease: Secondary | ICD-10-CM | POA: Diagnosis present

## 2015-12-17 DIAGNOSIS — Z8249 Family history of ischemic heart disease and other diseases of the circulatory system: Secondary | ICD-10-CM

## 2015-12-17 DIAGNOSIS — C259 Malignant neoplasm of pancreas, unspecified: Secondary | ICD-10-CM

## 2015-12-17 DIAGNOSIS — Z87891 Personal history of nicotine dependence: Secondary | ICD-10-CM

## 2015-12-17 DIAGNOSIS — I82402 Acute embolism and thrombosis of unspecified deep veins of left lower extremity: Secondary | ICD-10-CM | POA: Diagnosis not present

## 2015-12-17 DIAGNOSIS — Z79899 Other long term (current) drug therapy: Secondary | ICD-10-CM | POA: Diagnosis not present

## 2015-12-17 DIAGNOSIS — C7800 Secondary malignant neoplasm of unspecified lung: Secondary | ICD-10-CM | POA: Diagnosis present

## 2015-12-17 DIAGNOSIS — R188 Other ascites: Secondary | ICD-10-CM

## 2015-12-17 DIAGNOSIS — E876 Hypokalemia: Secondary | ICD-10-CM

## 2015-12-17 DIAGNOSIS — E440 Moderate protein-calorie malnutrition: Secondary | ICD-10-CM | POA: Diagnosis present

## 2015-12-17 DIAGNOSIS — R180 Malignant ascites: Secondary | ICD-10-CM

## 2015-12-17 DIAGNOSIS — I82409 Acute embolism and thrombosis of unspecified deep veins of unspecified lower extremity: Secondary | ICD-10-CM | POA: Insufficient documentation

## 2015-12-17 DIAGNOSIS — Z7952 Long term (current) use of systemic steroids: Secondary | ICD-10-CM

## 2015-12-17 DIAGNOSIS — R1110 Vomiting, unspecified: Secondary | ICD-10-CM

## 2015-12-17 DIAGNOSIS — E119 Type 2 diabetes mellitus without complications: Secondary | ICD-10-CM | POA: Diagnosis present

## 2015-12-17 DIAGNOSIS — K5900 Constipation, unspecified: Secondary | ICD-10-CM

## 2015-12-17 DIAGNOSIS — E872 Acidosis: Secondary | ICD-10-CM | POA: Diagnosis present

## 2015-12-17 DIAGNOSIS — I10 Essential (primary) hypertension: Secondary | ICD-10-CM | POA: Diagnosis present

## 2015-12-17 DIAGNOSIS — R110 Nausea: Secondary | ICD-10-CM | POA: Diagnosis not present

## 2015-12-17 DIAGNOSIS — Z87440 Personal history of urinary (tract) infections: Secondary | ICD-10-CM | POA: Diagnosis not present

## 2015-12-17 DIAGNOSIS — Z8711 Personal history of peptic ulcer disease: Secondary | ICD-10-CM

## 2015-12-17 DIAGNOSIS — R112 Nausea with vomiting, unspecified: Secondary | ICD-10-CM

## 2015-12-17 DIAGNOSIS — C772 Secondary and unspecified malignant neoplasm of intra-abdominal lymph nodes: Secondary | ICD-10-CM

## 2015-12-17 DIAGNOSIS — R634 Abnormal weight loss: Secondary | ICD-10-CM | POA: Diagnosis not present

## 2015-12-17 DIAGNOSIS — N179 Acute kidney failure, unspecified: Secondary | ICD-10-CM | POA: Diagnosis present

## 2015-12-17 DIAGNOSIS — Z823 Family history of stroke: Secondary | ICD-10-CM | POA: Diagnosis not present

## 2015-12-17 DIAGNOSIS — Z66 Do not resuscitate: Secondary | ICD-10-CM | POA: Diagnosis present

## 2015-12-17 DIAGNOSIS — E860 Dehydration: Secondary | ICD-10-CM

## 2015-12-17 DIAGNOSIS — R109 Unspecified abdominal pain: Secondary | ICD-10-CM | POA: Diagnosis present

## 2015-12-17 DIAGNOSIS — I82412 Acute embolism and thrombosis of left femoral vein: Principal | ICD-10-CM

## 2015-12-17 DIAGNOSIS — R000 Tachycardia, unspecified: Secondary | ICD-10-CM | POA: Diagnosis present

## 2015-12-17 DIAGNOSIS — Z6821 Body mass index (BMI) 21.0-21.9, adult: Secondary | ICD-10-CM | POA: Diagnosis not present

## 2015-12-17 DIAGNOSIS — Z7189 Other specified counseling: Secondary | ICD-10-CM | POA: Diagnosis not present

## 2015-12-17 DIAGNOSIS — R1084 Generalized abdominal pain: Secondary | ICD-10-CM | POA: Insufficient documentation

## 2015-12-17 DIAGNOSIS — Z515 Encounter for palliative care: Secondary | ICD-10-CM | POA: Diagnosis not present

## 2015-12-17 LAB — URINALYSIS, ROUTINE W REFLEX MICROSCOPIC
Glucose, UA: NEGATIVE mg/dL
Hgb urine dipstick: NEGATIVE
KETONES UR: NEGATIVE mg/dL
LEUKOCYTES UA: NEGATIVE
NITRITE: NEGATIVE
PH: 6 (ref 5.0–8.0)
PROTEIN: 30 mg/dL — AB
Specific Gravity, Urine: 1.034 — ABNORMAL HIGH (ref 1.005–1.030)

## 2015-12-17 LAB — COMPREHENSIVE METABOLIC PANEL
ALK PHOS: 302 U/L — AB (ref 38–126)
ALT: 33 U/L (ref 14–54)
ANION GAP: 18 — AB (ref 5–15)
AST: 34 U/L (ref 15–41)
Albumin: 2.9 g/dL — ABNORMAL LOW (ref 3.5–5.0)
BUN: 19 mg/dL (ref 6–20)
CALCIUM: 8.7 mg/dL — AB (ref 8.9–10.3)
CHLORIDE: 99 mmol/L — AB (ref 101–111)
CO2: 18 mmol/L — AB (ref 22–32)
Creatinine, Ser: 0.62 mg/dL (ref 0.44–1.00)
GFR calc non Af Amer: >60 mL/min (ref >60)
Glucose, Bld: 113 mg/dL — ABNORMAL HIGH (ref 65–99)
POTASSIUM: 3.4 mmol/L — AB (ref 3.5–5.1)
SODIUM: 135 mmol/L (ref 135–145)
Total Bilirubin: 0.8 mg/dL (ref 0.3–1.2)
Total Protein: 6.9 g/dL (ref 6.5–8.1)

## 2015-12-17 LAB — URINE MICROSCOPIC-ADD ON

## 2015-12-17 LAB — CBC WITH DIFFERENTIAL/PLATELET
BASOS ABS: 0 10*3/uL (ref 0.0–0.1)
Basophils Relative: 1 %
EOS ABS: 0 10*3/uL (ref 0.0–0.7)
EOS PCT: 1 %
HCT: 38.5 % (ref 36.0–46.0)
Hemoglobin: 12.5 g/dL (ref 12.0–15.0)
LYMPHS PCT: 24 %
Lymphs Abs: 0.9 10*3/uL (ref 0.7–4.0)
MCH: 23.5 pg — ABNORMAL LOW (ref 26.0–34.0)
MCHC: 32.5 g/dL (ref 30.0–36.0)
MCV: 72.4 fL — ABNORMAL LOW (ref 78.0–100.0)
Monocytes Absolute: 1.4 10*3/uL — ABNORMAL HIGH (ref 0.1–1.0)
Monocytes Relative: 36 %
Neutro Abs: 1.5 10*3/uL — ABNORMAL LOW (ref 1.7–7.7)
Neutrophils Relative %: 38 %
PLATELETS: 372 10*3/uL (ref 150–400)
RBC: 5.32 MIL/uL — AB (ref 3.87–5.11)
RDW: 16.4 % — ABNORMAL HIGH (ref 11.5–15.5)
WBC: 3.8 10*3/uL — AB (ref 4.0–10.5)

## 2015-12-17 LAB — I-STAT CG4 LACTIC ACID, ED
Lactic Acid, Venous: 3.02 mmol/L (ref 0.5–2.0)
Lactic Acid, Venous: 1.7 mmol/L (ref 0.5–2.0)

## 2015-12-17 LAB — CBG MONITORING, ED
GLUCOSE-CAPILLARY: 142 mg/dL — AB (ref 65–99)
Glucose-Capillary: 81 mg/dL (ref 65–99)

## 2015-12-17 LAB — GLUCOSE, CAPILLARY: GLUCOSE-CAPILLARY: 123 mg/dL — AB (ref 65–99)

## 2015-12-17 LAB — PROTIME-INR
INR: 1.79 — ABNORMAL HIGH (ref 0.00–1.49)
Prothrombin Time: 20.8 seconds — ABNORMAL HIGH (ref 11.6–15.2)

## 2015-12-17 LAB — MAGNESIUM: Magnesium: 1.6 mg/dL — ABNORMAL LOW (ref 1.7–2.4)

## 2015-12-17 LAB — LIPASE, BLOOD: LIPASE: 12 U/L (ref 11–51)

## 2015-12-17 LAB — APTT: APTT: 104 s — AB (ref 24–37)

## 2015-12-17 LAB — HEPARIN LEVEL (UNFRACTIONATED): Heparin Unfractionated: 0.95 IU/mL — ABNORMAL HIGH (ref 0.30–0.70)

## 2015-12-17 MED ORDER — HYDROMORPHONE HCL 1 MG/ML IJ SOLN
0.5000 mg | INTRAMUSCULAR | Status: DC | PRN
  Administered 2015-12-18 (×2): 1 mg via INTRAVENOUS
  Administered 2015-12-18: 0.5 mg via INTRAVENOUS
  Administered 2015-12-19 – 2015-12-24 (×16): 1 mg via INTRAVENOUS
  Filled 2015-12-17 (×19): qty 1

## 2015-12-17 MED ORDER — IOHEXOL 300 MG/ML  SOLN
50.0000 mL | Freq: Once | INTRAMUSCULAR | Status: DC | PRN
  Administered 2015-12-17: 50 mL via ORAL
  Filled 2015-12-17: qty 50

## 2015-12-17 MED ORDER — INSULIN ASPART 100 UNIT/ML ~~LOC~~ SOLN
0.0000 [IU] | SUBCUTANEOUS | Status: DC
  Administered 2015-12-18 – 2015-12-20 (×4): 1 [IU] via SUBCUTANEOUS
  Administered 2015-12-20: 2 [IU] via SUBCUTANEOUS
  Administered 2015-12-21 – 2015-12-22 (×4): 1 [IU] via SUBCUTANEOUS
  Administered 2015-12-23: 2 [IU] via SUBCUTANEOUS
  Administered 2015-12-24: 1 [IU] via SUBCUTANEOUS
  Administered 2015-12-24: 2 [IU] via SUBCUTANEOUS
  Administered 2015-12-24: 1 [IU] via SUBCUTANEOUS
  Filled 2015-12-17: qty 1

## 2015-12-17 MED ORDER — MORPHINE SULFATE (PF) 4 MG/ML IV SOLN
4.0000 mg | Freq: Once | INTRAVENOUS | Status: AC
  Administered 2015-12-17: 4 mg via INTRAVENOUS
  Filled 2015-12-17: qty 1

## 2015-12-17 MED ORDER — ONDANSETRON HCL 4 MG PO TABS: 4.0000 mg | ORAL_TABLET | Freq: Four times a day (QID) | ORAL | Status: DC | PRN

## 2015-12-17 MED ORDER — SODIUM CHLORIDE 0.9 % IV SOLN
INTRAVENOUS | Status: DC
  Administered 2015-12-17 – 2015-12-22 (×6): via INTRAVENOUS

## 2015-12-17 MED ORDER — HEPARIN BOLUS VIA INFUSION
3000.0000 [IU] | Freq: Once | INTRAVENOUS | Status: AC
  Administered 2015-12-17: 3000 [IU] via INTRAVENOUS
  Filled 2015-12-17: qty 3000

## 2015-12-17 MED ORDER — SODIUM CHLORIDE 0.9% FLUSH
3.0000 mL | Freq: Two times a day (BID) | INTRAVENOUS | Status: DC
  Administered 2015-12-18 – 2015-12-21 (×5): 3 mL via INTRAVENOUS

## 2015-12-17 MED ORDER — OXYCODONE HCL 5 MG PO TABS: 5.0000 mg | ORAL_TABLET | ORAL | Status: DC | PRN

## 2015-12-17 MED ORDER — SODIUM CHLORIDE 0.9 % IV BOLUS (SEPSIS)
1000.0000 mL | Freq: Once | INTRAVENOUS | Status: AC
Start: 2015-12-17 — End: 2015-12-17
  Administered 2015-12-17: 1000 mL via INTRAVENOUS

## 2015-12-17 MED ORDER — ALUM & MAG HYDROXIDE-SIMETH 200-200-20 MG/5ML PO SUSP
30.0000 mL | Freq: Four times a day (QID) | ORAL | Status: DC | PRN
  Administered 2015-12-19 – 2015-12-21 (×5): 30 mL via ORAL
  Filled 2015-12-17 (×6): qty 30

## 2015-12-17 MED ORDER — ACETAMINOPHEN 650 MG RE SUPP: 650.0000 mg | Freq: Four times a day (QID) | RECTAL | Status: DC | PRN

## 2015-12-17 MED ORDER — ACETAMINOPHEN 325 MG PO TABS: 650.0000 mg | ORAL_TABLET | Freq: Four times a day (QID) | ORAL | Status: DC | PRN

## 2015-12-17 MED ORDER — PANCRELIPASE (LIP-PROT-AMYL) 36000-114000 UNITS PO CPEP
36000.0000 [IU] | ORAL_CAPSULE | Freq: Three times a day (TID) | ORAL | Status: DC
  Administered 2015-12-18 – 2015-12-22 (×13): 36000 [IU] via ORAL
  Filled 2015-12-17 (×22): qty 1

## 2015-12-17 MED ORDER — PROMETHAZINE HCL 25 MG/ML IJ SOLN
12.5000 mg | Freq: Once | INTRAMUSCULAR | Status: AC
  Filled 2015-12-17: qty 1

## 2015-12-17 MED ORDER — HEPARIN (PORCINE) IN NACL 100-0.45 UNIT/ML-% IJ SOLN
850.0000 [IU]/h | INTRAMUSCULAR | Status: AC
  Administered 2015-12-17 – 2015-12-18 (×2): 850 [IU]/h via INTRAVENOUS
  Filled 2015-12-17 (×2): qty 250

## 2015-12-17 MED ORDER — IOHEXOL 300 MG/ML  SOLN
100.0000 mL | Freq: Once | INTRAMUSCULAR | Status: AC | PRN
  Administered 2015-12-17: 80 mL via INTRAVENOUS

## 2015-12-17 MED ORDER — LINACLOTIDE 145 MCG PO CAPS
145.0000 ug | ORAL_CAPSULE | Freq: Every day | ORAL | Status: DC | PRN
  Filled 2015-12-17: qty 1

## 2015-12-17 MED ORDER — METOPROLOL TARTRATE 1 MG/ML IV SOLN
2.5000 mg | Freq: Four times a day (QID) | INTRAVENOUS | Status: DC
  Administered 2015-12-17 – 2015-12-22 (×19): 2.5 mg via INTRAVENOUS
  Filled 2015-12-17 (×23): qty 5

## 2015-12-17 MED ORDER — METOCLOPRAMIDE HCL 5 MG/ML IJ SOLN
5.0000 mg | Freq: Four times a day (QID) | INTRAMUSCULAR | Status: AC
  Administered 2015-12-17 – 2015-12-19 (×6): 5 mg via INTRAVENOUS
  Filled 2015-12-17 (×6): qty 2

## 2015-12-17 MED ORDER — SODIUM CHLORIDE 0.9 % IV BOLUS (SEPSIS)
1000.0000 mL | Freq: Once | INTRAVENOUS | Status: AC
  Administered 2015-12-17: 1000 mL via INTRAVENOUS

## 2015-12-17 MED ORDER — ONDANSETRON HCL 4 MG/2ML IJ SOLN
4.0000 mg | Freq: Once | INTRAMUSCULAR | Status: AC
  Administered 2015-12-17: 4 mg via INTRAVENOUS
  Filled 2015-12-17: qty 2

## 2015-12-17 MED ORDER — LORAZEPAM 0.5 MG PO TABS
0.5000 mg | ORAL_TABLET | Freq: Four times a day (QID) | ORAL | Status: DC | PRN
  Administered 2015-12-19: 0.5 mg via ORAL
  Filled 2015-12-17: qty 1

## 2015-12-17 MED ORDER — ATENOLOL 50 MG PO TABS
50.0000 mg | ORAL_TABLET | Freq: Every day | ORAL | Status: DC
  Administered 2015-12-18 – 2015-12-24 (×6): 50 mg via ORAL
  Filled 2015-12-17 (×7): qty 1

## 2015-12-17 MED ORDER — ONDANSETRON HCL 4 MG/2ML IJ SOLN
4.0000 mg | Freq: Four times a day (QID) | INTRAMUSCULAR | Status: DC | PRN
  Administered 2015-12-19 – 2015-12-24 (×7): 4 mg via INTRAVENOUS
  Filled 2015-12-17 (×7): qty 2

## 2015-12-17 NOTE — H&P (Signed)
[facility] Admission History and Physical       Jacqueline Hahn [id number] DOB: 06-15-1946 DOA: 12/17/2015  Referring physician: EDP PCP: Annye Asa, MD  Specialists:   Chief Complaint: ABD Pain, Nausea and Vomiting  HPI: Jacqueline Hahn is a 70 y.o. female with advanced Pancreatic Cancer (diagnosed 07/2014) receiving Chemo Rx who presents to the ED with complaints of Increased ABD pain , Nausea and Vomiting x 2 weeks.   She reports that she has not been able to hold down any food or liquids for the past 2 days.    She denies fevers and chills and diarrhea.  She does report constipation.   He ABD pain is all over, and is currently a 7/10.    A ct Scan was performed in the ED, and revealed progression of disease, and a LLE DVT.  She was started on an IV Heparin drip and referred for admission.      Review of Systems:    Constitutional: No Weight Loss, No Weight Gain, Night Sweats, Fevers, Chills, Dizziness, Light Headedness, Fatigue, or Generalized Weakness HEENT: No Headaches, Difficulty Swallowing,Tooth/Dental Problems,Sore Throat,  No Sneezing, Rhinitis, Ear Ache, Nasal Congestion, or Post Nasal Drip,  Cardio-vascular:  No Chest pain, Orthopnea, PND, Edema in Lower Extremities, Anasarca, Dizziness, Palpitations  Resp: No Dyspnea, No DOE, No Productive Cough, No Non-Productive Cough, No Hemoptysis, No Wheezing.    GI: No Heartburn, Indigestion, +Abdominal Pain, Nausea, Vomiting, Diarrhea, +Constipation, Hematemesis, Hematochezia, Melena, Change in Bowel Habits,  Loss of Appetite  GU: No Dysuria, No Change in Color of Urine, No Urgency or Urinary Frequency, No Flank pain.  Musculoskeletal: No Joint Pain or Swelling, No Decreased Range of Motion, No Back Pain.  Neurologic: No Syncope, No Seizures, Muscle Weakness, Paresthesia, Vision Disturbance or Loss, No Diplopia, No Vertigo, No Difficulty Walking,  Skin: No Rash or Lesions. Psych: No Change in Mood or Affect, No Depression or  Anxiety, No Memory loss, No Confusion, or Hallucinations   Past Medical History  Diagnosis Date  . Hypertension   . UTI (urinary tract infection)   . History of stomach ulcers   . Diabetes mellitus without complication ([place])   . Complication of anesthesia     hx of n/v  . PONV (postoperative nausea and vomiting)   . Pancreatic cancer metastasized to lung [facility]) 07/10/2014     Past Surgical History  Procedure Laterality Date  . Dental surgery    . Bunionectomy    . Ercp N/A 06/23/2014    Procedure: ENDOSCOPIC RETROGRADE CHOLANGIOPANCREATOGRAPHY (ERCP);  Surgeon: Ladene Artist, MD;  Location: [facility] ENDOSCOPY;  Service: Endoscopy;  Laterality: N/A;  . Eus N/A 06/29/2014    Procedure: UPPER ENDOSCOPIC ULTRASOUND (EUS) LINEAR;  Surgeon: Beryle Beams, MD;  Location: [facility] ENDOSCOPY;  Service: Endoscopy;  Laterality: N/A;  . Fine needle aspiration N/A 06/29/2014    Procedure: FINE NEEDLE ASPIRATION (FNA) LINEAR;  Surgeon: Beryle Beams, MD;  Location: [facility] ENDOSCOPY;  Service: Endoscopy;  Laterality: N/A;  . Ercp N/A 04/21/2015    Procedure: ENDOSCOPIC RETROGRADE CHOLANGIOPANCREATOGRAPHY (ERCP) with stent removal and stent replacement;  Surgeon: Milus Banister, MD;  Location: [facility] ORS;  Service: Endoscopy;  Laterality: N/A;      Prior to Admission medications   Medication Sig Start Date End Date Taking? Authorizing Provider  amLODipine (NORVASC) 10 MG tablet Take 1 tablet (10 mg total) by mouth daily. 90 day supply 11/20/15  Yes [name], MD  atenolol (TENORMIN) 50 MG tablet  Take 1 tablet (50 mg total) by mouth daily. 12/04/15  Yes [name], MD  cyproheptadine (PERIACTIN) 4 MG tablet Take 0.5 tablets (2 mg total) by mouth 3 (three) times daily as needed for allergies. 10/30/15  Yes Eliezer Bottom, NP  dexamethasone (DECADRON) 4 MG tablet Take 2 tablets (8 mg total) by mouth 2 (two) times daily with a meal. Start the day after chemotherapy for 3 days. Take with food. 12/04/15  Yes [name], MD  glipiZIDE-metformin (METAGLIP) 5-500 MG tablet Take 1 tablet by mouth 2 (two) times daily before a meal. 90 day supply. 11/20/15  Yes [name], MD  l-methylfolate-B6-B12 (METANX) 3-35-2 MG TABS Take 1 tablet by mouth daily. 04/22/15  Yes [name], MD  lidocaine-prilocaine (EMLA) cream Apply 1 application topically as needed. Apply quarter sized amount to portacath site at least one hour prior to chemo treatment 07/10/14  Yes [name], MD  LINZESS 145 MCG CAPS capsule Take 145 mcg by mouth daily as needed (constipation). Reported on 10/30/2015 08/15/15  Yes Historical Provider, MD  lipase/protease/amylase (CREON) 36000 UNITS CPEP capsule Take 1 capsule (36,000 Units total) by mouth 3 (three) times daily with meals. 11/14/15  Yes Eliezer Bottom, NP  megestrol (MEGACE) 400 MG/10ML suspension Take 800 mg by mouth daily as needed (only uses when stomach is upset or feels stomach "needs" it).    Yes Historical Provider, MD  Multiple Vitamin (MULTIVITAMIN WITH MINERALS) TABS tablet Take 1 tablet by mouth daily. Reported on 10/30/2015   Yes Historical Provider, MD  ondansetron (ZOFRAN) 8 MG tablet Take 1 tablet (8 mg total) by mouth 2 (two) times daily. Start the day after chemo for 3 days. Then take as needed for nausea or vomiting. 12/05/15  Yes [name], MD  potassium chloride SA (K-DUR,KLOR-CON) 20 MEQ tablet Take 2 tablets (40 mEq total) by mouth 2 (two) times daily. 90 day supply 11/20/15  Yes [name], MD  prochlorperazine (COMPAZINE) 10 MG tablet Take 1 tablet (10 mg total) by mouth every 6 (six) hours as needed (Nausea or vomiting). 12/05/15  Yes [name], MD  traMADol (ULTRAM) 50 MG tablet Take 1-2 tablets (50-100 mg total) by mouth every 8 (eight) hours as needed for moderate pain. 10/15/15  Yes [name], MD  amoxicillin-clavulanate (AUGMENTIN) 875-125 MG tablet Take 1 tablet by mouth 2 (two) times daily. Patient not taking: Reported on  12/17/2015 12/02/15   Eliezer Bottom, NP  lactulose (CHRONULAC) 10 GM/15ML solution Take 30 mLs (20 g total) by mouth 3 (three) times daily. Patient not taking: Reported on 12/17/2015 07/04/15   [name], MD  loperamide (IMODIUM A-D) 2 MG tablet Take 2 at onset of diarrhea, then 1 every 2hrs until 12hr without a BM. May take 2 tab every 4hrs at bedtime. If diarrhea recurs repeat. Patient not taking: Reported on 12/17/2015 12/04/15   [name], MD  LORazepam (ATIVAN) 0.5 MG tablet Take 1 tablet (0.5 mg total) by mouth every 6 (six) hours as needed for anxiety. Patient not taking: Reported on 12/17/2015 12/04/15   [name], MD  metoCLOPramide (REGLAN) 10 MG tablet Take 1 tablet (10 mg total) by mouth 4 (four) times daily. Patient not taking: Reported on 12/17/2015 12/10/15   [name], MD  naloxegol oxalate (MOVANTIK) 25 MG TABS tablet Take 1 tablet (25 mg total) by mouth daily. Patient not taking: Reported on 12/17/2015 07/01/15  [name], MD     No Known Allergies  Social History:  reports that she quit smoking about 48 years ago. Her smoking use included Cigarettes. She started smoking about 52 years ago. She has a 4 pack-year smoking history. She has never used smokeless tobacco. She reports that she does not drink alcohol or use illicit drugs.    Family History  Problem Relation Age of Onset  . Heart attack Mother   . Stroke Mother   . Heart attack Father   . Hyperlipidemia Sister   . Hypertension Sister   . Hypertension Brother   . Alcohol abuse Brother        Physical Exam:  GEN:  Pleasant Thin ill Appearing  70 y.o. African American female examined and in no acute distress; cooperative with exam Filed Vitals:   12/17/15 1300 12/17/15 1330 12/17/15 1430 12/17/15 1520  BP: 176/89   158/86  Pulse:  131 132 128  Temp:      TempSrc:      Resp: 24 21 22 17   SpO2:  100% 99% 100%   Blood pressure 158/86, pulse 128, temperature 97.7 F (36.5 C),  temperature source Oral, resp. rate 17, SpO2 100 %. PSYCH: SHe is alert and oriented x4; does not appear anxious does not appear depressed; affect is normal HEENT: Normocephalic and Atraumatic, Mucous membranes pink; PERRLA; EOM intact; Fundi:  Benign;  No scleral icterus, Nares: Patent, Oropharynx: Clear, Fair Dentition,    Neck:  FROM, No Cervical Lymphadenopathy nor Thyromegaly or Carotid Bruit; No JVD; Breasts:: Not examined CHEST WALL: No tenderness CHEST: Normal respiration, clear to auscultation bilaterally HEART: Regular rate and rhythm; no murmurs rubs or gallops BACK: No kyphosis or scoliosis; No CVA tenderness ABDOMEN: Positive Bowel Sounds, Distended, Soft Diffusely Tender, No Rebound or Guarding; No Masses, No Organomegaly. Rectal Exam: Not done EXTREMITIES: No Cyanosis, Clubbing, or Edema; No Ulcerations. Genitalia: not examined PULSES: 2+ and symmetric SKIN: Normal hydration no rash or ulceration CNS:  Alert and Oriented x 4, No Focal Deficits Vascular: pulses palpable throughout    Labs on Admission:  Basic Metabolic Panel:  Recent Labs Lab 12/17/15 1441  NA 135  K 3.4*  CL 99*  CO2 18*  GLUCOSE 113*  BUN 19  CREATININE 0.62  CALCIUM 8.7*  MG 1.6*   Liver Function Tests:  Recent Labs Lab 12/17/15 1441  AST 34  ALT 33  ALKPHOS 302*  BILITOT 0.8  PROT 6.9  ALBUMIN 2.9*    Recent Labs Lab 12/17/15 1441  LIPASE 12   No results for input(s): AMMONIA in the last 168 hours. CBC:  Recent Labs Lab 12/17/15 1441  WBC 3.8*  NEUTROABS 1.5*  HGB 12.5  HCT 38.5  MCV 72.4*  PLT 372   Cardiac Enzymes: No results for input(s): CKTOTAL, CKMB, CKMBINDEX, TROPONINI in the last 168 hours.  BNP (last 3 results) No results for input(s): BNP in the last 8760 hours.  ProBNP (last 3 results) No results for input(s): PROBNP in the last 8760 hours.  CBG: No results for input(s): GLUCAP in the last 168 hours.  Radiological Exams on Admission: Ct  Abdomen Pelvis W Contrast  12/17/2015  CLINICAL DATA:  Pancreatic cancer, unable to keep solids and fluids down, irregular heart rate, fever subjectively, hypertension, diabetes mellitus, former smoker EXAM: CT ABDOMEN AND PELVIS WITH CONTRAST TECHNIQUE: Multidetector CT imaging of the abdomen and pelvis was performed using the standard protocol following bolus administration of intravenous contrast. Sagittal and  coronal MPR images reconstructed from axial data set. CONTRAST:  61mL OMNIPAQUE IOHEXOL 300 MG/ML SOLN IV. Dilute oral contrast. COMPARISON:  None. FINDINGS: Two questionable tiny nodules at LEFT lower lobe. Biliary air consistent with CBD wall stent which is present. No intrahepatic biliary dilatation. Small subcapsular fluid collection at the RIGHT lobe of the liver, 3.4 x 1.9 cm image 19. Remainder of liver, spleen, kidneys, and adrenal glands normal appearance. Soft tissue nodularity adjacent to the gallbladder with thickening of the wall of the hepatic flexure compatible with tumor. Scattered mild infiltrative changes of the greater omentum compatible with tumor. Significant ascites throughout abdomen and pelvis. Peritoneal thickening in the lateral LEFT pelvis consistent with peritoneal carcinomatosis. Unremarkable bladder and ovaries. Small nodule at uterus 13 mm diameter question small leiomyoma. Atrophic pancreas with ductal dilatation at the body and tail. Large and small bowel loops normal. Questionable gastric antral and pyloric wall thickening. Thrombus within LEFT common femoral vein consistent with deep venous thrombosis. Remaining vascular structures appear grossly patent. No free intraperitoneal air or evidence of bowel obstruction. Osseous structures unremarkable. IMPRESSION: Significant ascites with omental infiltration as well as probable tumor adjacent to the hepatic flexure and gallbladder consistent with peritoneal carcinomatosis. Filling defect in LEFT common femoral vein consistent  with deep venous thrombosis. Cannot exclude wall thickening at the gastric antrum/pylorus; this could be related to ulcer disease/gastritis, radiation therapy, or tumor. Two questionable tiny LEFT lower lobe pulmonary nodules. Findings called to Quincy Carnes in ED on 12/17/2015 at 1620 hours. Electronically Signed   By: Lavonia Dana M.D.   On: 12/17/2015 16:20   Dg Abd Acute W/chest  12/17/2015  CLINICAL DATA:  Back pain and right side pain since chemotherapy 2 weeks ago. Vomiting. EXAM: DG ABDOMEN ACUTE W/ 1V CHEST COMPARISON:  Chest CT 11/06/2015 FINDINGS: Right Port-A-Cath is in place with the tip in the right atrium. Linear densities at the left base, likely scarring or atelectasis. No effusions. Heart is normal size. Nonobstructive bowel gas pattern. Moderate stool in the colon. Metallic biliary stent in place. No organomegaly or free air. IMPRESSION: Moderate stool burden. No evidence of bowel obstruction or free air. Left basilar atelectasis or scarring.  No active disease. Electronically Signed   By: Rolm Baptise M.D.   On: 12/17/2015 13:22       Assessment/Plan:   70 y.o. female with  Principal Problem:   1.     DVT (deep venous thrombosis), left   IV Heparin drip   Active Problems:   2.     Pancreatic cancer metastasized to lung [facility])   Oncologist is Dr Marin Olp    Consulted by the EDP     3.     AKI (acute kidney injury) ([place])   IVFs     4.     Nausea and vomiting   PRN IV Zofran   IV Reglan     5.     ABD Pain   PRN IV Dilaudid     6.     Ascites   IR Evaluation for Paracentesis in AM     7.     Constipation   Laxative Rx      8.     Diabetes mellitus without complication ([facility])   SSI coverage PRN     9.     Hypokalemia   Replace KCl IV   Check Magnesium     10.   DVT Prophylaxis   On IV Heparin drip  Code Status:     FULL CODE      Family Communication:    No Family Present    Disposition Plan:    Inpatient  Status        Time spent:  [facility] Pager [phone]   If [place] Please Contact the Day Rounding Team MD for [facility]  If 7PM-7AM, Please Contact Night-Floor Coverage  [email or web address] Password [email or web address] 12/17/2015, 5:08 PM     ADDENDUM:   Patient was seen and examined on 12/17/2015

## 2015-12-17 NOTE — ED Notes (Signed)
Delay in lab draw,  Pt not in room 

## 2015-12-17 NOTE — Progress Notes (Signed)
ANTICOAGULATION CONSULT NOTE - Initial Consult  Pharmacy Consult for Heparin Indication: DVT  No Known Allergies  Patient Measurements:    Height = 61 inches Weight = 50.8 kg (12/04/15) Heparin Dosing Weight: actual body weight  Vital Signs: Temp: 97.7 F (36.5 C) (02/14 1145) Temp Source: Oral (02/14 1145) BP: 158/86 mmHg (02/14 1520) Pulse Rate: 128 (02/14 1520)  Labs:  Recent Labs  12/17/15 1441  HGB 12.5  HCT 38.5  PLT 372  CREATININE 0.62    Estimated Creatinine Clearance: 50.8 mL/min (by C-G formula based on Cr of 0.62).   Medical History: Past Medical History  Diagnosis Date  . Hypertension   . UTI (urinary tract infection)   . History of stomach ulcers   . Diabetes mellitus without complication ([place])   . Complication of anesthesia     hx of n/v  . PONV (postoperative nausea and vomiting)   . Pancreatic cancer metastasized to lung ([place]) 07/10/2014    Medications:  Scheduled:  . heparin  3,000 Units Intravenous Once   Infusions:  . heparin    . sodium chloride      Assessment:  70 yr female with pancreatic cancer, currently undergoing chemotherapy presents with abdominal pain, N/V, dysuria and constipation.  Recently completed 3 day course of antibiotics for UTI  CT Angio revealed + left common femoral vein DVT, as well as ascites consistent with peritoneal carcinomatosis and 2 questionable left lower lobe pulmonary nodules.  Pharmacy consulted to dose IV heparin for +DVT  Patient on no oral anticoagulation PTA  Goal of Therapy:  Heparin level 0.3-0.7 units/ml Monitor platelets by anticoagulation protocol: Yes   Plan:   Obtain baseline aPTT & PT/INR  Heparin 3000 unit IV bolus x 1 followed by infusion @ 850 units/hr  Check heparin level 6 hr after heparin started  Follow heparin level & CBC daily  Melanie Openshaw, Toribio Harbour, PharmD 12/17/2015,4:45 PM

## 2015-12-17 NOTE — ED Notes (Signed)
Accessed pt's chest port yet unable to obtain blood sample, flushed 20 cc NS without difficulty . Infusion in route .

## 2015-12-17 NOTE — ED Notes (Signed)
Attempted to collect blood from chest port access , yet no success. Pt sts that she has difficulties to obtain blood from port in past several times.

## 2015-12-17 NOTE — ED Provider Notes (Signed)
Patient received in sign out from [place] at shift change.  Please see her note for full H&P.  Briefly, 70 y.o. F with hx of pancreatic cancer, started new chemo (FOLFIRI) 2 weeks ago.  Reports right sided abdominal pain with nausea and vomiting for 2 weeks.  Reports no BM for a week, no fecal impaction.  Some dysuria and straining to urinate, urine dark in color.    Oncologist is Dr. Marin Olp.  Patient's labs with elevated lactate, normal white count. She does have increased anion gap which is likely secondary to dehydration from her nausea and vomiting. Patient has remained afebrile here in the ED.  Plan:  Waiting for CT scan and U/A.  Likely admission.  Results for orders placed or performed during the hospital encounter of 12/17/15  Comprehensive metabolic panel  Result Value Ref Range   Sodium 135 135 - 145 mmol/L   Potassium 3.4 (L) 3.5 - 5.1 mmol/L   Chloride 99 (L) 101 - 111 mmol/L   CO2 18 (L) 22 - 32 mmol/L   Glucose, Bld 113 (H) 65 - 99 mg/dL   BUN 19 6 - 20 mg/dL   Creatinine, Ser 0.62 0.44 - 1.00 mg/dL   Calcium 8.7 (L) 8.9 - 10.3 mg/dL   Total Protein 6.9 6.5 - 8.1 g/dL   Albumin 2.9 (L) 3.5 - 5.0 g/dL   AST 34 15 - 41 U/L   ALT 33 14 - 54 U/L   Alkaline Phosphatase 302 (H) 38 - 126 U/L   Total Bilirubin 0.8 0.3 - 1.2 mg/dL   GFR calc non Af Amer >60 >60 mL/min   GFR calc Af Amer >60 >60 mL/min   Anion gap 18 (H) 5 - 15  Urinalysis, Routine w reflex microscopic (not at [facility])  Result Value Ref Range   Color, Urine AMBER (A) YELLOW   APPearance CLOUDY (A) CLEAR   Specific Gravity, Urine 1.034 (H) 1.005 - 1.030   pH 6.0 5.0 - 8.0   Glucose, UA NEGATIVE NEGATIVE mg/dL   Hgb urine dipstick NEGATIVE NEGATIVE   Bilirubin Urine MODERATE (A) NEGATIVE   Ketones, ur NEGATIVE NEGATIVE mg/dL   Protein, ur 30 (A) NEGATIVE mg/dL   Nitrite NEGATIVE NEGATIVE   Leukocytes, UA NEGATIVE NEGATIVE  CBC with Differential  Result Value Ref Range   WBC 3.8 (L) 4.0 - 10.5 K/uL   RBC 5.32  (H) 3.87 - 5.11 MIL/uL   Hemoglobin 12.5 12.0 - 15.0 g/dL   HCT 38.5 36.0 - 46.0 %   MCV 72.4 (L) 78.0 - 100.0 fL   MCH 23.5 (L) 26.0 - 34.0 pg   MCHC 32.5 30.0 - 36.0 g/dL   RDW 16.4 (H) 11.5 - 15.5 %   Platelets 372 150 - 400 K/uL   Neutrophils Relative % 38 %   Neutro Abs 1.5 (L) 1.7 - 7.7 K/uL   Lymphocytes Relative 24 %   Lymphs Abs 0.9 0.7 - 4.0 K/uL   Monocytes Relative 36 %   Monocytes Absolute 1.4 (H) 0.1 - 1.0 K/uL   Eosinophils Relative 1 %   Eosinophils Absolute 0.0 0.0 - 0.7 K/uL   Basophils Relative 1 %   Basophils Absolute 0.0 0.0 - 0.1 K/uL  Lipase, blood  Result Value Ref Range   Lipase 12 11 - 51 U/L  Urine microscopic-add on  Result Value Ref Range   Squamous Epithelial / LPF 0-5 (A) NONE SEEN   WBC, UA 0-5 0 - 5 WBC/hpf   RBC /  HPF 0-5 0 - 5 RBC/hpf   Bacteria, UA RARE (A) NONE SEEN   Casts HYALINE CASTS (A) NEGATIVE   Crystals CA OXALATE CRYSTALS (A) NEGATIVE   Urine-Other MUCOUS PRESENT   Magnesium  Result Value Ref Range   Magnesium 1.6 (L) 1.7 - 2.4 mg/dL  I-Stat CG4 Lactic Acid, ED  (not at [facility])  Result Value Ref Range   Lactic Acid, Venous 3.02 (HH) 0.5 - 2.0 mmol/L   Comment NOTIFIED PHYSICIAN    Ct Abdomen Pelvis W Contrast  12/17/2015  CLINICAL DATA:  Pancreatic cancer, unable to keep solids and fluids down, irregular heart rate, fever subjectively, hypertension, diabetes mellitus, former smoker EXAM: CT ABDOMEN AND PELVIS WITH CONTRAST TECHNIQUE: Multidetector CT imaging of the abdomen and pelvis was performed using the standard protocol following bolus administration of intravenous contrast. Sagittal and coronal MPR images reconstructed from axial data set. CONTRAST:  43mL OMNIPAQUE IOHEXOL 300 MG/ML SOLN IV. Dilute oral contrast. COMPARISON:  None. FINDINGS: Two questionable tiny nodules at LEFT lower lobe. Biliary air consistent with CBD wall stent which is present. No intrahepatic biliary dilatation. Small subcapsular fluid collection at the  RIGHT lobe of the liver, 3.4 x 1.9 cm image 19. Remainder of liver, spleen, kidneys, and adrenal glands normal appearance. Soft tissue nodularity adjacent to the gallbladder with thickening of the wall of the hepatic flexure compatible with tumor. Scattered mild infiltrative changes of the greater omentum compatible with tumor. Significant ascites throughout abdomen and pelvis. Peritoneal thickening in the lateral LEFT pelvis consistent with peritoneal carcinomatosis. Unremarkable bladder and ovaries. Small nodule at uterus 13 mm diameter question small leiomyoma. Atrophic pancreas with ductal dilatation at the body and tail. Large and small bowel loops normal. Questionable gastric antral and pyloric wall thickening. Thrombus within LEFT common femoral vein consistent with deep venous thrombosis. Remaining vascular structures appear grossly patent. No free intraperitoneal air or evidence of bowel obstruction. Osseous structures unremarkable. IMPRESSION: Significant ascites with omental infiltration as well as probable tumor adjacent to the hepatic flexure and gallbladder consistent with peritoneal carcinomatosis. Filling defect in LEFT common femoral vein consistent with deep venous thrombosis. Cannot exclude wall thickening at the gastric antrum/pylorus; this could be related to ulcer disease/gastritis, radiation therapy, or tumor. Two questionable tiny LEFT lower lobe pulmonary nodules. Findings called to Quincy Carnes in ED on 12/17/2015 at 1620 hours. Electronically Signed   By: Lavonia Dana M.D.   On: 12/17/2015 16:20   Dg Abd Acute W/chest  12/17/2015  CLINICAL DATA:  Back pain and right side pain since chemotherapy 2 weeks ago. Vomiting. EXAM: DG ABDOMEN ACUTE W/ 1V CHEST COMPARISON:  Chest CT 11/06/2015 FINDINGS: Right Port-A-Cath is in place with the tip in the right atrium. Linear densities at the left base, likely scarring or atelectasis. No effusions. Heart is normal size. Nonobstructive bowel gas  pattern. Moderate stool in the colon. Metallic biliary stent in place. No organomegaly or free air. IMPRESSION: Moderate stool burden. No evidence of bowel obstruction or free air. Left basilar atelectasis or scarring.  No active disease. Electronically Signed   By: Rolm Baptise M.D.   On: 12/17/2015 13:22    CT scan with evidence of ascites and peritoneal carcinomatosis. She also has evidence of a DVT in the left common femoral vein.    4:36 PM Results were discussed with patient. On repeat exam she states her nausea is better, she still feels generally weak.  She has not attemtped to eat or drink yet. She remains  tachycardic and the 120s, she appears clinically dry.  Feel patient will benefit from admission for continued hydration.  Patient denies any chest pain or shortness of breath currently. Her DVT is likely secondary to her hypocoagulable state. Patient without known hx of GI bleeding.  Will start on heparin given new DVT.  4:55 PM Patient admitted to medicine service, Dr. Arnoldo Morale.  Oncology service was notified of admission, will be by to see patient in the morning.  Larene Pickett, PA-C 12/17/15 1858  Gareth Morgan, MD 12/18/15 [id number]

## 2015-12-17 NOTE — ED Notes (Signed)
Patient alert, oriented, and in no acute distress

## 2015-12-17 NOTE — ED Notes (Signed)
Bed: WA02 Expected date:  Expected time:  Means of arrival:  Comments: Hold for triage 

## 2015-12-17 NOTE — ED Notes (Signed)
MD paged about patient's heart rate. Waiting on response.

## 2015-12-17 NOTE — Telephone Encounter (Signed)
Patient c/o being unable to keep solids and fluids down. She felt like she had an irregular heart rate so she took her BP and Pulse. BP 159/107 P 144. She also feels like she has a fever - but unable to check - and her back hurts on her right side.   Reviewed all symptoms with Dr Marin Olp and he would like patient  to go to the [facility] ED for assessment due to cardiac symptoms and possibility of sepsis.  Patient aware. She will go to the ED at [facility].

## 2015-12-17 NOTE — ED Notes (Signed)
Had chemo two weeks ago, since then has had back pain and right sided pain. Says this week she's been vomiting whenever she eats, unable to keep down food or fluid. Denies CP and SOB. Tachycardic at 141 in triage, no fever.

## 2015-12-17 NOTE — ED Provider Notes (Signed)
CSN: [id number]     Arrival date & time 12/17/15  1133 History   First MD Initiated Contact with Patient 12/17/15 1202     Chief Complaint  Patient presents with  . Emesis  . Chemo patient   . Tachycardia     (Consider location/radiation/quality/duration/timing/severity/associated sxs/prior Treatment) HPI   Patient with hx adenocarcinoma of pancreas, recently started on new chemotherapy regimen FOLFIRI 2 weeks ago (plan for second dose tomorrow), presents today with two weeks of right sided abdominal pain with uncontrolled N/V, dysuria, dark urine, and constipation.  The abdominal pain is right sided, worse with lying on that side, gradually worsening over 2 weeks.  Vomiting has also been ongoing x 2 weeks.  Last BM was last week.  States she has to push hard to pass urine, the stream is small and the urine is dark, she has burning with urination.  Recently took a 3 day course of antibiotics for UTI.    Pt has previously undergone chemotherapy for same, per Dr Antonieta Pert note, had concerning recent weight loss and increase in CA19-9.   Past Medical History  Diagnosis Date  . Hypertension   . UTI (urinary tract infection)   . History of stomach ulcers   . Diabetes mellitus without complication ([place])   . Complication of anesthesia     hx of n/v  . PONV (postoperative nausea and vomiting)   . Pancreatic cancer metastasized to lung [facility]) 07/10/2014   Past Surgical History  Procedure Laterality Date  . Dental surgery    . Bunionectomy    . Ercp N/A 06/23/2014    Procedure: ENDOSCOPIC RETROGRADE CHOLANGIOPANCREATOGRAPHY (ERCP);  Surgeon: Ladene Artist, MD;  Location: [facility] ENDOSCOPY;  Service: Endoscopy;  Laterality: N/A;  . Eus N/A 06/29/2014    Procedure: UPPER ENDOSCOPIC ULTRASOUND (EUS) LINEAR;  Surgeon: Beryle Beams, MD;  Location: [facility] ENDOSCOPY;  Service: Endoscopy;  Laterality: N/A;  . Fine needle aspiration N/A 06/29/2014    Procedure: FINE NEEDLE ASPIRATION (FNA) LINEAR;  Surgeon:  Beryle Beams, MD;  Location: [facility] ENDOSCOPY;  Service: Endoscopy;  Laterality: N/A;  . Ercp N/A 04/21/2015    Procedure: ENDOSCOPIC RETROGRADE CHOLANGIOPANCREATOGRAPHY (ERCP) with stent removal and stent replacement;  Surgeon: Milus Banister, MD;  Location: [facility] ORS;  Service: Endoscopy;  Laterality: N/A;   Family History  Problem Relation Age of Onset  . Heart attack Mother   . Stroke Mother   . Heart attack Father   . Hyperlipidemia Sister   . Hypertension Sister   . Hypertension Brother   . Alcohol abuse Brother    Social History  Substance Use Topics  . Smoking status: Former Smoker -- 1.00 packs/day for 4 years    Types: Cigarettes    Start date: 02/03/1963    Quit date: 03/05/1967  . Smokeless tobacco: Never Used     Comment: quit 45 years ago  . Alcohol Use: No   OB History    No data available     Review of Systems  All other systems reviewed and are negative.     Allergies  Review of patient's allergies indicates no known allergies.  Home Medications   Prior to Admission medications   Medication Sig Start Date End Date Taking? Authorizing Provider  amLODipine (NORVASC) 10 MG tablet Take 1 tablet (10 mg total) by mouth daily. 90 day supply 11/20/15  Yes [name], MD  atenolol (TENORMIN) 50 MG tablet Take 1 tablet (50 mg total) by mouth daily.  12/04/15  Yes [name], MD  cyproheptadine (PERIACTIN) 4 MG tablet Take 0.5 tablets (2 mg total) by mouth 3 (three) times daily as needed for allergies. 10/30/15  Yes Eliezer Bottom, NP  dexamethasone (DECADRON) 4 MG tablet Take 2 tablets (8 mg total) by mouth 2 (two) times daily with a meal. Start the day after chemotherapy for 3 days. Take with food. 12/04/15  Yes [name], MD  glipiZIDE-metformin (METAGLIP) 5-500 MG tablet Take 1 tablet by mouth 2 (two) times daily before a meal. 90 day supply. 11/20/15  Yes [name], MD  l-methylfolate-B6-B12 (METANX) 3-35-2 MG TABS Take 1 tablet by mouth daily.  04/22/15  Yes [name], MD  lidocaine-prilocaine (EMLA) cream Apply 1 application topically as needed. Apply quarter sized amount to portacath site at least one hour prior to chemo treatment 07/10/14  Yes [name], MD  LINZESS 145 MCG CAPS capsule Take 145 mcg by mouth daily as needed (constipation). Reported on 10/30/2015 08/15/15  Yes Historical Provider, MD  lipase/protease/amylase (CREON) 36000 UNITS CPEP capsule Take 1 capsule (36,000 Units total) by mouth 3 (three) times daily with meals. 11/14/15  Yes Eliezer Bottom, NP  megestrol (MEGACE) 400 MG/10ML suspension Take 800 mg by mouth daily as needed (only uses when stomach is upset or feels stomach "needs" it).    Yes Historical Provider, MD  Multiple Vitamin (MULTIVITAMIN WITH MINERALS) TABS tablet Take 1 tablet by mouth daily. Reported on 10/30/2015   Yes Historical Provider, MD  ondansetron (ZOFRAN) 8 MG tablet Take 1 tablet (8 mg total) by mouth 2 (two) times daily. Start the day after chemo for 3 days. Then take as needed for nausea or vomiting. 12/05/15  Yes [name], MD  potassium chloride SA (K-DUR,KLOR-CON) 20 MEQ tablet Take 2 tablets (40 mEq total) by mouth 2 (two) times daily. 90 day supply 11/20/15  Yes [name], MD  prochlorperazine (COMPAZINE) 10 MG tablet Take 1 tablet (10 mg total) by mouth every 6 (six) hours as needed (Nausea or vomiting). 12/05/15  Yes [name], MD  traMADol (ULTRAM) 50 MG tablet Take 1-2 tablets (50-100 mg total) by mouth every 8 (eight) hours as needed for moderate pain. 10/15/15  Yes [name], MD  amoxicillin-clavulanate (AUGMENTIN) 875-125 MG tablet Take 1 tablet by mouth 2 (two) times daily. Patient not taking: Reported on 12/17/2015 12/02/15   Eliezer Bottom, NP  lactulose (CHRONULAC) 10 GM/15ML solution Take 30 mLs (20 g total) by mouth 3 (three) times daily. Patient not taking: Reported on 12/17/2015 07/04/15   [name], MD  loperamide (IMODIUM A-D) 2 MG  tablet Take 2 at onset of diarrhea, then 1 every 2hrs until 12hr without a BM. May take 2 tab every 4hrs at bedtime. If diarrhea recurs repeat. Patient not taking: Reported on 12/17/2015 12/04/15   [name], MD  LORazepam (ATIVAN) 0.5 MG tablet Take 1 tablet (0.5 mg total) by mouth every 6 (six) hours as needed for anxiety. Patient not taking: Reported on 12/17/2015 12/04/15   [name], MD  metoCLOPramide (REGLAN) 10 MG tablet Take 1 tablet (10 mg total) by mouth 4 (four) times daily. Patient not taking: Reported on 12/17/2015 12/10/15   [name], MD  naloxegol oxalate (MOVANTIK) 25 MG TABS tablet Take 1 tablet (25 mg total) by mouth daily. Patient not taking: Reported on 12/17/2015 07/01/15   [name], MD   BP 149/93  mmHg  Pulse 143  Temp(Src) 97.7 F (36.5 C) (Oral)  Resp 18  SpO2 100% Physical Exam  Constitutional: She appears well-developed and well-nourished. No distress.  HENT:  Head: Normocephalic and atraumatic.  Eyes: Conjunctivae are normal.  Neck: Normal range of motion. Neck supple.  Cardiovascular: Regular rhythm.  Tachycardia present.   Pulmonary/Chest: Effort normal and breath sounds normal. No respiratory distress. She has no wheezes. She has no rales.  Abdominal: Soft. She exhibits distension. There is tenderness. There is no rebound and no guarding.  Generalized tenderness throughout right abdomen  Genitourinary:  No fecal impaction.  3 external hemorrhoids  Musculoskeletal:  No peripheral edema   Neurological: She is alert. She exhibits normal muscle tone.  Skin: She is not diaphoretic.  Psychiatric: She has a normal mood and affect. Her behavior is normal.  Nursing note and vitals reviewed.   ED Course  Procedures (including critical care time) Labs Review Labs Reviewed  COMPREHENSIVE METABOLIC PANEL - Abnormal; Notable for the following:    Potassium 3.4 (*)    Chloride 99 (*)    CO2 18 (*)    Glucose, Bld 113 (*)    Calcium 8.7 (*)     Albumin 2.9 (*)    Alkaline Phosphatase 302 (*)    Anion gap 18 (*)    All other components within normal limits  URINALYSIS, ROUTINE W REFLEX MICROSCOPIC (NOT AT [facility]) - Abnormal; Notable for the following:    Color, Urine AMBER (*)    APPearance CLOUDY (*)    Specific Gravity, Urine 1.034 (*)    Bilirubin Urine MODERATE (*)    Protein, ur 30 (*)    All other components within normal limits  CBC WITH DIFFERENTIAL/PLATELET - Abnormal; Notable for the following:    WBC 3.8 (*)    RBC 5.32 (*)    MCV 72.4 (*)    MCH 23.5 (*)    RDW 16.4 (*)    Neutro Abs 1.5 (*)    Monocytes Absolute 1.4 (*)    All other components within normal limits  URINE MICROSCOPIC-ADD ON - Abnormal; Notable for the following:    Squamous Epithelial / LPF 0-5 (*)    Bacteria, UA RARE (*)    Casts HYALINE CASTS (*)    Crystals CA OXALATE CRYSTALS (*)    All other components within normal limits  MAGNESIUM - Abnormal; Notable for the following:    Magnesium 1.6 (*)    All other components within normal limits  I-STAT CG4 LACTIC ACID, ED - Abnormal; Notable for the following:    Lactic Acid, Venous 3.02 (*)    All other components within normal limits  CULTURE, BLOOD (ROUTINE X 2)  CULTURE, BLOOD (ROUTINE X 2)  URINE CULTURE  LIPASE, BLOOD  I-STAT CG4 LACTIC ACID, ED    Imaging Review Dg Abd Acute W/chest  12/17/2015  CLINICAL DATA:  Back pain and right side pain since chemotherapy 2 weeks ago. Vomiting. EXAM: DG ABDOMEN ACUTE W/ 1V CHEST COMPARISON:  Chest CT 11/06/2015 FINDINGS: Right Port-A-Cath is in place with the tip in the right atrium. Linear densities at the left base, likely scarring or atelectasis. No effusions. Heart is normal size. Nonobstructive bowel gas pattern. Moderate stool in the colon. Metallic biliary stent in place. No organomegaly or free air. IMPRESSION: Moderate stool burden. No evidence of bowel obstruction or free air. Left basilar atelectasis or scarring.  No active disease.  Electronically Signed   By: Rolm Baptise M.D.  On: 12/17/2015 13:22   I have personally reviewed and evaluated these images and lab results as part of my medical decision-making.   EKG Interpretation   Date/Time:  Tuesday December 17 2015 12:29:37 EST Ventricular Rate:  133 PR Interval:  149 QRS Duration: 98 QT Interval:  419 QTC Calculation: 623 R Axis:   95 Text Interpretation:  Sinus tachycardia Consider right atrial enlargement  Anterior infarct, old Prolonged QT interval QT is prolonged - will repeat  Confirmed by Kathrynn Humble, MD, Thelma Comp [phone]) on 12/17/2015 2:01:54 PM         MDM   Final diagnoses:  Dehydration  Non-intractable vomiting with nausea, vomiting of unspecified type  Generalized abdominal pain  Constipation, unspecified constipation type    Afebrile nontoxic thin, dry, patient with pancreatic cancer with right sided abdominal pain, N/V x 2 weeks, no BM x 1 week.   Pt discussed with Dr Kathrynn Humble who also saw and examined the patient. Labs significant for dehydration, lactic acidosis and anion gap likely reflex this.  Doubt sepsis.   Moderate amount of stool on xray.  No fecal impaction on exam.  CT abd/pelvis pending at change of shift.  Discussed with Quincy Carnes, PA-C, who assumes care of patient pending CT.      Clayton Bibles, PA-C 12/17/15 1603  Varney Biles, MD 12/18/15 [phone]

## 2015-12-17 NOTE — ED Notes (Signed)
Lab Delay - RN could not pull any blood from port.  Per RN, will let fluids run for 30 min and will try to pull blood again.  Pt would not let me stick her - wants to see if RN can get it from port.

## 2015-12-18 ENCOUNTER — Other Ambulatory Visit: Payer: Commercial Managed Care - HMO

## 2015-12-18 ENCOUNTER — Inpatient Hospital Stay (HOSPITAL_COMMUNITY): Payer: Commercial Managed Care - HMO

## 2015-12-18 ENCOUNTER — Ambulatory Visit: Payer: Commercial Managed Care - HMO

## 2015-12-18 ENCOUNTER — Ambulatory Visit: Payer: Commercial Managed Care - HMO | Admitting: Family

## 2015-12-18 ENCOUNTER — Telehealth: Payer: Self-pay | Admitting: Hematology & Oncology

## 2015-12-18 DIAGNOSIS — E440 Moderate protein-calorie malnutrition: Secondary | ICD-10-CM | POA: Insufficient documentation

## 2015-12-18 LAB — BASIC METABOLIC PANEL
Anion gap: 11 (ref 5–15)
BUN: 15 mg/dL (ref 6–20)
CALCIUM: 7.8 mg/dL — AB (ref 8.9–10.3)
CO2: 18 mmol/L — AB (ref 22–32)
Chloride: 105 mmol/L (ref 101–111)
Creatinine, Ser: 0.57 mg/dL (ref 0.44–1.00)
GFR calc Af Amer: >60 mL/min (ref >60)
GLUCOSE: 132 mg/dL — AB (ref 65–99)
Potassium: 3.5 mmol/L (ref 3.5–5.1)
Sodium: 134 mmol/L — ABNORMAL LOW (ref 135–145)

## 2015-12-18 LAB — BODY FLUID CELL COUNT WITH DIFFERENTIAL
EOS FL: 1 %
LYMPHS FL: 17 %
MONOCYTE-MACROPHAGE-SEROUS FLUID: 61 % (ref 50–90)
NEUTROPHIL FLUID: 21 % (ref 0–25)
Total Nucleated Cell Count, Fluid: 313 cu mm (ref 0–1000)

## 2015-12-18 LAB — CBC
HEMATOCRIT: 31.9 % — AB (ref 36.0–46.0)
HEMOGLOBIN: 10.2 g/dL — AB (ref 12.0–15.0)
MCH: 23.2 pg — ABNORMAL LOW (ref 26.0–34.0)
MCHC: 32 g/dL (ref 30.0–36.0)
MCV: 72.7 fL — AB (ref 78.0–100.0)
Platelets: 430 10*3/uL — ABNORMAL HIGH (ref 150–400)
RBC: 4.39 MIL/uL (ref 3.87–5.11)
RDW: 16.7 % — AB (ref 11.5–15.5)
WBC: 3.3 10*3/uL — AB (ref 4.0–10.5)

## 2015-12-18 LAB — URINE CULTURE: Culture: NO GROWTH

## 2015-12-18 LAB — GLUCOSE, SEROUS FLUID: Glucose, Fluid: 78 mg/dL

## 2015-12-18 LAB — GRAM STAIN

## 2015-12-18 LAB — HEPARIN LEVEL (UNFRACTIONATED)
HEPARIN UNFRACTIONATED: 0.45 [IU]/mL (ref 0.30–0.70)
Heparin Unfractionated: 0.45 IU/mL (ref 0.30–0.70)

## 2015-12-18 LAB — GLUCOSE, CAPILLARY
GLUCOSE-CAPILLARY: 100 mg/dL — AB (ref 65–99)
GLUCOSE-CAPILLARY: 103 mg/dL — AB (ref 65–99)
GLUCOSE-CAPILLARY: 119 mg/dL — AB (ref 65–99)
GLUCOSE-CAPILLARY: 131 mg/dL — AB (ref 65–99)
Glucose-Capillary: 131 mg/dL — ABNORMAL HIGH (ref 65–99)

## 2015-12-18 LAB — LACTATE DEHYDROGENASE, PLEURAL OR PERITONEAL FLUID: LD FL: 517 U/L — AB (ref 3–23)

## 2015-12-18 LAB — PROTEIN, BODY FLUID: Total protein, fluid: 4 g/dL

## 2015-12-18 MED ORDER — TRAMADOL HCL 50 MG PO TABS
50.0000 mg | ORAL_TABLET | Freq: Four times a day (QID) | ORAL | Status: DC | PRN
  Administered 2015-12-18 – 2015-12-19 (×2): 50 mg via ORAL
  Filled 2015-12-18 (×2): qty 1

## 2015-12-18 MED ORDER — ENSURE ENLIVE PO LIQD
237.0000 mL | Freq: Two times a day (BID) | ORAL | Status: DC
  Administered 2015-12-18 – 2015-12-24 (×7): 237 mL via ORAL

## 2015-12-18 MED ORDER — SODIUM CHLORIDE 0.9% FLUSH
10.0000 mL | INTRAVENOUS | Status: DC | PRN
  Administered 2015-12-20 – 2015-12-24 (×5): 10 mL
  Filled 2015-12-18 (×5): qty 40

## 2015-12-18 MED ORDER — DOCUSATE SODIUM 100 MG PO CAPS
100.0000 mg | ORAL_CAPSULE | Freq: Two times a day (BID) | ORAL | Status: DC
  Administered 2015-12-19 – 2015-12-23 (×4): 100 mg via ORAL
  Filled 2015-12-18 (×13): qty 1

## 2015-12-18 NOTE — Progress Notes (Signed)
ANTICOAGULATION CONSULT NOTE - Follow Up Consult  Pharmacy Consult for Heparin Indication: DVT  No Known Allergies  Patient Measurements: Height: 5' (152.4 cm) Weight: 112 lb 12.8 oz (51.166 kg) IBW/kg (Calculated) : 45.5 Heparin Dosing Weight: actual body weight  Vital Signs: Temp: 98.3 F (36.8 C) (02/15 0623) Temp Source: Oral (02/15 0623) BP: 148/84 mmHg (02/15 0623) Pulse Rate: 125 (02/15 0623)  Labs:  Recent Labs  12/17/15 1441 12/17/15 1715 12/17/15 2324 12/18/15 0502 12/18/15 0949  HGB 12.5  --   --  10.2*  --   HCT 38.5  --   --  31.9*  --   PLT 372  --   --  430*  --   APTT  --  104*  --   --   --   LABPROT  --  20.8*  --   --   --   INR  --  1.79*  --   --   --   HEPARINUNFRC  --  0.95* 0.45  --  0.45  CREATININE 0.62  --   --  0.57  --     Estimated Creatinine Clearance: 48.3 mL/min (by C-G formula based on Cr of 0.57).   Medications:  Scheduled:  . atenolol  50 mg Oral Daily  . insulin aspart  0-9 Units Subcutaneous 6 times per day  . lipase/protease/amylase  36,000 Units Oral TID WC  . metoCLOPramide (REGLAN) injection  5 mg Intravenous 4 times per day  . metoprolol  2.5 mg Intravenous 4 times per day  . sodium chloride flush  3 mL Intravenous Q12H   Infusions:  . sodium chloride 75 mL/hr at 12/18/15 0103  . heparin 850 Units/hr (12/18/15 [id number])    Assessment:  70 yr female with pancreatic cancer, currently undergoing chemotherapy presents with abdominal pain, N/V, dysuria and constipation. Recently completed 3 day course of antibiotics for UTI  CT Angio revealed + left common femoral vein DVT, as well as ascites consistent with peritoneal carcinomatosis and 2 questionable left lower lobe pulmonary nodules.  Pharmacy consulted to dose IV heparin for +DVT  Patient on no oral anticoagulation PTA Today, 2/15  2324 HL=0.45 units/ml  0949 HL=0.45 units/ml Goal of Therapy:  Heparin level 0.3-0.7 units/ml Monitor platelets by  anticoagulation protocol: Yes   Plan:   Continue heparin infusion @850  units/hr for now  IR to perform paracentesis today, f/u heparin restart post-procedure  Check daily HL   Monitor CBC, s/s bleeding  Darvin Neighbours  [facility] PharmD Candidate 12/18/2015,10:15 AM

## 2015-12-18 NOTE — Progress Notes (Addendum)
Patient ID: Jacqueline Hahn, female   DOB: May 08, 1946, 70 y.o.   MRN: [id number] [facility] PROGRESS NOTE  Jacqueline Hahn [id number] DOB: 1945/12/19 DOA: 12/17/2015 PCP: Annye Asa, MD  Brief narrative:    70 y.o. female with advanced pancreatic cancer (diagnosed 07/2014) receiving chemo Rx who presented to [facility] ED with complaints of worsening abdominal pain and distention over past few days prior to this admission. She had CT abd which was significant for ascites and left femoral vein DVT. She underwent paracentesis with 4.2 L fluid removed. She was started on heparin drip for anticoagulation.   Assessment/Plan:    Principal Problem:   DVT (deep venous thrombosis), left - In the setting of advanced pancreatic cancer - On heparin drip   Active Problems:   Abdominal distention secondary to malignant ascites in the setting of pancreatic cancer / Nausea and vomiting  - Underwent paracentesis 2/15 with 4.2 L fluids removed and sent for analysis - Continue supportive care with analgesia as needed  - N/V resolved     Pancreatic cancer metastasized to lung [facility]) - Per oncology - Continue pancreatic lipase supplements     Essential hypertension - Continue atenolol and metoprolol     Hypokalemia - Due to GI losses - Supplemented - Now WNL    Diabetes mellitus without complication without long term insulin use - Now on SSI until appetite better - Takes glipizide at home     Malnutrition of moderate degree - In the setting of chronic illness - Nutrition consulted    Anemia of chronic disease - Due to malignancy - Hemoglobin stable     DVT Prophylaxis  - Heparin drip    Code Status: Full.  Family Communication:  plan of care discussed with the patient Disposition Plan: Home once she feels better, hopefully by 2/18  IV access:  Peripheral IV  Procedures and diagnostic studies:    Ct Abdomen Pelvis W Contrast 12/17/2015  Significant ascites with omental infiltration as  well as probable tumor adjacent to the hepatic flexure and gallbladder consistent with peritoneal carcinomatosis. Filling defect in LEFT common femoral vein consistent with deep venous thrombosis. Cannot exclude wall thickening at the gastric antrum/pylorus; this could be related to ulcer disease/gastritis, radiation therapy, or tumor. Two questionable tiny LEFT lower lobe pulmonary nodules.   US Paracentesis 12/18/2015 Successful ultrasound-guided diagnostic and therapeutic paracentesis yielding 4.2 liters liters of peritoneal fluid.   Dg Abd Acute W/chest 12/17/2015  Moderate stool burden. No evidence of bowel obstruction or free air. Left basilar atelectasis or scarring.  No active disease.   Medical Consultants:  IR  IAnti-Infectives:   None    Leisa Lenz, MD  [facility] Pager [phone]  Time spent in minutes: 25 minutes  If 7PM-7AM, please contact night-coverage [email or web address] Password [email or web address] 12/18/2015, 6:27 PM   LOS: 1 day    HPI/Subjective: No acute overnight events. Patient reports no further vomiting.  Objective: Filed Vitals:   12/18/15 1213 12/18/15 1217 12/18/15 1225 12/18/15 1305  BP: 134/77 140/66 142/65 144/64  Pulse:    115  Temp:    98.6 F (37 C)  TempSrc:    Oral  Resp:    16  Height:      Weight:      SpO2:    99%    Intake/Output Summary (Last 24 hours) at 12/18/15 1827 Last data filed at 12/18/15 0644  Gross per 24 hour  Intake    790 ml  Output  0 ml  Net    790 ml    Exam:   General:  Pt is alert, follows commands appropriately, not in acute distress  Cardiovascular: Regular rate and rhythm, S1/S2 (+)  Respiratory: Clear to auscultation bilaterally, no wheezing, no crackles, no rhonchi  Abdomen:  distended, bowel sounds present  Extremities: No edema, pulses DP and PT palpable bilaterally  Neuro: Grossly nonfocal  Data Reviewed: Basic Metabolic Panel:  Recent Labs Lab 12/17/15 1441 12/18/15 0502  NA 135 134*  K  3.4* 3.5  CL 99* 105  CO2 18* 18*  GLUCOSE 113* 132*  BUN 19 15  CREATININE 0.62 0.57  CALCIUM 8.7* 7.8*  MG 1.6*  --    Liver Function Tests:  Recent Labs Lab 12/17/15 1441  AST 34  ALT 33  ALKPHOS 302*  BILITOT 0.8  PROT 6.9  ALBUMIN 2.9*    Recent Labs Lab 12/17/15 1441  LIPASE 12   No results for input(s): AMMONIA in the last 168 hours. CBC:  Recent Labs Lab 12/17/15 1441 12/18/15 0502  WBC 3.8* 3.3*  NEUTROABS 1.5*  --   HGB 12.5 10.2*  HCT 38.5 31.9*  MCV 72.4* 72.7*  PLT 372 430*   Cardiac Enzymes: No results for input(s): CKTOTAL, CKMB, CKMBINDEX, TROPONINI in the last 168 hours. BNP: Invalid input(s): POCBNP CBG:  Recent Labs Lab 12/17/15 2014 12/17/15 2322 12/18/15 0522 12/18/15 0749 12/18/15 1344  GLUCAP 142* 123* 119* 131* 131*    Recent Results (from the past 240 hour(s))  Urine culture     Status: None   Collection Time: 12/17/15 12:45 PM  Result Value Ref Range Status   Specimen Description URINE, CLEAN CATCH  Final   Special Requests NONE  Final   Culture   Final    NO GROWTH 1 DAY Performed at [facility]    Report Status 12/18/2015 FINAL  Final  Culture, blood (routine x 2)     Status: None (Preliminary result)   Collection Time: 12/17/15  2:41 PM  Result Value Ref Range Status   Specimen Description BLOOD LEFT HAND  Final   Special Requests IN PEDIATRIC BOTTLE [place]  Final   Culture   Final    NO GROWTH < 24 HOURS Performed at [facility]    Report Status PENDING  Incomplete  Culture, blood (routine x 2)     Status: None (Preliminary result)   Collection Time: 12/17/15 11:24 PM  Result Value Ref Range Status   Specimen Description BLOOD LEFT HAND  Final   Special Requests   Final    IN PEDIATRIC BOTTLE 3CC Performed at [facility]    Culture PENDING  Incomplete   Report Status PENDING  Incomplete  Gram stain     Status: None   Collection Time: 12/18/15  1:19 PM  Result Value Ref Range  Status   Specimen Description FLUID PERITONEAL  Final   Special Requests NONE  Final   Gram Stain   Final    FEW WBC PRESENT,BOTH PMN AND MONONUCLEAR NO ORGANISMS SEEN Performed at [facility]    Report Status 12/18/2015 FINAL  Final     Scheduled Meds: . atenolol  50 mg Oral Daily  . docusate sodium  100 mg Oral BID  . feeding supplement (ENSURE ENLIVE)  237 mL Oral BID BM  . insulin aspart  0-9 Units Subcutaneous 6 times per day  . lipase/protease/amylase  36,000 Units Oral TID WC  . metoCLOPramide (REGLAN)  injection  5 mg Intravenous 4 times per day  . metoprolol  2.5 mg Intravenous 4 times per day  . sodium chloride flush  3 mL Intravenous Q12H   Continuous Infusions: . sodium chloride 75 mL/hr at 12/18/15 1538  . heparin 850 Units/hr (12/18/15 1718)

## 2015-12-18 NOTE — Telephone Encounter (Addendum)
SILVERBACKBrien [id number] Visits: 12 Status: Approved Dates: 12/02/2015 - 05/31/2016  Chemo: [id number] - Ciprofloxacin   and   SILVERBACK: Auth [id number] Visits: 12 Status: Approved Dates: 12/17/2015 - 06/16/2016  Chemo: [id number] - Saline [id number] - Ketorolac [id number] - Metoclopramide [id number] - Herparin     COPY SCANNED

## 2015-12-18 NOTE — Progress Notes (Addendum)
Initial Nutrition Assessment  DOCUMENTATION CODES:   Non-severe (moderate) malnutrition in context of chronic illness  INTERVENTION:  - Will order Ensure Enlive po BID, each supplement provides 350 kcal and 20 grams of protein - Encourage PO intakes of meals and supplements - RD will continue to monitor for needs  NUTRITION DIAGNOSIS:   Increased nutrient needs related to catabolic illness, cancer and cancer related treatments as evidenced by estimated needs.  GOAL:   Patient will meet greater than or equal to 90% of their needs  MONITOR:   PO intake, Supplement acceptance, Weight trends, Labs, I & O's  REASON FOR ASSESSMENT:   Malnutrition Screening Tool  ASSESSMENT:   70 y.o. female with advanced pancreatic cancer (diagnosed 07/2014) receiving Chemo Rx who presents to the ED with complaints of Increased ABD pain , Nausea and Vomiting x 2 weeks.She reports that she has not been able to hold down any food or liquids for the past 2 days.She denies fevers and chills and diarrhea.She does report constipation. The ABD pain is all over, and is currently a 7/10. A ct Scan was performed in the ED, and revealed progression of disease, and a LLE DVT. She was started on an IV Heparin drip and referred for admission.   Pt seen for MST. BMI indicates normal weight. No intakes documented since admission. Pt reports 75% completion of breakfast: sausage and oatmeal. She states no nausea/vomiting or abdominal pain after intakes this AM. Pt reports ongoing N/V and abdominal pain with intakes for almost 3 weeks PTA and states that this happened intermittently but did include all food and liquid items. She states that symptoms were worse for the 2 days PTA. She denies ever having taste alterations or diarrhea, states she was experiencing constipation and abdominal discomfort related to this.   Pt states that at home she was only able to take a few bites/sips before feeling uncomfortably full.  She was drinking Ensure and Boost PTA although she prefers Ensure; pt appreciative of offer to order Ensure during hospitalization. She states that if she drinks supplements too often they do cause her to have loose stools. She denies chewing or swallowing issues.   Physical assessment shows moderate muscle and mild fat wasting to upper body and no edema; did not assess legs during this visit. Pt states that since June 2016 she has been losing weight. She reports that she had permanent gastric stent placed at that time and has been unable to eat as much per meal due to this but denies needing to eat soft or altered texture foods related to stent. Per chart review, pt has lost 14 lbs (11% body weight) in the past 3 months which is significant for time frame.   Not meeting needs. Medications reviewed. Labs reviewed; CBGs: 81-142 mg/dL, Na: 134 mmol/L, Ca: 7.8 mg/dL.  ADDENDUM: 5 mg Reglan every 6 hours.   Diet Order:  Diet heart healthy/carb modified Room service appropriate?: Yes; Fluid consistency:: Thin  Skin:  Reviewed, no issues  Last BM:  PTA  Height:   Ht Readings from Last 1 Encounters:  12/17/15 5' (1.524 m)    Weight:   Wt Readings from Last 1 Encounters:  12/18/15 112 lb 12.8 oz (51.166 kg)    Ideal Body Weight:  45.45 kg (kg)  BMI:  Body mass index is 22.03 kg/(m^2).  Estimated Nutritional Needs:   Kcal:  1530-1785 (30-35 kcal/kg)  Protein:  70-82 grams (1.4-1.6 grams/kg)  Fluid:  >/= 2 L/day  EDUCATION  NEEDS:   No education needs identified at this time     Jarome Matin, [place], [facility] Inpatient Clinical Dietitian Pager # [phone] After hours/weekend pager # [phone]

## 2015-12-18 NOTE — Progress Notes (Signed)
ANTICOAGULATION CONSULT NOTE - F/u Consult  Pharmacy Consult for Heparin Indication: DVT  No Known Allergies  Patient Measurements: Height: 5' (152.4 cm) Weight: 117 lb 9.6 oz (53.343 kg) IBW/kg (Calculated) : 45.5  Height = 61 inches Weight = 50.8 kg (12/04/15) Heparin Dosing Weight: actual body weight  Vital Signs: Temp: 98.1 F (36.7 C) (02/14 2315) Temp Source: Oral (02/14 2315) BP: 126/79 mmHg (02/14 2315) Pulse Rate: 114 (02/14 2315)  Labs:  Recent Labs  12/17/15 1441 12/17/15 1715 12/17/15 2324  HGB 12.5  --   --   HCT 38.5  --   --   PLT 372  --   --   APTT  --  104*  --   LABPROT  --  20.8*  --   INR  --  1.79*  --   HEPARINUNFRC  --  0.95* 0.45  CREATININE 0.62  --   --     Estimated Creatinine Clearance: 48.3 mL/min (by C-G formula based on Cr of 0.62).   Medical History: Past Medical History  Diagnosis Date  . Hypertension   . UTI (urinary tract infection)   . History of stomach ulcers   . Diabetes mellitus without complication ([place])   . Complication of anesthesia     hx of n/v  . PONV (postoperative nausea and vomiting)   . Pancreatic cancer metastasized to lung ([place]) 07/10/2014    Medications:  Scheduled:  . atenolol  50 mg Oral Daily  . insulin aspart  0-9 Units Subcutaneous 6 times per day  . lipase/protease/amylase  36,000 Units Oral TID WC  . metoCLOPramide (REGLAN) injection  5 mg Intravenous 4 times per day  . metoprolol  2.5 mg Intravenous 4 times per day  . sodium chloride flush  3 mL Intravenous Q12H   Infusions:  . sodium chloride 75 mL/hr at 12/18/15 0103  . heparin 850 Units/hr (12/17/15 1700)    Assessment:  70 yr female with pancreatic cancer, currently undergoing chemotherapy presents with abdominal pain, N/V, dysuria and constipation.  Recently completed 3 day course of antibiotics for UTI  CT Angio revealed + left common femoral vein DVT, as well as ascites consistent with peritoneal carcinomatosis and 2 questionable  left lower lobe pulmonary nodules.  Pharmacy consulted to dose IV heparin for +DVT  Patient on no oral anticoagulation PTA Today, 2/15  2324 HL=0.45 units/ml  Goal of Therapy:  Heparin level 0.3-0.7 units/ml Monitor platelets by anticoagulation protocol: Yes   Plan:   Continue heparin  infusion @ 850 units/hr  Recheck HL at 9 am today   Follow heparin level & CBC daily  Dorrene German, PharmD 12/18/2015,2:03 AM

## 2015-12-18 NOTE — Procedures (Signed)
Ultrasound-guided diagnostic and therapeutic paracentesis performed yielding 4.2 liters of yellow fluid. No immediate complications. A portion of the fluid was sent to the lab for preordered studies.

## 2015-12-19 ENCOUNTER — Ambulatory Visit (HOSPITAL_COMMUNITY): Payer: Commercial Managed Care - HMO

## 2015-12-19 DIAGNOSIS — R110 Nausea: Secondary | ICD-10-CM

## 2015-12-19 DIAGNOSIS — R634 Abnormal weight loss: Secondary | ICD-10-CM

## 2015-12-19 DIAGNOSIS — C772 Secondary and unspecified malignant neoplasm of intra-abdominal lymph nodes: Secondary | ICD-10-CM

## 2015-12-19 DIAGNOSIS — E46 Unspecified protein-calorie malnutrition: Secondary | ICD-10-CM

## 2015-12-19 DIAGNOSIS — C259 Malignant neoplasm of pancreas, unspecified: Secondary | ICD-10-CM

## 2015-12-19 DIAGNOSIS — Z86718 Personal history of other venous thrombosis and embolism: Secondary | ICD-10-CM

## 2015-12-19 LAB — CBC
HEMATOCRIT: 32.6 % — AB (ref 36.0–46.0)
HEMOGLOBIN: 10.4 g/dL — AB (ref 12.0–15.0)
MCH: 23.3 pg — ABNORMAL LOW (ref 26.0–34.0)
MCHC: 31.9 g/dL (ref 30.0–36.0)
MCV: 72.9 fL — AB (ref 78.0–100.0)
Platelets: 449 10*3/uL — ABNORMAL HIGH (ref 150–400)
RBC: 4.47 MIL/uL (ref 3.87–5.11)
RDW: 16.6 % — AB (ref 11.5–15.5)
WBC: 4.1 10*3/uL (ref 4.0–10.5)

## 2015-12-19 LAB — GLUCOSE, CAPILLARY
GLUCOSE-CAPILLARY: 107 mg/dL — AB (ref 65–99)
GLUCOSE-CAPILLARY: 82 mg/dL (ref 65–99)
Glucose-Capillary: 126 mg/dL — ABNORMAL HIGH (ref 65–99)
Glucose-Capillary: 99 mg/dL (ref 65–99)
Glucose-Capillary: 112 mg/dL — ABNORMAL HIGH (ref 65–99)

## 2015-12-19 LAB — HEMOGLOBIN A1C
Hgb A1c MFr Bld: 7 % — ABNORMAL HIGH (ref 4.8–5.6)
Mean Plasma Glucose: 154 mg/dL

## 2015-12-19 LAB — HEPARIN LEVEL (UNFRACTIONATED): Heparin Unfractionated: 0.29 IU/mL — ABNORMAL LOW (ref 0.30–0.70)

## 2015-12-19 MED ORDER — RIVAROXABAN 15 MG PO TABS
15.0000 mg | ORAL_TABLET | Freq: Two times a day (BID) | ORAL | Status: DC
  Administered 2015-12-19 (×2): 15 mg via ORAL
  Filled 2015-12-19 (×2): qty 1

## 2015-12-19 MED ORDER — RIVAROXABAN 20 MG PO TABS: 20.0000 mg | ORAL_TABLET | Freq: Every day | ORAL | Status: DC

## 2015-12-19 NOTE — Progress Notes (Signed)
Pt's co pay for Eliquis is $47.00 for 30 days supply and $131.00 for mail in 90 days supply.

## 2015-12-19 NOTE — Discharge Instructions (Signed)
Information on my medicine - XARELTO (rivaroxaban)  This medication education was reviewed with me or my healthcare representative as part of my discharge preparation.  The pharmacist that spoke with me during my hospital stay was:  Sumit Branham A, [place]? Xarelto was prescribed to treat blood clots that may have been found in the veins of your legs (deep vein thrombosis) or in your lungs (pulmonary embolism) and to reduce the risk of them occurring again.  What do you need to know about Xarelto? The starting dose is one 15 mg tablet taken TWICE daily with food for the FIRST 21 DAYS then on (enter date)  01/09/2016  the dose is changed to one 20 mg tablet taken ONCE A DAY with your evening meal.  DO NOT stop taking Xarelto without talking to the health care provider who prescribed the medication.  Refill your prescription for 20 mg tablets before you run out.  After discharge, you should have regular check-up appointments with your healthcare provider that is prescribing your Xarelto.  In the future your dose may need to be changed if your kidney function changes by a significant amount.  What do you do if you miss a dose? If you are taking Xarelto TWICE DAILY and you miss a dose, take it as soon as you remember. You may take two 15 mg tablets (total 30 mg) at the same time then resume your regularly scheduled 15 mg twice daily the next day.  If you are taking Xarelto ONCE DAILY and you miss a dose, take it as soon as you remember on the same day then continue your regularly scheduled once daily regimen the next day. Do not take two doses of Xarelto at the same time.   Important Safety Information Xarelto is a blood thinner medicine that can cause bleeding. You should call your healthcare provider right away if you experience any of the following: ? Bleeding from an injury or your nose that does not stop. ? Unusual colored urine (red or dark brown) or  unusual colored stools (red or black). ? Unusual bruising for unknown reasons. ? A serious fall or if you hit your head (even if there is no bleeding).  Some medicines may interact with Xarelto and might increase your risk of bleeding while on Xarelto. To help avoid this, consult your healthcare provider or pharmacist prior to using any new prescription or non-prescription medications, including herbals, vitamins, non-steroidal anti-inflammatory drugs (NSAIDs) and supplements.  This website has more information on Xarelto: [email or web address]

## 2015-12-19 NOTE — Progress Notes (Signed)
VASCULAR LAB PRELIMINARY  PRELIMINARY  PRELIMINARY  PRELIMINARY  Bilateral lower extremity venous duplex completed.    Preliminary report:  Right:  DVT noted in the distal popliteal .  No evidence of superficial thrombosis.  No Baker's cyst. Left: DVT noted in the common femoral vein coursing from just below the saphenofemoral junction to the proximal region.. Also noted is a of superficial thrombosis of the proximal greater saphenous vein coursing through the saphenofemoral junction.  No Baker's cyst.  Emberly Tomasso, RVS 12/19/2015, 2:51 PM

## 2015-12-19 NOTE — Progress Notes (Addendum)
ANTICOAGULATION CONSULT NOTE - Follow Up Consult  Pharmacy Consult for Heparin >> Xarelto Indication: DVT  No Known Allergies  Patient Measurements: Height: 5' (152.4 cm) Weight: 110 lb 3.2 oz (49.986 kg) IBW/kg (Calculated) : 45.5  Vital Signs: Temp: 98 F (36.7 C) (02/16 0617) Temp Source: Oral (02/16 0617) BP: 132/72 mmHg (02/16 0617) Pulse Rate: 99 (02/16 0617)  Labs:  Recent Labs  12/17/15 1441  12/17/15 1715 12/17/15 2324 12/18/15 0502 12/18/15 0949 12/19/15 0441  HGB 12.5  --   --   --  10.2*  --  10.4*  HCT 38.5  --   --   --  31.9*  --  32.6*  PLT 372  --   --   --  430*  --  449*  APTT  --   --  104*  --   --   --   --   LABPROT  --   --  20.8*  --   --   --   --   INR  --   --  1.79*  --   --   --   --   HEPARINUNFRC  --   < > 0.95* 0.45  --  0.45 0.29*  CREATININE 0.62  --   --   --  0.57  --   --   < > = values in this interval not displayed.  Estimated Creatinine Clearance: 48.3 mL/min (by C-G formula based on Cr of 0.57).   Medications:  Scheduled:  . atenolol  50 mg Oral Daily  . docusate sodium  100 mg Oral BID  . feeding supplement (ENSURE ENLIVE)  237 mL Oral BID BM  . insulin aspart  0-9 Units Subcutaneous 6 times per day  . lipase/protease/amylase  36,000 Units Oral TID WC  . metoprolol  2.5 mg Intravenous 4 times per day  . Rivaroxaban  15 mg Oral BID WC  . [START ON 01/09/2016] rivaroxaban  20 mg Oral Q breakfast  . sodium chloride flush  3 mL Intravenous Q12H   Infusions:  . sodium chloride 75 mL/hr at 12/19/15 0647  . heparin 850 Units/hr (12/18/15 1718)    Assessment: 70 yr female with pancreatic cancer, currently undergoing chemotherapy presents with abdominal pain, N/V, dysuria and constipation. Recently completed 3 day course of antibiotics for UTI.  CT Angio revealed + left common femoral vein DVT, as well as ascites consistent with peritoneal carcinomatosis and 2 questionable left lower lobe pulmonary nodules.  Pharmacy  consulted to dose IV heparin for +DVT.  Patient on no oral anticoagulation PTA  Today, 12/19/2015: Pharmacy requested to switch to Xarelto for DVT treatment  Most recent heparin level just slightly subtherapeutic at 0.29  No bleeding per RN or patient  CrCl 48.3; SCr stable  Hgb low but stable around 10; Plt wnl  Drug Interactions: on Megace (prothrombotic effect of progestins), but only takes as needed  Goal of Therapy:  Prevent VTE recurrence   Plan:   Stop IV heparin at 0930 today  Starting at 0930, begin Xarelto 15 mg PO BID with meals, to continue for 21 days.  Starting 01/09/16, dose is 20 mg PO daily with a meal  Follow CBC, SCr  Monitor for signs of bleeding or thrombosis   Reuel Boom, PharmD, BCPS Pager: [phone] 12/19/2015, 9:07 AM

## 2015-12-19 NOTE — Progress Notes (Signed)
Patient ID: Jacqueline Hahn, female   DOB: 1946-09-17, 70 y.o.   MRN: [id number] [facility] PROGRESS NOTE  Jacqueline Hahn [id number] DOB: 1946-08-20 DOA: 12/17/2015 PCP: Annye Asa, MD  Brief narrative:    70 y.o. female with advanced pancreatic cancer (diagnosed 07/2014) receiving chemo Rx who presented to [facility] ED with complaints of worsening abdominal pain and distention over past few days prior to this admission. She had CT abd which was significant for ascites and left femoral vein DVT. She underwent paracentesis with 4.2 L fluid removed. She was started on heparin drip for anticoagulation.   Assessment/Plan:    Principal Problem:   DVT (deep venous thrombosis), left - In the setting of advanced pancreatic cancer - Heparin drip stopped.  - Started xarelto   Active Problems:   Abdominal distention secondary to malignant ascites in the setting of pancreatic cancer / Nausea and vomiting  - Underwent paracentesis 2/15 with 4.2 L fluids removed and sent for analysis - Diet as tolerated    Pancreatic cancer metastasized to lung [facility]) - Appreciate oncology following - Continue pancreatic lipase supplements     Essential hypertension - Continue atenolol and metoprolol  - Blood pressure 146/77    Hypokalemia - Secondary to GI losses. Supplemented    Diabetes mellitus without complication without long term insulin use - Continue sliding scale insulin    Malnutrition of moderate degree - In the setting of chronic illness - Nutrition consulted    Anemia of chronic disease - Secondary to history of malignancy - Hemoglobin stable at 10.4   DVT Prophylaxis  - Started xarelto   Code Status: Full.  Family Communication:  plan of care discussed with the patient Disposition Plan: Hopefully by 2/18  IV access:  Peripheral IV  Procedures and diagnostic studies:    Ct Abdomen Pelvis W Contrast 12/17/2015  Significant ascites with omental infiltration as well as probable tumor  adjacent to the hepatic flexure and gallbladder consistent with peritoneal carcinomatosis. Filling defect in LEFT common femoral vein consistent with deep venous thrombosis. Cannot exclude wall thickening at the gastric antrum/pylorus; this could be related to ulcer disease/gastritis, radiation therapy, or tumor. Two questionable tiny LEFT lower lobe pulmonary nodules.   US Paracentesis 12/18/2015 Successful ultrasound-guided diagnostic and therapeutic paracentesis yielding 4.2 liters liters of peritoneal fluid.   Dg Abd Acute W/chest 12/17/2015  Moderate stool burden. No evidence of bowel obstruction or free air. Left basilar atelectasis or scarring.  No active disease.   Medical Consultants:  IR  IAnti-Infectives:   None    Leisa Lenz, MD  [facility] Pager [phone]  Time spent in minutes: 25 minutes  If 7PM-7AM, please contact night-coverage [email or web address] Password [facility] 12/19/2015, 11:44 AM   LOS: 2 days    HPI/Subjective: No acute overnight events. Patient reports she feels little better.   Objective: Filed Vitals:   12/18/15 2021 12/19/15 0153 12/19/15 0617 12/19/15 0922  BP: 117/63 115/64 132/72 146/77  Pulse: 92 99 99 113  Temp: 97.7 F (36.5 C) 98.1 F (36.7 C) 98 F (36.7 C) 98 F (36.7 C)  TempSrc: Oral Oral Oral Oral  Resp: 16 16 16 16   Height:      Weight:   49.986 kg (110 lb 3.2 oz)   SpO2: 99% 99% 99% 100%    Intake/Output Summary (Last 24 hours) at 12/19/15 1144 Last data filed at 12/19/15 0900  Gross per 24 hour  Intake    820 ml  Output  0 ml  Net    820 ml    Exam:   General:  Pt is not in acute distress  Cardiovascular: RRR, S1/S2 appreciated   Respiratory: no wheezing, no crackles, no rhonchi  Abdomen:  Less distended, bowel sounds (+)  Extremities: No swelling, pulses palpable bilaterally  Neuro: Nonfocal  Data Reviewed: Basic Metabolic Panel:  Recent Labs Lab 12/17/15 1441 12/18/15 0502  NA 135 134*  K 3.4*  3.5  CL 99* 105  CO2 18* 18*  GLUCOSE 113* 132*  BUN 19 15  CREATININE 0.62 0.57  CALCIUM 8.7* 7.8*  MG 1.6*  --    Liver Function Tests:  Recent Labs Lab 12/17/15 1441  AST 34  ALT 33  ALKPHOS 302*  BILITOT 0.8  PROT 6.9  ALBUMIN 2.9*    Recent Labs Lab 12/17/15 1441  LIPASE 12   No results for input(s): AMMONIA in the last 168 hours. CBC:  Recent Labs Lab 12/17/15 1441 12/18/15 0502 12/19/15 0441  WBC 3.8* 3.3* 4.1  NEUTROABS 1.5*  --   --   HGB 12.5 10.2* 10.4*  HCT 38.5 31.9* 32.6*  MCV 72.4* 72.7* 72.9*  PLT 372 430* 449*   Cardiac Enzymes: No results for input(s): CKTOTAL, CKMB, CKMBINDEX, TROPONINI in the last 168 hours. BNP: Invalid input(s): POCBNP CBG:  Recent Labs Lab 12/18/15 1344 12/18/15 2019 12/18/15 2347 12/19/15 0501 12/19/15 0737  GLUCAP 131* 100* 103* 107* 82    Recent Results (from the past 240 hour(s))  Urine culture     Status: None   Collection Time: 12/17/15 12:45 PM  Result Value Ref Range Status   Specimen Description URINE, CLEAN CATCH  Final   Special Requests NONE  Final   Culture   Final    NO GROWTH 1 DAY Performed at [facility]    Report Status 12/18/2015 FINAL  Final  Culture, blood (routine x 2)     Status: None (Preliminary result)   Collection Time: 12/17/15  2:41 PM  Result Value Ref Range Status   Specimen Description BLOOD LEFT HAND  Final   Special Requests IN PEDIATRIC BOTTLE [place]  Final   Culture   Final    NO GROWTH 2 DAYS Performed at [facility]    Report Status PENDING  Incomplete  Culture, blood (routine x 2)     Status: None (Preliminary result)   Collection Time: 12/17/15 11:24 PM  Result Value Ref Range Status   Specimen Description BLOOD LEFT HAND  Final   Special Requests IN PEDIATRIC BOTTLE 3CC  Final   Culture   Final    NO GROWTH 1 DAY Performed at [facility]    Report Status PENDING  Incomplete  Gram stain     Status: None   Collection Time:  12/18/15  1:19 PM  Result Value Ref Range Status   Specimen Description FLUID PERITONEAL  Final   Special Requests NONE  Final   Gram Stain   Final    FEW WBC PRESENT,BOTH PMN AND MONONUCLEAR NO ORGANISMS SEEN Performed at [facility]    Report Status 12/18/2015 FINAL  Final     Scheduled Meds: . atenolol  50 mg Oral Daily  . docusate sodium  100 mg Oral BID  . feeding supplement (ENSURE ENLIVE)  237 mL Oral BID BM  . insulin aspart  0-9 Units Subcutaneous 6 times per day  . lipase/protease/amylase  36,000 Units Oral TID WC  . metoprolol  2.5  mg Intravenous 4 times per day  . Rivaroxaban  15 mg Oral BID WC  . [START ON 01/09/2016] rivaroxaban  20 mg Oral Q breakfast  . sodium chloride flush  3 mL Intravenous Q12H   Continuous Infusions: . sodium chloride 50 mL/hr at 12/19/15 1023

## 2015-12-19 NOTE — Progress Notes (Signed)
Another benefit check for Xarelto revealed that the pt has not met her deductible.  Co-pay for Xarelto  Is $386.76, after deductible is met Xarelto co-pay is $47.00.

## 2015-12-19 NOTE — Consult Note (Signed)
Referral MD  Reason for Referral: Metastatic pancreatic cancer with ascites.   Chief Complaint  Patient presents with  . Emesis  . Chemo patient   . Tachycardia  : I really cannot eat much. My abdomen was distended.  HPI: Jacqueline Hahn is a 70 year old African-American female. She has history of metastatic pancreatic cancer. She was diagnosed back in August 2015. She's been on chemotherapy. She's been through 3 or 4 different courses of chemotherapy. She's done well with transient responses. Unfortunately, it now looks like she is beginning to show resistance. Her last CA 19-9 in January was starting to trend upward. She recently has been treated with FOLFIRI.  She has a having more in the way of nausea. She's had decreased appetite. She was admitted. She had quite a bit of abdominal distention. She underwent a CT scan of the abdomen. This showed ascites. It showed progressive disease. She had peritoneal thickening. She also was found to have a left common femoral vein DVT. She is on heparin.  She underwent a paracentesis yesterday. 4.2 L of fluid was removed. She feels a lot better.  She's had no cough or shortness of breath. She's had no bleeding. She's had no fever.  She's had no positive leg swelling. His been no left leg pain. She's been able to ambulate without problems.  Overall, her performance status is ECOG 2.     Past Medical History  Diagnosis Date  . Hypertension   . UTI (urinary tract infection)   . History of stomach ulcers   . Diabetes mellitus without complication ([place])   . Complication of anesthesia     hx of n/v  . PONV (postoperative nausea and vomiting)   . Pancreatic cancer metastasized to lung [facility]) 07/10/2014  :  Past Surgical History  Procedure Laterality Date  . Dental surgery    . Bunionectomy    . Ercp N/A 06/23/2014    Procedure: ENDOSCOPIC RETROGRADE CHOLANGIOPANCREATOGRAPHY (ERCP);  Surgeon: Ladene Artist, MD;  Location: [facility] ENDOSCOPY;  Service:  Endoscopy;  Laterality: N/A;  . Eus N/A 06/29/2014    Procedure: UPPER ENDOSCOPIC ULTRASOUND (EUS) LINEAR;  Surgeon: Beryle Beams, MD;  Location: [facility] ENDOSCOPY;  Service: Endoscopy;  Laterality: N/A;  . Fine needle aspiration N/A 06/29/2014    Procedure: FINE NEEDLE ASPIRATION (FNA) LINEAR;  Surgeon: Beryle Beams, MD;  Location: [facility] ENDOSCOPY;  Service: Endoscopy;  Laterality: N/A;  . Ercp N/A 04/21/2015    Procedure: ENDOSCOPIC RETROGRADE CHOLANGIOPANCREATOGRAPHY (ERCP) with stent removal and stent replacement;  Surgeon: Milus Banister, MD;  Location: [facility] ORS;  Service: Endoscopy;  Laterality: N/A;  :   Current facility-administered medications:  .  0.9 %  sodium chloride infusion, , Intravenous, Continuous, Theressa Millard, MD, Last Rate: 75 mL/hr at 12/19/15 0647 .  acetaminophen (TYLENOL) tablet 650 mg, 650 mg, Oral, Q6H PRN **OR** acetaminophen (TYLENOL) suppository 650 mg, 650 mg, Rectal, Q6H PRN, Theressa Millard, MD .  alum & mag hydroxide-simeth (MAALOX/MYLANTA) 200-200-20 MG/5ML suspension 30 mL, 30 mL, Oral, Q6H PRN, Theressa Millard, MD, 30 mL at 12/19/15 0314 .  atenolol (TENORMIN) tablet 50 mg, 50 mg, Oral, Daily, Theressa Millard, MD, 50 mg at 12/18/15 1118 .  docusate sodium (COLACE) capsule 100 mg, 100 mg, Oral, BID, Robbie Lis, MD, 100 mg at 12/18/15 2020 .  feeding supplement (ENSURE ENLIVE) (ENSURE ENLIVE) liquid 237 mL, 237 mL, Oral, BID BM, Robbie Lis, MD, 237 mL at 12/18/15 1118 .  [  COMPLETED] heparin bolus via infusion 3,000 Units, 3,000 Units, Intravenous, Once, 3,000 Units at 12/17/15 1705 **FOLLOWED BY** heparin ADULT infusion 100 units/mL (25000 units/250 mL), 850 Units/hr, Intravenous, Continuous, Leann T Poindexter, RPH, Last Rate: 8.5 mL/hr at 12/18/15 1718, 850 Units/hr at 12/18/15 1718 .  HYDROmorphone (DILAUDID) injection 0.5-1 mg, 0.5-1 mg, Intravenous, Q3H PRN, Theressa Millard, MD, 1 mg at 12/19/15 0314 .  insulin aspart (novoLOG) injection 0-9  Units, 0-9 Units, Subcutaneous, 6 times per day, Theressa Millard, MD, 1 Units at 12/18/15 1345 .  iohexol (OMNIPAQUE) 300 MG/ML solution 50 mL, 50 mL, Oral, Once PRN, Varney Biles, MD, 50 mL at 12/17/15 1356 .  Linaclotide (LINZESS) capsule 145 mcg, 145 mcg, Oral, Daily PRN, Theressa Millard, MD .  lipase/protease/amylase (CREON) capsule 36,000 Units, 36,000 Units, Oral, TID WC, Theressa Millard, MD, 36,000 Units at 12/18/15 1718 .  LORazepam (ATIVAN) tablet 0.5 mg, 0.5 mg, Oral, Q6H PRN, Theressa Millard, MD .  metoprolol (LOPRESSOR) injection 2.5 mg, 2.5 mg, Intravenous, 4 times per day, Theressa Millard, MD, 2.5 mg at 12/19/15 0647 .  ondansetron (ZOFRAN) tablet 4 mg, 4 mg, Oral, Q6H PRN **OR** ondansetron (ZOFRAN) injection 4 mg, 4 mg, Intravenous, Q6H PRN, Theressa Millard, MD .  sodium chloride flush (NS) 0.9 % injection 10-40 mL, 10-40 mL, Intracatheter, PRN, Robbie Lis, MD .  sodium chloride flush (NS) 0.9 % injection 3 mL, 3 mL, Intravenous, Q12H, Theressa Millard, MD, 3 mL at 12/18/15 2200 .  traMADol (ULTRAM) tablet 50 mg, 50 mg, Oral, Q6H PRN, Robbie Lis, MD, 50 mg at 12/18/15 1636:  . atenolol  50 mg Oral Daily  . docusate sodium  100 mg Oral BID  . feeding supplement (ENSURE ENLIVE)  237 mL Oral BID BM  . insulin aspart  0-9 Units Subcutaneous 6 times per day  . lipase/protease/amylase  36,000 Units Oral TID WC  . metoprolol  2.5 mg Intravenous 4 times per day  . sodium chloride flush  3 mL Intravenous Q12H  :  No Known Allergies:  Family History  Problem Relation Age of Onset  . Heart attack Mother   . Stroke Mother   . Heart attack Father   . Hyperlipidemia Sister   . Hypertension Sister   . Hypertension Brother   . Alcohol abuse Brother   :  Social History   Social History  . Marital Status: Single    Spouse Name: N/A  . Number of Children: N/A  . Years of Education: N/A   Occupational History  . Not on file.   Social History Main  Topics  . Smoking status: Former Smoker -- 1.00 packs/day for 4 years    Types: Cigarettes    Start date: 02/03/1963    Quit date: 03/05/1967  . Smokeless tobacco: Never Used     Comment: quit 45 years ago  . Alcohol Use: No  . Drug Use: No  . Sexual Activity: Not on file   Other Topics Concern  . Not on file   Social History Narrative  :  Pertinent items are noted in HPI.  Exam: Patient Vitals for the past 24 hrs:  BP Temp Temp src Pulse Resp SpO2 Weight  12/19/15 0617 132/72 mmHg 98 F (36.7 C) Oral 99 16 99 % 110 lb 3.2 oz (49.986 kg)  12/19/15 0153 115/64 mmHg 98.1 F (36.7 C) Oral 99 16 99 % -  12/18/15 2021 117/63 mmHg 97.7 F (36.5 C)  Oral 92 16 99 % -  12/18/15 1305 (!) 144/64 mmHg 98.6 F (37 C) Oral (!) 115 16 99 % -  12/18/15 1225 (!) 142/65 mmHg - - - - - -  12/18/15 1217 140/66 mmHg - - - - - -  12/18/15 1213 134/77 mmHg - - - - - -  12/18/15 1208 (!) 143/72 mmHg - - - - - -  12/18/15 1149 (!) 155/79 mmHg - - - - - -    as above    Recent Labs  12/18/15 0502 12/19/15 0441  WBC 3.3* 4.1  HGB 10.2* 10.4*  HCT 31.9* 32.6*  PLT 430* 449*    Recent Labs  12/17/15 1441 12/18/15 0502  NA 135 134*  K 3.4* 3.5  CL 99* 105  CO2 18* 18*  GLUCOSE 113* 132*  BUN 19 15  CREATININE 0.62 0.57  CALCIUM 8.7* 7.8*    Blood smear review:  None  Pathology: None     Assessment and Plan:  Jacqueline Hahn is a 70 year old African-American female. She has metastatic pancreatic cancer. She was diagnosed about a year and half ago. She has done incredibly well.  I just feel that her disease is becoming more resistant. I think that our options for therapy are limited. I talked to her about single agent Xeloda. She had low-dose Xeloda with radiation. This was quite effective with respect to relieving biliary obstruction.  I talked her about the fact that her cancer is progressing. She understands that any other treatment probably has less than a 20% chance of helping.  There also could be toxicity.  I did talk to her about the possibility of getting hospice. I have not talked to her yet about end-of-life issues and her desires for interventions. I will do this tomorrow.  I think it will would be nice to get a Doppler of her legs to see the extent of this thrombus.  I would probably put her on Xarelto. I  think this would be reasonable for her. Hopefully, insurance will cover this.  While she is in the hospital, I will check a CA-19-9.  Jacqueline Hahn is very reasonable. She also is very real estate with respect to her diagnosis and her future. I am sure that she will not want any aggressive interventions for end-of-life care. Again, I will talk to her about this tomorrow.  I spent about 45 minutes with her today. She is always so nice.  Pete E.  Joshua 1:9

## 2015-12-20 DIAGNOSIS — R180 Malignant ascites: Secondary | ICD-10-CM

## 2015-12-20 DIAGNOSIS — Z7189 Other specified counseling: Secondary | ICD-10-CM

## 2015-12-20 DIAGNOSIS — C7800 Secondary malignant neoplasm of unspecified lung: Secondary | ICD-10-CM

## 2015-12-20 DIAGNOSIS — R112 Nausea with vomiting, unspecified: Secondary | ICD-10-CM

## 2015-12-20 DIAGNOSIS — Z515 Encounter for palliative care: Secondary | ICD-10-CM

## 2015-12-20 DIAGNOSIS — I82412 Acute embolism and thrombosis of left femoral vein: Principal | ICD-10-CM

## 2015-12-20 DIAGNOSIS — C259 Malignant neoplasm of pancreas, unspecified: Secondary | ICD-10-CM

## 2015-12-20 LAB — CBC
HCT: 27.3 % — ABNORMAL LOW (ref 36.0–46.0)
Hemoglobin: 8.8 g/dL — ABNORMAL LOW (ref 12.0–15.0)
MCH: 23.2 pg — ABNORMAL LOW (ref 26.0–34.0)
MCHC: 32.2 g/dL (ref 30.0–36.0)
MCV: 72 fL — AB (ref 78.0–100.0)
PLATELETS: 387 10*3/uL (ref 150–400)
RBC: 3.79 MIL/uL — ABNORMAL LOW (ref 3.87–5.11)
RDW: 16.4 % — AB (ref 11.5–15.5)
WBC: 4.2 10*3/uL (ref 4.0–10.5)

## 2015-12-20 LAB — GLUCOSE, CAPILLARY
GLUCOSE-CAPILLARY: 111 mg/dL — AB (ref 65–99)
GLUCOSE-CAPILLARY: 146 mg/dL — AB (ref 65–99)
GLUCOSE-CAPILLARY: 165 mg/dL — AB (ref 65–99)
GLUCOSE-CAPILLARY: 84 mg/dL (ref 65–99)
GLUCOSE-CAPILLARY: 91 mg/dL (ref 65–99)
Glucose-Capillary: 69 mg/dL (ref 65–99)
Glucose-Capillary: 104 mg/dL — ABNORMAL HIGH (ref 65–99)

## 2015-12-20 LAB — CANCER ANTIGEN 19-9: CA 19 9: 6611 U/mL — AB (ref 0–35)

## 2015-12-20 LAB — PREPARE RBC (CROSSMATCH)

## 2015-12-20 LAB — ABO/RH: ABO/RH(D): O POS

## 2015-12-20 MED ORDER — ALTEPLASE 2 MG IJ SOLR
2.0000 mg | Freq: Once | INTRAMUSCULAR | Status: AC
  Administered 2015-12-20: 2 mg
  Filled 2015-12-20: qty 2

## 2015-12-20 MED ORDER — RIVAROXABAN 15 MG PO TABS
15.0000 mg | ORAL_TABLET | Freq: Two times a day (BID) | ORAL | Status: DC
  Administered 2015-12-20 – 2015-12-22 (×5): 15 mg via ORAL
  Filled 2015-12-20 (×7): qty 1

## 2015-12-20 MED ORDER — FLUCONAZOLE 100 MG PO TABS
100.0000 mg | ORAL_TABLET | Freq: Every day | ORAL | Status: DC
  Administered 2015-12-20 – 2015-12-22 (×3): 100 mg via ORAL
  Filled 2015-12-20 (×5): qty 1

## 2015-12-20 MED ORDER — SODIUM CHLORIDE 0.9 % IV SOLN
Freq: Once | INTRAVENOUS | Status: AC
  Administered 2015-12-20: 15:00:00 via INTRAVENOUS

## 2015-12-20 NOTE — Progress Notes (Signed)
Chief Complaint: Patient was seen in consultation today for placement of peritoneal drainage catheter at the request of Dr. Marin Olp  Referring Physician(s): Dr. Marin Olp  History of Present Illness: Jacqueline Hahn is a 70 y.o. female with metastatic pancreatic cancer. She has developed some ascites and had paracentesis a few days ago with about 4L removed. This proved to be malignant. She is likely headed down a palliative path and IR is asked to consider placement of tunneled peritoneal catheter. She got some relief after the procedure and denies any recurrent abd pain or distention. Her chart is reviewed and she also has new LE DVT for which she is on Xarelto. PMHx, meds, labs, allergies, imaging reviewed.  Past Medical History  Diagnosis Date  . Hypertension   . UTI (urinary tract infection)   . History of stomach ulcers   . Diabetes mellitus without complication ([place])   . Complication of anesthesia     hx of n/v  . PONV (postoperative nausea and vomiting)   . Pancreatic cancer metastasized to lung [facility]) 07/10/2014    Past Surgical History  Procedure Laterality Date  . Dental surgery    . Bunionectomy    . Ercp N/A 06/23/2014    Procedure: ENDOSCOPIC RETROGRADE CHOLANGIOPANCREATOGRAPHY (ERCP);  Surgeon: Ladene Artist, MD;  Location: [facility] ENDOSCOPY;  Service: Endoscopy;  Laterality: N/A;  . Eus N/A 06/29/2014    Procedure: UPPER ENDOSCOPIC ULTRASOUND (EUS) LINEAR;  Surgeon: Beryle Beams, MD;  Location: [facility] ENDOSCOPY;  Service: Endoscopy;  Laterality: N/A;  . Fine needle aspiration N/A 06/29/2014    Procedure: FINE NEEDLE ASPIRATION (FNA) LINEAR;  Surgeon: Beryle Beams, MD;  Location: [facility] ENDOSCOPY;  Service: Endoscopy;  Laterality: N/A;  . Ercp N/A 04/21/2015    Procedure: ENDOSCOPIC RETROGRADE CHOLANGIOPANCREATOGRAPHY (ERCP) with stent removal and stent replacement;  Surgeon: Milus Banister, MD;  Location: [facility] ORS;  Service: Endoscopy;  Laterality: N/A;    Allergies: Review of  patient's allergies indicates no known allergies.  Medications:  Current facility-administered medications:  .  0.9 %  sodium chloride infusion, , Intravenous, Continuous, Robbie Lis, MD, Last Rate: 30 mL/hr at 12/19/15 1937 .  0.9 %  sodium chloride infusion, , Intravenous, Once, [name], MD .  acetaminophen (TYLENOL) tablet 650 mg, 650 mg, Oral, Q6H PRN **OR** acetaminophen (TYLENOL) suppository 650 mg, 650 mg, Rectal, Q6H PRN, Theressa Millard, MD .  alum & mag hydroxide-simeth (MAALOX/MYLANTA) 200-200-20 MG/5ML suspension 30 mL, 30 mL, Oral, Q6H PRN, Theressa Millard, MD, 30 mL at 12/19/15 2233 .  atenolol (TENORMIN) tablet 50 mg, 50 mg, Oral, Daily, Theressa Millard, MD, 50 mg at 12/20/15 0949 .  docusate sodium (COLACE) capsule 100 mg, 100 mg, Oral, BID, Robbie Lis, MD, 100 mg at 12/20/15 0949 .  feeding supplement (ENSURE ENLIVE) (ENSURE ENLIVE) liquid 237 mL, 237 mL, Oral, BID BM, Robbie Lis, MD, 237 mL at 12/20/15 0950 .  fluconazole (DIFLUCAN) tablet 100 mg, 100 mg, Oral, Daily, [name], MD, 100 mg at 12/20/15 0949 .  HYDROmorphone (DILAUDID) injection 0.5-1 mg, 0.5-1 mg, Intravenous, Q3H PRN, Theressa Millard, MD, 1 mg at 12/19/15 2233 .  insulin aspart (novoLOG) injection 0-9 Units, 0-9 Units, Subcutaneous, 6 times per day, Theressa Millard, MD, 1 Units at 12/20/15 1239 .  iohexol (OMNIPAQUE) 300 MG/ML solution 50 mL, 50 mL, Oral, Once PRN, Varney Biles, MD, 50 mL at 12/17/15 1356 .  Linaclotide (LINZESS) capsule 145 mcg,  145 mcg, Oral, Daily PRN, Theressa Millard, MD .  lipase/protease/amylase (CREON) capsule 36,000 Units, 36,000 Units, Oral, TID WC, Theressa Millard, MD, 36,000 Units at 12/20/15 1156 .  LORazepam (ATIVAN) tablet 0.5 mg, 0.5 mg, Oral, Q6H PRN, Theressa Millard, MD, 0.5 mg at 12/19/15 2233 .  metoprolol (LOPRESSOR) injection 2.5 mg, 2.5 mg, Intravenous, 4 times per day, Theressa Millard, MD, 2.5 mg at 12/20/15 1156 .   ondansetron (ZOFRAN) tablet 4 mg, 4 mg, Oral, Q6H PRN **OR** ondansetron (ZOFRAN) injection 4 mg, 4 mg, Intravenous, Q6H PRN, Theressa Millard, MD, 4 mg at 12/19/15 1646 .  Rivaroxaban (XARELTO) tablet 15 mg, 15 mg, Oral, BID WC, [name], MD, 15 mg at 12/20/15 0843 .  sodium chloride flush (NS) 0.9 % injection 10-40 mL, 10-40 mL, Intracatheter, PRN, Robbie Lis, MD, 10 mL at 12/20/15 1322 .  sodium chloride flush (NS) 0.9 % injection 3 mL, 3 mL, Intravenous, Q12H, Theressa Millard, MD, 3 mL at 12/20/15 0952 .  traMADol (ULTRAM) tablet 50 mg, 50 mg, Oral, Q6H PRN, Robbie Lis, MD, 50 mg at 12/19/15 1114    Family History  Problem Relation Age of Onset  . Heart attack Mother   . Stroke Mother   . Heart attack Father   . Hyperlipidemia Sister   . Hypertension Sister   . Hypertension Brother   . Alcohol abuse Brother     Social History   Social History  . Marital Status: Single    Spouse Name: N/A  . Number of Children: N/A  . Years of Education: N/A   Social History Main Topics  . Smoking status: Former Smoker -- 1.00 packs/day for 4 years    Types: Cigarettes    Start date: 02/03/1963    Quit date: 03/05/1967  . Smokeless tobacco: Never Used     Comment: quit 45 years ago  . Alcohol Use: No  . Drug Use: No  . Sexual Activity: Not Asked   Other Topics Concern  . None   Social History Narrative    Review of Systems: A 12 point ROS discussed and pertinent positives are indicated in the HPI above.  All other systems are negative.  Review of Systems  Vital Signs: BP 136/71 mmHg  Pulse 89  Temp(Src) 98.2 F (36.8 C) (Oral)  Resp 18  Ht 5' (1.524 m)  Wt 110 lb 4.8 oz (50.032 kg)  BMI 21.54 kg/m2  SpO2 98%  Physical Exam  Constitutional: She is oriented to person, place, and time. She appears well-developed. No distress.  HENT:  Head: Normocephalic.  Mouth/Throat: Oropharynx is clear and moist.  Neck: Normal range of motion. No tracheal deviation  present.  Cardiovascular: Normal rate, regular rhythm and normal heart sounds.   Pulmonary/Chest: Effort normal and breath sounds normal. No respiratory distress.  Abdominal: Soft. She exhibits no distension and no mass. There is no tenderness.  Neurological: She is alert and oriented to person, place, and time.  Psychiatric: She has a normal mood and affect. Judgment normal.      Imaging: Ct Abdomen Pelvis W Contrast  12/17/2015  CLINICAL DATA:  Pancreatic cancer, unable to keep solids and fluids down, irregular heart rate, fever subjectively, hypertension, diabetes mellitus, former smoker EXAM: CT ABDOMEN AND PELVIS WITH CONTRAST TECHNIQUE: Multidetector CT imaging of the abdomen and pelvis was performed using the standard protocol following bolus administration of intravenous contrast. Sagittal and coronal MPR images reconstructed from axial data set.  CONTRAST:  26mL OMNIPAQUE IOHEXOL 300 MG/ML SOLN IV. Dilute oral contrast. COMPARISON:  None. FINDINGS: Two questionable tiny nodules at LEFT lower lobe. Biliary air consistent with CBD wall stent which is present. No intrahepatic biliary dilatation. Small subcapsular fluid collection at the RIGHT lobe of the liver, 3.4 x 1.9 cm image 19. Remainder of liver, spleen, kidneys, and adrenal glands normal appearance. Soft tissue nodularity adjacent to the gallbladder with thickening of the wall of the hepatic flexure compatible with tumor. Scattered mild infiltrative changes of the greater omentum compatible with tumor. Significant ascites throughout abdomen and pelvis. Peritoneal thickening in the lateral LEFT pelvis consistent with peritoneal carcinomatosis. Unremarkable bladder and ovaries. Small nodule at uterus 13 mm diameter question small leiomyoma. Atrophic pancreas with ductal dilatation at the body and tail. Large and small bowel loops normal. Questionable gastric antral and pyloric wall thickening. Thrombus within LEFT common femoral vein  consistent with deep venous thrombosis. Remaining vascular structures appear grossly patent. No free intraperitoneal air or evidence of bowel obstruction. Osseous structures unremarkable. IMPRESSION: Significant ascites with omental infiltration as well as probable tumor adjacent to the hepatic flexure and gallbladder consistent with peritoneal carcinomatosis. Filling defect in LEFT common femoral vein consistent with deep venous thrombosis. Cannot exclude wall thickening at the gastric antrum/pylorus; this could be related to ulcer disease/gastritis, radiation therapy, or tumor. Two questionable tiny LEFT lower lobe pulmonary nodules. Findings called to Quincy Carnes in ED on 12/17/2015 at 1620 hours. Electronically Signed   By: Lavonia Dana M.D.   On: 12/17/2015 16:20   US Paracentesis  12/18/2015  INDICATION: Metastatic pancreatic carcinoma, ascites. Request made for diagnostic and therapeutic paracentesis. EXAM: ULTRASOUND GUIDED DIAGNOSTIC AND THERAPEUTIC PARACENTESIS MEDICATIONS: None. COMPLICATIONS: None immediate. PROCEDURE: Informed written consent was obtained from the patient after a discussion of the risks, benefits and alternatives to treatment. A timeout was performed prior to the initiation of the procedure. Initial ultrasound scanning demonstrates a large amount of ascites within the right lower abdominal quadrant. The right lower abdomen was prepped and draped in the usual sterile fashion. 1% lidocaine was used for local anesthesia. Following this, a Yueh catheter was introduced. An ultrasound image was saved for documentation purposes. The paracentesis was performed. The catheter was removed and a dressing was applied. The patient tolerated the procedure well without immediate post procedural complication. FINDINGS: A total of approximately 4.2 liters of yellow fluid was removed. Samples were sent to the laboratory as requested by the clinical team. IMPRESSION: Successful ultrasound-guided  diagnostic and therapeutic paracentesis yielding 4.2 liters liters of peritoneal fluid. Read by: Rowe Robert, PA-C Electronically Signed   By: Aletta Edouard M.D.   On: 12/18/2015 13:13   Dg Abd 2 Views  12/10/2015  CLINICAL DATA:  Metastatic pancreatic cancer. Vomiting and abdominal pain. Rule out bowel obstruction. EXAM: ABDOMEN - 2 VIEW COMPARISON:  CT abdomen 11/06/2015 FINDINGS: Common bile duct metal stent in satisfactory position. Moderate stool in the colon. Air-fluid level in the hepatic flexure and transverse colon. Small air-fluid levels in the small bowel without dilatation. No free air on the upright view. Negative for urinary tract calculi.  No focal bony lesion. IMPRESSION: Air-fluid levels in large and small bowel without bowel dilatation. Probable mild ileus. Electronically Signed   By: Franchot Gallo M.D.   On: 12/10/2015 11:55   Dg Abd Acute W/chest  12/17/2015  CLINICAL DATA:  Back pain and right side pain since chemotherapy 2 weeks ago. Vomiting. EXAM: DG ABDOMEN ACUTE W/ 1V  CHEST COMPARISON:  Chest CT 11/06/2015 FINDINGS: Right Port-A-Cath is in place with the tip in the right atrium. Linear densities at the left base, likely scarring or atelectasis. No effusions. Heart is normal size. Nonobstructive bowel gas pattern. Moderate stool in the colon. Metallic biliary stent in place. No organomegaly or free air. IMPRESSION: Moderate stool burden. No evidence of bowel obstruction or free air. Left basilar atelectasis or scarring.  No active disease. Electronically Signed   By: Rolm Baptise M.D.   On: 12/17/2015 13:22    Labs:  CBC:  Recent Labs  12/17/15 1441 12/18/15 0502 12/19/15 0441 12/20/15 0502  WBC 3.8* 3.3* 4.1 4.2  HGB 12.5 10.2* 10.4* 8.8*  HCT 38.5 31.9* 32.6* 27.3*  PLT 372 430* 449* 387    COAGS:  Recent Labs  04/20/15 0706 12/17/15 1715  INR 1.16 1.79*  APTT  --  104*    BMP:  Recent Labs  04/21/15 0530 04/22/15 0425  10/09/15 0850  10/30/15 0822  12/04/15 0813 12/10/15 1150 12/17/15 1441 12/18/15 0502  NA 135 135  < > 137 141  < > 136 136 135 134*  K 3.7 3.7  < > 3.2* 3.1*  < > 3.4* 3.6 3.4* 3.5  CL 105 105  < > 104 109*  --   --   --  99* 105  CO2 16* 21*  < > 22 25  < > 19* 22 18* 18*  GLUCOSE 140* 262*  < > 145* 75  < > 108 156* 113* 132*  BUN 5* 13  < > 14 12  < > 6.4* 13.3 19 15   CALCIUM 8.9 9.3  < > 9.4 9.8  < > 9.4 8.6 8.7* 7.8*  CREATININE 0.41* 0.65  < > 0.7 0.8  < > 0.7 0.6 0.62 0.57  GFRNONAA >60 >60  --   --   --   --   --   --  >60 >60  GFRAA >60 >60  --   --   --   --   --   --  >60 >60  < > = values in this interval not displayed.  LIVER FUNCTION TESTS:  Recent Labs  12/02/15 1119 12/04/15 0813 12/10/15 1150 12/17/15 1441  BILITOT 0.41 0.39 0.58 0.8  AST 33 27 54* 34  ALT 32 26 66* 33  ALKPHOS 260* 210* 291* 302*  PROT 7.5 7.1 6.4 6.9  ALBUMIN 2.7* 2.5* 2.5* 2.9*    TUMOR MARKERS:  Recent Labs  09/10/15 0836 10/09/15 0850 11/14/15 0906 12/19/15 1003  CA199 9285.6* 6273.7* 7419.2* 6611*    Assessment and Plan: Metastatic pancreatic cancer Malignant ascites AT this point, we don't know how quickly she is or will be re-accummulating ascites as she has only had one para on 2/15 and currently does not feel distended. Her Xarelto will need to be held for 1 day prior to procedure. I discussed with attending MD, Dr. Charlies Silvers, the pt will likely remain an inpt for the next few days as she may need PRBC transfusion. We can tentatively plan for catheter placement on Mon, therefore holding Xarelto on Sunday. However, if she does not have sufficient ascites on Monday, procedure will have to be deferred and at that point could then be scheduled to be done as an outpt. I have discussed the procedure including risks, complications, need for sedation with the pt.  Thank you for this interesting consult.  I greatly enjoyed meeting Jacqueline Hahn and look forward to  participating in their care.  A  copy of this report was sent to the requesting provider on this date.  Electronically Signed: Ascencion Dike 12/20/2015, 2:36 PM   I spent a total of 30 minutes in face to face in clinical consultation, greater than 50% of which was counseling/coordinating care for malignant ascites and possible drain placement

## 2015-12-20 NOTE — Progress Notes (Signed)
Hypoglycemic Event  CBG: 69  Treatment: 15 GM carbohydrate snack  Symptoms: None   Follow-up CBG: [id number] CBG Result:84  Possible Reasons for Event: Unknown  Comments/MD notified:    Erma Heritage

## 2015-12-20 NOTE — Consult Note (Signed)
Consultation Note Date: 12/20/2015   Patient Name: Jacqueline Hahn  DOB: 21-Mar-1946  MRN: [id number]  Age / Sex: 70 y.o., female  PCP: Midge Minium, MD Referring Physician: Robbie Lis, MD  Reason for Consultation: Establishing goals of care and Hospice Evaluation  Clinical Assessment/Narrative:  70 y.o. female with advanced pancreatic cancer (diagnosed 07/2014) being treated by Dr. Marin Olp who presented to [facility] ED with worsening abdominal pain and distention for past few days prior to admission. CT abd significant for ascites and left femoral vein DVT. She underwent paracentesis with 4.2 L fluid removed. She was started on heparin drip for anticoagulation that was changed to xarelto 2/16.  She has spoken with Dr. Marin Olp who has recommended consideration for hospice as she is not likely to benefit from further disease modifying therapy.  Met with patient in her room. She reports the most important things are living as long as possible as well as possible and being at home with her son.  Contacts/Participants in Discussion: Primary Decision Maker: Patient  Relationship to Patient patient HCPOA: None on chart  SUMMARY OF RECOMMENDATIONS - DNR/DNI - She is open to the idea of going home with hospice support. We had a long discussion about the benefits of this in light of the fact that further disease modifying therapy will not help her live longer or better. She reports that her son lives with her and will be able to be there 24 hours per day to provide care. - Care management consult placed to present with options for home hospice  Code Status/Advance Care Planning: DNR    Code Status Orders        Start     Ordered   12/20/15 0728  Do not attempt resuscitation (DNR)   Continuous    Question Answer Comment  In the event of cardiac or respiratory ARREST Do not call a "code blue"   In the event of cardiac or  respiratory ARREST Do not perform Intubation, CPR, defibrillation or ACLS   In the event of cardiac or respiratory ARREST Use medication by any route, position, wound care, and other measures to relive pain and suffering. May use oxygen, suction and manual treatment of airway obstruction as needed for comfort.      12/20/15 3846    Code Status History    Date Active Date Inactive Code Status Order ID Comments User Context   12/17/2015  7:57 PM 12/20/2015  7:33 AM Full Code [id number]  Theressa Millard, MD ED   04/19/2015  5:29 PM 04/22/2015  4:26 PM Full Code [id number]  [name], MD Inpatient   07/16/2014  3:07 PM 07/17/2014  3:38 AM Full Code [id number]  Corrie Mckusick, DO HOV   06/22/2014  4:39 PM 06/25/2014  6:10 PM Full Code [id number]  Donne Hazel, MD ED      Symptom Management:  Abdominal fullness: Plan for evaluation for peritoneal drain placement  Palliative Prophylaxis:   Aspiration, Bowel Regimen and Frequent Pain Assessment   Psycho-social/Spiritual:  Support System: [place] Desire for further Chaplaincy support: None address Additional Recommendations: Education on Hospice  Prognosis: Less than 6 months  Discharge Planning: Home with Hospice   Chief Complaint/ Primary Diagnoses: Present on Admission:  . DVT (deep venous thrombosis), left . Pancreatic cancer metastasized to lung ([place]) . AKI (acute kidney injury) ([place]) . Hypokalemia  I have reviewed the medical record, interviewed the patient and family, and examined the patient. The following aspects are pertinent.  Past Medical History  Diagnosis Date  . Hypertension   . UTI (urinary tract infection)   . History of stomach ulcers   . Diabetes mellitus without complication ([place])   . Complication of anesthesia     hx of n/v  . PONV (postoperative nausea and vomiting)   . Pancreatic cancer metastasized to lung [facility]) 07/10/2014   Social History   Social History  . Marital Status: Single    Spouse Name: N/A    . Number of Children: N/A  . Years of Education: N/A   Social History Main Topics  . Smoking status: Former Smoker -- 1.00 packs/day for 4 years    Types: Cigarettes    Start date: 02/03/1963    Quit date: 03/05/1967  . Smokeless tobacco: Never Used     Comment: quit 45 years ago  . Alcohol Use: No  . Drug Use: No  . Sexual Activity: Not Asked   Other Topics Concern  . None   Social History Narrative   Family History  Problem Relation Age of Onset  . Heart attack Mother   . Stroke Mother   . Heart attack Father   . Hyperlipidemia Sister   . Hypertension Sister   . Hypertension Brother   . Alcohol abuse Brother    Scheduled Meds: . atenolol  50 mg Oral Daily  . docusate sodium  100 mg Oral BID  . feeding supplement (ENSURE ENLIVE)  237 mL Oral BID BM  . fluconazole  100 mg Oral Daily  . insulin aspart  0-9 Units Subcutaneous 6 times per day  . lipase/protease/amylase  36,000 Units Oral TID WC  . metoprolol  2.5 mg Intravenous 4 times per day  . Rivaroxaban  15 mg Oral BID WC  . sodium chloride flush  3 mL Intravenous Q12H   Continuous Infusions: . sodium chloride 30 mL/hr at 12/19/15 1937   PRN Meds:.acetaminophen **OR** acetaminophen, alum & mag hydroxide-simeth, HYDROmorphone (DILAUDID) injection, iohexol, Linaclotide, LORazepam, ondansetron **OR** ondansetron (ZOFRAN) IV, sodium chloride flush, traMADol Medications Prior to Admission:  Prior to Admission medications   Medication Sig Start Date End Date Taking? Authorizing Provider  amLODipine (NORVASC) 10 MG tablet Take 1 tablet (10 mg total) by mouth daily. 90 day supply 11/20/15  Yes [name], MD  atenolol (TENORMIN) 50 MG tablet Take 1 tablet (50 mg total) by mouth daily. 12/04/15  Yes [name], MD  cyproheptadine (PERIACTIN) 4 MG tablet Take 0.5 tablets (2 mg total) by mouth 3 (three) times daily as needed for allergies. 10/30/15  Yes Eliezer Bottom, NP  dexamethasone (DECADRON) 4 MG tablet  Take 2 tablets (8 mg total) by mouth 2 (two) times daily with a meal. Start the day after chemotherapy for 3 days. Take with food. 12/04/15  Yes [name], MD  glipiZIDE-metformin (METAGLIP) 5-500 MG tablet Take 1 tablet by mouth 2 (two) times daily before a meal. 90 day supply. 11/20/15  Yes [name], MD  l-methylfolate-B6-B12 (METANX) 3-35-2 MG TABS Take 1 tablet by mouth daily. 04/22/15  Yes [name], MD  lidocaine-prilocaine (EMLA) cream Apply 1 application topically as needed. Apply quarter sized amount to portacath site at least one hour prior to chemo treatment 07/10/14  Yes [name], MD  LINZESS 145 MCG CAPS capsule Take 145 mcg by mouth daily as needed (constipation). Reported on 10/30/2015 08/15/15  Yes Historical Provider, MD  lipase/protease/amylase (CREON) 36000 UNITS CPEP capsule Take 1 capsule (36,000 Units  total) by mouth 3 (three) times daily with meals. 11/14/15  Yes Eliezer Bottom, NP  megestrol (MEGACE) 400 MG/10ML suspension Take 800 mg by mouth daily as needed (only uses when stomach is upset or feels stomach "needs" it).    Yes Historical Provider, MD  Multiple Vitamin (MULTIVITAMIN WITH MINERALS) TABS tablet Take 1 tablet by mouth daily. Reported on 10/30/2015   Yes Historical Provider, MD  ondansetron (ZOFRAN) 8 MG tablet Take 1 tablet (8 mg total) by mouth 2 (two) times daily. Start the day after chemo for 3 days. Then take as needed for nausea or vomiting. 12/05/15  Yes [name], MD  potassium chloride SA (K-DUR,KLOR-CON) 20 MEQ tablet Take 2 tablets (40 mEq total) by mouth 2 (two) times daily. 90 day supply 11/20/15  Yes [name], MD  prochlorperazine (COMPAZINE) 10 MG tablet Take 1 tablet (10 mg total) by mouth every 6 (six) hours as needed (Nausea or vomiting). 12/05/15  Yes [name], MD  traMADol (ULTRAM) 50 MG tablet Take 1-2 tablets (50-100 mg total) by mouth every 8 (eight) hours as needed for moderate pain. 10/15/15  Yes [name], MD  amoxicillin-clavulanate (AUGMENTIN) 875-125 MG tablet Take 1 tablet by mouth 2 (two) times daily. Patient not taking: Reported on 12/17/2015 12/02/15   Eliezer Bottom, NP  lactulose (CHRONULAC) 10 GM/15ML solution Take 30 mLs (20 g total) by mouth 3 (three) times daily. Patient not taking: Reported on 12/17/2015 07/04/15   [name], MD  loperamide (IMODIUM A-D) 2 MG tablet Take 2 at onset of diarrhea, then 1 every 2hrs until 12hr without a BM. May take 2 tab every 4hrs at bedtime. If diarrhea recurs repeat. Patient not taking: Reported on 12/17/2015 12/04/15   [name], MD  LORazepam (ATIVAN) 0.5 MG tablet Take 1 tablet (0.5 mg total) by mouth every 6 (six) hours as needed for anxiety. Patient not taking: Reported on 12/17/2015 12/04/15   [name], MD  metoCLOPramide (REGLAN) 10 MG tablet Take 1 tablet (10 mg total) by mouth 4 (four) times daily. Patient not taking: Reported on 12/17/2015 12/10/15   [name], MD  naloxegol oxalate (MOVANTIK) 25 MG TABS tablet Take 1 tablet (25 mg total) by mouth daily. Patient not taking: Reported on 12/17/2015 07/01/15   [name], MD   No Known Allergies  Review of Systems  Constitutional: Positive for activity change, appetite change and fatigue.  Gastrointestinal: Positive for nausea, abdominal pain and abdominal distention. Negative for vomiting.  Musculoskeletal: Positive for back pain.  All other systems reviewed and are negative.   Physical Exam  General: Alert, awake, in no acute distress.  HEENT: No bruits, mucous membranes moist Heart: Regular rate and rhythm. No murmur appreciated. Lungs: Good air movement, clear Abdomen: Soft, nontender, distended, positive bowel sounds.  Ext: + edema Skin: Warm and dry Neuro: Grossly intact, nonfocal.  Vital Signs: BP 146/77 mmHg  Pulse 89  Temp(Src) 97.9 F (36.6 C) (Oral)  Resp 18  Ht 5' (1.524 m)  Wt 50.032 kg (110 lb 4.8 oz)  BMI 21.54 kg/m2  SpO2  100%  SpO2: SpO2: 100 % O2 Device:SpO2: 100 % O2 Flow Rate: .   IO: Intake/output summary:  Intake/Output Summary (Last 24 hours) at 12/20/15 1845 Last data filed at 12/20/15 1735  Gross per 24 hour  Intake 2419.92 ml  Output      0 ml  Net 2419.92 ml  LBM: Last BM Date: 12/20/15 Baseline Weight: Weight: 53.343 kg (117 lb 9.6 oz) Most recent weight: Weight: 50.032 kg (110 lb 4.8 oz)      Palliative Assessment/Data:  Flowsheet Rows        Most Recent Value   Intake Tab    Referral Department  Oncology   Unit at Time of Referral  Cardiac/Telemetry Unit   Palliative Care Primary Diagnosis  Cancer   Date Notified  12/20/15   Palliative Care Type  New Palliative care   Reason for referral  Counsel Regarding Hospice, Clarify Goals of Care   Date of Admission  12/17/15   Date first seen by Palliative Care  12/20/15   # of days Palliative referral response time  0 Day(s)   # of days IP prior to Palliative referral  3   Clinical Assessment    Palliative Performance Scale Score  50%   Pain Max last 24 hours  8   Pain Min Last 24 hours  2   Psychosocial & Spiritual Assessment    Palliative Care Outcomes    Patient/Family meeting held?  Yes   Who was at the meeting?  Patient   Palliative Care Outcomes  Clarified goals of care, Counseled regarding hospice, Transitioned to hospice      Additional Data Reviewed:  CBC:    Component Value Date/Time   WBC 4.2 12/20/2015 0502   WBC 8.3 12/10/2015 1330   HGB 8.8* 12/20/2015 0502   HGB 10.7* 12/10/2015 1330   HCT 27.3* 12/20/2015 0502   HCT 32.7* 12/10/2015 1330   PLT 387 12/20/2015 0502   PLT 256 12/10/2015 1330   MCV 72.0* 12/20/2015 0502   MCV 72* 12/10/2015 1330   NEUTROABS 1.5* 12/17/2015 1441   NEUTROABS 7.2* 12/10/2015 1330   LYMPHSABS 0.9 12/17/2015 1441   LYMPHSABS 0.8* 12/10/2015 1330   MONOABS 1.4* 12/17/2015 1441   EOSABS 0.0 12/17/2015 1441   EOSABS 0.0 12/10/2015 1330   BASOSABS 0.0 12/17/2015 1441    BASOSABS 0.0 12/10/2015 1330   Comprehensive Metabolic Panel:    Component Value Date/Time   NA 134* 12/18/2015 0502   NA 136 12/10/2015 1150   NA 141 10/30/2015 0822   K 3.5 12/18/2015 0502   K 3.6 12/10/2015 1150   K 3.1* 10/30/2015 0822   CL 105 12/18/2015 0502   CL 109* 10/30/2015 0822   CO2 18* 12/18/2015 0502   CO2 22 12/10/2015 1150   CO2 25 10/30/2015 0822   BUN 15 12/18/2015 0502   BUN 13.3 12/10/2015 1150   BUN 12 10/30/2015 0822   CREATININE 0.57 12/18/2015 0502   CREATININE 0.6 12/10/2015 1150   CREATININE 0.8 10/30/2015 0822   GLUCOSE 132* 12/18/2015 0502   GLUCOSE 156* 12/10/2015 1150   GLUCOSE 75 10/30/2015 0822   CALCIUM 7.8* 12/18/2015 0502   CALCIUM 8.6 12/10/2015 1150   CALCIUM 9.8 10/30/2015 0822   AST 34 12/17/2015 1441   AST 54* 12/10/2015 1150   AST 67* 10/30/2015 0822   ALT 33 12/17/2015 1441   ALT 66* 12/10/2015 1150   ALT 90* 10/30/2015 0822   ALKPHOS 302* 12/17/2015 1441   ALKPHOS 291* 12/10/2015 1150   ALKPHOS 227* 10/30/2015 0822   BILITOT 0.8 12/17/2015 1441   BILITOT 0.58 12/10/2015 1150   BILITOT 0.50 10/30/2015 0822   PROT 6.9 12/17/2015 1441   PROT 6.4 12/10/2015 1150   PROT 7.3 10/30/2015 0822   ALBUMIN 2.9* 12/17/2015 1441   ALBUMIN 2.5* 12/10/2015 1150  ALBUMIN 3.1* 10/30/2015 2840     Time In: 0855 Time Out: 0955 Time Total: 60 Greater than 50%  of this time was spent counseling and coordinating care related to the above assessment and plan.  Signed by: Micheline Rough, MD  Micheline Rough, MD  12/20/2015, 6:45 PM  Please contact Palliative Medicine Team phone at [phone] for questions and concerns.

## 2015-12-20 NOTE — Progress Notes (Signed)
Patient ID: Jacqueline Hahn, female   DOB: July 06, 1946, 70 y.o.   MRN: [id number] [facility] PROGRESS NOTE  Jacqueline Hahn [id number] DOB: Aug 20, 1946 DOA: 12/17/2015 PCP: Annye Asa, MD  Brief narrative:    70 y.o. female with advanced pancreatic cancer (diagnosed 07/2014) receiving chemo Rx who presented to [facility] ED with complaints of worsening abdominal pain and distention over past few days prior to this admission. She had CT abd which was significant for ascites and left femoral vein DVT. She underwent paracentesis with 4.2 L fluid removed. She was started on heparin drip for anticoagulation. Changed to xarelto 2/16.  Assessment/Plan:    Principal Problem:   DVT (deep venous thrombosis), left - In the setting of advanced pancreatic cancer - Heparin drip stopped 2/16 - Started xarelto 2/16 - No bleeding   Active Problems:   Abdominal distention secondary to malignant ascites in the setting of pancreatic cancer / Nausea and vomiting  - Underwent paracentesis 2/15 with 4.2 L fluids removed  - Fluid cytology adenomacarcinoma  - Diet as tolerated    Pancreatic cancer metastasized to lung [facility]) - Appreciate oncology following - Continue pancreatic lipase supplements     Essential hypertension - Continue atenolol and metoprolol  - Blood pressure stable    Hypokalemia - Secondary to GI losses. Supplemented    Diabetes mellitus without complication without long term insulin use - Continue sliding scale insulin - CBG's: 69, 84, 149    Malnutrition of moderate degree - In the setting of chronic illness - Nutrition consulted    Anemia of chronic disease - Secondary to history of malignancy - Hemoglobin down from 10.4 to 8.8 - Will give 1 U pRBC transfusion per oncology   DVT Prophylaxis  - Started xarelto 2/16 - No bleeding   Code Status: DNR/DNI Family Communication:  plan of care discussed with the patient Disposition Plan: Hopefully by 2/18  IV access:  Peripheral  IV  Procedures and diagnostic studies:    Ct Abdomen Pelvis W Contrast 12/17/2015  Significant ascites with omental infiltration as well as probable tumor adjacent to the hepatic flexure and gallbladder consistent with peritoneal carcinomatosis. Filling defect in LEFT common femoral vein consistent with deep venous thrombosis. Cannot exclude wall thickening at the gastric antrum/pylorus; this could be related to ulcer disease/gastritis, radiation therapy, or tumor. Two questionable tiny LEFT lower lobe pulmonary nodules.   US Paracentesis 12/18/2015 Successful ultrasound-guided diagnostic and therapeutic paracentesis yielding 4.2 liters liters of peritoneal fluid.   Dg Abd Acute W/chest 12/17/2015  Moderate stool burden. No evidence of bowel obstruction or free air. Left basilar atelectasis or scarring.  No active disease.   Medical Consultants:  IR  IAnti-Infectives:   None    Leisa Lenz, MD  [facility] Pager [phone]  Time spent in minutes: 25 minutes  If 7PM-7AM, please contact night-coverage [email or web address] Password [facility] 12/20/2015, 12:15 PM   LOS: 3 days    HPI/Subjective: No acute overnight events. Patient ate okay.  Objective: Filed Vitals:   12/20/15 0026 12/20/15 0408 12/20/15 0626 12/20/15 1157  BP: 120/61 125/62 129/69 137/69  Pulse: 90 94 99 95  Temp: 97.6 F (36.4 C) 98.1 F (36.7 C)    TempSrc: Oral Oral    Resp: 18 18    Height:      Weight:  50.032 kg (110 lb 4.8 oz)    SpO2: 100% 97%      Intake/Output Summary (Last 24 hours) at 12/20/15 1215 Last data filed at 12/20/15 0900  Gross per 24 hour  Intake 2099.92 ml  Output      0 ml  Net 2099.92 ml    Exam:   General:  Pt is alert. Not in distress   Cardiovascular: Rate controlled, appreciate  S1, S2  Respiratory: No rhonchi, no wheezing   Abdomen:  Bowel sounds appreciated, non tender, distended   Extremities: No leg edema, palpable pulses   Neuro: No focal deficits   Data  Reviewed: Basic Metabolic Panel:  Recent Labs Lab 12/17/15 1441 12/18/15 0502  NA 135 134*  K 3.4* 3.5  CL 99* 105  CO2 18* 18*  GLUCOSE 113* 132*  BUN 19 15  CREATININE 0.62 0.57  CALCIUM 8.7* 7.8*  MG 1.6*  --    Liver Function Tests:  Recent Labs Lab 12/17/15 1441  AST 34  ALT 33  ALKPHOS 302*  BILITOT 0.8  PROT 6.9  ALBUMIN 2.9*    Recent Labs Lab 12/17/15 1441  LIPASE 12   No results for input(s): AMMONIA in the last 168 hours. CBC:  Recent Labs Lab 12/17/15 1441 12/18/15 0502 12/19/15 0441 12/20/15 0502  WBC 3.8* 3.3* 4.1 4.2  NEUTROABS 1.5*  --   --   --   HGB 12.5 10.2* 10.4* 8.8*  HCT 38.5 31.9* 32.6* 27.3*  MCV 72.4* 72.7* 72.9* 72.0*  PLT 372 430* 449* 387   Cardiac Enzymes: No results for input(s): CKTOTAL, CKMB, CKMBINDEX, TROPONINI in the last 168 hours. BNP: Invalid input(s): POCBNP CBG:  Recent Labs Lab 12/19/15 2007 12/20/15 0023 12/20/15 0405 12/20/15 0741 12/20/15 0811  GLUCAP 112* 111* 91 69 84    Recent Results (from the past 240 hour(s))  Urine culture     Status: None   Collection Time: 12/17/15 12:45 PM  Result Value Ref Range Status   Specimen Description URINE, CLEAN CATCH  Final   Special Requests NONE  Final   Culture   Final    NO GROWTH 1 DAY Performed at [facility]    Report Status 12/18/2015 FINAL  Final  Culture, blood (routine x 2)     Status: None (Preliminary result)   Collection Time: 12/17/15  2:41 PM  Result Value Ref Range Status   Specimen Description BLOOD LEFT HAND  Final   Special Requests IN PEDIATRIC BOTTLE [place]  Final   Culture   Final    NO GROWTH 2 DAYS Performed at [facility]    Report Status PENDING  Incomplete  Culture, blood (routine x 2)     Status: None (Preliminary result)   Collection Time: 12/17/15 11:24 PM  Result Value Ref Range Status   Specimen Description BLOOD LEFT HAND  Final   Special Requests IN PEDIATRIC BOTTLE 3CC  Final   Culture   Final     NO GROWTH 1 DAY Performed at [facility]    Report Status PENDING  Incomplete  Culture, body fluid-bottle     Status: None (Preliminary result)   Collection Time: 12/18/15  1:19 PM  Result Value Ref Range Status   Specimen Description FLUID PERITONEAL  Final   Special Requests NONE  Final   Culture   Final    NO GROWTH 1 DAY Performed at [facility]    Report Status PENDING  Incomplete  Gram stain     Status: None   Collection Time: 12/18/15  1:19 PM  Result Value Ref Range Status   Specimen Description FLUID PERITONEAL  Final   Special  Requests NONE  Final   Gram Stain   Final    FEW WBC PRESENT,BOTH PMN AND MONONUCLEAR NO ORGANISMS SEEN Performed at [facility]    Report Status 12/18/2015 FINAL  Final     Scheduled Meds: . sodium chloride   Intravenous Once  . atenolol  50 mg Oral Daily  . docusate sodium  100 mg Oral BID  . feeding supplement (ENSURE ENLIVE)  237 mL Oral BID BM  . fluconazole  100 mg Oral Daily  . insulin aspart  0-9 Units Subcutaneous 6 times per day  . lipase/protease/amylase  36,000 Units Oral TID WC  . metoprolol  2.5 mg Intravenous 4 times per day  . Rivaroxaban  15 mg Oral BID WC  . sodium chloride flush  3 mL Intravenous Q12H   Continuous Infusions: . sodium chloride 30 mL/hr at 12/19/15 [id number]

## 2015-12-20 NOTE — Care Management Important Message (Signed)
Important Message  Patient Details  Name: Jacqueline Hahn MRN: [id number] Date of Birth: 1946-07-10   Medicare Important Message Given:  Yes    Camillo Flaming 12/20/2015, 12:55 [place] Message  Patient Details  Name: Jacqueline Hahn MRN: [id number] Date of Birth: [birth year]   Medicare Important Message Given:  Yes    Camillo Flaming 12/20/2015, 12:55 PM

## 2015-12-20 NOTE — Progress Notes (Signed)
I had a long talk with Jacqueline Hahn this morning. The cytology on her peritoneal fluid came back malignant. I think she clearly is showing [place] that she has progressive disease that, in essence, is refractory to therapy.  I told her that I would not recommend any further treatment for her. I think any treatment that we offer her would have less than a 10% chance of success.  I do think that her ascites is recurrent. I believe that she will need a peritoneal catheter to help with ascites removal. I will see if interventional radiology can do this.  We did do a Doppler of her legs. She does have some DVT in her legs. She will be on Xarelto.  Her appetite is a little bit better. She still has some nausea.  I talked her about end-of-life issues. I talked her about life support measures and her wishes to be kept alive on life support. She DOES NOT want to be placed on life support. She does not want to do this to her family area and I totally agree with this. As such, she is now a DO NOT RESUSCITATE.  We talked about hospice. I believe hospice will definitely benefit her. I explained to her what hospice was all about. I explained hospice can do for her. We will see by getting palliative care involved while she is in the hospital. I am sure that they can make the transition.  Her hemoglobin is only 8.8. I do think that one unit of blood will help her out. I spoke to her about this. I sling to her about blood transfusions. I told her that the blood was safe. She recently transfusion.  Her blood sugars are on the low side. I think this is also evidence of her disease progressing.   On her physical exam, she is petite. She has had some muscle atrophy in the upper and lower extremities. Her vital signs are fairly stable. Her abdomen does show some increased distention. She has a fluid wave. There is no abdominal mass. There is no hepatomegaly. Lungs are clear. Cardiac exam regular rate and rhythm with no murmurs,  rubs or bruits. Extremities does not show any swelling. There is no venous cord.  I think we now had to transition Jacqueline Hahn over to comfort care and quality of life. She understands why. This is really not much of a surprise to her. She does not want to "burden" her family. I appreciate her concern for them.  I very much appreciate the house any care that she is getting from all the staff up on [address].  Hopefully, she will be oh to go home soon. I really think that she has to get this peritoneal catheter placed. I think a blood transfusion will help her.  Her faith remains very strong.  Lum Keas

## 2015-12-21 DIAGNOSIS — I10 Essential (primary) hypertension: Secondary | ICD-10-CM

## 2015-12-21 LAB — CBC
HEMATOCRIT: 37.2 % (ref 36.0–46.0)
Hemoglobin: 11.9 g/dL — ABNORMAL LOW (ref 12.0–15.0)
MCH: 24 pg — AB (ref 26.0–34.0)
MCHC: 32 g/dL (ref 30.0–36.0)
MCV: 75 fL — ABNORMAL LOW (ref 78.0–100.0)
Platelets: 520 10*3/uL — ABNORMAL HIGH (ref 150–400)
RBC: 4.96 MIL/uL (ref 3.87–5.11)
RDW: 17.8 % — AB (ref 11.5–15.5)
WBC: 6.1 10*3/uL (ref 4.0–10.5)

## 2015-12-21 LAB — GLUCOSE, CAPILLARY
GLUCOSE-CAPILLARY: 105 mg/dL — AB (ref 65–99)
GLUCOSE-CAPILLARY: 125 mg/dL — AB (ref 65–99)
GLUCOSE-CAPILLARY: 67 mg/dL (ref 65–99)
GLUCOSE-CAPILLARY: 72 mg/dL (ref 65–99)
GLUCOSE-CAPILLARY: 86 mg/dL (ref 65–99)
GLUCOSE-CAPILLARY: 98 mg/dL (ref 65–99)
Glucose-Capillary: 84 mg/dL (ref 65–99)
Glucose-Capillary: 120 mg/dL — ABNORMAL HIGH (ref 65–99)

## 2015-12-21 MED ORDER — VITAMIN B-12 100 MCG PO TABS
100.0000 ug | ORAL_TABLET | Freq: Every day | ORAL | Status: DC
  Administered 2015-12-21 – 2015-12-22 (×2): 100 ug via ORAL
  Filled 2015-12-21 (×4): qty 1

## 2015-12-21 MED ORDER — ADULT MULTIVITAMIN W/MINERALS CH
1.0000 | ORAL_TABLET | Freq: Every day | ORAL | Status: DC
  Administered 2015-12-21 – 2015-12-22 (×2): 1 via ORAL
  Filled 2015-12-21 (×4): qty 1

## 2015-12-21 MED ORDER — VITAMIN B-1 100 MG PO TABS
100.0000 mg | ORAL_TABLET | Freq: Every day | ORAL | Status: DC
  Administered 2015-12-21 – 2015-12-22 (×2): 100 mg via ORAL
  Filled 2015-12-21 (×4): qty 1

## 2015-12-21 MED ORDER — FOLIC ACID 1 MG PO TABS
1.0000 mg | ORAL_TABLET | Freq: Every day | ORAL | Status: DC
  Administered 2015-12-21 – 2015-12-22 (×2): 1 mg via ORAL
  Filled 2015-12-21 (×4): qty 1

## 2015-12-21 NOTE — Progress Notes (Signed)
Patient ID: Jacqueline Hahn, female   DOB: [birth year], 70 y.o.   MRN: [id number] [facility] PROGRESS NOTE  Jacqueline Hahn [id number] DOB: 05-08-1946 DOA: 12/17/2015 PCP: Annye Asa, MD  Brief narrative:    70 y.o. female with advanced pancreatic cancer (diagnosed 07/2014) receiving chemo Rx who presented to [facility] ED with complaints of worsening abdominal pain and distention over past few days prior to this admission. She had CT abd which was significant for ascites and left femoral vein DVT. She underwent paracentesis with 4.2 L fluid removed. She was started on heparin drip for anticoagulation. Changed to xarelto 2/16.  Assessment/Plan:    Principal Problem:   DVT (deep venous thrombosis), left - In the setting of advanced pancreatic cancer - Heparin drip stopped 2/16 - Started xarelto 2/16 - No bleeding  - Needs to be held Sunday for procedure monday  Active Problems:   Abdominal distention secondary to malignant ascites in the setting of pancreatic cancer / Nausea and vomiting  - Underwent paracentesis 2/15 with 4.2 L fluids removed  - Fluid cytology adenomacarcinoma  - Diet as tolerated - Plan for catheter placement Monday     Pancreatic cancer metastasized to lung [facility]) - Appreciate oncology following - Continue pancreatic lipase supplements  - Added folic acid, 123456 , thiamine vitamins per pt request     Essential hypertension - Continue atenolol and metoprolol  - Blood pressure stable    Hypokalemia - Secondary to GI losses.  - Supplemented    Diabetes mellitus without complication without long term insulin use - Continue sliding scale insulin - CBG's: 69, 84, 149    Malnutrition of moderate degree - In the setting of chronic illness - Seen by nutritionist     Anemia of chronic disease - Secondary to history of malignancy - Hemoglobin 11.9 - Has gotten 1 U PRBC 2/17  DVT Prophylaxis  - Started xarelto 2/16 - No bleeding  - Hold xarelto Sunday for  procedure Monday   Code Status: DNR/DNI Family Communication:  plan of care discussed with the patient Disposition Plan: Hopefully by 2/21  IV access:  Peripheral IV  Procedures and diagnostic studies:    Ct Abdomen Pelvis W Contrast 12/17/2015  Significant ascites with omental infiltration as well as probable tumor adjacent to the hepatic flexure and gallbladder consistent with peritoneal carcinomatosis. Filling defect in LEFT common femoral vein consistent with deep venous thrombosis. Cannot exclude wall thickening at the gastric antrum/pylorus; this could be related to ulcer disease/gastritis, radiation therapy, or tumor. Two questionable tiny LEFT lower lobe pulmonary nodules.   US Paracentesis 12/18/2015 Successful ultrasound-guided diagnostic and therapeutic paracentesis yielding 4.2 liters liters of peritoneal fluid.   Dg Abd Acute W/chest 12/17/2015  Moderate stool burden. No evidence of bowel obstruction or free air. Left basilar atelectasis or scarring.  No active disease.   Medical Consultants:  IR  IAnti-Infectives:   None    Leisa Lenz, MD  [facility] Pager [phone]  Time spent in minutes: 25 minutes  If 7PM-7AM, please contact night-coverage [email or web address] Password [email or web address] 12/21/2015, 2:07 PM   LOS: 4 days    HPI/Subjective: No acute overnight events. Patient reports no bleeding.   Objective: Filed Vitals:   12/21/15 0416 12/21/15 1007 12/21/15 1010 12/21/15 1300  BP: 141/69 145/77 145/77 145/71  Pulse: 92 94 94 91  Temp: 98.1 F (36.7 C) 98.2 F (36.8 C)  98.3 F (36.8 C)  TempSrc: Oral Oral  Oral  Resp: 18 17  18   Height:  Weight: 50.259 kg (110 lb 12.8 oz)     SpO2: 99% 98%  99%    Intake/Output Summary (Last 24 hours) at 12/21/15 1407 Last data filed at 12/21/15 1300  Gross per 24 hour  Intake    683 ml  Output      0 ml  Net    683 ml    Exam:   General:  Pt is not in distress   Cardiovascular: RRR, S1, S2  (+)  Respiratory: bilateral air entry, no wheezing   Abdomen:  Softly distended, better since paracentesis, (+) BS  Extremities: No edema, palpable pulses    Neuro: Nonfocal   Data Reviewed: Basic Metabolic Panel:  Recent Labs Lab 12/17/15 1441 12/18/15 0502  NA 135 134*  K 3.4* 3.5  CL 99* 105  CO2 18* 18*  GLUCOSE 113* 132*  BUN 19 15  CREATININE 0.62 0.57  CALCIUM 8.7* 7.8*  MG 1.6*  --    Liver Function Tests:  Recent Labs Lab 12/17/15 1441  AST 34  ALT 33  ALKPHOS 302*  BILITOT 0.8  PROT 6.9  ALBUMIN 2.9*    Recent Labs Lab 12/17/15 1441  LIPASE 12   No results for input(s): AMMONIA in the last 168 hours. CBC:  Recent Labs Lab 12/17/15 1441 12/18/15 0502 12/19/15 0441 12/20/15 0502 12/21/15 0451  WBC 3.8* 3.3* 4.1 4.2 6.1  NEUTROABS 1.5*  --   --   --   --   HGB 12.5 10.2* 10.4* 8.8* 11.9*  HCT 38.5 31.9* 32.6* 27.3* 37.2  MCV 72.4* 72.7* 72.9* 72.0* 75.0*  PLT 372 430* 449* 387 520*   Cardiac Enzymes: No results for input(s): CKTOTAL, CKMB, CKMBINDEX, TROPONINI in the last 168 hours. BNP: Invalid input(s): POCBNP CBG:  Recent Labs Lab 12/20/15 1604 12/20/15 2022 12/21/15 0057 12/21/15 0421 12/21/15 0731  GLUCAP 104* 165* 72 86 98    Recent Results (from the past 240 hour(s))  Urine culture     Status: None   Collection Time: 12/17/15 12:45 PM  Result Value Ref Range Status   Specimen Description URINE, CLEAN CATCH  Final   Special Requests NONE  Final   Culture   Final    NO GROWTH 1 DAY Performed at [facility]    Report Status 12/18/2015 FINAL  Final  Culture, blood (routine x 2)     Status: None (Preliminary result)   Collection Time: 12/17/15  2:41 PM  Result Value Ref Range Status   Specimen Description BLOOD LEFT HAND  Final   Special Requests IN PEDIATRIC BOTTLE [place]  Final   Culture   Final    NO GROWTH 3 DAYS Performed at [facility]    Report Status PENDING  Incomplete  Culture, blood  (routine x 2)     Status: None (Preliminary result)   Collection Time: 12/17/15 11:24 PM  Result Value Ref Range Status   Specimen Description BLOOD LEFT HAND  Final   Special Requests IN PEDIATRIC BOTTLE 3CC  Final   Culture   Final    NO GROWTH 2 DAYS Performed at [facility]    Report Status PENDING  Incomplete  Culture, body fluid-bottle     Status: None (Preliminary result)   Collection Time: 12/18/15  1:19 PM  Result Value Ref Range Status   Specimen Description FLUID PERITONEAL  Final   Special Requests NONE  Final   Culture   Final    NO GROWTH 2 DAYS Performed  at [facility]    Report Status PENDING  Incomplete  Gram stain     Status: None   Collection Time: 12/18/15  1:19 PM  Result Value Ref Range Status   Specimen Description FLUID PERITONEAL  Final   Special Requests NONE  Final   Gram Stain   Final    FEW WBC PRESENT,BOTH PMN AND MONONUCLEAR NO ORGANISMS SEEN Performed at [facility]    Report Status 12/18/2015 FINAL  Final     Scheduled Meds: . atenolol  50 mg Oral Daily  . docusate sodium  100 mg Oral BID  . feeding supplement (ENSURE ENLIVE)  237 mL Oral BID BM  . fluconazole  100 mg Oral Daily  . folic acid  1 mg Oral Daily  . insulin aspart  0-9 Units Subcutaneous 6 times per day  . lipase/protease/amylase  36,000 Units Oral TID WC  . metoprolol  2.5 mg Intravenous 4 times per day  . multivitamin with minerals  1 tablet Oral Daily  . Rivaroxaban  15 mg Oral BID WC  . sodium chloride flush  3 mL Intravenous Q12H  . thiamine  100 mg Oral Daily  . vitamin B-12  100 mcg Oral Daily   Continuous Infusions: . sodium chloride 30 mL/hr at 12/19/15 [id number]

## 2015-12-21 NOTE — Progress Notes (Signed)
Taking over care of pt, agree with previous RN assessment. Pt resting comfortably at this time. Will continue to monitor. 

## 2015-12-22 LAB — CBC
HEMATOCRIT: 34.5 % — AB (ref 36.0–46.0)
Hemoglobin: 11.3 g/dL — ABNORMAL LOW (ref 12.0–15.0)
MCH: 24.1 pg — ABNORMAL LOW (ref 26.0–34.0)
MCHC: 32.8 g/dL (ref 30.0–36.0)
MCV: 73.7 fL — AB (ref 78.0–100.0)
PLATELETS: 505 10*3/uL — AB (ref 150–400)
RBC: 4.68 MIL/uL (ref 3.87–5.11)
RDW: 17.8 % — AB (ref 11.5–15.5)
WBC: 7.6 10*3/uL (ref 4.0–10.5)

## 2015-12-22 LAB — CULTURE, BLOOD (ROUTINE X 2): CULTURE: NO GROWTH

## 2015-12-22 LAB — GLUCOSE, CAPILLARY
GLUCOSE-CAPILLARY: 93 mg/dL (ref 65–99)
GLUCOSE-CAPILLARY: 131 mg/dL — AB (ref 65–99)
GLUCOSE-CAPILLARY: 135 mg/dL — AB (ref 65–99)
Glucose-Capillary: 105 mg/dL — ABNORMAL HIGH (ref 65–99)
Glucose-Capillary: 126 mg/dL — ABNORMAL HIGH (ref 65–99)
Glucose-Capillary: 93 mg/dL (ref 65–99)

## 2015-12-22 MED ORDER — LIP MEDEX EX OINT
TOPICAL_OINTMENT | CUTANEOUS | Status: DC | PRN
  Filled 2015-12-22 (×2): qty 7

## 2015-12-22 MED ORDER — MEGESTROL ACETATE 400 MG/10ML PO SUSP
200.0000 mg | Freq: Two times a day (BID) | ORAL | Status: DC
  Administered 2015-12-22 (×2): 200 mg via ORAL
  Filled 2015-12-22 (×4): qty 5

## 2015-12-22 NOTE — Progress Notes (Signed)
Patient ID: Jacqueline Hahn, female   DOB: [birth year], 70 y.o.   MRN: [id number] [facility] PROGRESS NOTE  Jacqueline Hahn [id number] DOB: Jan 12, 1946 DOA: 12/17/2015 PCP: Annye Asa, MD  Brief narrative:    70 y.o. female with advanced pancreatic cancer (diagnosed 07/2014) receiving chemo Rx who presented to [facility] ED with complaints of worsening abdominal pain and distention over past few days prior to this admission. She had CT abd which was significant for ascites and left femoral vein DVT. She underwent paracentesis with 4.2 L fluid removed. She was started on heparin drip for anticoagulation. Changed to xarelto 2/16.  Hold xarelto tonight and tomorrow am.  Assessment/Plan:    Principal Problem:   DVT (deep venous thrombosis), left - In the setting of advanced pancreatic cancer - Heparin drip stopped 2/16 - Started xarelto 2/16 and no reports of bleeding   Active Problems:   Abdominal distention secondary to malignant ascites in the setting of pancreatic cancer / Nausea and vomiting  - Underwent paracentesis 2/15 with 4.2 L fluids removed  - Fluid cytology adenomacarcinoma  - Diet as tolerated - Plan for catheter placement Monday     Pancreatic cancer metastasized to lung [facility]) - Appreciate oncology following - Continue pancreatic lipase supplements  - Continue folic acid, 123456 , thiamine, MVI    Essential hypertension - Continue atenolol  - Stop meoprolol - Blood pressure stable at 128/76    Hypokalemia - Secondary to GI losses.  - Supplemented - Check BMP in am    Diabetes mellitus without complication without long term insulin use - Continue sliding scale insulin    Malnutrition of moderate degree - In the setting of chronic illness - Seen by nutritionist  - Will add megace today, 200 mg BID and see if any improvement in appetite - She takes nutritional supplementation but says even with that she gets full very quickly     Anemia of chronic disease - Secondary  to history of malignancy - She is s/p 1 U PRBC 2/17 - Hemoglobin is 11.3 - No current indications for transfusion   DVT Prophylaxis  - Started xarelto 2/16 - Hold night dose and tomorrow am dose for abd cath placement   Code Status: DNR/DNI Family Communication:  plan of care discussed with the patient Disposition Plan: Hopefully by 2/21; abd cath placement 2/20  IV access:  Peripheral IV  Procedures and diagnostic studies:    Ct Abdomen Pelvis W Contrast 12/17/2015  Significant ascites with omental infiltration as well as probable tumor adjacent to the hepatic flexure and gallbladder consistent with peritoneal carcinomatosis. Filling defect in LEFT common femoral vein consistent with deep venous thrombosis. Cannot exclude wall thickening at the gastric antrum/pylorus; this could be related to ulcer disease/gastritis, radiation therapy, or tumor. Two questionable tiny LEFT lower lobe pulmonary nodules.   US Paracentesis 12/18/2015 Successful ultrasound-guided diagnostic and therapeutic paracentesis yielding 4.2 liters liters of peritoneal fluid.   Dg Abd Acute W/chest 12/17/2015  Moderate stool burden. No evidence of bowel obstruction or free air. Left basilar atelectasis or scarring.  No active disease.   Medical Consultants:  IR  IAnti-Infectives:   None    Leisa Lenz, MD  [facility] Pager [phone]  Time spent in minutes: 25 minutes  If 7PM-7AM, please contact night-coverage [email or web address] Password [facility] 12/22/2015, 11:35 AM   LOS: 5 days    HPI/Subjective: No acute overnight events. In better spirits this am.  Objective: Filed Vitals:   12/21/15 2133 12/22/15 0141 12/22/15 [id number]  12/22/15 1010  BP: 149/63 121/69 140/68 128/76  Pulse: 87 87 90 87  Temp: 98.2 F (36.8 C) 98.6 F (37 C) 98.1 F (36.7 C)   TempSrc: Oral Oral Oral   Resp: 16 16 16 18   Height:      Weight:   54.7 kg (120 lb 9.5 oz)   SpO2: 99% 99% 98% 100%    Intake/Output Summary (Last  24 hours) at 12/22/15 1135 Last data filed at 12/22/15 0817  Gross per 24 hour  Intake   1180 ml  Output      0 ml  Net   1180 ml    Exam:   General:  Pt is alert, awake, no distress   Cardiovascular: Rate controlled, appreciate S1, S2   Respiratory: no wheezing, no rhonchi   Abdomen:  Distended, non tender, (+) BS  Extremities: No swelling, palpable pulses   Neuro: No focal deficits.   Data Reviewed: Basic Metabolic Panel:  Recent Labs Lab 12/17/15 1441 12/18/15 0502  NA 135 134*  K 3.4* 3.5  CL 99* 105  CO2 18* 18*  GLUCOSE 113* 132*  BUN 19 15  CREATININE 0.62 0.57  CALCIUM 8.7* 7.8*  MG 1.6*  --    Liver Function Tests:  Recent Labs Lab 12/17/15 1441  AST 34  ALT 33  ALKPHOS 302*  BILITOT 0.8  PROT 6.9  ALBUMIN 2.9*    Recent Labs Lab 12/17/15 1441  LIPASE 12   No results for input(s): AMMONIA in the last 168 hours. CBC:  Recent Labs Lab 12/17/15 1441 12/18/15 0502 12/19/15 0441 12/20/15 0502 12/21/15 0451 12/22/15 0454  WBC 3.8* 3.3* 4.1 4.2 6.1 7.6  NEUTROABS 1.5*  --   --   --   --   --   HGB 12.5 10.2* 10.4* 8.8* 11.9* 11.3*  HCT 38.5 31.9* 32.6* 27.3* 37.2 34.5*  MCV 72.4* 72.7* 72.9* 72.0* 75.0* 73.7*  PLT 372 430* 449* 387 520* 505*   Cardiac Enzymes: No results for input(s): CKTOTAL, CKMB, CKMBINDEX, TROPONINI in the last 168 hours. BNP: Invalid input(s): POCBNP CBG:  Recent Labs Lab 12/21/15 1624 12/21/15 1951 12/21/15 2348 12/22/15 0401 12/22/15 0739  GLUCAP 84 120* 105* 105* 93    Recent Results (from the past 240 hour(s))  Urine culture     Status: None   Collection Time: 12/17/15 12:45 PM  Result Value Ref Range Status   Specimen Description URINE, CLEAN CATCH  Final   Special Requests NONE  Final   Culture   Final    NO GROWTH 1 DAY Performed at [facility]    Report Status 12/18/2015 FINAL  Final  Culture, blood (routine x 2)     Status: None (Preliminary result)   Collection Time:  12/17/15  2:41 PM  Result Value Ref Range Status   Specimen Description BLOOD LEFT HAND  Final   Special Requests IN PEDIATRIC BOTTLE [place]  Final   Culture   Final    NO GROWTH 4 DAYS Performed at [facility]    Report Status PENDING  Incomplete  Culture, blood (routine x 2)     Status: None (Preliminary result)   Collection Time: 12/17/15 11:24 PM  Result Value Ref Range Status   Specimen Description BLOOD LEFT HAND  Final   Special Requests IN PEDIATRIC BOTTLE 3CC  Final   Culture   Final    NO GROWTH 3 DAYS Performed at [facility]    Report Status PENDING  Incomplete  Culture, body fluid-bottle     Status: None (Preliminary result)   Collection Time: 12/18/15  1:19 PM  Result Value Ref Range Status   Specimen Description FLUID PERITONEAL  Final   Special Requests NONE  Final   Culture   Final    NO GROWTH 3 DAYS Performed at [facility]    Report Status PENDING  Incomplete  Gram stain     Status: None   Collection Time: 12/18/15  1:19 PM  Result Value Ref Range Status   Specimen Description FLUID PERITONEAL  Final   Special Requests NONE  Final   Gram Stain   Final    FEW WBC PRESENT,BOTH PMN AND MONONUCLEAR NO ORGANISMS SEEN Performed at [facility]    Report Status 12/18/2015 FINAL  Final     Scheduled Meds: . atenolol  50 mg Oral Daily  . docusate sodium  100 mg Oral BID  . feeding supplement (ENSURE ENLIVE)  237 mL Oral BID BM  . fluconazole  100 mg Oral Daily  . folic acid  1 mg Oral Daily  . insulin aspart  0-9 Units Subcutaneous 6 times per day  . lipase/protease/amylase  36,000 Units Oral TID WC  . megestrol  200 mg Oral BID  . multivitamin with minerals  1 tablet Oral Daily  . sodium chloride flush  3 mL Intravenous Q12H  . thiamine  100 mg Oral Daily  . vitamin B-12  100 mcg Oral Daily   Continuous Infusions: . sodium chloride 30 mL/hr at 12/21/15 1500

## 2015-12-23 ENCOUNTER — Inpatient Hospital Stay (HOSPITAL_COMMUNITY): Payer: Commercial Managed Care - HMO

## 2015-12-23 LAB — TYPE AND SCREEN
ABO/RH(D): O POS
ANTIBODY SCREEN: NEGATIVE
Unit division: 0

## 2015-12-23 LAB — BASIC METABOLIC PANEL
Anion gap: 8 (ref 5–15)
BUN: 10 mg/dL (ref 6–20)
CALCIUM: 8.4 mg/dL — AB (ref 8.9–10.3)
CHLORIDE: 107 mmol/L (ref 101–111)
CO2: 21 mmol/L — ABNORMAL LOW (ref 22–32)
CREATININE: 0.56 mg/dL (ref 0.44–1.00)
Glucose, Bld: 110 mg/dL — ABNORMAL HIGH (ref 65–99)
Potassium: 3.7 mmol/L (ref 3.5–5.1)
SODIUM: 136 mmol/L (ref 135–145)

## 2015-12-23 LAB — CBC
HCT: 37.4 % (ref 36.0–46.0)
Hemoglobin: 12.1 g/dL (ref 12.0–15.0)
MCH: 24.2 pg — ABNORMAL LOW (ref 26.0–34.0)
MCHC: 32.4 g/dL (ref 30.0–36.0)
MCV: 74.7 fL — ABNORMAL LOW (ref 78.0–100.0)
PLATELETS: 594 10*3/uL — AB (ref 150–400)
RBC: 5.01 MIL/uL (ref 3.87–5.11)
RDW: 18.5 % — AB (ref 11.5–15.5)
WBC: 9.4 10*3/uL (ref 4.0–10.5)

## 2015-12-23 LAB — GLUCOSE, CAPILLARY
GLUCOSE-CAPILLARY: 106 mg/dL — AB (ref 65–99)
GLUCOSE-CAPILLARY: 98 mg/dL (ref 65–99)
Glucose-Capillary: 84 mg/dL (ref 65–99)
Glucose-Capillary: 101 mg/dL — ABNORMAL HIGH (ref 65–99)
Glucose-Capillary: 162 mg/dL — ABNORMAL HIGH (ref 65–99)

## 2015-12-23 LAB — CULTURE, BODY FLUID W GRAM STAIN -BOTTLE: Culture: NO GROWTH

## 2015-12-23 LAB — CULTURE, BLOOD (ROUTINE X 2): CULTURE: NO GROWTH

## 2015-12-23 LAB — CULTURE, BODY FLUID-BOTTLE

## 2015-12-23 MED ORDER — CEFAZOLIN SODIUM-DEXTROSE 2-3 GM-% IV SOLR
INTRAVENOUS | Status: AC
  Filled 2015-12-23: qty 50

## 2015-12-23 MED ORDER — ONDANSETRON HCL 4 MG/2ML IJ SOLN
INTRAMUSCULAR | Status: AC
  Filled 2015-12-23: qty 2

## 2015-12-23 MED ORDER — RIVAROXABAN 20 MG PO TABS: 20.0000 mg | ORAL_TABLET | Freq: Every day | ORAL | Status: DC

## 2015-12-23 MED ORDER — MIDAZOLAM HCL 2 MG/2ML IJ SOLN
INTRAMUSCULAR | Status: AC | PRN
  Administered 2015-12-23 (×4): 1 mg via INTRAVENOUS

## 2015-12-23 MED ORDER — MIDAZOLAM HCL 2 MG/2ML IJ SOLN
INTRAMUSCULAR | Status: AC
  Filled 2015-12-23: qty 6

## 2015-12-23 MED ORDER — LIDOCAINE-EPINEPHRINE 2 %-1:100000 IJ SOLN
INTRAMUSCULAR | Status: AC
  Filled 2015-12-23: qty 1

## 2015-12-23 MED ORDER — FENTANYL CITRATE (PF) 100 MCG/2ML IJ SOLN
INTRAMUSCULAR | Status: AC
  Filled 2015-12-23: qty 4

## 2015-12-23 MED ORDER — FENTANYL CITRATE (PF) 100 MCG/2ML IJ SOLN
INTRAMUSCULAR | Status: AC | PRN
  Administered 2015-12-23 (×2): 25 ug via INTRAVENOUS
  Administered 2015-12-23: 50 ug via INTRAVENOUS

## 2015-12-23 MED ORDER — RIVAROXABAN 15 MG PO TABS
15.0000 mg | ORAL_TABLET | Freq: Two times a day (BID) | ORAL | Status: DC
  Filled 2015-12-23 (×2): qty 1

## 2015-12-23 MED ORDER — ONDANSETRON HCL 4 MG/2ML IJ SOLN
INTRAMUSCULAR | Status: AC | PRN
  Administered 2015-12-23: 4 mg via INTRAVENOUS

## 2015-12-23 MED ORDER — CEFAZOLIN SODIUM-DEXTROSE 2-3 GM-% IV SOLR
2.0000 g | Freq: Once | INTRAVENOUS | Status: AC
  Administered 2015-12-23: 2 g via INTRAVENOUS

## 2015-12-23 NOTE — Progress Notes (Signed)
Notified by [facility] of family request for Hospice and [place] services at home after discharge. Chart and patient information currently under review to confirm hospice eligibility.   Spoke with patient, at bedside to initiate education related to hospice philosophy, services and team approach to care. Family verbalized understanding of the information provided. Per discussion, plan is for discharge to home by personal vehicle with son and tomorrow.   Please send signed, completed DNR form home with patient.  Patient will need prescriptions for discharge comfort medications.   DME needs discussed and patient denies any needs at this time. [facility] aware of the above.   Completed discharge summary will need to be faxed to [facility] at [phone] when final.  Please notify [facility] when patient is ready to leave unit at discharge-call [phone].  [facility] information and contact numbers have been given to patient during visit.   Please call with any questions.  Thank You,  Freddi Starr RN, [facility] Liaison  [phone]

## 2015-12-23 NOTE — Sedation Documentation (Signed)
Patient denies pain and is resting comfortably.  

## 2015-12-23 NOTE — Progress Notes (Signed)
MEDICATION RELATED CONSULT NOTE   IR Procedure Consult - Anticoagulant/Antiplatelet PTA/Inpatient Med List Review by Pharmacist   Procedure:   Percutaneous tunneled peritoneal catheter Completed: 2/20 Post-Procedural bleeding risk per IR MD assessment:  Standard  Antithrombotic medications on inpatient or PTA profile prior to procedure:  Xarelto     Recommended restart time per IR Post-Procedure Guidelines:   12/24/15 at 1700    Plan: Resume Xarelto 15mg  PO BID tomorrow, 12/24/15, at 1700 through 01/08/16 then start Xarelto 20mg  PO once daily with food thereafter  Ralene Bathe, PharmD, BCPS 12/23/2015, 3:03 PM  Pager: [phone]

## 2015-12-23 NOTE — Procedures (Signed)
Successful placement of a tunneled peritoneal drainage catheter. No immediate complications.  Ronny Bacon, MD Pager #: [phone]

## 2015-12-23 NOTE — Care Management Note (Signed)
Case Management Note  Patient Details  Name: Jacqueline Hahn MRN: [id number] Date of Birth: November 12, 1945  Subjective/Objective:      Admitted with worsening abdominal pain and distention, know pancreatitic cancer              Action/Plan: Disharge planning, spoke with patient at bedside. Patient states she lives at home with her son, remains fairly independent. Discussed recommendations for home hospice, patient very hesitant and states she did not think she needed hospice at this point. Explained role of hospice and that it was her choice to have services. She agreed to speak with representative of [facility] to learn about services and decide if she would want this a d/c. Contacted [facility] for referral.   Expected Discharge Date:   (unknown)               Expected Discharge Plan:  Home w Hospice Care  In-House Referral:  NA  Discharge planning Services  CM Consult  Post Acute Care Choice:  Hospice Choice offered to:  Patient  DME Arranged:  N/A DME Agency:  NA  HH Arranged:  NA HH Agency:  NA  Status of Service:  Completed, signed off  Medicare Important Message Given:  Yes Date Medicare IM Given:    Medicare IM give by:    Date Additional Medicare IM Given:    Additional Medicare Important Message give by:     If discussed at [place] of Stay Meetings, dates discussed:    Additional Comments:  Guadalupe Maple, RN 12/23/2015, 11:06 AM [phone]

## 2015-12-23 NOTE — Progress Notes (Addendum)
Patient ID: Jacqueline Hahn, female   DOB: June 20, 1946, 70 y.o.   MRN: [id number] [facility] PROGRESS NOTE  Jacqueline Hahn [id number] DOB: 1946-10-31 DOA: 12/17/2015 PCP: Annye Asa, MD  Brief narrative:    70 y.o. female with advanced pancreatic cancer (diagnosed 07/2014) receiving chemo Rx who presented to [facility] ED with complaints of worsening abdominal pain and distention over past few days prior to this admission. She had CT abd which was significant for ascites and left femoral vein DVT. She underwent paracentesis with 4.2 L fluid removed. She was started on heparin drip for anticoagulation. Changed to xarelto 2/16.  Plan for peritoneal catheter placement today.   Assessment/Plan:    Principal Problem:   DVT (deep venous thrombosis), left - In the setting of advanced pancreatic cancer - Heparin drip stopped 2/16 - Started xarelto 2/16 and so far no reports of bleeding   Active Problems:   Abdominal distention secondary to malignant ascites in the setting of pancreatic cancer / Nausea and vomiting  - Underwent paracentesis 2/15 with 4.2 L fluids removed  - Fluid cytology adenomacarcinoma  - Diet as tolerated - Plan for catheter placement Monday     Pancreatic cancer metastasized to lung [facility]) - Appreciate oncology following - Continue pancreatic lipase supplements  - Continue folic acid, [id number] , thiamine, MVI    Essential hypertension - Continue atenolol     Hypokalemia - Secondary to GI losses.  - Supplemented    Diabetes mellitus without complication without long term insulin use - Continue sliding scale insulin - CBG's 84 - 98    Malnutrition of moderate degree - In the setting of chronic illness - Seen by nutritionist  - Continue nutritional supplementation  - Stop megace     Anemia of chronic disease - Secondary to history of malignancy - S/p 1 U PRBC 2/17 - Hemoglobin is stable   DVT Prophylaxis  - Started xarelto 2/16 - Holding xarelto for cath  placement today   Code Status: DNR/DNI Family Communication:  plan of care discussed with the patient Disposition Plan: Hopefully by 2/21  IV access:  Peripheral IV  Procedures and diagnostic studies:    Ct Abdomen Pelvis W Contrast 12/17/2015  Significant ascites with omental infiltration as well as probable tumor adjacent to the hepatic flexure and gallbladder consistent with peritoneal carcinomatosis. Filling defect in LEFT common femoral vein consistent with deep venous thrombosis. Cannot exclude wall thickening at the gastric antrum/pylorus; this could be related to ulcer disease/gastritis, radiation therapy, or tumor. Two questionable tiny LEFT lower lobe pulmonary nodules.   US Paracentesis 12/18/2015 Successful ultrasound-guided diagnostic and therapeutic paracentesis yielding 4.2 liters liters of peritoneal fluid.   Dg Abd Acute W/chest 12/17/2015  Moderate stool burden. No evidence of bowel obstruction or free air. Left basilar atelectasis or scarring.  No active disease.   Medical Consultants:  IR  IAnti-Infectives:   None    Leisa Lenz, MD  [facility] Pager [phone]  Time spent in minutes: 25 minutes  If 7PM-7AM, please contact night-coverage [email or web address] Password [facility] 12/23/2015, 8:42 AM   LOS: 6 days    HPI/Subjective: No acute overnight events. Says she has poor appetite.   Objective: Filed Vitals:   12/22/15 1010 12/22/15 1400 12/22/15 2058 12/23/15 0551  BP: 128/76 139/74 142/87 149/76  Pulse: 87 85 83 86  Temp:  98.1 F (36.7 C) 97.6 F (36.4 C) 98 F (36.7 C)  TempSrc:  Oral Oral Oral  Resp: 18 18 18 18   Height:  Weight:    55.3 kg (121 lb 14.6 oz)  SpO2: 100% 100% 99% 100%    Intake/Output Summary (Last 24 hours) at 12/23/15 0842 Last data filed at 12/23/15 0600  Gross per 24 hour  Intake   1320 ml  Output    200 ml  Net   1120 ml    Exam:   General:  Pt is not in distress   Cardiovascular: RRR, S1, S2  (+)  Respiratory: bilateral air entry, no wheezing   Abdomen:  Softly distended, (+) BS  Extremities: No edema, palpable pulses bilaterally   Neuro: Nonfocal   Data Reviewed: Basic Metabolic Panel:  Recent Labs Lab 12/17/15 1441 12/18/15 0502 12/23/15 0459  NA 135 134* 136  K 3.4* 3.5 3.7  CL 99* 105 107  CO2 18* 18* 21*  GLUCOSE 113* 132* 110*  BUN 19 15 10   CREATININE 0.62 0.57 0.56  CALCIUM 8.7* 7.8* 8.4*  MG 1.6*  --   --    Liver Function Tests:  Recent Labs Lab 12/17/15 1441  AST 34  ALT 33  ALKPHOS 302*  BILITOT 0.8  PROT 6.9  ALBUMIN 2.9*    Recent Labs Lab 12/17/15 1441  LIPASE 12   No results for input(s): AMMONIA in the last 168 hours. CBC:  Recent Labs Lab 12/17/15 1441  12/19/15 0441 12/20/15 0502 12/21/15 0451 12/22/15 0454 12/23/15 0459  WBC 3.8*  < > 4.1 4.2 6.1 7.6 9.4  NEUTROABS 1.5*  --   --   --   --   --   --   HGB 12.5  < > 10.4* 8.8* 11.9* 11.3* 12.1  HCT 38.5  < > 32.6* 27.3* 37.2 34.5* 37.4  MCV 72.4*  < > 72.9* 72.0* 75.0* 73.7* 74.7*  PLT 372  < > 449* 387 520* 505* 594*  < > = values in this interval not displayed. Cardiac Enzymes: No results for input(s): CKTOTAL, CKMB, CKMBINDEX, TROPONINI in the last 168 hours. BNP: Invalid input(s): POCBNP CBG:  Recent Labs Lab 12/22/15 1646 12/22/15 2013 12/22/15 2344 12/23/15 0349 12/23/15 0756  GLUCAP 131* 135* 93 98 84    Recent Results (from the past 240 hour(s))  Urine culture     Status: None   Collection Time: 12/17/15 12:45 PM  Result Value Ref Range Status   Specimen Description URINE, CLEAN CATCH  Final   Special Requests NONE  Final   Culture   Final    NO GROWTH 1 DAY Performed at [facility]    Report Status 12/18/2015 FINAL  Final  Culture, blood (routine x 2)     Status: None   Collection Time: 12/17/15  2:41 PM  Result Value Ref Range Status   Specimen Description BLOOD LEFT HAND  Final   Special Requests IN PEDIATRIC BOTTLE [place]   Final   Culture   Final    NO GROWTH 5 DAYS Performed at [facility]    Report Status 12/22/2015 FINAL  Final  Culture, blood (routine x 2)     Status: None (Preliminary result)   Collection Time: 12/17/15 11:24 PM  Result Value Ref Range Status   Specimen Description BLOOD LEFT HAND  Final   Special Requests IN PEDIATRIC BOTTLE 3CC  Final   Culture   Final    NO GROWTH 4 DAYS Performed at [facility]    Report Status PENDING  Incomplete  Culture, body fluid-bottle     Status: None (Preliminary result)  Collection Time: 12/18/15  1:19 PM  Result Value Ref Range Status   Specimen Description FLUID PERITONEAL  Final   Special Requests NONE  Final   Culture   Final    NO GROWTH 4 DAYS Performed at [facility]    Report Status PENDING  Incomplete  Gram stain     Status: None   Collection Time: 12/18/15  1:19 PM  Result Value Ref Range Status   Specimen Description FLUID PERITONEAL  Final   Special Requests NONE  Final   Gram Stain   Final    FEW WBC PRESENT,BOTH PMN AND MONONUCLEAR NO ORGANISMS SEEN Performed at [facility]    Report Status 12/18/2015 FINAL  Final     Scheduled Meds: . atenolol  50 mg Oral Daily  . docusate sodium  100 mg Oral BID  . feeding supplement (ENSURE ENLIVE)  237 mL Oral BID BM  . fluconazole  100 mg Oral Daily  . folic acid  1 mg Oral Daily  . insulin aspart  0-9 Units Subcutaneous 6 times per day  . lipase/protease/amylase  36,000 Units Oral TID WC  . megestrol  200 mg Oral BID  . multivitamin with minerals  1 tablet Oral Daily  . sodium chloride flush  3 mL Intravenous Q12H  . thiamine  100 mg Oral Daily  . vitamin B-12  100 mcg Oral Daily   Continuous Infusions: . sodium chloride 30 mL/hr at 12/22/15 1956

## 2015-12-23 NOTE — Progress Notes (Signed)
Jacqueline Hahn is having some vomiting this morning. She denies much of appetite over the weekend.  her abdomen is a bit more distended. I think she is going for a peritoneal catheter insertion today. I am going to check her urine to make sure she does not have any urine infection. I will also check a abdominal film on her. I worried that she is developing pseudoobstruction from her malignancy. This is very common for pancreatic cancer.  Her labs look good. Her sodium is 136 potassium 3.7. Her white cell count is 9.4. Her hemoglobin is 12.1.  On exam, her blood pressure is 129/76. Her pulse is 86. Her temperature 90.8 degrees. Head exam shows no ocular or oral lesions per there are no palpable cervical or supraclavicular lymph nodes. Lungs are clear. Cardiac exam regular rate and rhythm. Abdomen is somewhat distended. She has some more fluid. She has decreased bowel sounds. I really cannot hear much in way of bowel sounds. There is no guarding or rebound tenderness. Is no abdominal mass. Is no palpable liver edge. Extremity shows no clubbing, cyanosis or edema.  I appreciate palliative care see her. We will get her ready for hospice when she is ready to go home.  I think an issue might be her be able to eat. It would not surprise me if she develops a pseudoobstruction.  We will continue to follow her along. She is well aware of the situation and the poor prognosis that faces her.  Alvy Beal 41:10

## 2015-12-24 LAB — GLUCOSE, CAPILLARY
GLUCOSE-CAPILLARY: 135 mg/dL — AB (ref 65–99)
Glucose-Capillary: 122 mg/dL — ABNORMAL HIGH (ref 65–99)
Glucose-Capillary: 151 mg/dL — ABNORMAL HIGH (ref 65–99)

## 2015-12-24 LAB — URINE CULTURE
Culture: NO GROWTH
Special Requests: NORMAL

## 2015-12-24 MED ORDER — DRONABINOL 2.5 MG PO CAPS
2.5000 mg | ORAL_CAPSULE | Freq: Three times a day (TID) | ORAL | Status: DC
  Administered 2015-12-24: 2.5 mg via ORAL
  Filled 2015-12-24: qty 1

## 2015-12-24 MED ORDER — TRAMADOL HCL 50 MG PO TABS: 50.0000 mg | ORAL_TABLET | Freq: Four times a day (QID) | ORAL | Status: AC | PRN

## 2015-12-24 MED ORDER — CYANOCOBALAMIN 100 MCG PO TABS: 100.0000 ug | ORAL_TABLET | Freq: Every day | ORAL | Status: AC

## 2015-12-24 MED ORDER — RIVAROXABAN 15 MG PO TABS: 15.0000 mg | ORAL_TABLET | Freq: Two times a day (BID) | ORAL | Status: AC

## 2015-12-24 MED ORDER — FOLIC ACID 1 MG PO TABS: 1.0000 mg | ORAL_TABLET | Freq: Every day | ORAL | Status: AC

## 2015-12-24 MED ORDER — FLUCONAZOLE 100 MG PO TABS: 100.0000 mg | ORAL_TABLET | Freq: Every day | ORAL | Status: AC

## 2015-12-24 MED ORDER — METOCLOPRAMIDE HCL 10 MG PO TABS
10.0000 mg | ORAL_TABLET | Freq: Three times a day (TID) | ORAL | Status: DC
  Administered 2015-12-24: 10 mg via ORAL
  Filled 2015-12-24: qty 1

## 2015-12-24 MED ORDER — RIVAROXABAN 20 MG PO TABS: 20.0000 mg | ORAL_TABLET | Freq: Every day | ORAL | Status: AC

## 2015-12-24 MED ORDER — HEPARIN SOD (PORK) LOCK FLUSH 100 UNIT/ML IV SOLN
500.0000 [IU] | INTRAVENOUS | Status: AC | PRN
  Administered 2015-12-24: 500 [IU]

## 2015-12-24 MED ORDER — ADULT MULTIVITAMIN W/MINERALS CH: 1.0000 | ORAL_TABLET | Freq: Every day | ORAL | Status: AC

## 2015-12-24 MED ORDER — POTASSIUM CHLORIDE CRYS ER 20 MEQ PO TBCR: 20.0000 meq | EXTENDED_RELEASE_TABLET | Freq: Every day | ORAL | Status: AC

## 2015-12-24 MED ORDER — DRONABINOL 2.5 MG PO CAPS: 2.5000 mg | ORAL_CAPSULE | Freq: Three times a day (TID) | ORAL | Status: AC

## 2015-12-24 MED ORDER — ENSURE ENLIVE PO LIQD: 237.0000 mL | Freq: Two times a day (BID) | ORAL | Status: AC

## 2015-12-24 MED ORDER — METOCLOPRAMIDE HCL 10 MG PO TABS: 10.0000 mg | ORAL_TABLET | Freq: Three times a day (TID) | ORAL | Status: AC

## 2015-12-24 MED ORDER — THIAMINE HCL 100 MG PO TABS: 100.0000 mg | ORAL_TABLET | Freq: Every day | ORAL | Status: AC

## 2015-12-24 NOTE — Progress Notes (Signed)
Jacqueline Hahn had her peritoneal catheter placed. 2.5 L of fluid was removed.  She is still not eating much. She still nauseated. A plain film of the abdomen did not show any obstruction. I do not find any bowel sounds on her physical exam. 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 This is a very difficult problem to overcome. I will try some Reglan.  She's had no fever. She's had no bleeding. She is out of bed a little bit.  She says she is probably going home today.  She had no lab work done today. Her labs this day looked okay.  Her vital signs look stable. Her blood pressure is 147 or 75. Her pulse is 102. Her temperature is 98.9. Her abdomen is soft. The per kilo catheter is in the right lower quadrant. Bowel sounds are not present. There is no distention. There is no guarding or rebound tenderness. Her lungs are clear. Her cardiac exam shows tachycardic rate is regular. Extremities shows no clubbing, cyanosis or edema.  Again, sounds like she might be going home today. If so, we will follow up with her in the office.  Pete E.  Hebrews 10:36

## 2015-12-24 NOTE — Care Management Important Message (Signed)
Important Message  Patient Details  Name: Jacqueline Hahn MRN: [id number] Date of Birth: Jan 14, 1946   Medicare Important Message Given:  Yes    Camillo Flaming 12/24/2015, 1:47 [place] Message  Patient Details  Name: Jacqueline Hahn MRN: [id number] Date of Birth: September 27, 1946   Medicare Important Message Given:  Yes    Camillo Flaming 12/24/2015, 1:47 PM

## 2015-12-24 NOTE — Discharge Summary (Signed)
Physician Discharge Summary  Jacqueline Hahn [id number] DOB: 1945/12/30 DOA: 12/17/2015  PCP: Annye Asa, MD  Admit date: 12/17/2015 Discharge date: 12/24/2015  Recommendations for Outpatient Follow-up:  1. Patient will take xarelto 50 mg twice a daily through 01/08/2016 and thereafter once a day 20 mg indefinitely 2. Catheter can be drained once a week for now.  Discharge Diagnoses:  Principal Problem:   DVT (deep venous thrombosis), left Active Problems:   Pancreatic cancer metastasized to lung (HCC)   AKI (acute kidney injury) (HCC)   Nausea and vomiting   Diabetes mellitus without complication (HCC)   Hypokalemia   Malnutrition of moderate degree   Acute deep vein thrombosis (DVT) of left femoral vein (HCC)   Ascites, malignant    Discharge Condition: stable   Diet recommendation: as tolerated   History of present illness:  70 y.o. female with advanced pancreatic cancer (diagnosed 07/2014) receiving chemo Rx who presented to [facility] ED with complaints of worsening abdominal pain and distention over past few days prior to this admission. She had CT abd which was significant for ascites and left femoral vein DVT. She underwent paracentesis with 4.2 L fluid removed. She was started on heparin drip for anticoagulation. Changed to xarelto 2/16.  Peritoneal catheter placement done 2/20. Drained 2.5 L fluid at that time as well.   Hospital Course:   Assessment/Plan:    Principal Problem:  DVT (deep venous thrombosis), left - In the setting of advanced pancreatic cancer - Heparin drip stopped 2/16 - Started xarelto 2/16 and so far no reports of bleeding  - She will take xarelto BID through 3/8 and then once a day from 3/9   Active Problems:  Abdominal distention secondary to malignant ascites in the setting of pancreatic cancer / Nausea and vomiting  - Underwent paracentesis 2/15 with 4.2 L fluids removed. Another 2.5 L drained at the time of catheter placement  12/23/2015. - Fluid cytology adenomacarcinoma  - Diet as tolerated - Catheter placed 2/20   Pancreatic cancer metastasized to lung [facility]) - Appreciate oncology following - Continue pancreatic lipase supplements  - Continue folic acid, [id number] , thiamine, MVI   Essential hypertension - Continue atenolol and Norvasc    Hypokalemia - Secondary to GI losses.  - Supplemented   Diabetes mellitus without complication without long term insulin use - Sliding scale insulin in hospital    Malnutrition of moderate degree - In the setting of chronic illness - Seen by nutritionist  - Continue nutritional supplementation  - Stopped megace due to risk of further blood clotting    Anemia of chronic disease - Secondary to history of malignancy - S/p 1 U PRBC 2/17 - Hemoglobin is stable   DVT Prophylaxis  - Started xarelto 2/16   Code Status: DNR/DNI Family Communication: plan of care discussed with the patient   IV access:  Peripheral IV  Procedures and diagnostic studies:   Ct Abdomen Pelvis W Contrast 12/17/2015 Significant ascites with omental infiltration as well as probable tumor adjacent to the hepatic flexure and gallbladder consistent with peritoneal carcinomatosis. Filling defect in LEFT common femoral vein consistent with deep venous thrombosis. Cannot exclude wall thickening at the gastric antrum/pylorus; this could be related to ulcer disease/gastritis, radiation therapy, or tumor. Two questionable tiny LEFT lower lobe pulmonary nodules.   US Paracentesis 12/18/2015 Successful ultrasound-guided diagnostic and therapeutic paracentesis yielding 4.2 liters liters of peritoneal fluid.   Dg Abd Acute W/chest 12/17/2015 Moderate stool burden. No evidence of bowel obstruction or free  air. Left basilar atelectasis or scarring. No active disease.   Medical Consultants:  IR  IAnti-Infectives:   None  Signed:  Leisa Lenz, MD  [facility] 12/24/2015, 10:28 AM  Pager #: [phone]  Time spent in minutes: more than 30 minutes   Discharge Exam: Filed Vitals:   12/23/15 2110 12/24/15 0510  BP: 138/68 147/75  Pulse: 103 102  Temp: 99.2 F (37.3 C) 98.9 F (37.2 C)  Resp: 18 20   Filed Vitals:   12/23/15 1425 12/23/15 2110 12/24/15 0510 12/24/15 0700  BP: 124/63 138/68 147/75   Pulse: 112 103 102   Temp: 97.9 F (36.6 C) 99.2 F (37.3 C) 98.9 F (37.2 C)   TempSrc: Oral Oral    Resp:  18 20   Height:      Weight:    49.8 kg (109 lb 12.6 oz)  SpO2: 100% 99%      General: Pt is alert, follows commands appropriately, not in acute distress Cardiovascular: Regular rate and rhythm, S1/S2 +, no murmurs Respiratory: Clear to auscultation bilaterally, no wheezing, no crackles, no rhonchi Abdominal: Softly distended, cath in place Extremities: no edema, no cyanosis, pulses palpable bilaterally DP and PT Neuro: Grossly nonfocal  Discharge Instructions  Discharge Instructions    Call MD for:  difficulty breathing, headache or visual disturbances    Complete by:  As directed      Call MD for:  persistant dizziness or light-headedness    Complete by:  As directed      Call MD for:  persistant nausea and vomiting    Complete by:  As directed      Call MD for:  severe uncontrolled pain    Complete by:  As directed      Diet - low sodium heart healthy    Complete by:  As directed      Discharge instructions    Complete by:  As directed   Home health order placed for help with Catheter drainage, it can be done once a week.     Increase activity slowly    Complete by:  As directed             Medication List    STOP taking these medications        amoxicillin-clavulanate 875-125 MG tablet  Commonly known as:  AUGMENTIN     lactulose 10 GM/15ML solution  Commonly known as:  CHRONULAC     loperamide 2 MG tablet  Commonly known as:  IMODIUM A-D     LORazepam 0.5 MG tablet  Commonly known  as:  ATIVAN     megestrol 400 MG/10ML suspension  Commonly known as:  MEGACE     naloxegol oxalate 25 MG Tabs tablet  Commonly known as:  MOVANTIK      TAKE these medications        amLODipine 10 MG tablet  Commonly known as:  NORVASC  Take 1 tablet (10 mg total) by mouth daily. 90 day supply     atenolol 50 MG tablet  Commonly known as:  TENORMIN  Take 1 tablet (50 mg total) by mouth daily.     cyanocobalamin 100 MCG tablet  Take 1 tablet (100 mcg total) by mouth daily.     cyproheptadine 4 MG tablet  Commonly known as:  PERIACTIN  Take 0.5 tablets (2 mg total) by mouth 3 (three) times daily as needed for allergies.     dexamethasone 4 MG tablet  Commonly known  as:  DECADRON  Take 2 tablets (8 mg total) by mouth 2 (two) times daily with a meal. Start the day after chemotherapy for 3 days. Take with food.     dronabinol 2.5 MG capsule  Commonly known as:  MARINOL  Take 1 capsule (2.5 mg total) by mouth 3 (three) times daily before meals.     feeding supplement (ENSURE ENLIVE) Liqd  Take 237 mLs by mouth 2 (two) times daily between meals.     fluconazole 100 MG tablet  Commonly known as:  DIFLUCAN  Take 1 tablet (100 mg total) by mouth daily.     folic acid 1 MG tablet  Commonly known as:  FOLVITE  Take 1 tablet (1 mg total) by mouth daily.     glipiZIDE-metformin 5-500 MG tablet  Commonly known as:  METAGLIP  Take 1 tablet by mouth 2 (two) times daily before a meal. 90 day supply.     l-methylfolate-B6-B12 3-35-2 MG Tabs tablet  Commonly known as:  METANX  Take 1 tablet by mouth daily.     lidocaine-prilocaine cream  Commonly known as:  EMLA  Apply 1 application topically as needed. Apply quarter sized amount to portacath site at least one hour prior to chemo treatment     LINZESS 145 MCG Caps capsule  Generic drug:  Linaclotide  Take 145 mcg by mouth daily as needed (constipation). Reported on 10/30/2015     lipase/protease/amylase 36000 UNITS Cpep  capsule  Commonly known as:  CREON  Take 1 capsule (36,000 Units total) by mouth 3 (three) times daily with meals.     metoCLOPramide 10 MG tablet  Commonly known as:  REGLAN  Take 1 tablet (10 mg total) by mouth 4 (four) times daily -  before meals and at bedtime.     multivitamin with minerals Tabs tablet  Take 1 tablet by mouth daily. Reported on 10/30/2015     multivitamin with minerals Tabs tablet  Take 1 tablet by mouth daily.     ondansetron 8 MG tablet  Commonly known as:  ZOFRAN  Take 1 tablet (8 mg total) by mouth 2 (two) times daily. Start the day after chemo for 3 days. Then take as needed for nausea or vomiting.     potassium chloride SA 20 MEQ tablet  Commonly known as:  K-DUR,KLOR-CON  Take 1 tablet (20 mEq total) by mouth daily. 90 day supply     prochlorperazine 10 MG tablet  Commonly known as:  COMPAZINE  Take 1 tablet (10 mg total) by mouth every 6 (six) hours as needed (Nausea or vomiting).     Rivaroxaban 15 MG Tabs tablet  Commonly known as:  XARELTO  Take 1 tablet (15 mg total) by mouth 2 (two) times daily with a meal.     rivaroxaban 20 MG Tabs tablet  Commonly known as:  XARELTO  Take 1 tablet (20 mg total) by mouth daily with supper.  Start taking on:  01/09/2016     thiamine 100 MG tablet  Take 1 tablet (100 mg total) by mouth daily.     traMADol 50 MG tablet  Commonly known as:  ULTRAM  Take 1 tablet (50 mg total) by mouth every 6 (six) hours as needed for moderate pain.           Follow-up Information    Follow up with Annye Asa, MD. Schedule an appointment as soon as possible for a visit in 1 week.   Specialty:  Family Medicine  Why:  Follow up appt after recent hospitalization   Contact information:   [place] [id number] [phone]        The results of significant diagnostics from this hospitalization (including imaging, microbiology, ancillary and laboratory) are listed below for  reference.    Significant Diagnostic Studies: Ct Abdomen Pelvis W Contrast  12/17/2015  CLINICAL DATA:  Pancreatic cancer, unable to keep solids and fluids down, irregular heart rate, fever subjectively, hypertension, diabetes mellitus, former smoker EXAM: CT ABDOMEN AND PELVIS WITH CONTRAST TECHNIQUE: Multidetector CT imaging of the abdomen and pelvis was performed using the standard protocol following bolus administration of intravenous contrast. Sagittal and coronal MPR images reconstructed from axial data set. CONTRAST:  33mL OMNIPAQUE IOHEXOL 300 MG/ML SOLN IV. Dilute oral contrast. COMPARISON:  None. FINDINGS: Two questionable tiny nodules at LEFT lower lobe. Biliary air consistent with CBD wall stent which is present. No intrahepatic biliary dilatation. Small subcapsular fluid collection at the RIGHT lobe of the liver, 3.4 x 1.9 cm image 19. Remainder of liver, spleen, kidneys, and adrenal glands normal appearance. Soft tissue nodularity adjacent to the gallbladder with thickening of the wall of the hepatic flexure compatible with tumor. Scattered mild infiltrative changes of the greater omentum compatible with tumor. Significant ascites throughout abdomen and pelvis. Peritoneal thickening in the lateral LEFT pelvis consistent with peritoneal carcinomatosis. Unremarkable bladder and ovaries. Small nodule at uterus 13 mm diameter question small leiomyoma. Atrophic pancreas with ductal dilatation at the body and tail. Large and small bowel loops normal. Questionable gastric antral and pyloric wall thickening. Thrombus within LEFT common femoral vein consistent with deep venous thrombosis. Remaining vascular structures appear grossly patent. No free intraperitoneal air or evidence of bowel obstruction. Osseous structures unremarkable. IMPRESSION: Significant ascites with omental infiltration as well as probable tumor adjacent to the hepatic flexure and gallbladder consistent with peritoneal carcinomatosis.  Filling defect in LEFT common femoral vein consistent with deep venous thrombosis. Cannot exclude wall thickening at the gastric antrum/pylorus; this could be related to ulcer disease/gastritis, radiation therapy, or tumor. Two questionable tiny LEFT lower lobe pulmonary nodules. Findings called to Quincy Carnes in ED on 12/17/2015 at 1620 hours. Electronically Signed   By: Lavonia Dana M.D.   On: 12/17/2015 16:20   US Paracentesis  12/18/2015  INDICATION: Metastatic pancreatic carcinoma, ascites. Request made for diagnostic and therapeutic paracentesis. EXAM: ULTRASOUND GUIDED DIAGNOSTIC AND THERAPEUTIC PARACENTESIS MEDICATIONS: None. COMPLICATIONS: None immediate. PROCEDURE: Informed written consent was obtained from the patient after a discussion of the risks, benefits and alternatives to treatment. A timeout was performed prior to the initiation of the procedure. Initial ultrasound scanning demonstrates a large amount of ascites within the right lower abdominal quadrant. The right lower abdomen was prepped and draped in the usual sterile fashion. 1% lidocaine was used for local anesthesia. Following this, a Yueh catheter was introduced. An ultrasound image was saved for documentation purposes. The paracentesis was performed. The catheter was removed and a dressing was applied. The patient tolerated the procedure well without immediate post procedural complication. FINDINGS: A total of approximately 4.2 liters of yellow fluid was removed. Samples were sent to the laboratory as requested by the clinical team. IMPRESSION: Successful ultrasound-guided diagnostic and therapeutic paracentesis yielding 4.2 liters liters of peritoneal fluid. Read by: Rowe Robert, PA-C Electronically Signed   By: Aletta Edouard M.D.   On: 12/18/2015 13:13   Ir Perc Athena Masse Perit Cath [facility]  12/23/2015  INDICATION: History of  pancreatic carcinoma, now with recurrent malignant ascites. Request made for placement of a tunneled  peritoneal drainage catheter for palliative purposes. EXAM: ULTRASOUND AND FLUOROSCOPIC GUIDED PLACEMENT OF TUNNELED PERITONEAL CATHETER COMPARISON:  CT of the chest, abdomen pelvis - 11/06/2015; ultrasound-guided paracentesis -12/18/2015 MEDICATIONS: Ancef 2 gm IV; Antibiotics were administered within 1 hour of skin puncture. CONTRAST:  None ANESTHESIA/SEDATION: Moderate (conscious) sedation was employed during this procedure. A total of Versed 4 mg and Fentanyl 100 mcg was administered intravenously. Moderate Sedation Time: 20 minutes. The patient's level of consciousness and vital signs were monitored continuously by radiology nursing throughout the procedure under my direct supervision. FLUOROSCOPY TIME:  24 seconds (5 mGy) COMPLICATIONS: None immediate. TECHNIQUE: Informed written consent was obtained from the patient after a discussion of the risks, benefits and alternatives to treatment. Questions regarding the procedure were encouraged and answered. A timeout was performed prior to the initiation of the procedure. Ultrasound scanning of the right lower abdominal quadrant demonstrates a moderate amount of complex fluid within the right lower abdominal quadrant. The right lower abdomen was prepped and draped usual sterile fashion a sterile drape was applied, covering the operative table. Maximum barrier sterile technique with sterile gowns and gloves were used for the procedure. A timeout was performed prior to the initiation of the procedure. Local anesthesia was provided with 1% lidocaine with epinephrine. Under direct ultrasound guidance, an 18 gauge trocar needle was advanced into the peritoneal space with the right abdominal quadrant. Appropriate positioning was confirmed with the efflux of ascites. An Amplatz super stiff wire was advanced across the abdomen under intermittent fluoroscopic guidance. The access site was dilated over the Amplatz wire, allowing advancement of a peel-away sheath. The  peritoneal catheter was tunneled in an antegrade fashion from a site along the right mid clavicular line to the access site and inserted through the peel-away sheath with tip ultimately terminating within left mid hemi abdomen. Several postprocedural spot abdominal radiographs were obtained. The catheter was connected to wall suction and approximately 2.5 L of serous ascites was aspirated. The access site was closed with an interrupted 4-0 Vicryl suture, Dermabond and Steri-Strips. Dressings were placed. The patient tolerated procedure well without immediate postprocedural complication. FINDINGS: After successful ultrasound and fluoroscopic guided peritoneal drainage catheter placement, the right lower quadrant abdominal approach drainage catheter terminates within the left mid hemi abdomen. Following tunneled peritoneal drainage catheter placement, approximately 2.5 L of serous fluid was aspirated. IMPRESSION: 1. Successful ultrasound and fluoroscopic guided placement of a tunneled peritoneal drainage catheter. 2. Successful aspiration of 2.5 L of serous ascites following tunneled peritoneal drainage catheter placement. Electronically Signed   By: Sandi Mariscal M.D.   On: 12/23/2015 15:31   Dg Abd 2 Views  12/23/2015  CLINICAL DATA:  Abdominal distension, history of pancreatic carcinoma EXAM: ABDOMEN - 2 VIEW COMPARISON:  12/17/2015 FINDINGS: Scattered large and small bowel gas is noted. No abnormal mass or abnormal calcifications are seen. A common bile duct stent is again noted and stable. Bibasilar atelectatic changes are noted. No free air is seen. No significant ascites is noted following paracentesis. IMPRESSION: No acute abnormality is noted. Electronically Signed   By: Inez Catalina M.D.   On: 12/23/2015 10:17   Dg Abd 2 Views  12/10/2015  CLINICAL DATA:  Metastatic pancreatic cancer. Vomiting and abdominal pain. Rule out bowel obstruction. EXAM: ABDOMEN - 2 VIEW COMPARISON:  CT abdomen 11/06/2015  FINDINGS: Common bile duct metal stent in satisfactory position. Moderate stool in the colon. Air-fluid  level in the hepatic flexure and transverse colon. Small air-fluid levels in the small bowel without dilatation. No free air on the upright view. Negative for urinary tract calculi.  No focal bony lesion. IMPRESSION: Air-fluid levels in large and small bowel without bowel dilatation. Probable mild ileus. Electronically Signed   By: Franchot Gallo M.D.   On: 12/10/2015 11:55   Dg Abd Acute W/chest  12/17/2015  CLINICAL DATA:  Back pain and right side pain since chemotherapy 2 weeks ago. Vomiting. EXAM: DG ABDOMEN ACUTE W/ 1V CHEST COMPARISON:  Chest CT 11/06/2015 FINDINGS: Right Port-A-Cath is in place with the tip in the right atrium. Linear densities at the left base, likely scarring or atelectasis. No effusions. Heart is normal size. Nonobstructive bowel gas pattern. Moderate stool in the colon. Metallic biliary stent in place. No organomegaly or free air. IMPRESSION: Moderate stool burden. No evidence of bowel obstruction or free air. Left basilar atelectasis or scarring.  No active disease. Electronically Signed   By: Rolm Baptise M.D.   On: 12/17/2015 13:22    Microbiology: Recent Results (from the past 240 hour(s))  Urine culture     Status: None   Collection Time: 12/17/15 12:45 PM  Result Value Ref Range Status   Specimen Description URINE, CLEAN CATCH  Final   Special Requests NONE  Final   Culture   Final    NO GROWTH 1 DAY Performed at [facility]    Report Status 12/18/2015 FINAL  Final  Culture, blood (routine x 2)     Status: None   Collection Time: 12/17/15  2:41 PM  Result Value Ref Range Status   Specimen Description BLOOD LEFT HAND  Final   Special Requests IN PEDIATRIC BOTTLE [place]  Final   Culture   Final    NO GROWTH 5 DAYS Performed at [facility]    Report Status 12/22/2015 FINAL  Final  Culture, blood (routine x 2)     Status: None   Collection  Time: 12/17/15 11:24 PM  Result Value Ref Range Status   Specimen Description BLOOD LEFT HAND  Final   Special Requests IN PEDIATRIC BOTTLE 3CC  Final   Culture   Final    NO GROWTH 5 DAYS Performed at [facility]    Report Status 12/23/2015 FINAL  Final  Culture, body fluid-bottle     Status: None   Collection Time: 12/18/15  1:19 PM  Result Value Ref Range Status   Specimen Description FLUID PERITONEAL  Final   Special Requests NONE  Final   Culture   Final    NO GROWTH 5 DAYS Performed at [facility]    Report Status 12/23/2015 FINAL  Final  Gram stain     Status: None   Collection Time: 12/18/15  1:19 PM  Result Value Ref Range Status   Specimen Description FLUID PERITONEAL  Final   Special Requests NONE  Final   Gram Stain   Final    FEW WBC PRESENT,BOTH PMN AND MONONUCLEAR NO ORGANISMS SEEN Performed at [facility]    Report Status 12/18/2015 FINAL  Final     Labs: Basic Metabolic Panel:  Recent Labs Lab 12/17/15 1441 12/18/15 0502 12/23/15 0459  NA 135 134* 136  K 3.4* 3.5 3.7  CL 99* 105 107  CO2 18* 18* 21*  GLUCOSE 113* 132* 110*  BUN 19 15 10   CREATININE 0.62 0.57 0.56  CALCIUM 8.7* 7.8* 8.4*  MG 1.6*  --   --  Liver Function Tests:  Recent Labs Lab 12/17/15 1441  AST 34  ALT 33  ALKPHOS 302*  BILITOT 0.8  PROT 6.9  ALBUMIN 2.9*    Recent Labs Lab 12/17/15 1441  LIPASE 12   No results for input(s): AMMONIA in the last 168 hours. CBC:  Recent Labs Lab 12/17/15 1441  12/19/15 0441 12/20/15 0502 12/21/15 0451 12/22/15 0454 12/23/15 0459  WBC 3.8*  < > 4.1 4.2 6.1 7.6 9.4  NEUTROABS 1.5*  --   --   --   --   --   --   HGB 12.5  < > 10.4* 8.8* 11.9* 11.3* 12.1  HCT 38.5  < > 32.6* 27.3* 37.2 34.5* 37.4  MCV 72.4*  < > 72.9* 72.0* 75.0* 73.7* 74.7*  PLT 372  < > 449* 387 520* 505* 594*  < > = values in this interval not displayed. Cardiac Enzymes: No results for input(s): CKTOTAL, CKMB, CKMBINDEX,  TROPONINI in the last 168 hours. BNP: BNP (last 3 results) No results for input(s): BNP in the last 8760 hours.  ProBNP (last 3 results) No results for input(s): PROBNP in the last 8760 hours.  CBG:  Recent Labs Lab 12/23/15 1635 12/23/15 1938 12/23/15 2351 12/24/15 0410 12/24/15 0815  GLUCAP 101* 162* 106* 135* 122*

## 2015-12-24 NOTE — Progress Notes (Signed)
Patient ID: Jacqueline Hahn, female   DOB: 1946/09/12, 70 y.o.   MRN: [id number]    Referring Physician(s): Burney Gauze  Chief Complaint: Met pancreatic cancer with ascites  Subjective: Patient doing well today.  No new complaints.  Eating breakfast  Allergies: Review of patient's allergies indicates no known allergies.  Medications: Prior to Admission medications   Medication Sig Start Date End Date Taking? Authorizing Provider  amLODipine (NORVASC) 10 MG tablet Take 1 tablet (10 mg total) by mouth daily. 90 day supply 11/20/15  Yes [name], MD  atenolol (TENORMIN) 50 MG tablet Take 1 tablet (50 mg total) by mouth daily. 12/04/15  Yes [name], MD  cyproheptadine (PERIACTIN) 4 MG tablet Take 0.5 tablets (2 mg total) by mouth 3 (three) times daily as needed for allergies. 10/30/15  Yes Eliezer Bottom, NP  dexamethasone (DECADRON) 4 MG tablet Take 2 tablets (8 mg total) by mouth 2 (two) times daily with a meal. Start the day after chemotherapy for 3 days. Take with food. 12/04/15  Yes [name], MD  glipiZIDE-metformin (METAGLIP) 5-500 MG tablet Take 1 tablet by mouth 2 (two) times daily before a meal. 90 day supply. 11/20/15  Yes [name], MD  l-methylfolate-B6-B12 (METANX) 3-35-2 MG TABS Take 1 tablet by mouth daily. 04/22/15  Yes [name], MD  lidocaine-prilocaine (EMLA) cream Apply 1 application topically as needed. Apply quarter sized amount to portacath site at least one hour prior to chemo treatment 07/10/14  Yes [name], MD  LINZESS 145 MCG CAPS capsule Take 145 mcg by mouth daily as needed (constipation). Reported on 10/30/2015 08/15/15  Yes Historical Provider, MD  lipase/protease/amylase (CREON) 36000 UNITS CPEP capsule Take 1 capsule (36,000 Units total) by mouth 3 (three) times daily with meals. 11/14/15  Yes Eliezer Bottom, NP  megestrol (MEGACE) 400 MG/10ML suspension Take 800 mg by mouth daily as needed (only uses when stomach is upset or  feels stomach "needs" it).    Yes Historical Provider, MD  Multiple Vitamin (MULTIVITAMIN WITH MINERALS) TABS tablet Take 1 tablet by mouth daily. Reported on 10/30/2015   Yes Historical Provider, MD  ondansetron (ZOFRAN) 8 MG tablet Take 1 tablet (8 mg total) by mouth 2 (two) times daily. Start the day after chemo for 3 days. Then take as needed for nausea or vomiting. 12/05/15  Yes [name], MD  prochlorperazine (COMPAZINE) 10 MG tablet Take 1 tablet (10 mg total) by mouth every 6 (six) hours as needed (Nausea or vomiting). 12/05/15  Yes [name], MD  traMADol (ULTRAM) 50 MG tablet Take 1-2 tablets (50-100 mg total) by mouth every 8 (eight) hours as needed for moderate pain. 10/15/15  Yes [name], MD  amoxicillin-clavulanate (AUGMENTIN) 875-125 MG tablet Take 1 tablet by mouth 2 (two) times daily. Patient not taking: Reported on 12/17/2015 12/02/15   Eliezer Bottom, NP  dronabinol (MARINOL) 2.5 MG capsule Take 1 capsule (2.5 mg total) by mouth 3 (three) times daily before meals. 12/24/15   Robbie Lis, MD  feeding supplement, ENSURE ENLIVE, (ENSURE ENLIVE) LIQD Take 237 mLs by mouth 2 (two) times daily between meals. 12/24/15   Robbie Lis, MD  fluconazole (DIFLUCAN) 100 MG tablet Take 1 tablet (100 mg total) by mouth daily. 12/24/15   Robbie Lis, MD  folic acid (FOLVITE) 1 MG tablet Take 1 tablet (1 mg total) by mouth daily. 12/24/15   Robbie Lis, MD  lactulose (CHRONULAC) 10 GM/15ML solution Take 30 mLs (20 g total) by mouth 3 (three) times daily. Patient not taking: Reported on 12/17/2015 07/04/15   [name], MD  loperamide (IMODIUM A-D) 2 MG tablet Take 2 at onset of diarrhea, then 1 every 2hrs until 12hr without a BM. May take 2 tab every 4hrs at bedtime. If diarrhea recurs repeat. Patient not taking: Reported on 12/17/2015 12/04/15   [name], MD  LORazepam (ATIVAN) 0.5 MG tablet Take 1 tablet (0.5 mg total) by mouth every 6 (six) hours as needed for  anxiety. Patient not taking: Reported on 12/17/2015 12/04/15   [name], MD  metoCLOPramide (REGLAN) 10 MG tablet Take 1 tablet (10 mg total) by mouth 4 (four) times daily. Patient not taking: Reported on 12/17/2015 12/10/15   [name], MD  metoCLOPramide (REGLAN) 10 MG tablet Take 1 tablet (10 mg total) by mouth 4 (four) times daily -  before meals and at bedtime. 12/24/15   Robbie Lis, MD  Multiple Vitamin (MULTIVITAMIN WITH MINERALS) TABS tablet Take 1 tablet by mouth daily. 12/24/15   Robbie Lis, MD  naloxegol oxalate (MOVANTIK) 25 MG TABS tablet Take 1 tablet (25 mg total) by mouth daily. Patient not taking: Reported on 12/17/2015 07/01/15   [name], MD  potassium chloride SA (K-DUR,KLOR-CON) 20 MEQ tablet Take 1 tablet (20 mEq total) by mouth daily. 90 day supply 12/24/15   Robbie Lis, MD  Rivaroxaban (XARELTO) 15 MG TABS tablet Take 1 tablet (15 mg total) by mouth 2 (two) times daily with a meal. 12/24/15 01/08/16  Robbie Lis, MD  rivaroxaban (XARELTO) 20 MG TABS tablet Take 1 tablet (20 mg total) by mouth daily with supper. 01/09/16   Robbie Lis, MD  thiamine 100 MG tablet Take 1 tablet (100 mg total) by mouth daily. 12/24/15   Robbie Lis, MD  traMADol (ULTRAM) 50 MG tablet Take 1 tablet (50 mg total) by mouth every 6 (six) hours as needed for moderate pain. 12/24/15   Robbie Lis, MD  vitamin B-12 100 MCG tablet Take 1 tablet (100 mcg total) by mouth daily. 12/24/15   Robbie Lis, MD    Vital Signs: BP 147/75 mmHg  Pulse 102  Temp(Src) 98.9 F (37.2 C) (Oral)  Resp 20  Ht 5' (1.524 m)  Wt 109 lb 12.6 oz (49.8 kg)  BMI 21.44 kg/m2  SpO2 99%  Physical Exam: Abd: soft, NT around drain, drain in place and site is c/d/i and end is capped off  Imaging: Ir Perc Athena Masse Perit Cath [facility]  12/23/2015  INDICATION: History of pancreatic carcinoma, now with recurrent malignant ascites. Request made for placement of a tunneled peritoneal drainage catheter for  palliative purposes. EXAM: ULTRASOUND AND FLUOROSCOPIC GUIDED PLACEMENT OF TUNNELED PERITONEAL CATHETER COMPARISON:  CT of the chest, abdomen pelvis - 11/06/2015; ultrasound-guided paracentesis -12/18/2015 MEDICATIONS: Ancef 2 gm IV; Antibiotics were administered within 1 hour of skin puncture. CONTRAST:  None ANESTHESIA/SEDATION: Moderate (conscious) sedation was employed during this procedure. A total of Versed 4 mg and Fentanyl 100 mcg was administered intravenously. Moderate Sedation Time: 20 minutes. The patient's level of consciousness and vital signs were monitored continuously by radiology nursing throughout the procedure under my direct supervision. FLUOROSCOPY TIME:  24 seconds (5 mGy) COMPLICATIONS: None immediate. TECHNIQUE: Informed written consent was obtained from the patient after a discussion of the risks, benefits and alternatives to treatment. Questions regarding the procedure  were encouraged and answered. A timeout was performed prior to the initiation of the procedure. Ultrasound scanning of the right lower abdominal quadrant demonstrates a moderate amount of complex fluid within the right lower abdominal quadrant. The right lower abdomen was prepped and draped usual sterile fashion a sterile drape was applied, covering the operative table. Maximum barrier sterile technique with sterile gowns and gloves were used for the procedure. A timeout was performed prior to the initiation of the procedure. Local anesthesia was provided with 1% lidocaine with epinephrine. Under direct ultrasound guidance, an 18 gauge trocar needle was advanced into the peritoneal space with the right abdominal quadrant. Appropriate positioning was confirmed with the efflux of ascites. An Amplatz super stiff wire was advanced across the abdomen under intermittent fluoroscopic guidance. The access site was dilated over the Amplatz wire, allowing advancement of a peel-away sheath. The peritoneal catheter was tunneled in an  antegrade fashion from a site along the right mid clavicular line to the access site and inserted through the peel-away sheath with tip ultimately terminating within left mid hemi abdomen. Several postprocedural spot abdominal radiographs were obtained. The catheter was connected to wall suction and approximately 2.5 L of serous ascites was aspirated. The access site was closed with an interrupted 4-0 Vicryl suture, Dermabond and Steri-Strips. Dressings were placed. The patient tolerated procedure well without immediate postprocedural complication. FINDINGS: After successful ultrasound and fluoroscopic guided peritoneal drainage catheter placement, the right lower quadrant abdominal approach drainage catheter terminates within the left mid hemi abdomen. Following tunneled peritoneal drainage catheter placement, approximately 2.5 L of serous fluid was aspirated. IMPRESSION: 1. Successful ultrasound and fluoroscopic guided placement of a tunneled peritoneal drainage catheter. 2. Successful aspiration of 2.5 L of serous ascites following tunneled peritoneal drainage catheter placement. Electronically Signed   By: Sandi Mariscal M.D.   On: 12/23/2015 15:31   Dg Abd 2 Views  12/23/2015  CLINICAL DATA:  Abdominal distension, history of pancreatic carcinoma EXAM: ABDOMEN - 2 VIEW COMPARISON:  12/17/2015 FINDINGS: Scattered large and small bowel gas is noted. No abnormal mass or abnormal calcifications are seen. A common bile duct stent is again noted and stable. Bibasilar atelectatic changes are noted. No free air is seen. No significant ascites is noted following paracentesis. IMPRESSION: No acute abnormality is noted. Electronically Signed   By: Inez Catalina M.D.   On: 12/23/2015 10:17    Labs:  CBC:  Recent Labs  12/20/15 0502 12/21/15 0451 12/22/15 0454 12/23/15 0459  WBC 4.2 6.1 7.6 9.4  HGB 8.8* 11.9* 11.3* 12.1  HCT 27.3* 37.2 34.5* 37.4  PLT 387 520* 505* 594*    COAGS:  Recent Labs   04/20/15 0706 12/17/15 1715  INR 1.16 1.79*  APTT  --  104*    BMP:  Recent Labs  04/22/15 0425  10/30/15 0822  12/10/15 1150 12/17/15 1441 12/18/15 0502 12/23/15 0459  NA 135  < > 141  < > 136 135 134* 136  K 3.7  < > 3.1*  < > 3.6 3.4* 3.5 3.7  CL 105  < > 109*  --   --  99* 105 107  CO2 21*  < > 25  < > 22 18* 18* 21*  GLUCOSE 262*  < > 75  < > 156* 113* 132* 110*  BUN 13  < > 12  < > 13._0 CALCIUM 9.3  < > 9.8  < > 8.6 8.7* 7.8* 8.4*  CREATININE 0.65  < >  0.8  < > 0.6 0.62 0.57 0.56  GFRNONAA >60  --   --   --   --  >60 >60 >60  GFRAA >60  --   --   --   --  >60 >60 >60  < > = values in this interval not displayed.  LIVER FUNCTION TESTS:  Recent Labs  12/02/15 1119 12/04/15 0813 12/10/15 1150 12/17/15 1441  BILITOT 0.41 0.39 0.58 0.8  AST 33 27 54* 34  ALT 32 26 66* 33  ALKPHOS 260* 210* 291* 302*  PROT 7.5 7.1 6.4 6.9  ALBUMIN 2.7* 2.5* 2.5* 2.9*    Assessment and Plan: 1. Metastatic pancreatic cancer with malignant ascites, s/p placement of a tunneled pleurex cath, Dr. Pascal Lux 2/20 -site is clean and looks good. -will give her the pleurex catheter packet for home use instructions.  Electronically Signed: Henreitta Cea 12/24/2015, 9:22 AM   I spent a total of 15 Minutes at the the patient's bedside AND on the patient's hospital floor or unit, greater than 50% of which was counseling/coordinating care for metastatic pancreatic cancer with malignant ascites

## 2015-12-24 NOTE — Progress Notes (Signed)
Discharge instructions reviewed with patient and son. Prescriptions given. Questions answered. Donne Hazel, RN

## 2015-12-25 ENCOUNTER — Telehealth: Payer: Self-pay | Admitting: Hematology & Oncology

## 2015-12-25 ENCOUNTER — Encounter: Payer: Self-pay | Admitting: Hematology & Oncology

## 2015-12-25 ENCOUNTER — Telehealth: Payer: Self-pay | Admitting: Behavioral Health

## 2015-12-25 NOTE — Telephone Encounter (Signed)
[name]:[id number] Visits: 12 Status: Approved Dates: 12/10/2015 - 06/09/2016  Chemo: [id number] - Saline [id number] - Ketorolac [id number] - Metoclopramide [id number] - Herparin     COPY SCANNED

## 2015-12-25 NOTE — Telephone Encounter (Signed)
Transition Care Management Follow-up Telephone Call  PCP: Annye Asa, MD  Admit date: 12/17/2015 Discharge date: 12/24/2015  Recommendations for Outpatient Follow-up:  1. Patient will take xarelto 50 mg twice a daily through 01/08/2016 and thereafter once a day 20 mg indefinitely 2. Catheter can be drained once a week for now.  Discharge Diagnoses:  Principal Problem:  DVT (deep venous thrombosis), left Active Problems:  Pancreatic cancer metastasized to lung (HCC)  AKI (acute kidney injury) (HCC)  Nausea and vomiting  Diabetes mellitus without complication (HCC)  Hypokalemia  Malnutrition of moderate degree  Acute deep vein thrombosis (DVT) of left femoral vein (HCC)  Ascites, malignant  Discharge Condition: stable    How have you been since you were released from the hospital? Patient stated, "I was in a lot of pain last night and still having pain this morning as well."    Do you understand why you were in the hospital? yes   Do you understand the discharge instructions? Patient voiced that she's been feeling so bad, until she really has not had the opportunity to look over her discharge instructions. However, she believes that she understands the information told to her in the hospital.   Where were you discharged to? Home   Items Reviewed:  Medications reviewed: Attempted to review medications with the patient, but she informed the writer that she hasn't taken the time to review any changes made in the hospital due to the pain she's experiencing at this time.  Allergies reviewed: yes, NKA  Dietary changes reviewed: yes, as tolerated  Referrals reviewed: patient verbalized that no referrals were made in the hospital.   Functional Questionnaire:   Activities of Daily Living (ADLs):   She states they are independent in the following: Patient stated, "I'm not sure what I can do at this time because I have not gotten out the bed since coming home  from the hospital." States they require assistance with the following: Also, she voiced that if she needs any assistance her son is home with her to help out in any way.   Any transportation issues/concerns?: no   Any patient concerns? no   Confirmed importance and date/time of follow-up visits scheduled yes, 01/03/16 at 11:00 AM.  Provider Appointment booked with Dr. Birdie Riddle.  Confirmed with patient if condition begins to worsen call PCP or go to the ER.  Patient was given the office number and encouraged to call back with question or concerns.  : yes

## 2016-01-01 ENCOUNTER — Ambulatory Visit: Payer: Commercial Managed Care - HMO | Admitting: Hematology & Oncology

## 2016-01-01 ENCOUNTER — Other Ambulatory Visit: Payer: Commercial Managed Care - HMO

## 2016-01-01 ENCOUNTER — Ambulatory Visit: Payer: Commercial Managed Care - HMO

## 2016-01-02 ENCOUNTER — Telehealth: Payer: Self-pay | Admitting: Family Medicine

## 2016-01-02 NOTE — Telephone Encounter (Signed)
Called to conf appt - pt states hospice is coming to home tomorrow and she cannot keep appt

## 2016-01-03 ENCOUNTER — Inpatient Hospital Stay: Payer: Commercial Managed Care - HMO | Admitting: Family Medicine

## 2016-01-09 ENCOUNTER — Telehealth: Payer: Self-pay | Admitting: *Deleted

## 2016-01-09 NOTE — Telephone Encounter (Signed)
Received notification from [facility] of patient's death.  2016-01-29 at 7:42pm  Dr Marin Olp aware.

## 2016-01-15 ENCOUNTER — Ambulatory Visit: Payer: Commercial Managed Care - HMO

## 2016-01-15 ENCOUNTER — Other Ambulatory Visit: Payer: Commercial Managed Care - HMO

## 2016-01-15 ENCOUNTER — Ambulatory Visit: Payer: Commercial Managed Care - HMO | Admitting: Hematology & Oncology

## 2016-02-01 DEATH — deceased

## 2017-02-15 ENCOUNTER — Other Ambulatory Visit: Payer: Self-pay | Admitting: Nurse Practitioner
